# Patient Record
Sex: Female | Born: 1970 | Race: White | Hispanic: No | Marital: Married | Smoking: Former smoker
Health system: Southern US, Community
[De-identification: ages and names within clinical notes are randomized; demographics above are authoritative.]

## PROBLEM LIST (undated history)

## (undated) DIAGNOSIS — E669 Obesity, unspecified: Secondary | ICD-10-CM

## (undated) DIAGNOSIS — J329 Chronic sinusitis, unspecified: Secondary | ICD-10-CM

## (undated) DIAGNOSIS — M25511 Pain in right shoulder: Secondary | ICD-10-CM

## (undated) DIAGNOSIS — F32A Depression, unspecified: Secondary | ICD-10-CM

## (undated) DIAGNOSIS — F419 Anxiety disorder, unspecified: Secondary | ICD-10-CM

## (undated) DIAGNOSIS — M2550 Pain in unspecified joint: Secondary | ICD-10-CM

## (undated) DIAGNOSIS — J209 Acute bronchitis, unspecified: Principal | ICD-10-CM

## (undated) DIAGNOSIS — T7840XA Allergy, unspecified, initial encounter: Secondary | ICD-10-CM

## (undated) DIAGNOSIS — F329 Major depressive disorder, single episode, unspecified: Secondary | ICD-10-CM

## (undated) DIAGNOSIS — K5900 Constipation, unspecified: Secondary | ICD-10-CM

## (undated) DIAGNOSIS — Z8719 Personal history of other diseases of the digestive system: Secondary | ICD-10-CM

## (undated) DIAGNOSIS — Z9289 Personal history of other medical treatment: Secondary | ICD-10-CM

## (undated) DIAGNOSIS — G4700 Insomnia, unspecified: Secondary | ICD-10-CM

## (undated) DIAGNOSIS — R519 Headache, unspecified: Secondary | ICD-10-CM

## (undated) DIAGNOSIS — K219 Gastro-esophageal reflux disease without esophagitis: Secondary | ICD-10-CM

## (undated) DIAGNOSIS — D509 Iron deficiency anemia, unspecified: Secondary | ICD-10-CM

## (undated) DIAGNOSIS — Z98811 Dental restoration status: Secondary | ICD-10-CM

## (undated) DIAGNOSIS — I10 Essential (primary) hypertension: Secondary | ICD-10-CM

## (undated) DIAGNOSIS — M549 Dorsalgia, unspecified: Secondary | ICD-10-CM

## (undated) DIAGNOSIS — Z8632 Personal history of gestational diabetes: Secondary | ICD-10-CM

## (undated) DIAGNOSIS — R002 Palpitations: Secondary | ICD-10-CM

## (undated) DIAGNOSIS — R51 Headache: Secondary | ICD-10-CM

## (undated) DIAGNOSIS — F418 Other specified anxiety disorders: Secondary | ICD-10-CM

## (undated) DIAGNOSIS — J45909 Unspecified asthma, uncomplicated: Secondary | ICD-10-CM

## (undated) DIAGNOSIS — R600 Localized edema: Secondary | ICD-10-CM

## (undated) DIAGNOSIS — R739 Hyperglycemia, unspecified: Secondary | ICD-10-CM

## (undated) DIAGNOSIS — E282 Polycystic ovarian syndrome: Secondary | ICD-10-CM

## (undated) HISTORY — DX: Gastro-esophageal reflux disease without esophagitis: K21.9

## (undated) HISTORY — DX: Personal history of gestational diabetes: Z86.32

## (undated) HISTORY — DX: Allergy, unspecified, initial encounter: T78.40XA

## (undated) HISTORY — DX: Anxiety disorder, unspecified: F41.9

## (undated) HISTORY — DX: Obesity, unspecified: E66.9

## (undated) HISTORY — DX: Constipation, unspecified: K59.00

## (undated) HISTORY — DX: Hyperglycemia, unspecified: R73.9

## (undated) HISTORY — DX: Dorsalgia, unspecified: M54.9

## (undated) HISTORY — DX: Depression, unspecified: F32.A

## (undated) HISTORY — DX: Other specified anxiety disorders: F41.8

## (undated) HISTORY — PX: COLONOSCOPY: SHX174

## (undated) HISTORY — DX: Insomnia, unspecified: G47.00

## (undated) HISTORY — DX: Pain in right shoulder: M25.511

## (undated) HISTORY — DX: Unspecified asthma, uncomplicated: J45.909

## (undated) HISTORY — DX: Localized edema: R60.0

## (undated) HISTORY — DX: Pain in unspecified joint: M25.50

## (undated) HISTORY — DX: Polycystic ovarian syndrome: E28.2

## (undated) HISTORY — DX: Major depressive disorder, single episode, unspecified: F32.9

## (undated) HISTORY — PX: UPPER GI ENDOSCOPY: SHX6162

## (undated) HISTORY — PX: WISDOM TOOTH EXTRACTION: SHX21

## (undated) HISTORY — DX: Acute bronchitis, unspecified: J20.9

## (undated) HISTORY — DX: Palpitations: R00.2

---
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
## 1999-06-09 ENCOUNTER — Emergency Department (HOSPITAL_COMMUNITY): Admission: EM | Admit: 1999-06-09 | Discharge: 1999-06-09 | Payer: Self-pay | Admitting: Emergency Medicine

## 2000-07-19 ENCOUNTER — Emergency Department (HOSPITAL_COMMUNITY): Admission: EM | Admit: 2000-07-19 | Discharge: 2000-07-19 | Payer: Self-pay | Admitting: Emergency Medicine

## 2000-07-26 ENCOUNTER — Encounter: Admission: RE | Admit: 2000-07-26 | Discharge: 2000-08-23 | Payer: Self-pay | Admitting: Occupational Medicine

## 2003-11-19 ENCOUNTER — Other Ambulatory Visit: Admission: RE | Admit: 2003-11-19 | Discharge: 2003-11-19 | Payer: Self-pay | Admitting: Gynecology

## 2004-04-09 ENCOUNTER — Other Ambulatory Visit: Admission: RE | Admit: 2004-04-09 | Discharge: 2004-04-09 | Payer: Self-pay | Admitting: Gynecology

## 2004-08-12 ENCOUNTER — Encounter: Admission: RE | Admit: 2004-08-12 | Discharge: 2004-08-12 | Payer: Self-pay | Admitting: Gynecology

## 2004-10-23 ENCOUNTER — Inpatient Hospital Stay (HOSPITAL_COMMUNITY): Admission: AD | Admit: 2004-10-23 | Discharge: 2004-10-25 | Payer: Self-pay | Admitting: Gynecology

## 2004-10-26 ENCOUNTER — Encounter: Admission: RE | Admit: 2004-10-26 | Discharge: 2004-11-25 | Payer: Self-pay | Admitting: Gynecology

## 2004-12-04 ENCOUNTER — Other Ambulatory Visit: Admission: RE | Admit: 2004-12-04 | Discharge: 2004-12-04 | Payer: Self-pay | Admitting: Gynecology

## 2004-12-26 ENCOUNTER — Encounter: Admission: RE | Admit: 2004-12-26 | Discharge: 2005-01-25 | Payer: Self-pay | Admitting: Gynecology

## 2006-02-09 ENCOUNTER — Emergency Department (HOSPITAL_COMMUNITY): Admission: EM | Admit: 2006-02-09 | Discharge: 2006-02-09 | Payer: Self-pay | Admitting: Family Medicine

## 2006-03-30 ENCOUNTER — Other Ambulatory Visit: Admission: RE | Admit: 2006-03-30 | Discharge: 2006-03-30 | Payer: Self-pay | Admitting: Gynecology

## 2007-05-09 ENCOUNTER — Other Ambulatory Visit: Admission: RE | Admit: 2007-05-09 | Discharge: 2007-05-09 | Payer: Self-pay | Admitting: Gynecology

## 2008-01-06 ENCOUNTER — Inpatient Hospital Stay (HOSPITAL_COMMUNITY): Admission: AD | Admit: 2008-01-06 | Discharge: 2008-01-06 | Payer: Self-pay | Admitting: Obstetrics and Gynecology

## 2008-01-12 ENCOUNTER — Inpatient Hospital Stay (HOSPITAL_COMMUNITY): Admission: AD | Admit: 2008-01-12 | Discharge: 2008-01-14 | Payer: Self-pay | Admitting: Obstetrics and Gynecology

## 2008-01-18 ENCOUNTER — Encounter: Admission: RE | Admit: 2008-01-18 | Discharge: 2008-02-16 | Payer: Self-pay | Admitting: Obstetrics and Gynecology

## 2008-02-17 ENCOUNTER — Encounter: Admission: RE | Admit: 2008-02-17 | Discharge: 2008-02-23 | Payer: Self-pay | Admitting: Obstetrics and Gynecology

## 2008-10-21 ENCOUNTER — Emergency Department (HOSPITAL_COMMUNITY): Admission: EM | Admit: 2008-10-21 | Discharge: 2008-10-21 | Payer: Self-pay | Admitting: Family Medicine

## 2010-09-25 LAB — HM PAP SMEAR

## 2010-09-25 LAB — HM MAMMOGRAPHY

## 2010-11-06 LAB — POCT RAPID STREP A (OFFICE): Streptococcus, Group A Screen (Direct): NEGATIVE

## 2010-12-12 NOTE — H&P (Signed)
Connie Blanchard, Connie Blanchard             ACCOUNT NO.:  [id number]   MEDICAL RECORD NO.:  [id number]          PATIENT TYPE:  INP   LOCATION:  9171                          FACILITY:  [facility]   PHYSICIAN:  Juan H. Lily Peer, M.D.DATE OF BIRTH:  08-11-1970   DATE OF ADMISSION:  10/23/2004  DATE OF DISCHARGE:                                HISTORY & PHYSICAL   CHIEF COMPLAINT:  Ruptured membranes.   HISTORY:  The patient is a 40 year old gravida 2 para 0 AB 1; estimated date  of confinement of October 26, 2004; currently 39-and-a-half weeks estimated  gestational age. Presented to [facility] Gynecology office this morning  saying that she had rupture of membranes at approximately 9 a.m. this  morning, has had irregular contractions since the previous evening. Sterile  speculum examination demonstrated gross rupture of membranes, clear. Cervix  3 cm, 90% effaced, -3 station, and vertex. Review of the patient's prenatal  history demonstrated that due to her advanced maternal age she underwent a  genetic amniocentesis whereby normal chromosome studies were reported. The  patient did not want another assessment of the fetus until the time of the  delivery, but there was no chromosomal abnormality and her aminotic fluid  alpha-fetoprotein was reported to be normal as well. She had a urinary tract  infection early in her pregnancy and in her second half of pregnancy she had  an upper respiratory tract infection. She had declined cystic fibrosis  screening and she was treated for gestational diabetes on diet and was  followed with regular office visits and monitoring of her blood sugars and  her ultrasound screen had [redacted] weeks gestation and was reported to be normal  with no gross abnormality.   PAST MEDICAL HISTORY:  She is allergic to PENICILLIN and SULFA. Gestational  diabetes, diet controlled this pregnancy. She denied any other medical  problems with the exception that she has had history of genital  warts in the  past.   REVIEW OF SYSTEMS:  See [place] form.   PHYSICAL EXAMINATION:  VITAL SIGNS:  Blood pressure today was 120/70, urine  was negative for protein and glucose. Her weight was 230 pounds.  HEENT:  Unremarkable.  NECK:  Supple, trachea midline. No carotid bruits, no thyromegaly.  LUNGS:  Clear to auscultation without rhonchi or wheezes.  HEART:  Regular rate and rhythm. No murmurs or gallops.  BREAST:  Exam not done.  ABDOMEN:  Gravid uterus, vertex presentation by [employer]. Fundal  height 38.5 cm.  PELVIC:  Cervix 3 cm, 90% effaced, -2 to -3 station with gross rupture of  membranes and clear.  EXTREMITIES:  Trace edema. DTRs 1+, negative clonus.   PRENATAL LABORATORY DATA:  A positive blood type, negative antibody screen.  VDRL was nonreactive. Rubella immune. Hepatitis B surface antigen and HIV  were negative. Alpha-fetoprotein was normal and Pap smear was normal. GBS  positive. Abnormal diabetes screen and three-hour GTT.   ASSESSMENT:  A 40 year old gravida 2 para 0 abortus 1 at 39-and-a-half weeks  estimated gestational age with gross rupture of membranes at 9 a.m. this  morning. The patient with  positive GBS and allergic to PENICILLIN. She will  be placed on clindamycin per protocol. Will augment with Pitocin as  clinically indicated.   PLAN:  Admit as per above.      JHF/MEDQ  D:  10/23/2004  T:  10/23/2004  Job:  [id number]

## 2011-04-23 LAB — CBC
HCT: 30.3 — ABNORMAL LOW
Hemoglobin: 10.2 — ABNORMAL LOW
MCHC: 34.4
Platelets: 193
RBC: 3.43 — ABNORMAL LOW
RDW: 16.7 — ABNORMAL HIGH
RDW: 16.8 — ABNORMAL HIGH
WBC: 7.3

## 2011-04-23 LAB — RPR: RPR Ser Ql: NONREACTIVE

## 2011-05-06 ENCOUNTER — Ambulatory Visit: Payer: Self-pay | Admitting: Family Medicine

## 2011-07-29 ENCOUNTER — Encounter: Payer: Self-pay | Admitting: Family Medicine

## 2011-07-29 ENCOUNTER — Ambulatory Visit (INDEPENDENT_AMBULATORY_CARE_PROVIDER_SITE_OTHER): Payer: Commercial Managed Care - PPO | Admitting: Family Medicine

## 2011-07-29 VITALS — BP 134/75 | HR 71 | Temp 97.4°F | Ht 67.25 in | Wt 225.8 lb

## 2011-07-29 DIAGNOSIS — Z1211 Encounter for screening for malignant neoplasm of colon: Secondary | ICD-10-CM | POA: Insufficient documentation

## 2011-07-29 DIAGNOSIS — Z8632 Personal history of gestational diabetes: Secondary | ICD-10-CM | POA: Insufficient documentation

## 2011-07-29 DIAGNOSIS — O24419 Gestational diabetes mellitus in pregnancy, unspecified control: Secondary | ICD-10-CM

## 2011-07-29 DIAGNOSIS — F418 Other specified anxiety disorders: Secondary | ICD-10-CM | POA: Insufficient documentation

## 2011-07-29 DIAGNOSIS — O99810 Abnormal glucose complicating pregnancy: Secondary | ICD-10-CM

## 2011-07-29 DIAGNOSIS — Z0000 Encounter for general adult medical examination without abnormal findings: Secondary | ICD-10-CM

## 2011-07-29 DIAGNOSIS — Z23 Encounter for immunization: Secondary | ICD-10-CM

## 2011-07-29 DIAGNOSIS — K219 Gastro-esophageal reflux disease without esophagitis: Secondary | ICD-10-CM

## 2011-07-29 DIAGNOSIS — G4700 Insomnia, unspecified: Secondary | ICD-10-CM | POA: Insufficient documentation

## 2011-07-29 DIAGNOSIS — F329 Major depressive disorder, single episode, unspecified: Secondary | ICD-10-CM

## 2011-07-29 DIAGNOSIS — E669 Obesity, unspecified: Secondary | ICD-10-CM | POA: Insufficient documentation

## 2011-07-29 HISTORY — DX: Personal history of gestational diabetes: Z86.32

## 2011-07-29 HISTORY — DX: Encounter for screening for malignant neoplasm of colon: Z12.11

## 2011-07-29 LAB — RENAL FUNCTION PANEL
CO2: 29 mEq/L (ref 19–32)
Chloride: 106 mEq/L (ref 96–112)
GFR: 80.97 mL/min (ref >60.00)
Phosphorus: 3 mg/dL (ref 2.3–4.6)
Potassium: 4.1 mEq/L (ref 3.5–5.1)

## 2011-07-29 LAB — HEPATIC FUNCTION PANEL
AST: 21 U/L (ref 0–37)
Alkaline Phosphatase: 46 U/L (ref 39–117)
Bilirubin, Direct: 0 mg/dL (ref 0.0–0.3)

## 2011-07-29 LAB — LIPID PANEL
HDL: 58.9 mg/dL (ref >39.00)
LDL Cholesterol: 121 mg/dL — ABNORMAL HIGH (ref 0–99)
Total CHOL/HDL Ratio: 3
Triglycerides: 69 mg/dL (ref 0.0–149.0)
VLDL: 13.8 mg/dL (ref 0.0–40.0)

## 2011-07-29 LAB — CBC
MCHC: 32.7 g/dL (ref 30.0–36.0)
Platelets: 247 10*3/uL (ref 150.0–400.0)
RDW: 17.2 % — ABNORMAL HIGH (ref 11.5–14.6)
WBC: 5.3 10*3/uL (ref 4.5–10.5)

## 2011-07-29 MED ORDER — CITALOPRAM HYDROBROMIDE 10 MG PO TABS: 10.0000 mg | ORAL_TABLET | Freq: Every day | ORAL | Status: DC

## 2011-07-29 MED ORDER — LORAZEPAM 0.5 MG PO TABS: 0.5000 mg | ORAL_TABLET | Freq: Two times a day (BID) | ORAL | Status: AC | PRN

## 2011-07-29 NOTE — Assessment & Plan Note (Signed)
Has been on PPI for roughly 7 years. Started Nexium for reflux. He had a barium swallow at that time which showed some scarring per the patient in Connie Blanchard. She is asymptomatic presently on Nexium daily. She is asked to consider trying to take it every other day and titrating off as tolerated. She will maintain the medication at current dosing if symptoms return until seen again.

## 2011-07-29 NOTE — Patient Instructions (Signed)

## 2011-07-29 NOTE — Assessment & Plan Note (Addendum)
May try Benadryl or alprazolam when necessary and we will discuss more fully at next visit after labs are available.

## 2011-07-29 NOTE — Progress Notes (Signed)
Patient ID: Connie Blanchard, female   DOB: 10-Jun-1971, 41 y.o.   MRN: [id number] ALLEEN KEHM [id number] [birth year] 07/29/2011      Progress Note New Patient  Subjective  Chief Complaint  Chief Complaint  Patient presents with  . Establish Care    new patient    HPI  This is a 41 year old Caucasian female who is in today to establish care. She generally good health but has been struggling with depression on and off for many years at least into early 30s. She has a strong family history of mom had bipolar disorder and ultimately a committed suicide. Although she survived the initial attempt she died of a consultation by a blood clot resulting in PE. She also has a maternal aunt who tried to commit suicide and that a secondary pneumonia. She is not suicidal or manic but she does relate she has low mood and anhedonia. He struggles with insomnia and compulsive eating. He is struggling in her marriage slightly at this point and realizes she is over emotional and sometimes more quick tempered and she would like to be very guarded. Cries easily as well. Is ready to try medications at this time. Tried Effexor once in the past only took it for a week did not have any untoward side effects. She has tried Benadryl on occasion in the past for sleep and feels it may help slightly. She is also frustrated with her weight. Is not on any special diet at this time. She does take Nexium daily for some history of reflux but does not have any current symptoms and has not tried stopping the medication. 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 Offers no other acute complaints. Has not had any recent illness, fevers, chills, chest pain palpitations, shortness of breath, GI or GU complaints.  Past Medical History  Diagnosis Date  . GERD (gastroesophageal reflux disease)   . Depression     in the past  . Insomnia     for several years  . Obese   . Anemia     recurrent with pregnancies  . GDM  (gestational diabetes mellitus)     h/o    Past Surgical History  Procedure Date  . Dilation and curettage of uterus 1987  . Skin biopsy     on neck    Family History  Problem Relation Age of Onset  . Diabetes Mother     type 2  . Hypertension Mother   . Depression Mother   . Mental illness Mother     bi-polar  . Pulmonary embolism Mother   . Heart disease Maternal Grandfather   . Stroke Maternal Grandfather   . Other Father     drug addiction  . Depression Maternal Aunt 82    suicide attempt with pneumonia    History   Social History  . Marital Status: Married    Spouse Name: N/A    Number of Children: N/A  . Years of Education: N/A   Occupational History  . Not on file.   Social History Main Topics  . Smoking status: Former Smoker -- 1.0 packs/day for 19 years    Types: Cigarettes    Quit date: 07/28/2003  . Smokeless tobacco: Not on file  . Alcohol Use: Yes     occasionally  . Drug Use: No  . Sexually Active: Yes -- Female partner(s)   Other Topics Concern  . Not on file   Social History Narrative  .  No narrative on file    No current outpatient prescriptions on file prior to visit.    Allergies  Allergen Reactions  . Penicillins Rash  . Sulfa Antibiotics Rash    Review of Systems  Review of Systems  Constitutional: Negative for fever, chills and malaise/fatigue.  HENT: Negative for hearing loss, nosebleeds and congestion.   Eyes: Negative for discharge.  Respiratory: Negative for cough, sputum production, shortness of breath and wheezing.   Cardiovascular: Negative for chest pain, palpitations and leg swelling.  Gastrointestinal: Negative for heartburn, nausea, vomiting, abdominal pain, diarrhea, constipation and blood in stool.  Genitourinary: Negative for dysuria, urgency, frequency and hematuria.  Musculoskeletal: Negative for myalgias, back pain and falls.  Skin: Negative for rash.  Neurological: Negative for dizziness, tremors,  sensory change, focal weakness, loss of consciousness, weakness and headaches.  Endo/Heme/Allergies: Negative for polydipsia. Does not bruise/bleed easily.  Psychiatric/Behavioral: Positive for depression. Negative for suicidal ideas. The patient is not nervous/anxious and does not have insomnia.     Objective  BP 134/75  Pulse 71  Temp(Src) 97.4 F (36.3 C) (Temporal)  Ht 5' 7.25" (1.708 m)  Wt 225 lb 12.8 oz (102.422 kg)  BMI 35.10 kg/m2  SpO2 99%  LMP 07/10/2011  Physical Exam  Physical Exam  Constitutional: She is oriented to person, place, and time and well-developed, well-nourished, and in no distress. No distress.  HENT:  Head: Normocephalic and atraumatic.  Right Ear: External ear normal.  Left Ear: External ear normal.  Nose: Nose normal.  Mouth/Throat: Oropharynx is clear and moist. No oropharyngeal exudate.  Eyes: Conjunctivae are normal. Pupils are equal, round, and reactive to light. Right eye exhibits no discharge. Left eye exhibits no discharge. No scleral icterus.  Neck: Normal range of motion. Neck supple. No thyromegaly present.  Cardiovascular: Normal rate, regular rhythm, normal heart sounds and intact distal pulses.   No murmur heard. Pulmonary/Chest: Effort normal and breath sounds normal. No respiratory distress. She has no wheezes. She has no rales.  Abdominal: Soft. Bowel sounds are normal. She exhibits no distension and no mass. There is no tenderness.  Musculoskeletal: Normal range of motion. She exhibits no edema and no tenderness.  Lymphadenopathy:    She has no cervical adenopathy.  Neurological: She is alert and oriented to person, place, and time. She has normal reflexes. No cranial nerve deficit. Coordination normal.  Skin: Skin is warm and dry. No rash noted. She is not diaphoretic.  Psychiatric: Mood, memory and affect normal.       Assessment & Plan  Depression MDQ screen negative for Bipolar disorder with only 1 of 13 initial  questions answered yes, question #2 is no and Question # 3 is minor. She does have a family history of her mother having bipolar disorder. She presents as depression complicated by some life circumstance anxiety and some insomnia. We will start with citalopram 10 mg daily and she is warned regarding side effects. She is also given a small amount of alprazolam to use when necessary for insomnia and anxiety. Followup in 3 weeks or as needed. Consider counseling and medication changes as indicated.  GERD (gastroesophageal reflux disease) Has been on PPI for roughly 7 years. Started Nexium for reflux. He had a barium swallow at that time which showed some scarring per the patient in [facility]. She is asymptomatic presently on Nexium daily. She is asked to consider trying to take it every other day and titrating off as tolerated. She will maintain the medication  at current dosing if symptoms return until seen again.  GDM (gestational diabetes mellitus) We'll check fasting labs today including a renal panel to assess sugar  Obese Given paperwork on the DASH diet. Encouraged eat small frequent meals, avoid transacting increased activity reassess at next visit.  Insomnia May try Benadryl or alprazolam when necessary and we will discuss more fully at next visit after labs are available.  Preventative health care Agrees to Tdap and labs today will reevaluate at next visit. Discussed healthy lifestyle choices regarding diet, exercise and seat belt use

## 2011-07-29 NOTE — Assessment & Plan Note (Signed)
MDQ screen negative for Bipolar disorder with only 1 of 13 initial questions answered yes, question #2 is no and Question # 3 is minor. She does have a family history of her mother having bipolar disorder. She presents as depression complicated by some life circumstance anxiety and some insomnia. We will start with citalopram 10 mg daily and she is warned regarding side effects. She is also given a small amount of alprazolam to use when necessary for insomnia and anxiety. Followup in 3 weeks or as needed. Consider counseling and medication changes as indicated.

## 2011-07-29 NOTE — Assessment & Plan Note (Signed)
Agrees to Tdap and labs today will reevaluate at next visit. Discussed healthy lifestyle choices regarding diet, exercise and seat belt use

## 2011-07-29 NOTE — Assessment & Plan Note (Signed)
Given paperwork on the DASH diet. Encouraged eat small frequent meals, avoid transacting increased activity reassess at next visit.

## 2011-07-29 NOTE — Assessment & Plan Note (Signed)
We'll check fasting labs today including a renal panel to assess sugar

## 2011-08-21 ENCOUNTER — Ambulatory Visit: Payer: Commercial Managed Care - PPO | Admitting: Family Medicine

## 2011-08-25 ENCOUNTER — Encounter: Payer: Self-pay | Admitting: Family Medicine

## 2011-08-25 ENCOUNTER — Ambulatory Visit (INDEPENDENT_AMBULATORY_CARE_PROVIDER_SITE_OTHER): Payer: Commercial Managed Care - PPO | Admitting: Family Medicine

## 2011-08-25 VITALS — BP 128/79 | HR 71 | Temp 98.4°F | Ht 67.25 in | Wt 226.0 lb

## 2011-08-25 DIAGNOSIS — F329 Major depressive disorder, single episode, unspecified: Secondary | ICD-10-CM

## 2011-08-25 DIAGNOSIS — E785 Hyperlipidemia, unspecified: Secondary | ICD-10-CM | POA: Insufficient documentation

## 2011-08-25 DIAGNOSIS — D649 Anemia, unspecified: Secondary | ICD-10-CM

## 2011-08-25 DIAGNOSIS — E669 Obesity, unspecified: Secondary | ICD-10-CM

## 2011-08-25 DIAGNOSIS — K219 Gastro-esophageal reflux disease without esophagitis: Secondary | ICD-10-CM

## 2011-08-25 HISTORY — DX: Anemia, unspecified: D64.9

## 2011-08-25 MED ORDER — CITALOPRAM HYDROBROMIDE 10 MG PO TABS: 10.0000 mg | ORAL_TABLET | Freq: Every day | ORAL | Status: DC

## 2011-08-25 NOTE — Progress Notes (Signed)
Patient ID: Connie Blanchard, female   DOB: 1971-05-31, 41 y.o.   MRN: [id number] Connie Blanchard [id number] [birth year] 08/25/2011      Progress Note-Follow Up  Subjective  Chief Complaint  Chief Complaint  Patient presents with  . Follow-up    3 week follow up    HPI  Patient is a 41 year old Caucasian female who is in today for followup. She is doing much better than she was at her last visit. She is very pleased with her response to citalopram and noticed an improvement even after several days. She reports her mood has lifted she is less irritable. Her eating is improved and her insomnia is improved. Overall she feels she is doing better in her interpersonal relationships and she is pleased with her response. She's had no manic episodes she's had no difficulty with GI symptoms. Since her last visit she's not had any recent illness, fevers, headache, chest pain, palpitations, GU complaints. She did try titrating her Nexium back to every other day but her symptoms recur after one day of no medications. She has not tried dosing Pepcid thus far. She has reported exercising regularly but has not changed her diet in any significant way except to cut back somewhat.  Past Medical History  Diagnosis Date  . GERD (gastroesophageal reflux disease)   . Depression     in the past  . Insomnia     for several years  . Obese   . Anemia     recurrent with pregnancies  . GDM (gestational diabetes mellitus)     h/o  . Anemia 08/25/2011  . Hyperlipidemia 08/25/2011    Past Surgical History  Procedure Date  . Dilation and curettage of uterus 1987  . Skin biopsy     on neck    Family History  Problem Relation Age of Onset  . Diabetes Mother     type 2  . Hypertension Mother   . Depression Mother   . Mental illness Mother     bi-polar  . Pulmonary embolism Mother   . Heart disease Maternal Grandfather   . Stroke Maternal Grandfather   . Other Father     drug addiction  . Depression  Maternal Aunt 83    suicide attempt with pneumonia    History   Social History  . Marital Status: Married    Spouse Name: N/A    Number of Children: N/A  . Years of Education: N/A   Occupational History  . Not on file.   Social History Main Topics  . Smoking status: Former Smoker -- 1.0 packs/day for 19 years    Types: Cigarettes    Quit date: 07/28/2003  . Smokeless tobacco: Never Used  . Alcohol Use: Yes     occasionally  . Drug Use: No  . Sexually Active: Yes -- Female partner(s)   Other Topics Concern  . Not on file   Social History Narrative  . No narrative on file    Current Outpatient Prescriptions on File Prior to Visit  Medication Sig Dispense Refill  . b complex vitamins tablet Take 1 tablet by mouth daily.        Marland Kitchen esomeprazole (NEXIUM) 40 MG capsule Take 40 mg by mouth daily before breakfast.        . L-Arginine 500 MG CAPS Take 2 capsules by mouth daily as needed.        . Multiple Vitamin (MULTIVITAMIN) tablet Take 1 tablet by mouth daily.  Allergies  Allergen Reactions  . Penicillins Rash  . Sulfa Antibiotics Rash    Review of Systems  Review of Systems  Constitutional: Negative for fever and malaise/fatigue.  HENT: Negative for congestion.   Eyes: Negative for discharge.  Respiratory: Negative for shortness of breath.   Cardiovascular: Negative for chest pain, palpitations and leg swelling.  Gastrointestinal: Positive for heartburn. Negative for nausea, abdominal pain and diarrhea.  Genitourinary: Negative for dysuria.  Musculoskeletal: Negative for falls.  Skin: Negative for rash.  Neurological: Negative for loss of consciousness and headaches.  Endo/Heme/Allergies: Negative for polydipsia.  Psychiatric/Behavioral: Negative for depression and suicidal ideas. The patient is not nervous/anxious and does not have insomnia.     Objective  BP 128/79  Pulse 71  Temp(Src) 98.4 F (36.9 C) (Temporal)  Ht 5' 7.25" (1.708 m)  Wt 226  lb (102.513 kg)  BMI 35.13 kg/m2  SpO2 97%  LMP 08/19/2011  Physical Exam  Physical Exam  Constitutional: She is oriented to person, place, and time and well-developed, well-nourished, and in no distress. No distress.  HENT:  Head: Normocephalic and atraumatic.  Eyes: Conjunctivae are normal.  Neck: Neck supple. No thyromegaly present.  Cardiovascular: Normal rate, regular rhythm and normal heart sounds.   No murmur heard. Pulmonary/Chest: Effort normal and breath sounds normal. She has no wheezes.  Abdominal: She exhibits no distension and no mass.  Musculoskeletal: She exhibits no edema.  Lymphadenopathy:    She has no cervical adenopathy.  Neurological: She is alert and oriented to person, place, and time.  Skin: Skin is warm and dry. No rash noted. She is not diaphoretic.  Psychiatric: Memory, affect and judgment normal.    Lab Results  Component Value Date   TSH 2.00 07/29/2011   Lab Results  Component Value Date   WBC 5.3 07/29/2011   HGB 10.3* 07/29/2011   HCT 31.5* 07/29/2011   MCV 76.8* 07/29/2011   PLT 247.0 07/29/2011   Lab Results  Component Value Date   CREATININE 0.8 07/29/2011   BUN 11 07/29/2011   NA 140 07/29/2011   K 4.1 07/29/2011   CL 106 07/29/2011   CO2 29 07/29/2011   Lab Results  Component Value Date   ALT 19 07/29/2011   AST 21 07/29/2011   ALKPHOS 46 07/29/2011   BILITOT 0.2* 07/29/2011   Lab Results  Component Value Date   CHOL 194 07/29/2011   Lab Results  Component Value Date   HDL 58.90 07/29/2011   Lab Results  Component Value Date   LDLCALC 121* 07/29/2011   Lab Results  Component Value Date   TRIG 69.0 07/29/2011   Lab Results  Component Value Date   CHOLHDL 3 07/29/2011     Assessment & Plan  Depression Patient reports good response to Citalopram at just 10 mg daily, is given a refill and will call if any new concerns develop  GERD (gastroesophageal reflux disease) Patient has been unable to wean off the Nexium when she is off for even one day  symptoms recur. She will try alternating nexium and pepcid every other day and avoiding offending foods. Reassess at next visit, encouraged to start Citracal Petites 2 tabs po bid   Hyperlipidemia Very mild elevation of ldl cholesterol, reiterated need to avoid trans fats and minimize saturated fats, start MegaRed daily and continue a fiber supplement.  Obese Has started to exercise regularly but has not started the DASH diet, she is encouraged to consider this still  Anemia Depressed  HGB and MCV stable over several years, continue MVI daily and increase leafy greens, lean red meats, beans and consider cooking in cast iron. Recheck cbc with iron studies, Retic count in 3 months.

## 2011-08-25 NOTE — Patient Instructions (Addendum)
Hypertriglyceridemia  Diet for High blood levels of Triglycerides Most fats in food are triglycerides. Triglycerides in your blood are stored as fat in your body. High levels of triglycerides in your blood may put you at a greater risk for heart disease and stroke.  Normal triglyceride levels are less than 150 mg/dL. Borderline high levels are 150-199 mg/dl. High levels are 200 - 499 mg/dL, and very high triglyceride levels are greater than 500 mg/dL. The decision to treat high triglycerides is generally based on the level. For people with borderline or high triglyceride levels, treatment includes weight loss and exercise. Drugs are recommended for people with very high triglyceride levels. Many people who need treatment for high triglyceride levels have metabolic syndrome. This syndrome is a collection of disorders that often include: insulin resistance, high blood pressure, blood clotting problems, high cholesterol and triglycerides. TESTING PROCEDURE FOR TRIGLYCERIDES  You should not eat 4 hours before getting your triglycerides measured. The normal range of triglycerides is between 10 and 250 milligrams per deciliter (mg/dl). Some people may have extreme levels (1000 or above), but your triglyceride level may be too high if it is above 150 mg/dl, depending on what other risk factors you have for heart disease.   People with high blood triglycerides may also have high blood cholesterol levels. If you have high blood cholesterol as well as high blood triglycerides, your risk for heart disease is probably greater than if you only had high triglycerides. High blood cholesterol is one of the main risk factors for heart disease.  CHANGING YOUR DIET  Your weight can affect your blood triglyceride level. If you are more than 20% above your ideal body weight, you may be able to lower your blood triglycerides by losing weight. Eating less and exercising regularly is the best way to combat this. Fat provides  more calories than any other food. The best way to lose weight is to eat less fat. Only 30% of your total calories should come from fat. Less than 7% of your diet should come from saturated fat. A diet low in fat and saturated fat is the same as a diet to decrease blood cholesterol. By eating a diet lower in fat, you may lose weight, lower your blood cholesterol, and lower your blood triglyceride level.  Eating a diet low in fat, especially saturated fat, may also help you lower your blood triglyceride level. Ask your dietitian to help you figure how much fat you can eat based on the number of calories your caregiver has prescribed for you.  Exercise, in addition to helping with weight loss may also help lower triglyceride levels.   Alcohol can increase blood triglycerides. You may need to stop drinking alcoholic beverages.   Too much carbohydrate in your diet may also increase your blood triglycerides. Some complex carbohydrates are necessary in your diet. These may include bread, rice, potatoes, other starchy vegetables and cereals.   Reduce "simple" carbohydrates. These may include pure sugars, candy, honey, and jelly without losing other nutrients. If you have the kind of high blood triglycerides that is affected by the amount of carbohydrates in your diet, you will need to eat less sugar and less high-sugar foods. Your caregiver can help you with this.   Adding 2-4 grams of fish oil (EPA+ DHA) may also help lower triglycerides. Speak with your caregiver before adding any supplements to your regimen.  Following the Diet  Maintain your ideal weight. Your caregivers can help you with a diet. Generally,   eating less food and getting more exercise will help you lose weight. Joining a weight control group may also help. Ask your caregivers for a good weight control group in your area.  Eat low-fat foods instead of high-fat foods. This can help you lose weight too.  These foods are lower in fat. Eat MORE  of these:   Dried beans, peas, and lentils.   Egg whites.   Low-fat cottage cheese.   Fish.   Lean cuts of meat, such as round, sirloin, rump, and flank (cut extra fat off meat you fix).   Whole grain breads, cereals and pasta.   Skim and nonfat dry milk.   Low-fat yogurt.   Poultry without the skin.   Cheese made with skim or part-skim milk, such as mozzarella, parmesan, farmers', ricotta, or pot cheese.  These are higher fat foods. Eat LESS of these:   Whole milk and foods made from whole milk, such as American, blue, cheddar, monterey jack, and swiss cheese   High-fat meats, such as luncheon meats, sausages, knockwurst, bratwurst, hot dogs, ribs, corned beef, ground pork, and regular ground beef.   Fried foods.  Limit saturated fats in your diet. Substituting unsaturated fat for saturated fat may decrease your blood triglyceride level. You will need to read package labels to know which products contain saturated fats.  These foods are high in saturated fat. Eat LESS of these:   Fried pork skins.   Whole milk.   Skin and fat from poultry.   Palm oil.   Butter.   Shortening.   Cream cheese.   Bacon.   Margarines and baked goods made from listed oils.   Vegetable shortenings.   Chitterlings.   Fat from meats.   Coconut oil.   Palm kernel oil.   Lard.   Cream.   Sour cream.   Fatback.   Coffee whiteners and non-dairy creamers made with these oils.   Cheese made from whole milk.  Use unsaturated fats (both polyunsaturated and monounsaturated) moderately. Remember, even though unsaturated fats are better than saturated fats; you still want a diet low in total fat.  These foods are high in unsaturated fat:   Canola oil.   Sunflower oil.   Mayonnaise.   Almonds.   Peanuts.   Pine nuts.   Margarines made with these oils.   Safflower oil.   Olive oil.   Avocados.   Cashews.   Peanut butter.   Sunflower seeds.   Soybean oil.     Peanut oil.   Olives.   Pecans.   Walnuts.   Pumpkin seeds.  Avoid sugar and other high-sugar foods. This will decrease carbohydrates without decreasing other nutrients. Sugar in your food goes rapidly to your blood. When there is excess sugar in your blood, your liver may use it to make more triglycerides. Sugar also contains calories without other important nutrients.  Eat LESS of these:   Sugar, brown sugar, powdered sugar, jam, jelly, preserves, honey, syrup, molasses, pies, candy, cakes, cookies, frosting, pastries, colas, soft drinks, punches, fruit drinks, and regular gelatin.   Avoid alcohol. Alcohol, even more than sugar, may increase blood triglycerides. In addition, alcohol is high in calories and low in nutrients. Ask for sparkling water, or a diet soft drink instead of an alcoholic beverage.  Suggestions for planning and preparing meals   Bake, broil, grill or roast meats instead of frying.   Remove fat from meats and skin from poultry before cooking.   Add spices,   herbs, lemon juice or vinegar to vegetables instead of salt, rich sauces or gravies.   Use a non-stick skillet without fat or use no-stick sprays.   Cool and refrigerate stews and broth. Then remove the hardened fat floating on the surface before serving.   Refrigerate meat drippings and skim off fat to make low-fat gravies.   Serve more fish.   Use less butter, margarine and other high-fat spreads on bread or vegetables.   Use skim or reconstituted non-fat dry milk for cooking.   Cook with low-fat cheeses.   Substitute low-fat yogurt or cottage cheese for all or part of the sour cream in recipes for sauces, dips or congealed salads.   Use half yogurt/half mayonnaise in salad recipes.   Substitute evaporated skim milk for cream. Evaporated skim milk or reconstituted non-fat dry milk can be whipped and substituted for whipped cream in certain recipes.   Choose fresh fruits for dessert instead of  high-fat foods such as pies or cakes. Fruits are naturally low in fat.  When Dining Out   Order low-fat appetizers such as fruit or vegetable juice, pasta with vegetables or tomato sauce.   Select clear, rather than cream soups.   Ask that dressings and gravies be served on the side. Then use less of them.   Order foods that are baked, broiled, poached, steamed, stir-fried, or roasted.   Ask for margarine instead of butter, and use only a small amount.   Drink sparkling water, unsweetened tea or coffee, or diet soft drinks instead of alcohol or other sweet beverages.  QUESTIONS AND ANSWERS ABOUT OTHER FATS IN THE BLOOD: SATURATED FAT, TRANS FAT, AND CHOLESTEROL What is trans fat? Trans fat is a type of fat that is formed when vegetable oil is hardened through a process called hydrogenation. This process helps makes foods more solid, gives them shape, and prolongs their shelf life. Trans fats are also called hydrogenated or partially hydrogenated oils.  What do saturated fat, trans fat, and cholesterol in foods have to do with heart disease? Saturated fat, trans fat, and cholesterol in the diet all raise the level of LDL "bad" cholesterol in the blood. The higher the LDL cholesterol, the greater the risk for coronary heart disease (CHD). Saturated fat and trans fat raise LDL similarly.  What foods contain saturated fat, trans fat, and cholesterol? High amounts of saturated fat are found in animal products, such as fatty cuts of meat, chicken skin, and full-fat dairy products like butter, whole milk, cream, and cheese, and in tropical vegetable oils such as palm, palm kernel, and coconut oil. Trans fat is found in some of the same foods as saturated fat, such as vegetable shortening, some margarines (especially hard or stick margarine), crackers, cookies, baked goods, fried foods, salad dressings, and other processed foods made with partially hydrogenated vegetable oils. Small amounts of trans fat  also occur naturally in some animal products, such as milk products, beef, and lamb. Foods high in cholesterol include liver, other organ meats, egg yolks, shrimp, and full-fat dairy products. How can I use the new food label to make heart-healthy food choices? Check the Nutrition Facts panel of the food label. Choose foods lower in saturated fat, trans fat, and cholesterol. For saturated fat and cholesterol, you can also use the Percent Daily Value (%DV): 5% DV or less is low, and 20% DV or more is high. (There is no %DV for trans fat.) Use the Nutrition Facts panel to choose foods low in   saturated fat and cholesterol, and if the trans fat is not listed, read the ingredients and limit products that list shortening or hydrogenated or partially hydrogenated vegetable oil, which tend to be high in trans fat. POINTS TO REMEMBER: YOU NEED A LITTLE TLC (THERAPEUTIC LIFESTYLE CHANGES)  Discuss your risk for heart disease with your caregivers, and take steps to reduce risk factors.   Change your diet. Choose foods that are low in saturated fat, trans fat, and cholesterol.   Add exercise to your daily routine if it is not already being done. Participate in physical activity of moderate intensity, like brisk walking, for at least 30 minutes on most, and preferably all days of the week. No time? Break the 30 minutes into three, 10-minute segments during the day.   Stop smoking. If you do smoke, contact your caregiver to discuss ways in which they can help you quit.   Do not use street drugs.   Maintain a normal weight.   Maintain a healthy blood pressure.   Keep up with your blood work for checking the fats in your blood as directed by your caregiver.  Document Released: 04/30/2004 Document Revised: 03/25/2011 Document Reviewed: 11/26/2008 [facility] Patient Information 2012 [place], [place].  MegaRed caps 1 cap daily by Schiff  Increase leafy greens, red meats and beans or consider cooking in cast iron,  Take in some Vitamin C with iron  Alternate Nexium every other day with Pepcid every other day

## 2011-08-25 NOTE — Assessment & Plan Note (Signed)
Very mild elevation of ldl cholesterol, reiterated need to avoid trans fats and minimize saturated fats, start MegaRed daily and continue a fiber supplement.

## 2011-08-25 NOTE — Assessment & Plan Note (Signed)
Patient reports good response to Citalopram at just 10 mg daily, is given a refill and will call if any new concerns develop

## 2011-08-25 NOTE — Assessment & Plan Note (Signed)
Depressed HGB and MCV stable over several years, continue MVI daily and increase leafy greens, lean red meats, beans and consider cooking in cast iron. Recheck cbc with iron studies, Retic count in 3 months.

## 2011-08-25 NOTE — Assessment & Plan Note (Signed)
Has started to exercise regularly but has not started the DASH diet, she is encouraged to consider this still

## 2011-08-25 NOTE — Assessment & Plan Note (Signed)
Patient has been unable to wean off the Nexium when she is off for even one day symptoms recur. She will try alternating nexium and pepcid every other day and avoiding offending foods. Reassess at next visit, encouraged to start Citracal Petites 2 tabs po bid

## 2011-09-21 ENCOUNTER — Encounter: Payer: Self-pay | Admitting: Family Medicine

## 2011-09-21 ENCOUNTER — Ambulatory Visit (INDEPENDENT_AMBULATORY_CARE_PROVIDER_SITE_OTHER): Payer: Commercial Managed Care - PPO | Admitting: Family Medicine

## 2011-09-21 DIAGNOSIS — T7840XA Allergy, unspecified, initial encounter: Secondary | ICD-10-CM

## 2011-09-21 DIAGNOSIS — J329 Chronic sinusitis, unspecified: Secondary | ICD-10-CM | POA: Insufficient documentation

## 2011-09-21 MED ORDER — DOXYCYCLINE HYCLATE 100 MG PO TABS: 100.0000 mg | ORAL_TABLET | Freq: Two times a day (BID) | ORAL | Status: AC

## 2011-09-21 MED ORDER — CETIRIZINE HCL 10 MG PO TABS: 10.0000 mg | ORAL_TABLET | Freq: Every day | ORAL | Status: DC

## 2011-09-21 MED ORDER — SALINE SPRAY 0.65 % NA SOLN: 1.0000 | NASAL | Status: DC | PRN

## 2011-09-21 MED ORDER — GUAIFENESIN ER 600 MG PO TB12: 600.0000 mg | ORAL_TABLET | Freq: Two times a day (BID) | ORAL | Status: DC

## 2011-09-21 NOTE — Assessment & Plan Note (Addendum)
Doxycyline prescribed today she will use if if symptoms persist or worsen, encouraged increased hydration, rest and the addition of Mucinex as well.

## 2011-09-21 NOTE — Patient Instructions (Signed)

## 2011-09-22 NOTE — Progress Notes (Signed)
Patient ID: Connie Blanchard, female   DOB: February 12, 1971, 41 y.o.   MRN: [id number] HENLEE DONOVAN [id number] 1971/01/27 09/22/2011      Progress Note-Follow Up  Subjective  Chief Complaint  Chief Complaint  Patient presents with  . Sinusitis    sinus congestion w/ headache  . Cough    X 4 days w/ phlegm (yellow)    HPI  Patient is a 41 year old Caucasian female in today with four-day history of fatigue, malaise, myalgias, fevers, chills, nasal congestion, headache, sore throat, nausea, anorexia and. Denies vomiting or chest pain. No shortness of breath or wheezing. Has been using NyQuil and DayQuil with minimal improvement.  Past Medical History  Diagnosis Date  . GERD (gastroesophageal reflux disease)   . Depression     in the past  . Insomnia     for several years  . Obese   . Anemia     recurrent with pregnancies  . GDM (gestational diabetes mellitus)     h/o  . Anemia 08/25/2011  . Hyperlipidemia 08/25/2011  . Sinusitis 09/21/2011    Past Surgical History  Procedure Date  . Dilation and curettage of uterus 1987  . Skin biopsy     on neck    Family History  Problem Relation Age of Onset  . Diabetes Mother     type 2  . Hypertension Mother   . Depression Mother   . Mental illness Mother     bi-polar  . Pulmonary embolism Mother   . Heart disease Maternal Grandfather   . Stroke Maternal Grandfather   . Other Father     drug addiction  . Depression Maternal Aunt 27    suicide attempt with pneumonia    History   Social History  . Marital Status: Married    Spouse Name: N/A    Number of Children: N/A  . Years of Education: N/A   Occupational History  . Not on file.   Social History Main Topics  . Smoking status: Former Smoker -- 1.0 packs/day for 19 years    Types: Cigarettes    Quit date: 07/28/2003  . Smokeless tobacco: Never Used  . Alcohol Use: Yes     occasionally  . Drug Use: No  . Sexually Active: Yes -- Female partner(s)   Other Topics  Concern  . Not on file   Social History Narrative  . No narrative on file    Current Outpatient Prescriptions on File Prior to Visit  Medication Sig Dispense Refill  . b complex vitamins tablet Take 1 tablet by mouth daily.        . citalopram (CELEXA) 10 MG tablet Take 1 tablet (10 mg total) by mouth daily.  30 tablet  2  . esomeprazole (NEXIUM) 40 MG capsule Take 40 mg by mouth daily before breakfast.        . L-Arginine 500 MG CAPS Take 2 capsules by mouth daily as needed.        . Multiple Vitamin (MULTIVITAMIN) tablet Take 1 tablet by mouth daily.          Allergies  Allergen Reactions  . Penicillins Rash  . Sulfa Antibiotics Rash    Review of Systems  .Review of Systems  Constitutional: Positive for fever, chills and malaise/fatigue.  HENT: Positive for congestion and sore throat.   Eyes: Negative for discharge.  Respiratory: Positive for cough. Negative for shortness of breath.   Cardiovascular: Negative for chest pain, palpitations and leg swelling.  Gastrointestinal: Positive for nausea. Negative for vomiting, abdominal pain and diarrhea.  Genitourinary: Negative for dysuria.  Musculoskeletal: Positive for myalgias. Negative for falls.  Skin: Negative for rash.  Neurological: Positive for headaches. Negative for loss of consciousness.  Endo/Heme/Allergies: Negative for polydipsia.  Psychiatric/Behavioral: Negative for depression and suicidal ideas. The patient is not nervous/anxious and does not have insomnia.     Objective  BP 144/85  Pulse 77  Temp(Src) 98.4 F (36.9 C) (Temporal)  Ht 5' 7.25" (1.708 m)  Wt 222 lb 6.4 oz (100.88 kg)  BMI 34.57 kg/m2  SpO2 98%  LMP 09/16/2011  Physical Exam  Physical Exam  Constitutional: She is oriented to person, place, and time and well-developed, well-nourished, and in no distress. No distress.  HENT:  Head: Normocephalic and atraumatic.       Oropharynx erythematous, nasal mucosa is erythematous  Eyes:  Conjunctivae are normal.  Neck: Neck supple. No thyromegaly present.  Cardiovascular: Normal rate, regular rhythm and normal heart sounds.   No murmur heard. Pulmonary/Chest: Effort normal and breath sounds normal. She has no wheezes.  Abdominal: She exhibits no distension and no mass.  Musculoskeletal: She exhibits no edema.  Lymphadenopathy:    She has no cervical adenopathy.  Neurological: She is alert and oriented to person, place, and time.  Skin: Skin is warm and dry. No rash noted. She is not diaphoretic.  Psychiatric: Memory, affect and judgment normal.    Lab Results  Component Value Date   TSH 2.00 07/29/2011   Lab Results  Component Value Date   WBC 5.3 07/29/2011   HGB 10.3* 07/29/2011   HCT 31.5* 07/29/2011   MCV 76.8* 07/29/2011   PLT 247.0 07/29/2011   Lab Results  Component Value Date   CREATININE 0.8 07/29/2011   BUN 11 07/29/2011   NA 140 07/29/2011   K 4.1 07/29/2011   CL 106 07/29/2011   CO2 29 07/29/2011   Lab Results  Component Value Date   ALT 19 07/29/2011   AST 21 07/29/2011   ALKPHOS 46 07/29/2011   BILITOT 0.2* 07/29/2011   Lab Results  Component Value Date   CHOL 194 07/29/2011   Lab Results  Component Value Date   HDL 58.90 07/29/2011   Lab Results  Component Value Date   LDLCALC 121* 07/29/2011   Lab Results  Component Value Date   TRIG 69.0 07/29/2011   Lab Results  Component Value Date   CHOLHDL 3 07/29/2011     Assessment & Plan  Sinusitis Doxycyline prescribed today she will use if if symptoms persist or worsen, encouraged increased hydration, rest and the addition of Mucinex as well.

## 2011-10-06 ENCOUNTER — Other Ambulatory Visit: Payer: Self-pay

## 2011-10-06 MED ORDER — FLUCONAZOLE 150 MG PO TABS: ORAL_TABLET | ORAL | Status: DC

## 2011-10-06 NOTE — Telephone Encounter (Signed)
Pt was informed that RX was sent to pharmacy

## 2011-10-06 NOTE — Telephone Encounter (Signed)
Pt left a message stating that she would like to have Diflucan called into [facility]? Pt states that since she took the antibiotic she has got a yeast infection. Please advise?

## 2011-10-06 NOTE — Telephone Encounter (Signed)
OK to prescribe Diflucan 150 mg tabs 1 tab po q week x 2 weeks, disp #2

## 2011-10-15 ENCOUNTER — Telehealth: Payer: Self-pay | Admitting: *Deleted

## 2011-10-15 NOTE — Telephone Encounter (Signed)
Sure she can have a fsh and an lh added to her labs on 4/4 for fatigue but she has to know that there is a possiblity insurance will not cover the check fo for this reason

## 2011-10-15 NOTE — Telephone Encounter (Signed)
Pt would like hormone levels added to labs that will be drawn on 4/4.  Pt does not know which labs, but a friend (also having fatigue) just had hers drawn and they were low.  Pt does not know which levels were checked.  Pt wonders if alow hormone level(s) could help explain fatigue.   Advised pt that Dr. Abner Greenspan out of the office this afternoon and I would get her an answer tomorrow.  Pt voices understanding and is agreeable.

## 2011-10-16 NOTE — Telephone Encounter (Signed)
Pt notified on cell per DPR.

## 2011-10-29 ENCOUNTER — Other Ambulatory Visit: Payer: Self-pay | Admitting: Family Medicine

## 2011-10-29 ENCOUNTER — Other Ambulatory Visit: Payer: Commercial Managed Care - PPO

## 2011-10-29 LAB — CBC
HCT: 35.9 % — ABNORMAL LOW (ref 36.0–46.0)
Hemoglobin: 11.1 g/dL — ABNORMAL LOW (ref 12.0–15.0)
MCH: 24.8 pg — ABNORMAL LOW (ref 26.0–34.0)
MCHC: 30.9 g/dL (ref 30.0–36.0)
MCV: 80.1 fL (ref 78.0–100.0)
Platelets: 332 10*3/uL (ref 150–400)
RBC: 4.48 MIL/uL (ref 3.87–5.11)
RDW: 16.4 % — ABNORMAL HIGH (ref 11.5–15.5)
WBC: 7.7 10*3/uL (ref 4.0–10.5)

## 2011-10-29 LAB — RETICULOCYTES
ABS Retic: 71.7 10*3/uL (ref 19.0–186.0)
RBC.: 4.48 MIL/uL (ref 3.87–5.11)
Retic Ct Pct: 1.6 % (ref 0.4–2.3)

## 2011-10-30 LAB — LUTEINIZING HORMONE: LH: 5.3 m[IU]/mL

## 2011-10-30 LAB — FOLLICLE STIMULATING HORMONE: FSH: 6.2 m[IU]/mL

## 2011-10-30 LAB — IRON AND TIBC
%SAT: 7 % — ABNORMAL LOW (ref 20–55)
Iron: 26 ug/dL — ABNORMAL LOW (ref 42–145)
TIBC: 397 ug/dL (ref 250–470)
UIBC: 371 ug/dL (ref 125–400)

## 2011-11-03 ENCOUNTER — Telehealth: Payer: Self-pay | Admitting: Family Medicine

## 2011-11-03 MED ORDER — FERROUS FUMARATE 325 (106 FE) MG PO TABS: 1.0000 | ORAL_TABLET | Freq: Every day | ORAL | Status: DC

## 2011-11-03 NOTE — Telephone Encounter (Signed)
RX sent to pharmacy per patient's request.  

## 2011-11-24 ENCOUNTER — Encounter: Payer: Self-pay | Admitting: Family Medicine

## 2011-11-24 ENCOUNTER — Ambulatory Visit (INDEPENDENT_AMBULATORY_CARE_PROVIDER_SITE_OTHER): Payer: Commercial Managed Care - PPO | Admitting: Family Medicine

## 2011-11-24 VITALS — BP 143/87 | HR 91 | Temp 98.4°F | Ht 67.25 in | Wt 230.4 lb

## 2011-11-24 DIAGNOSIS — G4700 Insomnia, unspecified: Secondary | ICD-10-CM

## 2011-11-24 DIAGNOSIS — K219 Gastro-esophageal reflux disease without esophagitis: Secondary | ICD-10-CM

## 2011-11-24 DIAGNOSIS — E785 Hyperlipidemia, unspecified: Secondary | ICD-10-CM

## 2011-11-24 DIAGNOSIS — D649 Anemia, unspecified: Secondary | ICD-10-CM

## 2011-11-24 DIAGNOSIS — F329 Major depressive disorder, single episode, unspecified: Secondary | ICD-10-CM

## 2011-11-24 DIAGNOSIS — J329 Chronic sinusitis, unspecified: Secondary | ICD-10-CM

## 2011-11-24 DIAGNOSIS — E669 Obesity, unspecified: Secondary | ICD-10-CM

## 2011-11-24 MED ORDER — FERROUS FUMARATE 325 (106 FE) MG PO TABS: 1.0000 | ORAL_TABLET | Freq: Every day | ORAL | Status: DC

## 2011-11-24 MED ORDER — CITALOPRAM HYDROBROMIDE 10 MG PO TABS: 10.0000 mg | ORAL_TABLET | Freq: Every day | ORAL | Status: DC

## 2011-11-24 MED ORDER — ESOMEPRAZOLE MAGNESIUM 40 MG PO CPDR: 40.0000 mg | DELAYED_RELEASE_CAPSULE | Freq: Every day | ORAL | Status: DC

## 2011-11-24 MED ORDER — ALIGN PO CAPS: 1.0000 | ORAL_CAPSULE | Freq: Every day | ORAL | Status: DC

## 2011-11-24 NOTE — Assessment & Plan Note (Signed)
Has been following with ENT and has been treated several times recently with antibiotics, is considering surgical correction of septum. Encouraged her to start a probiotic daily due to frequent antibiotic use

## 2011-11-24 NOTE — Assessment & Plan Note (Signed)
Doing well on low dose Citalopram, given a 90 days supply, no changes

## 2011-11-24 NOTE — Assessment & Plan Note (Signed)
Mild, avoid trans fats, continue MegaRed daily, increase exercise

## 2011-11-24 NOTE — Assessment & Plan Note (Signed)
Continues to gain wait, recommend DASH diet, increased exercise, avoid trans fats and simple carbs. Discussed possibility of meds and patient declines for now

## 2011-11-24 NOTE — Progress Notes (Signed)
Patient ID: MARSELLA Blanchard, female   DOB: 02-14-1971, 41 y.o.   MRN: [id number] Connie Blanchard [id number] 1970/08/26 11/24/2011      Progress Note-Follow Up  Subjective  Chief Complaint  Chief Complaint  Patient presents with  . Follow-up    3 month    HPI  Patient is a 6 we[age] Caucasian female who is in today for followup. Overall she reports doing well. The Citalopram has been helpful and she feels it is an adequate dose. Gonia and stresses lifted somewhat. She is sleeping somewhat better. She denies any chest pain, palpitations, shortness of breath, GI or GU complaints. 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 She says she overall feels relatively well today without headache or significant congestion but may move forward in the near future.  Past Medical History  Diagnosis Date  . GERD (gastroesophageal reflux disease)   . Depression     in the past  . Insomnia     for several years  . Obese   . Anemia     recurrent with pregnancies  . GDM (gestational diabetes mellitus)     h/o  . Anemia 08/25/2011  . Hyperlipidemia 08/25/2011  . Sinusitis 09/21/2011    Past Surgical History  Procedure Date  . Dilation and curettage of uterus 1987  . Skin biopsy     on neck    Family History  Problem Relation Age of Onset  . Diabetes Mother     type 2  . Hypertension Mother   . Depression Mother   . Mental illness Mother     bi-polar  . Pulmonary embolism Mother   . Heart disease Maternal Grandfather   . Stroke Maternal Grandfather   . Other Father     drug addiction  . Depression Maternal Aunt 63    suicide attempt with pneumonia    History   Social History  . Marital Status: Married    Spouse Name: N/A    Number of Children: N/A  . Years of Education: N/A   Occupational History  . Not on file.   Social History Main Topics  . Smoking status: Former Smoker -- 1.0 packs/day  for 19 years    Types: Cigarettes    Quit date: 07/28/2003  . Smokeless tobacco: Never Used  . Alcohol Use: Yes     occasionally  . Drug Use: No  . Sexually Active: Yes -- Female partner(s)   Other Topics Concern  . Not on file   Social History Narrative  . No narrative on file    Current Outpatient Prescriptions on File Prior to Visit  Medication Sig Dispense Refill  . b complex vitamins tablet Take 1 tablet by mouth daily.        Marland Kitchen L-Arginine 500 MG CAPS Take 2 capsules by mouth daily as needed.        . Multiple Vitamin (MULTIVITAMIN) tablet Take 1 tablet by mouth daily.        . sodium chloride (OCEAN) 0.65 % SOLN nasal spray Place 1 spray into the nose as needed for congestion.  1 Bottle  2  . DISCONTD: citalopram (CELEXA) 10 MG tablet Take 1 tablet (10 mg total) by mouth daily.  30 tablet  2  . DISCONTD: esomeprazole (NEXIUM) 40 MG capsule Take 40 mg by mouth daily before breakfast.        . DISCONTD: ferrous fumarate (HEMOCYTE -  106 MG FE) 325 (106 FE) MG TABS Take 1 tablet (106 mg of iron total) by mouth daily.  30 each  1    Allergies  Allergen Reactions  . Penicillins Rash  . Sulfa Antibiotics Rash    Review of Systems  Review of Systems  Constitutional: Negative for fever and malaise/fatigue.  HENT: Negative for congestion.   Eyes: Negative for discharge.  Respiratory: Negative for shortness of breath.   Cardiovascular: Negative for chest pain, palpitations and leg swelling.  Gastrointestinal: Negative for nausea, abdominal pain and diarrhea.  Genitourinary: Negative for dysuria.  Musculoskeletal: Negative for falls.  Skin: Negative for rash.  Neurological: Negative for loss of consciousness and headaches.  Endo/Heme/Allergies: Negative for polydipsia.  Psychiatric/Behavioral: Negative for depression and suicidal ideas. The patient is not nervous/anxious and does not have insomnia.     Objective  BP 143/87  Pulse 91  Temp(Src) 98.4 F (36.9 C)  (Temporal)  Ht 5' 7.25" (1.708 m)  Wt 230 lb 6.4 oz (104.509 kg)  BMI 35.82 kg/m2  SpO2 97%  LMP 11/15/2011  Physical Exam  Physical Exam  Constitutional: She is oriented to person, place, and time and well-developed, well-nourished, and in no distress. No distress.  HENT:  Head: Normocephalic and atraumatic.  Eyes: Conjunctivae are normal.  Neck: Neck supple. No thyromegaly present.  Cardiovascular: Normal rate, regular rhythm and normal heart sounds.   No murmur heard. Pulmonary/Chest: Effort normal and breath sounds normal. She has no wheezes.  Abdominal: She exhibits no distension and no mass.  Musculoskeletal: She exhibits no edema.  Lymphadenopathy:    She has no cervical adenopathy.  Neurological: She is alert and oriented to person, place, and time.  Skin: Skin is warm and dry. No rash noted. She is not diaphoretic.  Psychiatric: Memory, affect and judgment normal.    Lab Results  Component Value Date   TSH 2.00 07/29/2011   Lab Results  Component Value Date   WBC 7.7 10/29/2011   HGB 11.1* 10/29/2011   HCT 35.9* 10/29/2011   MCV 80.1 10/29/2011   PLT 332 10/29/2011   Lab Results  Component Value Date   CREATININE 0.8 07/29/2011   BUN 11 07/29/2011   NA 140 07/29/2011   K 4.1 07/29/2011   CL 106 07/29/2011   CO2 29 07/29/2011   Lab Results  Component Value Date   ALT 19 07/29/2011   AST 21 07/29/2011   ALKPHOS 46 07/29/2011   BILITOT 0.2* 07/29/2011   Lab Results  Component Value Date   CHOL 194 07/29/2011   Lab Results  Component Value Date   HDL 58.90 07/29/2011   Lab Results  Component Value Date   LDLCALC 121* 07/29/2011   Lab Results  Component Value Date   TRIG 69.0 07/29/2011   Lab Results  Component Value Date   CHOLHDL 3 07/29/2011     Assessment & Plan  Obese Continues to gain wait, recommend DASH diet, increased exercise, avoid trans fats and simple carbs. Discussed possibility of meds and patient declines for now  Anemia Mild improved, encouraged increased  iron intake, given refill on Hemocyte and recheck labs in 6 months  Hyperlipidemia Mild, avoid trans fats, continue MegaRed daily, increase exercise  Sinusitis Has been following with ENT and has been treated several times recently with antibiotics, is considering surgical correction of septum. Encouraged her to start a probiotic daily due to frequent antibiotic use  Insomnia Improved with recent changes  Depression Doing well on low  dose Citalopram, given a 90 days supply, no changes

## 2011-11-24 NOTE — Assessment & Plan Note (Signed)
Improved with recent changes.  

## 2011-11-24 NOTE — Assessment & Plan Note (Signed)
Mild improved, encouraged increased iron intake, given refill on Hemocyte and recheck labs in 6 months

## 2011-11-24 NOTE — Patient Instructions (Signed)

## 2011-12-01 ENCOUNTER — Other Ambulatory Visit (HOSPITAL_COMMUNITY): Payer: Self-pay | Admitting: Otolaryngology

## 2011-12-08 ENCOUNTER — Other Ambulatory Visit (HOSPITAL_COMMUNITY): Payer: Self-pay | Admitting: Otolaryngology

## 2011-12-08 ENCOUNTER — Ambulatory Visit (HOSPITAL_COMMUNITY)
Admission: RE | Admit: 2011-12-08 | Discharge: 2011-12-08 | Disposition: A | Discharge status: Home / Self Care | Payer: 59 | Source: Ambulatory Visit | Attending: Otolaryngology | Admitting: Otolaryngology

## 2011-12-08 DIAGNOSIS — J329 Chronic sinusitis, unspecified: Secondary | ICD-10-CM | POA: Insufficient documentation

## 2012-02-04 ENCOUNTER — Telehealth: Payer: Self-pay | Admitting: *Deleted

## 2012-02-04 NOTE — Telephone Encounter (Signed)
Have her take 1/2 tab daily for 1-2 weeks and then stop

## 2012-02-04 NOTE — Telephone Encounter (Signed)
Patient reports that she wishes to D/C Celexa, as previously discussed, due to continued weight gain; request instructions to wean herself off of medication/SLS Please advise.

## 2012-02-04 NOTE — Telephone Encounter (Signed)
LMOM with contact name & number to inform patient & for return call if any questions concerning MD instructions/SLS

## 2012-02-25 DIAGNOSIS — K219 Gastro-esophageal reflux disease without esophagitis: Secondary | ICD-10-CM

## 2012-02-25 DIAGNOSIS — J329 Chronic sinusitis, unspecified: Secondary | ICD-10-CM | POA: Insufficient documentation

## 2012-02-25 HISTORY — DX: Gastro-esophageal reflux disease without esophagitis: K21.9

## 2012-02-25 HISTORY — DX: Chronic sinusitis, unspecified: J32.9

## 2012-03-17 ENCOUNTER — Encounter (HOSPITAL_BASED_OUTPATIENT_CLINIC_OR_DEPARTMENT_OTHER): Payer: Self-pay | Admitting: *Deleted

## 2012-03-23 ENCOUNTER — Encounter (HOSPITAL_BASED_OUTPATIENT_CLINIC_OR_DEPARTMENT_OTHER): Payer: Self-pay | Admitting: Anesthesiology

## 2012-03-23 ENCOUNTER — Ambulatory Visit (HOSPITAL_BASED_OUTPATIENT_CLINIC_OR_DEPARTMENT_OTHER)
Admission: RE | Admit: 2012-03-23 | Discharge: 2012-03-23 | Disposition: A | Discharge status: Home / Self Care | Payer: 59 | Source: Ambulatory Visit | Attending: Otolaryngology | Admitting: Otolaryngology

## 2012-03-23 ENCOUNTER — Encounter (HOSPITAL_BASED_OUTPATIENT_CLINIC_OR_DEPARTMENT_OTHER)
Admission: RE | Disposition: A | Discharge status: Home / Self Care | Payer: Self-pay | Source: Ambulatory Visit | Attending: Otolaryngology

## 2012-03-23 ENCOUNTER — Encounter (HOSPITAL_BASED_OUTPATIENT_CLINIC_OR_DEPARTMENT_OTHER): Payer: Self-pay | Admitting: *Deleted

## 2012-03-23 ENCOUNTER — Ambulatory Visit (HOSPITAL_BASED_OUTPATIENT_CLINIC_OR_DEPARTMENT_OTHER): Payer: 59 | Admitting: Anesthesiology

## 2012-03-23 DIAGNOSIS — D509 Iron deficiency anemia, unspecified: Secondary | ICD-10-CM | POA: Insufficient documentation

## 2012-03-23 DIAGNOSIS — K219 Gastro-esophageal reflux disease without esophagitis: Secondary | ICD-10-CM | POA: Insufficient documentation

## 2012-03-23 DIAGNOSIS — E669 Obesity, unspecified: Secondary | ICD-10-CM | POA: Insufficient documentation

## 2012-03-23 DIAGNOSIS — J343 Hypertrophy of nasal turbinates: Secondary | ICD-10-CM | POA: Insufficient documentation

## 2012-03-23 DIAGNOSIS — J320 Chronic maxillary sinusitis: Secondary | ICD-10-CM | POA: Insufficient documentation

## 2012-03-23 DIAGNOSIS — J329 Chronic sinusitis, unspecified: Secondary | ICD-10-CM

## 2012-03-23 DIAGNOSIS — J342 Deviated nasal septum: Secondary | ICD-10-CM | POA: Insufficient documentation

## 2012-03-23 DIAGNOSIS — J322 Chronic ethmoidal sinusitis: Secondary | ICD-10-CM | POA: Insufficient documentation

## 2012-03-23 HISTORY — DX: Chronic sinusitis, unspecified: J32.9

## 2012-03-23 HISTORY — PX: SINUS ENDO W/FUSION: SHX777

## 2012-03-23 HISTORY — DX: Iron deficiency anemia, unspecified: D50.9

## 2012-03-23 HISTORY — DX: Dental restoration status: Z98.811

## 2012-03-23 HISTORY — PX: NASAL SEPTOPLASTY W/ TURBINOPLASTY: SHX2070

## 2012-03-23 SURGERY — SEPTOPLASTY, NOSE, WITH NASAL TURBINATE REDUCTION
Anesthesia: General | Site: Nose | Laterality: Bilateral | Wound class: Clean Contaminated

## 2012-03-23 MED ORDER — MIDAZOLAM HCL 5 MG/5ML IJ SOLN
INTRAMUSCULAR | Status: DC | PRN
  Administered 2012-03-23: 2 mg via INTRAVENOUS

## 2012-03-23 MED ORDER — FENTANYL CITRATE 0.05 MG/ML IJ SOLN
INTRAMUSCULAR | Status: DC | PRN
  Administered 2012-03-23: 100 ug via INTRAVENOUS

## 2012-03-23 MED ORDER — DEXAMETHASONE SODIUM PHOSPHATE 4 MG/ML IJ SOLN
INTRAMUSCULAR | Status: DC | PRN
  Administered 2012-03-23: 10 mg via INTRAVENOUS

## 2012-03-23 MED ORDER — LIDOCAINE HCL (CARDIAC) 20 MG/ML IV SOLN
INTRAVENOUS | Status: DC | PRN
  Administered 2012-03-23: 50 mg via INTRAVENOUS

## 2012-03-23 MED ORDER — MIDAZOLAM HCL 2 MG/2ML IJ SOLN: 0.5000 mg | Freq: Once | INTRAMUSCULAR | Status: DC | PRN

## 2012-03-23 MED ORDER — MUPIROCIN 2 % EX OINT
TOPICAL_OINTMENT | CUTANEOUS | Status: DC | PRN
  Administered 2012-03-23: 1 via NASAL

## 2012-03-23 MED ORDER — ONDANSETRON HCL 4 MG/2ML IJ SOLN
INTRAMUSCULAR | Status: DC | PRN
  Administered 2012-03-23: 4 mg via INTRAVENOUS

## 2012-03-23 MED ORDER — CIPROFLOXACIN IN D5W 400 MG/200ML IV SOLN
INTRAVENOUS | Status: DC | PRN
  Administered 2012-03-23: 400 mg via INTRAVENOUS

## 2012-03-23 MED ORDER — LACTATED RINGERS IV SOLN
INTRAVENOUS | Status: DC
  Administered 2012-03-23 (×3): via INTRAVENOUS

## 2012-03-23 MED ORDER — HYDROMORPHONE HCL PF 1 MG/ML IJ SOLN
0.2500 mg | INTRAMUSCULAR | Status: DC | PRN
  Administered 2012-03-23: 0.5 mg via INTRAVENOUS

## 2012-03-23 MED ORDER — MEPERIDINE HCL 25 MG/ML IJ SOLN: 6.2500 mg | INTRAMUSCULAR | Status: DC | PRN

## 2012-03-23 MED ORDER — PROPOFOL 10 MG/ML IV BOLUS
INTRAVENOUS | Status: DC | PRN
  Administered 2012-03-23: 300 mg via INTRAVENOUS

## 2012-03-23 MED ORDER — LIDOCAINE-EPINEPHRINE 1 %-1:100000 IJ SOLN
INTRAMUSCULAR | Status: DC | PRN
  Administered 2012-03-23: 8 mL

## 2012-03-23 MED ORDER — SUCCINYLCHOLINE CHLORIDE 20 MG/ML IJ SOLN
INTRAMUSCULAR | Status: DC | PRN
  Administered 2012-03-23: 100 mg via INTRAVENOUS

## 2012-03-23 MED ORDER — PROMETHAZINE HCL 25 MG/ML IJ SOLN: 6.2500 mg | INTRAMUSCULAR | Status: DC | PRN

## 2012-03-23 MED ORDER — HYDROCODONE-ACETAMINOPHEN 5-325 MG PO TABS: 1.0000 | ORAL_TABLET | Freq: Four times a day (QID) | ORAL | Status: AC | PRN

## 2012-03-23 MED ORDER — OXYMETAZOLINE HCL 0.05 % NA SOLN
NASAL | Status: DC | PRN
  Administered 2012-03-23: 1 via NASAL

## 2012-03-23 MED ORDER — HYDROCODONE-ACETAMINOPHEN 5-325 MG PO TABS
1.0000 | ORAL_TABLET | Freq: Four times a day (QID) | ORAL | Status: DC | PRN
  Administered 2012-03-23: 1 via ORAL

## 2012-03-23 SURGICAL SUPPLY — 71 items
ATTRACTOMAT 16X20 MAGNETIC DRP (DRAPES) IMPLANT
BALLOON SINUPLASTY SYSTEM ×2 IMPLANT
BLADE RAD40 ROTATE 4M 4 5PK (BLADE) IMPLANT
BLADE RAD60 ROTATE M4 4 5PK (BLADE) IMPLANT
BLADE ROTATE RAD 12 4 M4 (BLADE) ×2 IMPLANT
BLADE ROTATE RAD 40 4 M4 (BLADE) ×2 IMPLANT
BLADE ROTATE RAD12 5PK M4 4MM (BLADE) IMPLANT
BLADE ROTATE TRICUT 4X13 M4 (BLADE) ×2 IMPLANT
BLADE SURG 15 STRL LF DISP TIS (BLADE) ×1 IMPLANT
BLADE SURG 15 STRL SS (BLADE) ×1
BLADE TRICUT ROTATE M4 4 5PK (BLADE) IMPLANT
BUR HS RAD FRONTAL 3 (BURR) IMPLANT
CANISTER SUC SOCK COL 7 IN (MISCELLANEOUS) ×2 IMPLANT
CANISTER SUCTION 1200CC (MISCELLANEOUS) ×4 IMPLANT
CATH SINUS BALLN 7X16 (CATHETERS) IMPLANT
CATH SINUS BALLN RELIEV 6X16 (SINUPLASTY) IMPLANT
CATH SINUS GUIDE F-70 (CATHETERS) IMPLANT
CATH SINUS GUIDE M/110 (CATHETERS) IMPLANT
CATH SINUS IRRIGATION 2.0 (CATHETERS) IMPLANT
CLOTH BEACON ORANGE TIMEOUT ST (SAFETY) ×2 IMPLANT
COAGULATOR SUCT SWTCH 10FR 6 (ELECTROSURGICAL) IMPLANT
COVER PROBE W GEL 5X96 (DRAPES) ×2 IMPLANT
DECANTER SPIKE VIAL GLASS SM (MISCELLANEOUS) ×2 IMPLANT
DEVICE INFLATION 20/61 (MISCELLANEOUS) ×2 IMPLANT
DRAPE SURG 17X23 STRL (DRAPES) IMPLANT
DRESSING NASAL KENNEDY 3.5X.9 (MISCELLANEOUS) ×1 IMPLANT
DRSG NASAL KENNEDY 3.5X.9 (MISCELLANEOUS) ×2
DRSG NASOPORE 8CM (GAUZE/BANDAGES/DRESSINGS) IMPLANT
DRSG TELFA 3X8 NADH (GAUZE/BANDAGES/DRESSINGS) IMPLANT
ELECT COATED BLADE 2.86 ST (ELECTRODE) IMPLANT
ELECT REM PT RETURN 9FT ADLT (ELECTROSURGICAL) ×2
ELECTRODE REM PT RTRN 9FT ADLT (ELECTROSURGICAL) ×1 IMPLANT
GLOVE BIOGEL M 7.0 STRL (GLOVE) ×4 IMPLANT
GOWN BRE IMP SLV SIRUS LXLNG (GOWN DISPOSABLE) IMPLANT
GOWN PREVENTION PLUS XLARGE (GOWN DISPOSABLE) ×4 IMPLANT
HANDLE SINUS GUIDE LP (INSTRUMENTS) IMPLANT
HANDPIECE HYDRODEBRIDER FRONT (BLADE) IMPLANT
IV NS 1000ML (IV SOLUTION)
IV NS 1000ML BAXH (IV SOLUTION) IMPLANT
IV NS 500ML (IV SOLUTION) ×1
IV NS 500ML BAXH (IV SOLUTION) ×1 IMPLANT
NEEDLE 27GAX1X1/2 (NEEDLE) ×2 IMPLANT
NS IRRIG 1000ML POUR BTL (IV SOLUTION) ×2 IMPLANT
PACK ARTHROSCOPY DSU (CUSTOM PROCEDURE TRAY) IMPLANT
PACK BASIN DAY SURGERY FS (CUSTOM PROCEDURE TRAY) ×2 IMPLANT
PACK ENT DAY SURGERY (CUSTOM PROCEDURE TRAY) ×2 IMPLANT
PAD ENT ADHESIVE 25PK (MISCELLANEOUS) ×2 IMPLANT
PENCIL BUTTON HOLSTER BLD 10FT (ELECTRODE) IMPLANT
SET EXT MALE ROTATING LL 32IN (MISCELLANEOUS) ×2 IMPLANT
SLEEVE SCD COMPRESS KNEE MED (MISCELLANEOUS) ×2 IMPLANT
SOLUTION BUTLER CLEAR DIP (MISCELLANEOUS) ×2 IMPLANT
SPLINT NASAL DOYLE BI-VL (GAUZE/BANDAGES/DRESSINGS) ×2 IMPLANT
SPONGE GAUZE 2X2 8PLY STRL LF (GAUZE/BANDAGES/DRESSINGS) ×2 IMPLANT
SPONGE NEURO XRAY DETECT 1X3 (DISPOSABLE) ×2 IMPLANT
SPONGE SURGIFOAM ABS GEL 12-7 (HEMOSTASIS) IMPLANT
STRIP CLOSURE SKIN 1/2X4 (GAUZE/BANDAGES/DRESSINGS) ×2 IMPLANT
SUT ETHILON 3 0 PS 1 (SUTURE) ×2 IMPLANT
SUT PLAIN 4 0 ~~LOC~~ 1 (SUTURE) ×2 IMPLANT
SUT SILK 3 0 PS 1 (SUTURE) IMPLANT
SYR 3ML 23GX1 SAFETY (SYRINGE) IMPLANT
SYSTEM HYDRODEBRIDER (MISCELLANEOUS) IMPLANT
SYSTEM RELIEVA LUMA ILLUM (SINUPLASTY) IMPLANT
TOWEL OR 17X24 6PK STRL BLUE (TOWEL DISPOSABLE) ×4 IMPLANT
TRACKER ENT INSTRUMENT (MISCELLANEOUS) ×2 IMPLANT
TRACKER ENT PATIENT (MISCELLANEOUS) ×2 IMPLANT
TUBE CONNECTING 20X1/4 (TUBING) ×2 IMPLANT
TUBE SALEM SUMP 12R W/ARV (TUBING) IMPLANT
TUBE SALEM SUMP 16 FR W/ARV (TUBING) ×2 IMPLANT
TUBING STRAIGHTSHOT EPS 5PK (TUBING) ×2 IMPLANT
WATER STERILE IRR 1000ML POUR (IV SOLUTION) ×4 IMPLANT
YANKAUER SUCT BULB TIP NO VENT (SUCTIONS) ×2 IMPLANT

## 2012-03-23 NOTE — Anesthesia Preprocedure Evaluation (Signed)
Anesthesia Evaluation  Patient identified by MRN, date of birth, ID band Patient awake    Reviewed: Allergy & Precautions, H&P , NPO status , Patient's Chart, lab work & pertinent test results  History of Anesthesia Complications Negative for: history of anesthetic complications  Airway Mallampati: I TM Distance: >3 FB Neck ROM: Full    Dental No notable dental hx. (+) Teeth Intact and Dental Advisory Given   Pulmonary neg pulmonary ROS,  breath sounds clear to auscultation  Pulmonary exam normal       Cardiovascular negative cardio ROS  Rhythm:Regular Rate:Normal     Neuro/Psych negative neurological ROS     GI/Hepatic Neg liver ROS, GERD-  Medicated and Controlled,  Endo/Other  Morbid obesity  Renal/GU negative Renal ROS     Musculoskeletal   Abdominal (+) + obese,   Peds  Hematology   Anesthesia Other Findings   Reproductive/Obstetrics LMP presently                           Anesthesia Physical Anesthesia Plan  ASA: II  Anesthesia Plan: General   Post-op Pain Management:    Induction: Intravenous  Airway Management Planned: Oral ETT  Additional Equipment:   Intra-op Plan:   Post-operative Plan: Extubation in OR  Informed Consent: I have reviewed the patients History and Physical, chart, labs and discussed the procedure including the risks, benefits and alternatives for the proposed anesthesia with the patient or authorized representative who has indicated his/her understanding and acceptance.   Dental advisory given  Plan Discussed with: CRNA and Surgeon  Anesthesia Plan Comments: (Plan routine monitors, GETA)        Anesthesia Quick Evaluation

## 2012-03-23 NOTE — Transfer of Care (Signed)
Immediate Anesthesia Transfer of Care Note  Patient: Connie Blanchard  Procedure(s) Performed: Procedure(s) (LRB): NASAL SEPTOPLASTY WITH TURBINATE REDUCTION (Bilateral) ENDOSCOPIC SINUS SURGERY WITH FUSION NAVIGATION (Bilateral)  Patient Location: PACU  Anesthesia Type: General  Level of Consciousness: awake, alert  and oriented  Airway & Oxygen Therapy: Patient Spontanous Breathing and Patient connected to face mask oxygen  Post-op Assessment: Report given to PACU RN and Post -op Vital signs reviewed and stable  Post vital signs: Reviewed and stable  Complications: No apparent anesthesia complications

## 2012-03-23 NOTE — H&P (Signed)
Connie Blanchard is an 41 y.o. female.   Chief Complaint: Chronic Sinusitis and Dev. Septum HPI: chronic recurrent infection and nasal obstruction  Past Medical History  Diagnosis Date  . GERD (gastroesophageal reflux disease)   . Obese   . Chronic sinusitis 02/2012    current runny nose of clear drainage  . Iron deficiency anemia   . Dental crowns present     Past Surgical History  Procedure Date  . Dilation and curettage of uterus 1987    Family History  Problem Relation Age of Onset  . Diabetes Mother     type 2  . Hypertension Mother   . Depression Mother   . Mental illness Mother     bi-polar  . Pulmonary embolism Mother   . Heart disease Maternal Grandfather   . Stroke Maternal Grandfather   . Other Father     drug addiction  . Depression Maternal Aunt 63    suicide attempt with pneumonia   Social History:  reports that she quit smoking about 8 years ago. She has never used smokeless tobacco. She reports that she drinks alcohol. She reports that she does not use illicit drugs.  Allergies:  Allergies  Allergen Reactions  . Penicillins Rash  . Sulfa Antibiotics Rash    Medications Prior to Admission  Medication Sig Dispense Refill  . esomeprazole (NEXIUM) 40 MG capsule Take 1 capsule (40 mg total) by mouth daily before breakfast.  90 capsule  3  . ferrous fumarate (HEMOCYTE - 106 MG FE) 325 (106 FE) MG TABS Take 1 tablet (106 mg of iron total) by mouth daily.  90 each  1  . levofloxacin (LEVAQUIN) 250 MG tablet Take 250 mg by mouth 2 (two) times daily.      . Multiple Vitamin (MULTIVITAMIN) tablet Take 1 tablet by mouth daily.        . sodium chloride (OCEAN) 0.65 % SOLN nasal spray Place 1 spray into the nose as needed for congestion.  1 Bottle  2    Results for orders placed during the hospital encounter of 03/23/12 (from the past 48 hour(s))  POCT HEMOGLOBIN-HEMACUE     Status: Normal   Collection Time   03/23/12  8:23 AM      Component Value Range  Comment   Hemoglobin 13.2  12.0 - 15.0 g/dL    No results found.  Review of Systems  Constitutional: Negative.   HENT: Positive for congestion.   Respiratory: Positive for cough.   Cardiovascular: Negative.   Gastrointestinal: Negative.   Musculoskeletal: Negative.   Neurological: Negative.     Blood pressure 124/83, pulse 83, temperature 98.2 F (36.8 C), temperature source Oral, resp. rate 18, height 5\' 7"  (1.702 m), weight 104.327 kg (230 lb), last menstrual period 02/18/2012, SpO2 98.00%. Physical Exam  Constitutional: She is oriented to person, place, and time. She appears well-developed and well-nourished.  HENT:  Nose: Septal deviation present.       Nasal obstruction, No mass or polyp  Neck: Normal range of motion. Neck supple.  Cardiovascular: Normal rate and regular rhythm.   Respiratory: Effort normal and breath sounds normal.  GI: Soft.  Musculoskeletal: Normal range of motion.  Neurological: She is alert and oriented to person, place, and time.     Assessment/Plan Adm for OP ESS and Septoplasty with Fusion.  Taneka Espiritu 03/23/2012, 8:58 AM

## 2012-03-23 NOTE — Brief Op Note (Signed)
03/23/2012  11:28 AM  PATIENT:  Connie Blanchard  41 y.o. female  PRE-OPERATIVE DIAGNOSIS:  Chronic sinusitis  POST-OPERATIVE DIAGNOSIS:  Chronic sinusitis  PROCEDURE:  Procedure(s) (LRB): NASAL SEPTOPLASTY WITH TURBINATE REDUCTION (Bilateral) ENDOSCOPIC SINUS SURGERY WITH FUSION NAVIGATION (Bilateral)  SURGEON:  Surgeon(s) and Role:    * Osborn Coho, MD - Primary  PHYSICIAN ASSISTANT:   ASSISTANTS: none   ANESTHESIA:   general  EBL:  Total I/O In: 1700 [I.V.:1700] Out: - 200 cc  BLOOD ADMINISTERED:none  DRAINS: none   LOCAL MEDICATIONS USED:  LIDOCAINE  and Amount: 8 ml  SPECIMEN:  Source of Specimen:  Bil sinuses  DISPOSITION OF SPECIMEN:  PATHOLOGY  COUNTS:  YES  TOURNIQUET:  * No tourniquets in log *  DICTATION: .Other Dictation: Dictation Number [id number]  PLAN OF CARE: Discharge to home after PACU  PATIENT DISPOSITION:  PACU - hemodynamically stable.   Delay start of Pharmacological VTE agent (>24hrs) due to surgical blood loss or risk of bleeding: not applicable

## 2012-03-23 NOTE — Anesthesia Postprocedure Evaluation (Signed)
  Anesthesia Post-op Note  Patient: Connie Blanchard  Procedure(s) Performed: Procedure(s) (LRB): NASAL SEPTOPLASTY WITH TURBINATE REDUCTION (Bilateral) ENDOSCOPIC SINUS SURGERY WITH FUSION NAVIGATION (Bilateral)  Patient Location: PACU  Anesthesia Type: General  Level of Consciousness: awake, alert  and oriented  Airway and Oxygen Therapy: Patient Spontanous Breathing  Post-op Pain: mild  Post-op Assessment: Post-op Vital signs reviewed, Patient's Cardiovascular Status Stable, Respiratory Function Stable, Patent Airway, No signs of Nausea or vomiting and Pain level controlled  Post-op Vital Signs: Reviewed and stable  Complications: No apparent anesthesia complications

## 2012-03-23 NOTE — Anesthesia Procedure Notes (Signed)
Procedure Name: Intubation Date/Time: 03/23/2012 9:12 AM Performed by: Caren Macadam Pre-anesthesia Checklist: Patient identified, Emergency Drugs available, Suction available and Patient being monitored Patient Re-evaluated:Patient Re-evaluated prior to inductionOxygen Delivery Method: Circle System Utilized Preoxygenation: Pre-oxygenation with 100% oxygen Intubation Type: IV induction Ventilation: Mask ventilation without difficulty Laryngoscope Size: Miller and 2 Grade View: Grade I Tube type: Oral Number of attempts: 1 Airway Equipment and Method: stylet Placement Confirmation: ETT inserted through vocal cords under direct vision,  positive ETCO2 and breath sounds checked- equal and bilateral Secured at: 22 cm Tube secured with: Tape Dental Injury: Teeth and Oropharynx as per pre-operative assessment

## 2012-03-24 NOTE — Op Note (Deleted)
NAME:  Connie Blanchard, Connie Blanchard             ACCOUNT NO.:  [id number]  MEDICAL RECORD NO.:  [id number]  LOCATION:  CATS                         FACILITY:  [facility]  PHYSICIAN:  Tana Trefry L. Emmitt Matthews, M.D.DATE OF BIRTH:  10/23/1970  DATE OF PROCEDURE:  03/23/2012 DATE OF DISCHARGE:  12/08/2011                              OPERATIVE REPORT   PREOPERATIVE DIAGNOSES: 1. Chronic sinusitis. 2. Deviated nasal septum with airway obstruction. 3. Bilateral inferior turbinate hypertrophy.  POSTOPERATIVE DIAGNOSES: 1. Chronic sinusitis. 2. Deviated nasal septum with airway obstruction. 3. Bilateral inferior turbinate hypertrophy.  INDICATION FOR SURGERY: 1. Chronic sinusitis. 2. Deviated nasal septum with airway obstruction. 3. Bilateral inferior turbinate hypertrophy.  ANESTHESIA:  General endotracheal.  SURGEON:  Norell Brisbin L. Ethelmae Ringel, MD.  SURGICAL PROCEDURE: 1. Bilateral endoscopic sinus surgery with intraoperative computer-     assisted navigation (fusion), sinus surgery consisting of bilateral     total ethmoidectomies, bilateral nasal frontal recess exploration     with balloon dilation, bilateral maxillary antrostomies. 2. Nasal septoplasty. 3. Bilateral inferior turbinate reduction.  ANESTHESIA:  General endotracheal.  COMPLICATIONS:  None.  ESTIMATED BLOOD LOSS:  200 mL.  The patient transferred from the operating room to the recovery room in stable condition.  FINDINGS:  Severely deviated nasal septum with airway obstruction and turbinate hypertrophy.  Significant bilateral maxillary ethmoid and frontal sinus mucosal disease with obstruction of the maxillary antrostomies and nasal frontal recess.  Balloon frontal sinus dilation accomplished without difficulty.  Bilateral nasal septal splints placed. Bilateral Kennedy sinus packs placed.  BRIEF HISTORY:  The patient is a 41-year-old white female.  She was referred to our office for evaluation of chronic sinusitis.   Examination showed severely deviated nasal septum with near complete nasal airway obstruction and turbinate hypertrophy.  She had a significant history of recurrent sinusitis with infections, multiple antibiotics, and failure to respond to both antibiotics, oral and topical steroids.  The patient was retreated with multiple broad-spectrum antibiotics and a followup CT scan was obtained which showed air-fluid levels in the maxillary sinuses, mucosal disease involving the maxillary antrostomy, ethmoid cavity and nasal frontal recess.  Given her history and failure to respond to appropriate medical therapy, I recommended that we consider her for the above surgical procedures.  The risks and benefits of the procedure discussed in detail with the patient and her family.  They understood and concurred with our plan for surgery which is scheduled as an outpatient under general anesthesia.  Prior to surgery, a complete axial CT scan of the sinuses in navigation format was obtained and the fusion navigation tool was used during the surgical procedure.  OPERATIVE PROCEDURE:  The patient brought to the operating room on March 23, 2012, placed in supine position on the operating table. General endotracheal anesthesia established without difficulty.  With the patient adequately anesthetized, she was positioned on the operating table and prepped and draped in sterile fashion.  Her nose was injected with a total of 8 mL of 1% lidocaine, 1:100,000 solution of epinephrine which was injected in a submucosal fashion along the nasal septum, inferior turbinates, lateral nasal wall, and middle turbinates bilaterally.  Her nose was then packed with Afrin-soaked cottonoid pledgets which were left   in place for approximately 10 minutes for vasoconstriction and hemostasis.  The fusion navigation head gear was applied and anatomic and surgical landmarks were identified and confirmed.  The fusion device was used  throughout the endoscopic component of the operation.  The patient prepared for surgery.  The operation begun on the left-hand side using a 0 degree nasal endoscope.  The nasal cavity was inspected. The middle turbinate was carefully medialized and the uncinate process was resected with a through cutting forceps.  Dissection was then carried out from anterior to posterior through the ethmoid sinuses, removing bony septations, obstructions, and mucosal disease.  The acclarent frontal sinus balloon dilator was then introduced in the surgical field.  The nasal frontal recess was identified using the navigation tool and the balloon dilation device was carefully inserted into the frontal sinus.  This was confirmed both with direct visualization, navigation, and transillumination of the frontal sinus. The balloon catheter was then dilated to its full diameter at 12 atmospheres.  This was done a second time in order to assure that the nasal frontal recess was widely patent and the entire device was withdrawn.  Then using a 45 degree telescope, curved microdebrider and navigation tool, the superior aspect of the ethmoid sinuses were carefully dissected from posterior to anterior.  The nasal frontal recess which had been previously dilated was identified.  There was a large accessory sinus tract along the anterior aspect, which was resected and underlying ethmoid air cells were completely dissected to create a widely patent nasal frontal recess.  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 Attention then turned to the lateral nasal wall where residual mucosal disease was resected with a straight microdebrider.  There was a large posterior accessory ostium.  The natural ostium of the maxillary sinus was identified and the intervening soft tissue was resected.  The uncinate process was reflected anteriorly and resected in its entirety, creating a  widely patent maxillary antrostomy.  The patient's right side was then examined using a 0 degree telescope and a straight microdebrider.  The uncinate process was resected, middle turbinate medialized, ethmoid bulla resected and dissection carried from anterior to posterior through the ethmoid air cells.  When the inferior aspect of the ethmoids have been dissected, the frontal sinus balloon dilation device was brought into the field and again under direct visualization the balloon catheter was inserted into the right frontal sinus.  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  This is also dilated under direct visualization.  Again using the 45 degree telescope, a curved microdebrider and navigation device, dissection was carried out from posterior to anterior removing bony septations and creating widely patent ethmoid cavity.  Dissection was then carried out in the nasal frontal recess where underlying agger nasi cell was resected and underlying obstruction removed.  Intervening sinus tissue between the natural right frontal sinus and the type III sinuses were then resected to again create a widely patent nasal frontal recess without obstruction or blockage.  Attention was then turned to the lateral nasal wall using a 45 degree telescope and posterior ostium was identified, intervening soft tissue resected and natural ostium was enlarged in an anterior-inferior direction creating a widely patent right maxillary sinus.  Nasal septoplasty was then performed.  A left anterior hemitransfixion incision was created.  A mucoperichondrial flap was elevated from anterior to posterior on   the left-hand side.  Cartilaginous septum was across the midline and the mucosal flap elevated on the right.  Deviated bone and cartilage in the mid and posterior aspects of the nasal septum were resected using a swivel knife and then  through cutting forceps to remove the large bony septal deviation.  The cartilage was morselized and then returned to the mucoperichondrial pocket.  Flaps were reapproximated with a 4-0 gut suture on a Keith needle in a horizontal mattress fashion and at the conclusion of the surgical procedure, bilateral Doyle nasal septal splints were placed after the application of Bactroban ointment and sutured in position with a 3-0 Ethilon suture.  Inferior turbinate reduction was then performed with cautery set at 12 watts.  Two submucosal passes were made with the intramural cautery. The turbinates were adequately cauterized, anterior incisions were created, overlying soft tissue elevated, and small amount of turbinate bone was then resected.  The turbinates were then outfractured to create a more patent nasal cavity.  Nasal cavity was then irrigated and suctioned.  Surgery was completed with careful inspection of the nasal and sinus passageways and clearance of surgical debris.  Bilateral Kennedy sinus packs were placed in the common ethmoid cavity after the application of Bactroban ointment and was hydrated with saline.  The nasal septal splints were placed and sutured.  The oral cavity was suctioned, orogastric tube passed, stomach contents aspirated.  The patient was then awakened from anesthetic.  She was extubated and was transferred from the operating room to the recovery room in stable condition.  There were no complications.  Blood loss was approximately 200 mL.          ______________________________ Gus Littler L. Manjit Bufano, M.D.     DLS/MEDQ  D:  03/23/2012  T:  03/24/2012  Job:  [id number]  cc:   Karstyn Birkey L. Keyshla Tunison, M.D. Epic Chart 

## 2012-03-24 NOTE — Op Note (Signed)
NAMEALLYN, BARTELSON NO.:  [id number]  MEDICAL RECORD NO.:  [id number]  LOCATION:  CATS                         FACILITY:  [facility]  PHYSICIAN:  Kinnie Scales. Annalee Genta, M.D.DATE OF BIRTH:  03-24-1971  DATE OF PROCEDURE:  03/23/2012 DATE OF DISCHARGE:  12/08/2011                              OPERATIVE REPORT   PREOPERATIVE DIAGNOSES: 1. Chronic sinusitis. 2. Deviated nasal septum with airway obstruction. 3. Bilateral inferior turbinate hypertrophy.  POSTOPERATIVE DIAGNOSES: 1. Chronic sinusitis. 2. Deviated nasal septum with airway obstruction. 3. Bilateral inferior turbinate hypertrophy.  INDICATION FOR SURGERY: 1. Chronic sinusitis. 2. Deviated nasal septum with airway obstruction. 3. Bilateral inferior turbinate hypertrophy.  ANESTHESIA:  General endotracheal.  SURGEON:  Kinnie Scales. Annalee Genta, MD.  SURGICAL PROCEDURE: 1. Bilateral endoscopic sinus surgery with intraoperative computer-     assisted navigation (fusion), sinus surgery consisting of bilateral     total ethmoidectomies, bilateral nasal frontal recess exploration     with balloon dilation, bilateral maxillary antrostomies. 2. Nasal septoplasty. 3. Bilateral inferior turbinate reduction.  ANESTHESIA:  General endotracheal.  COMPLICATIONS:  None.  ESTIMATED BLOOD LOSS:  200 mL.  The patient transferred from the operating room to the recovery room in stable condition.  FINDINGS:  Severely deviated nasal septum with airway obstruction and turbinate hypertrophy.  Significant bilateral maxillary ethmoid and frontal sinus mucosal disease with obstruction of the maxillary antrostomies and nasal frontal recess.  Balloon frontal sinus dilation accomplished without difficulty.  Bilateral nasal septal splints placed. Bilateral Kennedy sinus packs placed.  BRIEF HISTORY:  The patient is a 41 year old white female.  She was referred to our office for evaluation of chronic sinusitis.   Examination showed severely deviated nasal septum with near complete nasal airway obstruction and turbinate hypertrophy.  She had a significant history of recurrent sinusitis with infections, multiple antibiotics, and failure to respond to both antibiotics, oral and topical steroids.  The patient was retreated with multiple broad-spectrum antibiotics and a followup CT scan was obtained which showed air-fluid levels in the maxillary sinuses, mucosal disease involving the maxillary antrostomy, ethmoid cavity and nasal frontal recess.  Given her history and failure to respond to appropriate medical therapy, I recommended that we consider her for the above surgical procedures.  The risks and benefits of the procedure discussed in detail with the patient and her family.  They understood and concurred with our plan for surgery which is scheduled as an outpatient under general anesthesia.  Prior to surgery, a complete axial CT scan of the sinuses in navigation format was obtained and the fusion navigation tool was used during the surgical procedure.  OPERATIVE PROCEDURE:  The patient brought to the operating room on March 23, 2012, placed in supine position on the operating table. General endotracheal anesthesia established without difficulty.  With the patient adequately anesthetized, she was positioned on the operating table and prepped and draped in sterile fashion.  Her nose was injected with a total of 8 mL of 1% lidocaine, 1:100,000 solution of epinephrine which was injected in a submucosal fashion along the nasal septum, inferior turbinates, lateral nasal wall, and middle turbinates bilaterally.  Her nose was then packed with Afrin-soaked cottonoid pledgets which were left  in place for approximately 10 minutes for vasoconstriction and hemostasis.  The fusion navigation head gear was applied and anatomic and surgical landmarks were identified and confirmed.  The fusion device was used  throughout the endoscopic component of the operation.  The patient prepared for surgery.  The operation begun on the left-hand side using a 0 degree nasal endoscope.  The nasal cavity was inspected. The middle turbinate was carefully medialized and the uncinate process was resected with a through cutting forceps.  Dissection was then carried out from anterior to posterior through the ethmoid sinuses, removing bony septations, obstructions, and mucosal disease.  The acclarent frontal sinus balloon dilator was then introduced in the surgical field.  The nasal frontal recess was identified using the navigation tool and the balloon dilation device was carefully inserted into the frontal sinus.  This was confirmed both with direct visualization, navigation, and transillumination of the frontal sinus. The balloon catheter was then dilated to its full diameter at 12 atmospheres.  This was done a second time in order to assure that the nasal frontal recess was widely patent and the entire device was withdrawn.  Then using a 45 degree telescope, curved microdebrider and navigation tool, the superior aspect of the ethmoid sinuses were carefully dissected from posterior to anterior.  The nasal frontal recess which had been previously dilated was identified.  There was a large accessory sinus tract along the anterior aspect, which was resected and underlying ethmoid air cells were completely dissected to create a widely patent nasal frontal recess.  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 Attention then turned to the lateral nasal wall where residual mucosal disease was resected with a straight microdebrider.  There was a large posterior accessory ostium.  The natural ostium of the maxillary sinus was identified and the intervening soft tissue was resected.  The uncinate process was reflected anteriorly and resected in its entirety, creating a  widely patent maxillary antrostomy.  The patient's right side was then examined using a 0 degree telescope and a straight microdebrider.  The uncinate process was resected, middle turbinate medialized, ethmoid bulla resected and dissection carried from anterior to posterior through the ethmoid air cells.  When the inferior aspect of the ethmoids have been dissected, the frontal sinus balloon dilation device was brought into the field and again under direct visualization the balloon catheter was inserted into the right frontal sinus.  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  This is also dilated under direct visualization.  Again using the 45 degree telescope, a curved microdebrider and navigation device, dissection was carried out from posterior to anterior removing bony septations and creating widely patent ethmoid cavity.  Dissection was then carried out in the nasal frontal recess where underlying agger nasi cell was resected and underlying obstruction removed.  Intervening sinus tissue between the natural right frontal sinus and the type III sinuses were then resected to again create a widely patent nasal frontal recess without obstruction or blockage.  Attention was then turned to the lateral nasal wall using a 45 degree telescope and posterior ostium was identified, intervening soft tissue resected and natural ostium was enlarged in an anterior-inferior direction creating a widely patent right maxillary sinus.  Nasal septoplasty was then performed.  A left anterior hemitransfixion incision was created.  A mucoperichondrial flap was elevated from anterior to posterior on  the left-hand side.  Cartilaginous septum was across the midline and the mucosal flap elevated on the right.  Deviated bone and cartilage in the mid and posterior aspects of the nasal septum were resected using a swivel knife and then  through cutting forceps to remove the large bony septal deviation.  The cartilage was morselized and then returned to the mucoperichondrial pocket.  Flaps were reapproximated with a 4-0 gut suture on a Keith needle in a horizontal mattress fashion and at the conclusion of the surgical procedure, bilateral Doyle nasal septal splints were placed after the application of Bactroban ointment and sutured in position with a 3-0 Ethilon suture.  Inferior turbinate reduction was then performed with cautery set at 12 watts.  Two submucosal passes were made with the intramural cautery. The turbinates were adequately cauterized, anterior incisions were created, overlying soft tissue elevated, and small amount of turbinate bone was then resected.  The turbinates were then outfractured to create a more patent nasal cavity.  Nasal cavity was then irrigated and suctioned.  Surgery was completed with careful inspection of the nasal and sinus passageways and clearance of surgical debris.  Bilateral Kennedy sinus packs were placed in the common ethmoid cavity after the application of Bactroban ointment and was hydrated with saline.  The nasal septal splints were placed and sutured.  The oral cavity was suctioned, orogastric tube passed, stomach contents aspirated.  The patient was then awakened from anesthetic.  She was extubated and was transferred from the operating room to the recovery room in stable condition.  There were no complications.  Blood loss was approximately 200 mL.          ______________________________ Kinnie Scales. Annalee Genta, M.D.     DLS/MEDQ  D:  16/04/9603  T:  03/24/2012  Job:  [id number]  cc:   Onalee Hua L. Annalee Genta, M.D. Epic Chart

## 2012-03-31 ENCOUNTER — Encounter (HOSPITAL_BASED_OUTPATIENT_CLINIC_OR_DEPARTMENT_OTHER): Payer: Self-pay | Admitting: Otolaryngology

## 2012-05-17 ENCOUNTER — Ambulatory Visit (INDEPENDENT_AMBULATORY_CARE_PROVIDER_SITE_OTHER): Payer: Self-pay | Admitting: Family Medicine

## 2012-05-17 DIAGNOSIS — Z713 Dietary counseling and surveillance: Secondary | ICD-10-CM

## 2012-06-24 ENCOUNTER — Encounter: Payer: Self-pay | Admitting: Family Medicine

## 2012-06-24 ENCOUNTER — Ambulatory Visit (INDEPENDENT_AMBULATORY_CARE_PROVIDER_SITE_OTHER): Payer: Commercial Managed Care - PPO | Admitting: Family Medicine

## 2012-06-24 VITALS — BP 124/87 | HR 67 | Temp 98.0°F | Ht 67.25 in | Wt 231.0 lb

## 2012-06-24 DIAGNOSIS — J40 Bronchitis, not specified as acute or chronic: Secondary | ICD-10-CM

## 2012-06-24 DIAGNOSIS — J45909 Unspecified asthma, uncomplicated: Secondary | ICD-10-CM

## 2012-06-24 DIAGNOSIS — J209 Acute bronchitis, unspecified: Secondary | ICD-10-CM

## 2012-06-24 HISTORY — DX: Acute bronchitis, unspecified: J20.9

## 2012-06-24 HISTORY — DX: Acute bronchitis, unspecified: J45.909

## 2012-06-24 MED ORDER — AZITHROMYCIN 250 MG PO TABS: ORAL_TABLET | ORAL | Status: DC

## 2012-06-24 MED ORDER — GUAIFENESIN ER 600 MG PO TB12: 1200.0000 mg | ORAL_TABLET | Freq: Two times a day (BID) | ORAL | Status: DC

## 2012-06-24 MED ORDER — PROBIOTIC PRODUCT PO CHEW: CHEWABLE_TABLET | ORAL | Status: DC

## 2012-06-24 MED ORDER — ALBUTEROL SULFATE HFA 108 (90 BASE) MCG/ACT IN AERS: 2.0000 | INHALATION_SPRAY | RESPIRATORY_TRACT | Status: DC | PRN

## 2012-06-24 NOTE — Progress Notes (Signed)
Patient ID: Connie Blanchard, female   DOB: 1971/03/22, 41 y.o.   MRN: [id number] Connie Blanchard [id number] December 29, 1970 06/24/2012      Progress Note-Follow Up  Subjective  Chief Complaint  Chief Complaint  Patient presents with  . URI    chest congestion, cough w/ phlegm (yellow) X 6 days    HPI  Patient is a 41 year old Caucasian female who's been struggling with respiratory symptoms for about a week now. Her daughter had similar symptoms but recovered well. Unfortunately she continues to worsen. She's had initially chest congestion but no head congestion as well. Cough is now productive of yellow phlegm. She notes over the left today she noted she lies down she struggling with wheezing. She's tried NyQuil which does help temporarily and Mucinex. She struggles also with headache fatigue malaise myalgias and some mild anorexia. She denies any obvious fevers, chills, ear pain. No chest pain, palpitations, nausea, diarrhea or other acute complaints are noted.  Past Medical History  Diagnosis Date  . GERD (gastroesophageal reflux disease)   . Obese   . Chronic sinusitis 02/2012    current runny nose of clear drainage  . Iron deficiency anemia   . Dental crowns present     Past Surgical History  Procedure Date  . Dilation and curettage of uterus 1987  . Nasal septoplasty w/ turbinoplasty 03/23/2012    Procedure: NASAL SEPTOPLASTY WITH TURBINATE REDUCTION;  Surgeon: Osborn Coho, MD;  Location: [facility];  Service: ENT;  Laterality: Bilateral;  . Sinus endo w/fusion 03/23/2012    Procedure: ENDOSCOPIC SINUS SURGERY WITH FUSION NAVIGATION;  Surgeon: Osborn Coho, MD;  Location: [facility];  Service: ENT;  Laterality: Bilateral;  with fusion scan    Family History  Problem Relation Age of Onset  . Diabetes Mother     type 2  . Hypertension Mother   . Depression Mother   . Mental illness Mother     bi-polar  . Pulmonary embolism Mother   .  Heart disease Maternal Grandfather   . Stroke Maternal Grandfather   . Other Father     drug addiction  . Depression Maternal Aunt 48    suicide attempt with pneumonia    History   Social History  . Marital Status: Married    Spouse Name: N/A    Number of Children: N/A  . Years of Education: N/A   Occupational History  . Not on file.   Social History Main Topics  . Smoking status: Former Smoker -- 1.0 packs/day for 19 years    Quit date: 07/28/2003  . Smokeless tobacco: Never Used  . Alcohol Use: Yes     Comment: occasionally  . Drug Use: No  . Sexually Active: Yes -- Female partner(s)   Other Topics Concern  . Not on file   Social History Narrative  . No narrative on file    Current Outpatient Prescriptions on File Prior to Visit  Medication Sig Dispense Refill  . esomeprazole (NEXIUM) 40 MG capsule Take 1 capsule (40 mg total) by mouth daily before breakfast.  90 capsule  3  . ferrous fumarate (HEMOCYTE - 106 MG FE) 325 (106 FE) MG TABS Take 1 tablet (106 mg of iron total) by mouth daily.  90 each  1  . Multiple Vitamin (MULTIVITAMIN) tablet Take 1 tablet by mouth daily.        . sodium chloride (OCEAN) 0.65 % SOLN nasal spray Place 1 spray into the nose  as needed for congestion.  1 Bottle  2  . albuterol (PROVENTIL HFA;VENTOLIN HFA) 108 (90 BASE) MCG/ACT inhaler Inhale 2 puffs into the lungs every 4 (four) hours as needed for wheezing.  1 Inhaler  1    Allergies  Allergen Reactions  . Penicillins Rash  . Sulfa Antibiotics Rash    Review of Systems  Review of Systems  Constitutional: Negative for fever, chills and malaise/fatigue.  HENT: Positive for congestion.   Eyes: Negative for discharge.  Respiratory: Positive for cough, sputum production and wheezing. Negative for shortness of breath.   Cardiovascular: Negative for chest pain, palpitations and leg swelling.  Gastrointestinal: Negative for heartburn, nausea, abdominal pain and diarrhea.  Genitourinary:  Negative for dysuria.  Musculoskeletal: Negative for falls.  Skin: Negative for rash.  Neurological: Positive for headaches. Negative for loss of consciousness.  Endo/Heme/Allergies: Negative for polydipsia.  Psychiatric/Behavioral: Negative for depression and suicidal ideas. The patient is not nervous/anxious and does not have insomnia.     Objective  BP 124/87  Pulse 67  Temp 98 F (36.7 C) (Temporal)  Ht 5' 7.25" (1.708 m)  Wt 231 lb (104.781 kg)  BMI 35.91 kg/m2  SpO2 96%  LMP 05/24/2012  Physical Exam  Physical Exam  Constitutional: She is oriented to person, place, and time and well-developed, well-nourished, and in no distress. No distress.  HENT:  Head: Normocephalic and atraumatic.  Eyes: Conjunctivae normal are normal.  Neck: Neck supple. No thyromegaly present.  Cardiovascular: Normal rate, regular rhythm and normal heart sounds.   No murmur heard. Pulmonary/Chest: Effort normal. She has no wheezes.       Rhonchi LLL  Abdominal: She exhibits no distension and no mass.  Musculoskeletal: She exhibits no edema.  Lymphadenopathy:    She has no cervical adenopathy.  Neurological: She is alert and oriented to person, place, and time.  Skin: Skin is warm and dry. No rash noted. She is not diaphoretic.  Psychiatric: Memory, affect and judgment normal.    Lab Results  Component Value Date   TSH 2.00 07/29/2011   Lab Results  Component Value Date   WBC 7.7 10/29/2011   HGB 13.2 03/23/2012   HCT 35.9* 10/29/2011   MCV 80.1 10/29/2011   PLT 332 10/29/2011   Lab Results  Component Value Date   CREATININE 0.8 07/29/2011   BUN 11 07/29/2011   NA 140 07/29/2011   K 4.1 07/29/2011   CL 106 07/29/2011   CO2 29 07/29/2011   Lab Results  Component Value Date   ALT 19 07/29/2011   AST 21 07/29/2011   ALKPHOS 46 07/29/2011   BILITOT 0.2* 07/29/2011   Lab Results  Component Value Date   CHOL 194 07/29/2011   Lab Results  Component Value Date   HDL 58.90 07/29/2011   Lab Results    Component Value Date   LDLCALC 121* 07/29/2011   Lab Results  Component Value Date   TRIG 69.0 07/29/2011   Lab Results  Component Value Date   CHOLHDL 3 07/29/2011     Assessment & Plan  Bronchitis Increase rest and hydration. Mucinex twice a day and started on azithromycin. Encouraged probiotics. Call if no improvement.

## 2012-06-24 NOTE — Assessment & Plan Note (Signed)
Increase rest and hydration. Mucinex twice a day and started on azithromycin. Encouraged probiotics. Call if no improvement.

## 2012-06-24 NOTE — Patient Instructions (Addendum)

## 2012-07-04 ENCOUNTER — Encounter: Payer: Self-pay | Admitting: Family Medicine

## 2012-07-04 ENCOUNTER — Ambulatory Visit (INDEPENDENT_AMBULATORY_CARE_PROVIDER_SITE_OTHER): Payer: Commercial Managed Care - PPO | Admitting: Family Medicine

## 2012-07-04 VITALS — BP 127/83 | HR 73 | Temp 98.2°F | Ht 67.25 in | Wt 232.8 lb

## 2012-07-04 DIAGNOSIS — J40 Bronchitis, not specified as acute or chronic: Secondary | ICD-10-CM

## 2012-07-04 MED ORDER — CEFDINIR 300 MG PO CAPS: 300.0000 mg | ORAL_CAPSULE | Freq: Two times a day (BID) | ORAL | Status: AC

## 2012-07-04 NOTE — Assessment & Plan Note (Signed)
Improved with recent treatment for about a week then over this past weekend cough and wheezing returned. Will try a course of Cefdinir. PCN is listed as an allergy and was only a rash when she was little. She is warned about the small possiblity of cross reaction and she will not take another dose if any rash, swelling or breathing difficulty occurs. Continue probiotics and mucinex

## 2012-07-04 NOTE — Patient Instructions (Addendum)

## 2012-07-04 NOTE — Progress Notes (Signed)
Patient ID: Connie Blanchard, female   DOB: 1970-11-06, 41 y.o.   MRN: [id number] Connie Blanchard [id number] [birth year] 07/04/2012      Progress Note-Follow Up  Subjective  Chief Complaint  Chief Complaint  Patient presents with  . Wheezing    congestion and coughing not any better    HPI  Patient is a 41 year old Caucasian female who is in today for recurrence cough. She was seen couple weeks ago was placed on azithromycin and her cough got dramatically better. Then over the last 2-3 days she's had increase in her cough wheezing and congestion once again. No fevers chills. No malaise, myalgias, ear pain, throat pain, headache, chest pain, palpitations noted with this episode. No GI or GU complaints. She has been taking probiotics and Mucinex and that has been partially helpful.  Past Medical History  Diagnosis Date  . GERD (gastroesophageal reflux disease)   . Obese   . Chronic sinusitis 02/2012    current runny nose of clear drainage  . Iron deficiency anemia   . Dental crowns present     Past Surgical History  Procedure Date  . Dilation and curettage of uterus 1987  . Nasal septoplasty w/ turbinoplasty 03/23/2012    Procedure: NASAL SEPTOPLASTY WITH TURBINATE REDUCTION;  Surgeon: Osborn Coho, MD;  Location: [facility];  Service: ENT;  Laterality: Bilateral;  . Sinus endo w/fusion 03/23/2012    Procedure: ENDOSCOPIC SINUS SURGERY WITH FUSION NAVIGATION;  Surgeon: Osborn Coho, MD;  Location: [facility];  Service: ENT;  Laterality: Bilateral;  with fusion scan    Family History  Problem Relation Age of Onset  . Diabetes Mother     type 2  . Hypertension Mother   . Depression Mother   . Mental illness Mother     bi-polar  . Pulmonary embolism Mother   . Heart disease Maternal Grandfather   . Stroke Maternal Grandfather   . Other Father     drug addiction  . Depression Maternal Aunt 55    suicide attempt with pneumonia    History    Social History  . Marital Status: Married    Spouse Name: N/A    Number of Children: N/A  . Years of Education: N/A   Occupational History  . Not on file.   Social History Main Topics  . Smoking status: Former Smoker -- 1.0 packs/day for 19 years    Quit date: 07/28/2003  . Smokeless tobacco: Never Used  . Alcohol Use: Yes     Comment: occasionally  . Drug Use: No  . Sexually Active: Yes -- Female partner(s)   Other Topics Concern  . Not on file   Social History Narrative  . No narrative on file    Current Outpatient Prescriptions on File Prior to Visit  Medication Sig Dispense Refill  . albuterol (PROVENTIL HFA;VENTOLIN HFA) 108 (90 BASE) MCG/ACT inhaler Inhale 2 puffs into the lungs every 4 (four) hours as needed for wheezing.  1 Inhaler  1  . esomeprazole (NEXIUM) 40 MG capsule Take 1 capsule (40 mg total) by mouth daily before breakfast.  90 capsule  3  . ferrous fumarate (HEMOCYTE - 106 MG FE) 325 (106 FE) MG TABS Take 1 tablet (106 mg of iron total) by mouth daily.  90 each  1  . guaiFENesin (MUCINEX) 600 MG 12 hr tablet Take 2 tablets (1,200 mg total) by mouth 2 (two) times daily.      . Multiple Vitamin (  MULTIVITAMIN) tablet Take 1 tablet by mouth daily.        . Probiotic Product (MISC INTESTINAL FLORA REGULAT) CHEW Digestive Advantage probiotics by Schiff    0  . sodium chloride (OCEAN) 0.65 % SOLN nasal spray Place 1 spray into the nose as needed for congestion.  1 Bottle  2    Allergies  Allergen Reactions  . Penicillins Rash  . Sulfa Antibiotics Rash    Review of Systems  Review of Systems  Constitutional: Negative for fever, chills and malaise/fatigue.  HENT: Positive for congestion. Negative for sore throat.   Eyes: Negative for discharge.  Respiratory: Positive for cough and wheezing. Negative for shortness of breath.   Cardiovascular: Negative for chest pain, palpitations and leg swelling.  Gastrointestinal: Negative for nausea, abdominal pain and  diarrhea.  Genitourinary: Negative for dysuria.  Musculoskeletal: Negative for falls.  Skin: Negative for rash.  Neurological: Negative for loss of consciousness and headaches.  Endo/Heme/Allergies: Negative for polydipsia.  Psychiatric/Behavioral: Negative for depression and suicidal ideas. The patient is not nervous/anxious and does not have insomnia.     Objective  BP 127/83  Pulse 73  Temp 98.2 F (36.8 C) (Temporal)  Ht 5' 7.25" (1.708 m)  Wt 232 lb 12.8 oz (105.597 kg)  BMI 36.19 kg/m2  SpO2 96%  LMP 05/24/2012  Physical Exam  Physical Exam  Constitutional: She is oriented to person, place, and time and well-developed, well-nourished, and in no distress. No distress.  HENT:  Head: Normocephalic and atraumatic.  Eyes: Conjunctivae normal are normal.  Neck: Neck supple. No thyromegaly present.  Cardiovascular: Normal rate, regular rhythm and normal heart sounds.   No murmur heard. Pulmonary/Chest: Effort normal. She has no wheezes. She has rales.       RLL  Abdominal: She exhibits no distension and no mass.  Musculoskeletal: She exhibits no edema.  Lymphadenopathy:    She has no cervical adenopathy.  Neurological: She is alert and oriented to person, place, and time.  Skin: Skin is warm and dry. No rash noted. She is not diaphoretic.  Psychiatric: Memory, affect and judgment normal.    Lab Results  Component Value Date   TSH 2.00 07/29/2011   Lab Results  Component Value Date   WBC 7.7 10/29/2011   HGB 13.2 03/23/2012   HCT 35.9* 10/29/2011   MCV 80.1 10/29/2011   PLT 332 10/29/2011   Lab Results  Component Value Date   CREATININE 0.8 07/29/2011   BUN 11 07/29/2011   NA 140 07/29/2011   K 4.1 07/29/2011   CL 106 07/29/2011   CO2 29 07/29/2011   Lab Results  Component Value Date   ALT 19 07/29/2011   AST 21 07/29/2011   ALKPHOS 46 07/29/2011   BILITOT 0.2* 07/29/2011   Lab Results  Component Value Date   CHOL 194 07/29/2011   Lab Results  Component Value Date   HDL 58.90  07/29/2011   Lab Results  Component Value Date   LDLCALC 121* 07/29/2011   Lab Results  Component Value Date   TRIG 69.0 07/29/2011   Lab Results  Component Value Date   CHOLHDL 3 07/29/2011     Assessment & Plan  Bronchitis Improved with recent treatment for about a week then over this past weekend cough and wheezing returned. Will try a course of Cefdinir. PCN is listed as an allergy and was only a rash when she was little. She is warned about the small possiblity of cross reaction and  she will not take another dose if any rash, swelling or breathing difficulty occurs. Continue probiotics and mucinex

## 2012-08-17 ENCOUNTER — Telehealth: Payer: Self-pay | Admitting: Family Medicine

## 2012-08-17 MED ORDER — SCOPOLAMINE 1 MG/3DAYS TD PT72: 1.0000 | MEDICATED_PATCH | TRANSDERMAL | Status: DC

## 2012-08-17 MED ORDER — ONDANSETRON HCL 4 MG PO TABS: 4.0000 mg | ORAL_TABLET | Freq: Three times a day (TID) | ORAL | Status: DC | PRN

## 2012-08-17 NOTE — Telephone Encounter (Signed)
RX's sent and a detailed message left on patients voicemail

## 2012-08-17 NOTE — Telephone Encounter (Signed)
Please advise 

## 2012-08-17 NOTE — Telephone Encounter (Signed)
OK to rx Scopalamine patch apply patch behind ear and change every 72 hours. Disp #4, no refill. Ondansetron 4 mg tabs 1 tab po q 8 hours prn n/v, disp #10

## 2012-08-17 NOTE — Telephone Encounter (Signed)
Called to request anti-nausea/motion sickness medications and antibiotic for 7 day [place] cruise.  Specifically mentioned Zofran and Scopolamine patch. Leaving on 08/28/12.  Informed likely will not need antibiotic since there is medical care available on cruise ship and antibiotics are not usually ordered "preventatively."  Suggested Dramamine and Sea Bands for motion sickness. [facility]. Please call back with MD recommendations.

## 2012-12-02 ENCOUNTER — Encounter: Payer: Self-pay | Admitting: Family Medicine

## 2012-12-02 MED ORDER — ESOMEPRAZOLE MAGNESIUM 40 MG PO CPDR: 40.0000 mg | DELAYED_RELEASE_CAPSULE | Freq: Every day | ORAL | Status: DC

## 2012-12-05 ENCOUNTER — Ambulatory Visit (INDEPENDENT_AMBULATORY_CARE_PROVIDER_SITE_OTHER): Payer: 59 | Admitting: Family Medicine

## 2012-12-05 ENCOUNTER — Encounter: Payer: Self-pay | Admitting: Family Medicine

## 2012-12-05 VITALS — Ht 67.0 in | Wt 222.6 lb

## 2012-12-05 DIAGNOSIS — E669 Obesity, unspecified: Secondary | ICD-10-CM

## 2012-12-05 DIAGNOSIS — E785 Hyperlipidemia, unspecified: Secondary | ICD-10-CM

## 2012-12-05 NOTE — Progress Notes (Signed)
Medical Nutrition Therapy:  Appt start time: 1100 end time:  1200.  Assessment:  Primary concerns today: Weight management.  Connie Blanchard would like to manage her appetite better, especially night eating (usually 4-5 X wk).  Typical night snacks include 1-2 cereal bars/granola bars or ice cream.   Usual eating pattern includes 3 meals and 2-3 snacks per day. Usual physical activity includes working out at home or at ex classes at [employer] 1 X wk. 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   Frequent foods include coffee w/ 2 creams & 2 sugars, water, 12 oz diet soda, home-cooked or McD's oatmeal, salad 2-3 X wk, soup 2-3 X wk.  Avoided foods include ham, fried foods, most red meat.   McD's oatmeal contains 58 g of CHO, 32 of which is sugar.  Sugar content of home-cooked oatmeal is ~4 g from the tsp of honey Kim uses, plus whatever fruit she adds. Connie Blanchard has a history of HLD, for which weight loss would be a help.      No 24-hr recall b/c Connie Blanchard was sick with stomach virus.  A typical weekday (last Friday) is as follows.  Estimated kcal intake:  ~1400.  (Up at 6 AM) B (8 AM)-   McD's frt & maple oatmeal, coffee w/ 2 creams & 2 sugars    410 Snk ( AM)-   none L ( PM)-  Salad w/ beans         220 Snk ( PM)-  Special K protein bar         180 D ( PM)-  Rotissery chx skinless breast, 1 c frozen [place] fries w/ 1 oz cheese, diet soda 580 Snk ( PM)-  None, and no night eating  Progress Towards Goal(s):  In progress.   Nutritional Diagnosis:  NI-5.8.2 Excessive carbohydrate intake As related to protein.  As evidenced by carb-heavy breakfast with no protein component, and minimal protein with other meals.    Intervention:  Nutrition education.  Monitoring/Evaluation:  Dietary intake, exercise, and body weight in 4 week(s).

## 2012-12-05 NOTE — Patient Instructions (Addendum)
-   Food goals:  1. Protein with every meal (if using beans, at least one full cup).   - Increase protein at breakfast by adding powdered milk (1/3 cup per serving = 1 fluid cup of milk).   - Also add cinnamon and fruit such as berries and up to 2 tbsp nuts/seeds.    2. Include veg's at both lunch and dinner.  For lunch, bring (or buy) carrots or other veg's.    3. Plan tomorrow's meals; WHERE, WHEN, & WHAT?  4. Record each time you eat at night (after being asleep).  - Activity goal:   1. 30 minutes 3 X wk.   2. Wear a pedometer at work.  Record number of steps daily along with your sleep hours.   - Sleep goal:   1. Record # of hours of sleep nightly.

## 2012-12-22 ENCOUNTER — Telehealth: Payer: Self-pay | Admitting: Family Medicine

## 2012-12-22 NOTE — Telephone Encounter (Signed)
Please advised  

## 2012-12-22 NOTE — Telephone Encounter (Signed)
I am sorry but vast majority of sinus infections are viral. Increase rest and Mucinex 600 mg bid x 10 days, a zinc product like Coldeeze helps viruses and a probiotic like digestive advantage if continues to worsen, come in for evaluation and potential antibiotics.

## 2012-12-22 NOTE — Telephone Encounter (Signed)
Pt informed and voiced understanding

## 2012-12-22 NOTE — Telephone Encounter (Signed)
Patient called wants to know if an antibotic can be called in ,pt has sinus infection,   Ph #  [phone]

## 2013-01-02 ENCOUNTER — Ambulatory Visit: Payer: 59 | Admitting: Family Medicine

## 2013-01-23 ENCOUNTER — Ambulatory Visit (INDEPENDENT_AMBULATORY_CARE_PROVIDER_SITE_OTHER): Payer: 59 | Admitting: Family Medicine

## 2013-01-23 ENCOUNTER — Encounter: Payer: Self-pay | Admitting: Family Medicine

## 2013-01-23 VITALS — Ht 67.0 in | Wt 227.0 lb

## 2013-01-23 DIAGNOSIS — E669 Obesity, unspecified: Secondary | ICD-10-CM

## 2013-01-23 DIAGNOSIS — E785 Hyperlipidemia, unspecified: Secondary | ICD-10-CM

## 2013-01-23 NOTE — Patient Instructions (Addendum)
-   PLAN AHEAD:  - Think about foods you need to buy and prepare ahead for the week.    - Prepare extras of foods like oatmeal, egg casserole, cooked meats, salmon patties, burritos, grain salads, coleslaw (made with low-fat mayo and some plain nonfat Greek yogurt), cut vegetables   and/or fruit, hard-boiled eggs.  - Make a list of those things you can do ahead.  Keep handy.  Use that as a basis for shopping.   - Food goals:    1. Food and exercise record daily.  (Record by meal/snack or no more than one day at a time.)  2. Protein food with each meal.   3. Vegetables 2 X day.    4. At least 30 min exercise 3 X wk.  - Email progress report no later than July 14.

## 2013-01-23 NOTE — Progress Notes (Signed)
Medical Nutrition Therapy:  Appt start time: 1200 end time:  1245.  Assessment:  Primary concerns today: Weight management.  Connie Blanchard just got back from a week at the beach, where she ate 3 large, cooked meals/day, mostly due to her mother-in-law's routine of cooking.  She realized she really didn't need all that food, but of course, she joined everyone else at meals.  Connie Blanchard walked at least 1/2 mile per day while on vacation.  Prior to the beach, she exercised only about 1 X wk.   Connie Blanchard did not keep a food record, as recommended, and she has not eaten many veg's since last appt, but she bought a lot of veg's yesterday, and has already cut up some raw veg's to take to work.   Connie Blanchard has brought her walking shoes to work, so she can walk at lunch or right after work.  She anticipates more time for exercise and food prep now that her kids are out of school, and have fewer activities.  Connie Blanchard has discovered she eats less food later, including at night, if she eats a good protein food for breakfast and/or lunch.  She has been sleeping more soundly since not usually getting up to eat.    24-hr recall:  (UP at 7 AM; drove home from the beach yesterday:) B (7:30 AM)-  1 Nutritgrain bar, coffee w/ flavored creamer (30 kcal) Snk ( AM)-  none L (1 PM)-  McD fish filet sandw w/ Cheese, small ff's, diet soda Snk (4 PM)-  1 Quaker Chewy granola bar, diet soda D (6 PM)-  1 large chx tenders, 15 tater tots, diet soda Snk (8 PM)-  2 sugar cookies w/ frosting, diet soda No night eating last night.    Progress Towards Goal(s):  In progress.   Nutritional Diagnosis:  NI-5.8.2 Excessive carbohydrate intake As related to protein.  As evidenced by carb-heavy breakfast with no protein component, and minimal protein with other meals.    Intervention:  Nutrition education.  Monitoring/Evaluation:  Dietary intake, exercise, and body weight in 4 week(s).

## 2013-02-21 ENCOUNTER — Ambulatory Visit: Payer: 59 | Admitting: Family Medicine

## 2013-04-03 ENCOUNTER — Encounter: Payer: Self-pay | Admitting: Physician Assistant

## 2013-04-03 ENCOUNTER — Ambulatory Visit (INDEPENDENT_AMBULATORY_CARE_PROVIDER_SITE_OTHER): Payer: 59 | Admitting: Physician Assistant

## 2013-04-03 VITALS — BP 126/78 | HR 79 | Temp 98.4°F | Resp 16 | Wt 229.0 lb

## 2013-04-03 DIAGNOSIS — J329 Chronic sinusitis, unspecified: Secondary | ICD-10-CM

## 2013-04-03 MED ORDER — FLUTICASONE PROPIONATE 50 MCG/ACT NA SUSP: 2.0000 | Freq: Every day | NASAL | Status: DC

## 2013-04-03 NOTE — Progress Notes (Signed)
Patient ID: Connie Blanchard, female   DOB: 10-Dec-1970, 42 y.o.   MRN: [id number]  Patient presents to clinic today c/o sinus pressure, sore throat and nasal congestion x 6-7 days.  Patient denies fever, chills, N/V, rash.  Has history of seasonal allergies.  Patient is s/p sinus surgery back in 2011.  Nasal discharge is yellow in color.  Endorses dry, hacking cough and post-nasal drip.  Has taken tylenol with moderate relief of symptoms.  Past Medical History  Diagnosis Date  . GERD (gastroesophageal reflux disease)   . Obese   . Chronic sinusitis 02/2012    current runny nose of clear drainage  . Iron deficiency anemia   . Dental crowns present     Current Outpatient Prescriptions on File Prior to Visit  Medication Sig Dispense Refill  . esomeprazole (NEXIUM) 40 MG capsule Take 1 capsule (40 mg total) by mouth daily before breakfast.  90 capsule  1  . ferrous fumarate (HEMOCYTE - 106 MG FE) 325 (106 FE) MG TABS Take 1 tablet (106 mg of iron total) by mouth daily.  90 each  1  . guaiFENesin (MUCINEX) 600 MG 12 hr tablet Take 2 tablets (1,200 mg total) by mouth 2 (two) times daily.      . Multiple Vitamin (MULTIVITAMIN) tablet Take 1 tablet by mouth daily.        . Probiotic Product (MISC INTESTINAL FLORA REGULAT) CHEW Digestive Advantage probiotics by Schiff    0  . sodium chloride (OCEAN) 0.65 % SOLN nasal spray Place 1 spray into the nose as needed for congestion.  1 Bottle  2   No current facility-administered medications on file prior to visit.    Allergies  Allergen Reactions  . Penicillins Rash  . Sulfa Antibiotics Rash    Family History  Problem Relation Age of Onset  . Diabetes Mother     type 2  . Hypertension Mother   . Depression Mother   . Mental illness Mother     bi-polar  . Pulmonary embolism Mother   . Heart disease Maternal Grandfather   . Stroke Maternal Grandfather   . Other Father     drug addiction  . Depression Maternal Aunt 54    suicide attempt with  pneumonia    History   Social History  . Marital Status: Married    Spouse Name: N/A    Number of Children: N/A  . Years of Education: N/A   Social History Main Topics  . Smoking status: Former Smoker -- 1.00 packs/day for 19 years    Quit date: 07/28/2003  . Smokeless tobacco: Never Used  . Alcohol Use: Yes     Comment: occasionally  . Drug Use: No  . Sexual Activity: Yes    Partners: Male   Other Topics Concern  . Not on file   Social History Narrative  . No narrative on file   Review of Systems  Constitutional: Negative for fever, chills and malaise/fatigue.  HENT: Positive for congestion and sore throat. Negative for ear pain and ear discharge.   Respiratory: Positive for cough. Negative for hemoptysis, sputum production, shortness of breath and wheezing.   Gastrointestinal: Negative for nausea, vomiting, abdominal pain, diarrhea and constipation.  Musculoskeletal: Negative for myalgias.  Neurological: Negative for headaches.  Endo/Heme/Allergies: Positive for environmental allergies.   Filed Vitals:   04/03/13 1612  BP: 126/78  Pulse: 79  Temp: 98.4 F (36.9 C)  Resp: 16   Physical Exam  Vitals reviewed.  Constitutional: She is oriented to person, place, and time and well-developed, well-nourished, and in no distress.  HENT:  Head: Normocephalic and atraumatic.  Right Ear: External ear normal.  Left Ear: External ear normal.  Nose: Nose normal.  Mouth/Throat: No oropharyngeal exudate.  TM WNL bilaterally. Mild posterior oropharyngeal erythema without exudate, adenopathy, or palatial petechiae.  Neck: Normal range of motion. Neck supple.  Cardiovascular: Normal rate, regular rhythm, normal heart sounds and intact distal pulses.   Pulmonary/Chest: Effort normal and breath sounds normal. No respiratory distress. She has no wheezes. She has no rales.  Lymphadenopathy:    She has no cervical adenopathy.  Neurological: She is alert and oriented to person,  place, and time.  Skin: Skin is warm and dry.   Assessment/Plan: No problem-specific assessment & plan notes found for this encounter.

## 2013-04-03 NOTE — Assessment & Plan Note (Signed)
Most likely viral etiology.  Fluids, rest, saline nasal spray, refill of flonase given, warm liquids to soothe throat, mucinex and/or tylenol prn.  Call if symptoms persist or acutely worsen.

## 2013-04-03 NOTE — Patient Instructions (Addendum)
Please get plenty of fluids and plenty of rest.  Use saline nasal spray.  Prescription refilled for Flonase.  Warm liquids to soothe throat.  Mucinex as needed.  Please call or return to clinic if symptoms persist or acutely worsen.  Sinusitis Sinusitis is redness, soreness, and swelling (inflammation) of the paranasal sinuses. Paranasal sinuses are air pockets within the bones of your face (beneath the eyes, the middle of the forehead, or above the eyes). In healthy paranasal sinuses, mucus is able to drain out, and air is able to circulate through them by way of your nose. However, when your paranasal sinuses are inflamed, mucus and air can become trapped. This can allow bacteria and other germs to grow and cause infection. Sinusitis can develop quickly and last only a short time (acute) or continue over a long period (chronic). Sinusitis that lasts for more than 12 weeks is considered chronic.  CAUSES  Causes of sinusitis include:  Allergies.  Structural abnormalities, such as displacement of the cartilage that separates your nostrils (deviated septum), which can decrease the air flow through your nose and sinuses and affect sinus drainage.  Functional abnormalities, such as when the small hairs (cilia) that line your sinuses and help remove mucus do not work properly or are not present. SYMPTOMS  Symptoms of acute and chronic sinusitis are the same. The primary symptoms are pain and pressure around the affected sinuses. Other symptoms include:  Upper toothache.  Earache.  Headache.  Bad breath.  Decreased sense of smell and taste.  A cough, which worsens when you are lying flat.  Fatigue.  Fever.  Thick drainage from your nose, which often is green and may contain pus (purulent).  Swelling and warmth over the affected sinuses. DIAGNOSIS  Your caregiver will perform a physical exam. During the exam, your caregiver may:  Look in your nose for signs of abnormal growths in your  nostrils (nasal polyps).  Tap over the affected sinus to check for signs of infection.  View the inside of your sinuses (endoscopy) with a special imaging device with a light attached (endoscope), which is inserted into your sinuses. If your caregiver suspects that you have chronic sinusitis, one or more of the following tests may be recommended:  Allergy tests.  Nasal culture A sample of mucus is taken from your nose and sent to a lab and screened for bacteria.  Nasal cytology A sample of mucus is taken from your nose and examined by your caregiver to determine if your sinusitis is related to an allergy. TREATMENT  Most cases of acute sinusitis are related to a viral infection and will resolve on their own within 10 days. Sometimes medicines are prescribed to help relieve symptoms (pain medicine, decongestants, nasal steroid sprays, or saline sprays).  However, for sinusitis related to a bacterial infection, your caregiver will prescribe antibiotic medicines. These are medicines that will help kill the bacteria causing the infection.  Rarely, sinusitis is caused by a fungal infection. In theses cases, your caregiver will prescribe antifungal medicine. For some cases of chronic sinusitis, surgery is needed. Generally, these are cases in which sinusitis recurs more than 3 times per year, despite other treatments. HOME CARE INSTRUCTIONS   Drink plenty of water. Water helps thin the mucus so your sinuses can drain more easily.  Use a humidifier.  Inhale steam 3 to 4 times a day (for example, sit in the bathroom with the shower running).  Apply a warm, moist washcloth to your face 3  to 4 times a day, or as directed by your caregiver.  Use saline nasal sprays to help moisten and clean your sinuses.  Take over-the-counter or prescription medicines for pain, discomfort, or fever only as directed by your caregiver. SEEK IMMEDIATE MEDICAL CARE IF:  You have increasing pain or severe  headaches.  You have nausea, vomiting, or drowsiness.  You have swelling around your face.  You have vision problems.  You have a stiff neck.  You have difficulty breathing. MAKE SURE YOU:   Understand these instructions.  Will watch your condition.  Will get help right away if you are not doing well or get worse. Document Released: 07/13/2005 Document Revised: 10/05/2011 Document Reviewed: 07/28/2011 [facility] Patient Information 2014 [place], [place].

## 2013-04-28 ENCOUNTER — Encounter: Payer: Self-pay | Admitting: Family

## 2013-04-28 ENCOUNTER — Ambulatory Visit (INDEPENDENT_AMBULATORY_CARE_PROVIDER_SITE_OTHER): Payer: 59 | Admitting: Family

## 2013-04-28 VITALS — BP 148/84 | HR 88 | Temp 98.6°F | Resp 16 | Ht 67.25 in | Wt 232.0 lb

## 2013-04-28 DIAGNOSIS — J45909 Unspecified asthma, uncomplicated: Secondary | ICD-10-CM

## 2013-04-28 DIAGNOSIS — J209 Acute bronchitis, unspecified: Secondary | ICD-10-CM

## 2013-04-28 MED ORDER — AZITHROMYCIN 250 MG PO TABS: ORAL_TABLET | ORAL | Status: DC

## 2013-04-28 MED ORDER — PREDNISONE 10 MG PO TABS: ORAL_TABLET | ORAL | Status: DC

## 2013-04-28 MED ORDER — ALBUTEROL SULFATE HFA 108 (90 BASE) MCG/ACT IN AERS: 2.0000 | INHALATION_SPRAY | Freq: Four times a day (QID) | RESPIRATORY_TRACT | Status: DC | PRN

## 2013-04-28 NOTE — Progress Notes (Signed)
Subjective:    Patient ID: Connie Blanchard, female    DOB: 30-Nov-1970, 42 y.o.   MRN: [id number]  HPI  Connie Blanchard is a 42 yr old female who presents today with chief complaint of cough.  Cough has been present x 2 days. Denies associated fever.  Cough is described dry, associated with wheezing, and worse with laying flat.  Cough is associated with wheezing.  She has tried dayquil and used an albuterol inhaler.  The inhaler helped her to fall asleep. Dayquil took away body aches.  Prior to developing cough she reports a several week  History of nasal congestion which has not improved with nasal saline.   Review of Systems    see HPI  Past Medical History  Diagnosis Date  . GERD (gastroesophageal reflux disease)   . Obese   . Chronic sinusitis 02/2012    current runny nose of clear drainage  . Iron deficiency anemia   . Dental crowns present     History   Social History  . Marital Status: Married    Spouse Name: N/A    Number of Children: N/A  . Years of Education: N/A   Occupational History  . Not on file.   Social History Main Topics  . Smoking status: Former Smoker -- 1.00 packs/day for 19 years    Quit date: 07/28/2003  . Smokeless tobacco: Never Used  . Alcohol Use: Yes     Comment: occasionally  . Drug Use: No  . Sexual Activity: Yes    Partners: Male   Other Topics Concern  . Not on file   Social History Narrative  . No narrative on file    Past Surgical History  Procedure Laterality Date  . Dilation and curettage of uterus  1987  . Nasal septoplasty w/ turbinoplasty  03/23/2012    Procedure: NASAL SEPTOPLASTY WITH TURBINATE REDUCTION;  Surgeon: Osborn Coho, MD;  Location: [facility];  Service: ENT;  Laterality: Bilateral;  . Sinus endo w/fusion  03/23/2012    Procedure: ENDOSCOPIC SINUS SURGERY WITH FUSION NAVIGATION;  Surgeon: Osborn Coho, MD;  Location: [facility];  Service: ENT;  Laterality: Bilateral;  with fusion  scan    Family History  Problem Relation Age of Onset  . Diabetes Mother     type 2  . Hypertension Mother   . Depression Mother   . Mental illness Mother     bi-polar  . Pulmonary embolism Mother   . Heart disease Maternal Grandfather   . Stroke Maternal Grandfather   . Other Father     drug addiction  . Depression Maternal Aunt 49    suicide attempt with pneumonia    Allergies  Allergen Reactions  . Penicillins Rash  . Sulfa Antibiotics Rash    Current Outpatient Prescriptions on File Prior to Visit  Medication Sig Dispense Refill  . esomeprazole (NEXIUM) 40 MG capsule Take 1 capsule (40 mg total) by mouth daily before breakfast.  90 capsule  1  . ferrous fumarate (HEMOCYTE - 106 MG FE) 325 (106 FE) MG TABS Take 1 tablet (106 mg of iron total) by mouth daily.  90 each  1  . fluticasone (FLONASE) 50 MCG/ACT nasal spray Place 2 sprays into the nose daily.  16 g  6  . Multiple Vitamin (MULTIVITAMIN) tablet Take 1 tablet by mouth daily.        . Probiotic Product (MISC INTESTINAL FLORA REGULAT) CHEW as needed. Digestive Advantage probiotics by  Schiff      . sodium chloride (OCEAN) 0.65 % SOLN nasal spray Place 1 spray into the nose as needed for congestion.  1 Bottle  2   No current facility-administered medications on file prior to visit.    BP 148/84  Pulse 88  Temp(Src) 98.6 F (37 C) (Oral)  Resp 16  Ht 5' 7.25" (1.708 m)  Wt 232 lb (105.235 kg)  BMI 36.07 kg/m2  SpO2 96%  LMP 03/15/2013    Objective:   Physical Exam  Constitutional: She is oriented to person, place, and time. She appears well-developed and well-nourished. No distress.  HENT:  Head: Normocephalic and atraumatic.  Cardiovascular: Normal rate and regular rhythm.   No murmur heard. Pulmonary/Chest: Effort normal. No respiratory distress. She has wheezes. She has no rales. She exhibits no tenderness.  Bilateral wheezing noted  Musculoskeletal: She exhibits no edema.  Neurological: She is  alert and oriented to person, place, and time.  Psychiatric: She has a normal mood and affect. Her behavior is normal. Judgment and thought content normal.          Assessment & Plan:

## 2013-04-28 NOTE — Patient Instructions (Addendum)
Please start prednisone and Zpak.  Call if symptoms worsen or if not improved in 2-3 days.

## 2013-04-28 NOTE — Assessment & Plan Note (Signed)
Continue albuterol Q 4-6 hours. Add prednisone taper + zithromax.  Will need CXR if symptoms worsen or if not improved in 2-3 days.  Pt understands and will call us if symptoms do not improve.

## 2013-05-10 ENCOUNTER — Ambulatory Visit (INDEPENDENT_AMBULATORY_CARE_PROVIDER_SITE_OTHER): Payer: 59 | Admitting: Physician Assistant

## 2013-05-10 ENCOUNTER — Ambulatory Visit (HOSPITAL_BASED_OUTPATIENT_CLINIC_OR_DEPARTMENT_OTHER)
Admission: RE | Admit: 2013-05-10 | Discharge: 2013-05-10 | Disposition: A | Discharge status: Home / Self Care | Payer: 59 | Source: Ambulatory Visit | Attending: Physician Assistant | Admitting: Physician Assistant

## 2013-05-10 ENCOUNTER — Encounter: Payer: Self-pay | Admitting: Physician Assistant

## 2013-05-10 VITALS — BP 142/88 | HR 94 | Temp 98.3°F | Resp 18 | Ht 67.25 in | Wt 229.5 lb

## 2013-05-10 DIAGNOSIS — J209 Acute bronchitis, unspecified: Secondary | ICD-10-CM

## 2013-05-10 DIAGNOSIS — R062 Wheezing: Secondary | ICD-10-CM | POA: Insufficient documentation

## 2013-05-10 DIAGNOSIS — R059 Cough, unspecified: Secondary | ICD-10-CM | POA: Insufficient documentation

## 2013-05-10 DIAGNOSIS — R05 Cough: Secondary | ICD-10-CM | POA: Diagnosis present

## 2013-05-10 DIAGNOSIS — J45909 Unspecified asthma, uncomplicated: Secondary | ICD-10-CM

## 2013-05-10 MED ORDER — CEFDINIR 300 MG PO CAPS: 300.0000 mg | ORAL_CAPSULE | Freq: Two times a day (BID) | ORAL | Status: DC

## 2013-05-10 MED ORDER — PREDNISONE 20 MG PO TABS: ORAL_TABLET | ORAL | Status: DC

## 2013-05-10 MED ORDER — ALBUTEROL SULFATE (2.5 MG/3ML) 0.083% IN NEBU
2.5000 mg | INHALATION_SOLUTION | Freq: Once | RESPIRATORY_TRACT | Status: AC
  Administered 2013-05-10: 2.5 mg via RESPIRATORY_TRACT

## 2013-05-10 MED ORDER — HYDROCOD POLST-CHLORPHEN POLST 10-8 MG/5ML PO LQCR: 5.0000 mL | Freq: Two times a day (BID) | ORAL | Status: DC | PRN

## 2013-05-10 NOTE — Assessment & Plan Note (Addendum)
CXR within normal limits.  Rx Cefdinir.  Rx Prednisone burst.  Nebulizer treatment given in office.  Continue with asthma medications.  Rx Tussionex for cough.

## 2013-05-10 NOTE — Progress Notes (Signed)
Patient ID: Connie Blanchard, female   DOB: 10-25-1970, 42 y.o.   MRN: [id number]  Patient presents to clinic today c/o worsening cough and chest congestion despite antibiotic therapy.  Patient was seen previously by other provider, diagnosed with acute bronchitis and asthma exacerbation, and given Rx for prednisone taper and Z-Pack. Patient states symptoms initially improved but have since returned.  Endorses occasional wheeze and persistent dry cough.  Denies fever, chills, sinus pressure, sinus pain, ear ache, tooth ache, chest pain.  Some shortness of breath 2/2 coughing fits.  Has been using albuterol inhaler as needed.   Past Medical History  Diagnosis Date  . GERD (gastroesophageal reflux disease)   . Obese   . Chronic sinusitis 02/2012    current runny nose of clear drainage  . Iron deficiency anemia   . Dental crowns present     Current Outpatient Prescriptions on File Prior to Visit  Medication Sig Dispense Refill  . albuterol (PROVENTIL HFA;VENTOLIN HFA) 108 (90 BASE) MCG/ACT inhaler Inhale 2 puffs into the lungs every 6 (six) hours as needed for wheezing.  1 Inhaler  0  . esomeprazole (NEXIUM) 40 MG capsule Take 1 capsule (40 mg total) by mouth daily before breakfast.  90 capsule  1  . ferrous fumarate (HEMOCYTE - 106 MG FE) 325 (106 FE) MG TABS Take 1 tablet (106 mg of iron total) by mouth daily.  90 each  1  . fluticasone (FLONASE) 50 MCG/ACT nasal spray Place 2 sprays into the nose daily.  16 g  6  . Multiple Vitamin (MULTIVITAMIN) tablet Take 1 tablet by mouth daily.        . Probiotic Product (MISC INTESTINAL FLORA REGULAT) CHEW as needed. Digestive Advantage probiotics by Schiff      . sodium chloride (OCEAN) 0.65 % SOLN nasal spray Place 1 spray into the nose as needed for congestion.  1 Bottle  2   No current facility-administered medications on file prior to visit.    Allergies  Allergen Reactions  . Penicillins Rash  . Sulfa Antibiotics Rash    Family History   Problem Relation Age of Onset  . Diabetes Mother     type 2  . Hypertension Mother   . Depression Mother   . Mental illness Mother     bi-polar  . Pulmonary embolism Mother   . Heart disease Maternal Grandfather   . Stroke Maternal Grandfather   . Other Father     drug addiction  . Depression Maternal Aunt 52    suicide attempt with pneumonia    History   Social History  . Marital Status: Married    Spouse Name: N/A    Number of Children: N/A  . Years of Education: N/A   Social History Main Topics  . Smoking status: Former Smoker -- 1.00 packs/day for 19 years    Quit date: 07/28/2003  . Smokeless tobacco: Never Used  . Alcohol Use: Yes     Comment: occasionally  . Drug Use: No  . Sexual Activity: Yes    Partners: Male   Other Topics Concern  . None   Social History Narrative  . None   Review of Systems  Constitutional: Positive for malaise/fatigue. Negative for fever, chills and weight loss.  HENT: Negative for congestion and sore throat.   Respiratory: Positive for cough, shortness of breath and wheezing. Negative for hemoptysis and sputum production.   Cardiovascular: Negative for chest pain and palpitations.  Gastrointestinal: Negative for nausea, vomiting  and abdominal pain.  Musculoskeletal: Negative for myalgias.  Neurological: Negative for dizziness and headaches.   Filed Vitals:   05/10/13 1627  BP: 142/88  Pulse: 94  Temp: 98.3 F (36.8 C)  Resp: 18   Physical Exam  Constitutional: She is oriented to person, place, and time and well-developed, well-nourished, and in no distress.  HENT:  Head: Normocephalic and atraumatic.  Right Ear: External ear normal.  Left Ear: External ear normal.  Nose: Nose normal.  Mouth/Throat: Oropharynx is clear and moist. No oropharyngeal exudate.  Eyes: Conjunctivae are normal.  Neck: Neck supple.  Cardiovascular: Normal rate, regular rhythm, normal heart sounds and intact distal pulses.   Pulmonary/Chest:  Effort normal. No respiratory distress. She has wheezes. She has no rales. She exhibits no tenderness.  Lymphadenopathy:    She has no cervical adenopathy.  Neurological: She is alert and oriented to person, place, and time.  Skin: Skin is warm and dry. No rash noted.   Assessment/Plan: Bronchitis with asthma, acute CXR within normal limits.  Rx Cefdinir.  Rx Prednisone burst.  Nebulizer treatment given in office.  Continue with asthma medications.  Rx Tussionex for cough.

## 2013-05-10 NOTE — Patient Instructions (Signed)
Take antibiotic as prescribed until all pills are gone.  Take steroid burst as prescribed.  Tussionex every 12 hours as needed for cough.  Continue with albuterol inhaler.  Mucinex and daily probiotic.  If symptoms are not improving, we will need to reassess.

## 2013-05-25 ENCOUNTER — Ambulatory Visit (INDEPENDENT_AMBULATORY_CARE_PROVIDER_SITE_OTHER): Payer: 59 | Admitting: Family Medicine

## 2013-05-25 ENCOUNTER — Encounter: Payer: Self-pay | Admitting: Family Medicine

## 2013-05-25 VITALS — BP 138/92 | HR 82 | Temp 98.7°F | Ht 67.25 in | Wt 225.0 lb

## 2013-05-25 DIAGNOSIS — J209 Acute bronchitis, unspecified: Secondary | ICD-10-CM

## 2013-05-25 DIAGNOSIS — J45909 Unspecified asthma, uncomplicated: Secondary | ICD-10-CM

## 2013-05-25 MED ORDER — PREDNISONE 5 MG PO TABS: 5.0000 mg | ORAL_TABLET | Freq: Every day | ORAL | Status: DC

## 2013-05-25 MED ORDER — DOXYCYCLINE HYCLATE 50 MG PO CAPS: 50.0000 mg | ORAL_CAPSULE | Freq: Two times a day (BID) | ORAL | Status: DC

## 2013-05-25 MED ORDER — BENZONATATE 200 MG PO CAPS: 200.0000 mg | ORAL_CAPSULE | Freq: Two times a day (BID) | ORAL | Status: DC | PRN

## 2013-05-25 MED ORDER — PREDNISONE 20 MG PO TABS: ORAL_TABLET | ORAL | Status: DC

## 2013-05-25 MED ORDER — METHYLPREDNISOLONE ACETATE 40 MG/ML IJ SUSP
40.0000 mg | Freq: Once | INTRAMUSCULAR | Status: AC
  Administered 2013-05-25: 40 mg via INTRAMUSCULAR

## 2013-05-25 MED ORDER — HYDROCOD POLST-CHLORPHEN POLST 10-8 MG/5ML PO LQCR: 5.0000 mL | Freq: Two times a day (BID) | ORAL | Status: DC | PRN

## 2013-05-25 MED ORDER — FLUTICASONE-SALMETEROL 250-50 MCG/DOSE IN AEPB: 1.0000 | INHALATION_SPRAY | Freq: Two times a day (BID) | RESPIRATORY_TRACT | Status: DC

## 2013-05-25 NOTE — Patient Instructions (Signed)

## 2013-05-28 ENCOUNTER — Encounter: Payer: Self-pay | Admitting: Family Medicine

## 2013-05-28 NOTE — Progress Notes (Signed)
Patient ID: Connie Blanchard, female   DOB: 1970-12-16, 42 y.o.   MRN: [id number] ERICCA LABRA [id number] Aug 12, 1970 05/28/2013      Progress Note-Follow Up  Subjective  Chief Complaint  Chief Complaint  Patient presents with  . Bronchitis    diagnosed a couple of weeks ago    HPI  Patient is a 42 year old Caucasian female who has been struggling with bronchitis for several weeks now. He responds partially to therapy and if symptoms worsen again. Was given a course of azithromycin and steroids but worsened again once they were finished. She denies fevers chills, headache or chest pain. Does have congestion and cough productive of yellow phlegm. Cough is worse each bedtime and with exertion. No diarrhea or abdominal pain.  Past Medical History  Diagnosis Date  . GERD (gastroesophageal reflux disease)   . Obese   . Chronic sinusitis 02/2012    current runny nose of clear drainage  . Iron deficiency anemia   . Dental crowns present     Past Surgical History  Procedure Laterality Date  . Dilation and curettage of uterus  1987  . Nasal septoplasty w/ turbinoplasty  03/23/2012    Procedure: NASAL SEPTOPLASTY WITH TURBINATE REDUCTION;  Surgeon: Osborn Coho, MD;  Location: [facility];  Service: ENT;  Laterality: Bilateral;  . Sinus endo w/fusion  03/23/2012    Procedure: ENDOSCOPIC SINUS SURGERY WITH FUSION NAVIGATION;  Surgeon: Osborn Coho, MD;  Location: [facility];  Service: ENT;  Laterality: Bilateral;  with fusion scan    Family History  Problem Relation Age of Onset  . Diabetes Mother     type 2  . Hypertension Mother   . Depression Mother   . Mental illness Mother     bi-polar  . Pulmonary embolism Mother   . Heart disease Maternal Grandfather   . Stroke Maternal Grandfather   . Other Father     drug addiction  . Depression Maternal Aunt 76    suicide attempt with pneumonia    History   Social History  . Marital Status:  Married    Spouse Name: N/A    Number of Children: N/A  . Years of Education: N/A   Occupational History  . Not on file.   Social History Main Topics  . Smoking status: Former Smoker -- 1.00 packs/day for 19 years    Quit date: 07/28/2003  . Smokeless tobacco: Never Used  . Alcohol Use: Yes     Comment: occasionally  . Drug Use: No  . Sexual Activity: Yes    Partners: Male   Other Topics Concern  . Not on file   Social History Narrative  . No narrative on file    Current Outpatient Prescriptions on File Prior to Visit  Medication Sig Dispense Refill  . albuterol (PROVENTIL HFA;VENTOLIN HFA) 108 (90 BASE) MCG/ACT inhaler Inhale 2 puffs into the lungs every 6 (six) hours as needed for wheezing.  1 Inhaler  0  . esomeprazole (NEXIUM) 40 MG capsule Take 1 capsule (40 mg total) by mouth daily before breakfast.  90 capsule  1  . ferrous fumarate (HEMOCYTE - 106 MG FE) 325 (106 FE) MG TABS Take 1 tablet (106 mg of iron total) by mouth daily.  90 each  1  . fluticasone (FLONASE) 50 MCG/ACT nasal spray Place 2 sprays into the nose daily.  16 g  6  . Multiple Vitamin (MULTIVITAMIN) tablet Take 1 tablet by mouth daily.        Marland Kitchen  Probiotic Product (MISC INTESTINAL FLORA REGULAT) CHEW as needed. Digestive Advantage probiotics by Schiff      . sodium chloride (OCEAN) 0.65 % SOLN nasal spray Place 1 spray into the nose as needed for congestion.  1 Bottle  2   No current facility-administered medications on file prior to visit.    Allergies  Allergen Reactions  . Penicillins Rash  . Sulfa Antibiotics Rash    Review of Systems  Review of Systems  Constitutional: Negative for fever and malaise/fatigue.  HENT: Positive for congestion.   Eyes: Negative for discharge.  Respiratory: Positive for cough and sputum production. Negative for shortness of breath.   Cardiovascular: Negative for chest pain, palpitations and leg swelling.  Gastrointestinal: Positive for nausea. Negative for  vomiting, abdominal pain and diarrhea.  Genitourinary: Negative for dysuria.  Musculoskeletal: Negative for falls.  Skin: Negative for rash.  Neurological: Negative for loss of consciousness and headaches.  Endo/Heme/Allergies: Negative for polydipsia.  Psychiatric/Behavioral: Negative for depression and suicidal ideas. The patient is not nervous/anxious and does not have insomnia.     Objective  BP 138/92  Pulse 82  Temp(Src) 98.7 F (37.1 C) (Oral)  Ht 5' 7.25" (1.708 m)  Wt 225 lb (102.059 kg)  BMI 34.98 kg/m2  SpO2 95%  LMP 05/09/2013  Physical Exam  Physical Exam  Constitutional: She is oriented to person, place, and time and well-developed, well-nourished, and in no distress. No distress.  HENT:  Head: Normocephalic and atraumatic.  Eyes: Conjunctivae are normal.  Neck: Neck supple. No thyromegaly present.  Cardiovascular: Normal rate, regular rhythm and normal heart sounds.   No murmur heard. Pulmonary/Chest: Effort normal. She has wheezes.  Expiratory wheezing  Abdominal: She exhibits no distension and no mass.  Musculoskeletal: She exhibits no edema.  Lymphadenopathy:    She has no cervical adenopathy.  Neurological: She is alert and oriented to person, place, and time.  Skin: Skin is warm and dry. No rash noted. She is not diaphoretic.  Psychiatric: Memory, affect and judgment normal.    Lab Results  Component Value Date   TSH 2.00 07/29/2011   Lab Results  Component Value Date   WBC 7.7 10/29/2011   HGB 13.2 03/23/2012   HCT 35.9* 10/29/2011   MCV 80.1 10/29/2011   PLT 332 10/29/2011   Lab Results  Component Value Date   CREATININE 0.8 07/29/2011   BUN 11 07/29/2011   NA 140 07/29/2011   K 4.1 07/29/2011   CL 106 07/29/2011   CO2 29 07/29/2011   Lab Results  Component Value Date   ALT 19 07/29/2011   AST 21 07/29/2011   ALKPHOS 46 07/29/2011   BILITOT 0.2* 07/29/2011   Lab Results  Component Value Date   CHOL 194 07/29/2011   Lab Results  Component Value Date    HDL 58.90 07/29/2011   Lab Results  Component Value Date   LDLCALC 121* 07/29/2011   Lab Results  Component Value Date   TRIG 69.0 07/29/2011   Lab Results  Component Value Date   CHOLHDL 3 07/29/2011     Assessment & Plan  Bronchitis with asthma, acute Started on Advair 250/50 1 puff po bid, Albuterol prn. Slow steroid taper and will only restart antibiotics if symptoms worsen.

## 2013-05-28 NOTE — Assessment & Plan Note (Signed)
Started on Advair 250/50 1 puff po bid, Albuterol prn. Slow steroid taper and will only restart antibiotics if symptoms worsen.

## 2013-05-30 ENCOUNTER — Other Ambulatory Visit: Payer: Self-pay | Admitting: Family Medicine

## 2013-05-30 ENCOUNTER — Telehealth: Payer: Self-pay | Admitting: Family Medicine

## 2013-05-30 NOTE — Telephone Encounter (Signed)
Patient states that she has been taking prenisone and now has a fever blister. She would like to know if Dr. Abner Greenspan could call in something to [facility]?

## 2013-05-30 NOTE — Telephone Encounter (Signed)
Refill done.  

## 2013-05-31 NOTE — Telephone Encounter (Signed)
Please advise 

## 2013-05-31 NOTE — Telephone Encounter (Signed)
She can have acyclovir 400 mg po tid x 7 days if no improvement needs to come in for eval

## 2013-06-01 ENCOUNTER — Other Ambulatory Visit: Payer: Self-pay

## 2013-06-01 MED ORDER — ESOMEPRAZOLE MAGNESIUM 40 MG PO CPDR: DELAYED_RELEASE_CAPSULE | ORAL | Status: DC

## 2013-06-01 MED ORDER — ACYCLOVIR 400 MG PO TABS: 400.0000 mg | ORAL_TABLET | Freq: Three times a day (TID) | ORAL | Status: DC

## 2013-06-01 NOTE — Telephone Encounter (Signed)
Notify should start Doxycycline

## 2013-06-01 NOTE — Telephone Encounter (Signed)
Left a detailed message on cell phone

## 2013-06-01 NOTE — Telephone Encounter (Signed)
Please advise patient? Patient stated that she was feeling better with no wheezing or couching until yesterday. Patient started wheezing again and she is still coughing up yellowish-brown fleam. Patient wants to know if she should go ahead and get the doxycycline rx filled? Resent rx for nexium to [facility] along with rx for acyclovir.

## 2013-06-06 ENCOUNTER — Other Ambulatory Visit: Payer: Self-pay | Admitting: *Deleted

## 2013-06-06 DIAGNOSIS — J209 Acute bronchitis, unspecified: Secondary | ICD-10-CM

## 2013-06-06 MED ORDER — FLUTICASONE-SALMETEROL 250-50 MCG/DOSE IN AEPB: 1.0000 | INHALATION_SPRAY | Freq: Two times a day (BID) | RESPIRATORY_TRACT | Status: DC

## 2013-06-06 NOTE — Telephone Encounter (Signed)
Rx request to pharmacy/[facility]  

## 2013-06-07 ENCOUNTER — Telehealth: Payer: Self-pay

## 2013-06-07 DIAGNOSIS — J209 Acute bronchitis, unspecified: Secondary | ICD-10-CM

## 2013-06-07 MED ORDER — ESOMEPRAZOLE MAGNESIUM 40 MG PO CPDR: DELAYED_RELEASE_CAPSULE | ORAL | Status: DC

## 2013-06-07 MED ORDER — FLUTICASONE-SALMETEROL 250-50 MCG/DOSE IN AEPB: 1.0000 | INHALATION_SPRAY | Freq: Two times a day (BID) | RESPIRATORY_TRACT | Status: DC

## 2013-06-07 MED ORDER — ACYCLOVIR 400 MG PO TABS: 400.0000 mg | ORAL_TABLET | Freq: Three times a day (TID) | ORAL | Status: AC

## 2013-06-07 NOTE — Telephone Encounter (Signed)
Patient called stating that samples of advair were finished and would like a prescription.  Also, Patient stated that her new pharmacy is at [facility].   Please advise on the medication, Thanks!

## 2013-06-07 NOTE — Telephone Encounter (Signed)
OK to send rx of Advair 250/50 1 puff po bid disp #1 with 3 rf

## 2013-06-07 NOTE — Telephone Encounter (Signed)
Patients Advair, Nexium, and Acyclovir reordered and sent to new pharmacy [facility] per patient request.

## 2013-06-08 ENCOUNTER — Encounter: Payer: Self-pay | Admitting: Family Medicine

## 2013-06-13 NOTE — Telephone Encounter (Signed)
Message copied by Court Joy on Tue Jun 13, 2013 11:28 AM ------      Message from: Danise Edge A      Created: Mon Jun 12, 2013  9:16 PM       Notify I cannot call the insurance  Co and it likely would not help any way.they will likely make her try something else before they will let her have it back.  So enter Omeprazol  And try this first if no help or do thot tolerated wil have e\]\ to  ------

## 2013-06-13 NOTE — Telephone Encounter (Signed)
Patient states she has tried Aciphex in the past

## 2013-06-13 NOTE — Telephone Encounter (Signed)
Pt states she is going to call the pharmacy to get them to send [place] the information

## 2013-08-17 ENCOUNTER — Telehealth: Payer: Self-pay | Admitting: Family Medicine

## 2013-08-17 DIAGNOSIS — J209 Acute bronchitis, unspecified: Secondary | ICD-10-CM

## 2013-08-17 MED ORDER — FLUTICASONE-SALMETEROL 250-50 MCG/DOSE IN AEPB: 1.0000 | INHALATION_SPRAY | Freq: Two times a day (BID) | RESPIRATORY_TRACT | Status: DC

## 2013-08-17 NOTE — Telephone Encounter (Signed)
Patient states that she was given samples of advair at last visit but would like an rx [facility]

## 2013-08-17 NOTE — Telephone Encounter (Signed)
Med filled.  

## 2013-08-22 ENCOUNTER — Encounter: Payer: Self-pay | Admitting: Family Medicine

## 2013-08-23 ENCOUNTER — Other Ambulatory Visit: Payer: Self-pay | Admitting: Family Medicine

## 2013-08-23 DIAGNOSIS — J209 Acute bronchitis, unspecified: Secondary | ICD-10-CM

## 2013-08-23 MED ORDER — FLUTICASONE-SALMETEROL 250-50 MCG/DOSE IN AEPB: 1.0000 | INHALATION_SPRAY | Freq: Two times a day (BID) | RESPIRATORY_TRACT | Status: DC

## 2013-10-12 ENCOUNTER — Encounter: Payer: Self-pay | Admitting: Family Medicine

## 2013-10-12 ENCOUNTER — Ambulatory Visit (INDEPENDENT_AMBULATORY_CARE_PROVIDER_SITE_OTHER): Payer: 59 | Admitting: Family Medicine

## 2013-10-12 VITALS — BP 122/92 | HR 66 | Temp 98.1°F | Ht 67.0 in | Wt 228.1 lb

## 2013-10-12 DIAGNOSIS — M549 Dorsalgia, unspecified: Secondary | ICD-10-CM

## 2013-10-12 DIAGNOSIS — Z0000 Encounter for general adult medical examination without abnormal findings: Secondary | ICD-10-CM

## 2013-10-12 DIAGNOSIS — E669 Obesity, unspecified: Secondary | ICD-10-CM

## 2013-10-12 DIAGNOSIS — T753XXA Motion sickness, initial encounter: Secondary | ICD-10-CM

## 2013-10-12 DIAGNOSIS — E785 Hyperlipidemia, unspecified: Secondary | ICD-10-CM

## 2013-10-12 DIAGNOSIS — D649 Anemia, unspecified: Secondary | ICD-10-CM

## 2013-10-12 DIAGNOSIS — R079 Chest pain, unspecified: Secondary | ICD-10-CM

## 2013-10-12 DIAGNOSIS — T7840XA Allergy, unspecified, initial encounter: Secondary | ICD-10-CM

## 2013-10-12 LAB — RENAL FUNCTION PANEL
Albumin: 4.2 g/dL (ref 3.5–5.2)
BUN: 9 mg/dL (ref 6–23)
CHLORIDE: 102 meq/L (ref 96–112)
CO2: 29 meq/L (ref 19–32)
Calcium: 9 mg/dL (ref 8.4–10.5)
Creat: 0.88 mg/dL (ref 0.50–1.10)
Glucose, Bld: 80 mg/dL (ref 70–99)
PHOSPHORUS: 3.1 mg/dL (ref 2.3–4.6)
Potassium: 4.1 mEq/L (ref 3.5–5.3)
Sodium: 140 mEq/L (ref 135–145)

## 2013-10-12 LAB — CBC
HCT: 38.3 % (ref 36.0–46.0)
HEMOGLOBIN: 12.6 g/dL (ref 12.0–15.0)
MCH: 27.3 pg (ref 26.0–34.0)
MCHC: 32.9 g/dL (ref 30.0–36.0)
MCV: 83.1 fL (ref 78.0–100.0)
Platelets: 254 10*3/uL (ref 150–400)
RBC: 4.61 MIL/uL (ref 3.87–5.11)
RDW: 15.7 % — AB (ref 11.5–15.5)
WBC: 7 10*3/uL (ref 4.0–10.5)

## 2013-10-12 LAB — LIPID PANEL
Cholesterol: 170 mg/dL (ref 0–200)
HDL: 54 mg/dL (ref >39)
LDL CALC: 96 mg/dL (ref 0–99)
Total CHOL/HDL Ratio: 3.1 Ratio
Triglycerides: 98 mg/dL (ref <150)
VLDL: 20 mg/dL (ref 0–40)

## 2013-10-12 LAB — HEPATIC FUNCTION PANEL
ALT: 18 U/L (ref 0–35)
AST: 17 U/L (ref 0–37)
Albumin: 4.2 g/dL (ref 3.5–5.2)
Alkaline Phosphatase: 50 U/L (ref 39–117)
BILIRUBIN TOTAL: 0.4 mg/dL (ref 0.2–1.2)
Bilirubin, Direct: 0.1 mg/dL (ref 0.0–0.3)
Indirect Bilirubin: 0.3 mg/dL (ref 0.2–1.2)
Total Protein: 7.6 g/dL (ref 6.0–8.3)

## 2013-10-12 LAB — TSH: TSH: 2.906 u[IU]/mL (ref 0.350–4.500)

## 2013-10-12 MED ORDER — SCOPOLAMINE 1 MG/3DAYS TD PT72: 1.0000 | MEDICATED_PATCH | TRANSDERMAL | Status: DC

## 2013-10-12 NOTE — Progress Notes (Signed)
Pre visit review using our clinic review tool, if applicable. No additional management support is needed unless otherwise documented below in the visit note. 

## 2013-10-12 NOTE — Patient Instructions (Signed)
Salon Pas or Icy Hot patches or cream Darcy Ward is a chiropractor   Back Pain, Adult Low back pain is very common. About 1 in 5 people have back pain.The cause of low back pain is rarely dangerous. The pain often gets better over time.About half of people with a sudden onset of back pain feel better in just 2 weeks. About 8 in 10 people feel better by 6 weeks.  CAUSES Some common causes of back pain include:  Strain of the muscles or ligaments supporting the spine.  Wear and tear (degeneration) of the spinal discs.  Arthritis.  Direct injury to the back. DIAGNOSIS Most of the time, the direct cause of low back pain is not known.However, back pain can be treated effectively even when the exact cause of the pain is unknown.Answering your caregiver's questions about your overall health and symptoms is one of the most accurate ways to make sure the cause of your pain is not dangerous. If your caregiver needs more information, he or she may order lab work or imaging tests (X-rays or MRIs).However, even if imaging tests show changes in your back, this usually does not require surgery. HOME CARE INSTRUCTIONS For many people, back pain returns.Since low back pain is rarely dangerous, it is often a condition that people can learn to [facility] their own.   Remain active. It is stressful on the back to sit or stand in one place. Do not sit, drive, or stand in one place for more than 30 minutes at a time. Take short walks on level surfaces as soon as pain allows.Try to increase the length of time you walk each day.  Do not stay in bed.Resting more than 1 or 2 days can delay your recovery.  Do not avoid exercise or work.Your body is made to move.It is not dangerous to be active, even though your back may hurt.Your back will likely heal faster if you return to being active before your pain is gone.  Pay attention to your body when you bend and lift. Many people have less discomfortwhen  lifting if they bend their knees, keep the load close to their bodies,and avoid twisting. Often, the most comfortable positions are those that put less stress on your recovering back.  Find a comfortable position to sleep. Use a firm mattress and lie on your side with your knees slightly bent. If you lie on your back, put a pillow under your knees.  Only take over-the-counter or prescription medicines as directed by your caregiver. Over-the-counter medicines to reduce pain and inflammation are often the most helpful.Your caregiver may prescribe muscle relaxant drugs.These medicines help dull your pain so you can more quickly return to your normal activities and healthy exercise.  Put ice on the injured area.  Put ice in a plastic bag.  Place a towel between your skin and the bag.  Leave the ice on for 15-20 minutes, 03-04 times a day for the first 2 to 3 days. After that, ice and heat may be alternated to reduce pain and spasms.  Ask your caregiver about trying back exercises and gentle massage. This may be of some benefit.  Avoid feeling anxious or stressed.Stress increases muscle tension and can worsen back pain.It is important to recognize when you are anxious or stressed and learn ways to manage it.Exercise is a great option. SEEK MEDICAL CARE IF:  You have pain that is not relieved with rest or medicine.  You have pain that does not improve in  1 week.  You have new symptoms.  You are generally not feeling well. SEEK IMMEDIATE MEDICAL CARE IF:   You have pain that radiates from your back into your legs.  You develop new bowel or bladder control problems.  You have unusual weakness or numbness in your arms or legs.  You develop nausea or vomiting.  You develop abdominal pain.  You feel faint. Document Released: 07/13/2005 Document Revised: 01/12/2012 Document Reviewed: 12/01/2010 [facility] Patient Information 2014 [place], [place].

## 2013-10-15 ENCOUNTER — Encounter: Payer: Self-pay | Admitting: Family Medicine

## 2013-10-15 DIAGNOSIS — R110 Nausea: Secondary | ICD-10-CM

## 2013-10-15 DIAGNOSIS — M549 Dorsalgia, unspecified: Secondary | ICD-10-CM

## 2013-10-15 DIAGNOSIS — T753XXA Motion sickness, initial encounter: Secondary | ICD-10-CM | POA: Insufficient documentation

## 2013-10-15 DIAGNOSIS — T7840XA Allergy, unspecified, initial encounter: Secondary | ICD-10-CM

## 2013-10-15 HISTORY — DX: Nausea: R11.0

## 2013-10-15 HISTORY — DX: Allergy, unspecified, initial encounter: T78.40XA

## 2013-10-15 HISTORY — DX: Dorsalgia, unspecified: M54.9

## 2013-10-15 NOTE — Assessment & Plan Note (Signed)
flonase and antihistamines prn

## 2013-10-15 NOTE — Assessment & Plan Note (Signed)
Encouraged moist heat and gentle stretching as tolerated. May try NSAIDs and prescription meds as directed and report if symptoms worsen or seek immediate care. Salon Pas patches prn

## 2013-10-15 NOTE — Assessment & Plan Note (Signed)
Encouraged DASH diet, decrease po intake and increase exercise as tolerated. Needs 7-8 hours of sleep nightly. Avoid trans fats, eat small, frequent meals every 4-5 hours with lean proteins, complex carbs and healthy fats. Minimize simple carbs, GMO foods. 

## 2013-10-15 NOTE — Assessment & Plan Note (Signed)
Patient encouraged to maintain heart healthy diet, regular exercise, adequate sleep. Consider daily probiotics. Take medications as prescribed 

## 2013-10-15 NOTE — Assessment & Plan Note (Signed)
scopalamine patches to use for upcoming cruise

## 2013-10-15 NOTE — Assessment & Plan Note (Signed)
resolved 

## 2013-10-15 NOTE — Progress Notes (Signed)
Patient ID: TRENITA HULME, female   DOB: [birth year], 43 y.o.   MRN: [id number] NAELA NODAL [id number] 10-18-1970 10/15/2013      Progress Note-Follow Up  Subjective  Chief Complaint  Chief Complaint  Patient presents with  . Annual Exam    physical    HPI  Patient is a 43 year old female in today for routine medical care. Patient is in today noting intermittent trouble with congestion and cough as well as some intermittent wheezing for several months. Doing better at the present time. Has her mammogram scheduled for next week along with her GYN exam. Generally doing well. He has had some low-grade back pain intermittently which is coughing. No other recent illness. Denies CP/palp/SOB/HA/congestion/fevers/GI or GU c/o. Taking meds as prescribed  Past Medical History  Diagnosis Date  . GERD (gastroesophageal reflux disease)   . Obese   . Chronic sinusitis 02/2012    current runny nose of clear drainage  . Iron deficiency anemia   . Dental crowns present   . Acute bronchitis with asthma 06/24/2012  . Acute bronchitis 06/24/2012  . Allergic state 10/15/2013    Past Surgical History  Procedure Laterality Date  . Dilation and curettage of uterus  1987  . Nasal septoplasty w/ turbinoplasty  03/23/2012    Procedure: NASAL SEPTOPLASTY WITH TURBINATE REDUCTION;  Surgeon: Jerrell Belfast, MD;  Location: [facility];  Service: ENT;  Laterality: Bilateral;  . Sinus endo w/fusion  03/23/2012    Procedure: ENDOSCOPIC SINUS SURGERY WITH FUSION NAVIGATION;  Surgeon: Jerrell Belfast, MD;  Location: [place];  Service: ENT;  Laterality: Bilateral;  with fusion scan    Family History  Problem Relation Age of Onset  . Diabetes Mother     type 2  . Hypertension Mother   . Depression Mother   . Mental illness Mother     bi-polar  . Pulmonary embolism Mother   . Heart disease Maternal Grandfather   . Stroke Maternal Grandfather   . Other Father     drug  addiction  . Depression Maternal Aunt 46    suicide attempt with pneumonia    History   Social History  . Marital Status: Married    Spouse Name: N/A    Number of Children: N/A  . Years of Education: N/A   Occupational History  . Not on file.   Social History Main Topics  . Smoking status: Former Smoker -- 1.00 packs/day for 19 years    Quit date: 07/28/2003  . Smokeless tobacco: Never Used  . Alcohol Use: Yes     Comment: occasionally  . Drug Use: No  . Sexual Activity: Yes    Partners: Male     Comment: lives with husband, stepson 66, son and daughter. no dietary restrictions   Other Topics Concern  . Not on file   Social History Narrative  . No narrative on file    Current Outpatient Prescriptions on File Prior to Visit  Medication Sig Dispense Refill  . albuterol (PROVENTIL HFA;VENTOLIN HFA) 108 (90 BASE) MCG/ACT inhaler Inhale 2 puffs into the lungs every 6 (six) hours as needed for wheezing.  1 Inhaler  0  . esomeprazole (NEXIUM) 40 MG capsule TAKE 1 CAPSULE BY MOUTH DAILY BEFORE BREAKFAST.  90 capsule  0  . ferrous fumarate (HEMOCYTE - 106 MG FE) 325 (106 FE) MG TABS Take 1 tablet (106 mg of iron total) by mouth daily.  90 each  1  .  fluticasone (FLONASE) 50 MCG/ACT nasal spray Place 2 sprays into the nose daily.  16 g  6  . Fluticasone-Salmeterol (ADVAIR DISKUS) 250-50 MCG/DOSE AEPB Inhale 1 puff into the lungs 2 (two) times daily.  60 each  1  . Multiple Vitamin (MULTIVITAMIN) tablet Take 1 tablet by mouth daily.        . Probiotic Product (MISC INTESTINAL FLORA REGULAT) CHEW as needed. Digestive Advantage probiotics by Schiff      . sodium chloride (OCEAN) 0.65 % SOLN nasal spray Place 1 spray into the nose as needed for congestion.  1 Bottle  2   No current facility-administered medications on file prior to visit.    Allergies  Allergen Reactions  . Penicillins Rash  . Sulfa Antibiotics Rash    Review of Systems  Review of Systems  Constitutional:  Negative for fever and malaise/fatigue.  HENT: Positive for congestion.   Eyes: Negative for discharge.  Respiratory: Positive for cough. Negative for shortness of breath.   Cardiovascular: Negative for chest pain, palpitations and leg swelling.  Gastrointestinal: Negative for nausea, abdominal pain and diarrhea.  Genitourinary: Negative for dysuria.  Musculoskeletal: Negative for falls.  Skin: Negative for rash.  Neurological: Negative for loss of consciousness and headaches.  Endo/Heme/Allergies: Negative for polydipsia.  Psychiatric/Behavioral: Negative for depression and suicidal ideas. The patient is not nervous/anxious and does not have insomnia.     Objective  BP 122/92  Pulse 66  Temp(Src) 98.1 F (36.7 C) (Oral)  Ht 5\' 7"  (1.702 m)  Wt 228 lb 1.3 oz (103.456 kg)  BMI 35.71 kg/m2  SpO2 98%  LMP 09/18/2013  Physical Exam  Physical Exam  Constitutional: She is oriented to person, place, and time and well-developed, well-nourished, and in no distress. No distress.  HENT:  Head: Normocephalic and atraumatic.  Eyes: Conjunctivae are normal.  Neck: Neck supple. No thyromegaly present.  Cardiovascular: Normal rate, regular rhythm and normal heart sounds.   No murmur heard. Pulmonary/Chest: Effort normal and breath sounds normal. She has no wheezes.  Abdominal: Soft. Bowel sounds are normal. She exhibits no distension and no mass.  Musculoskeletal: Normal range of motion. She exhibits no edema and no tenderness.  Lymphadenopathy:    She has no cervical adenopathy.  Neurological: She is alert and oriented to person, place, and time.  Skin: Skin is warm and dry. No rash noted. She is not diaphoretic.  Psychiatric: Memory, affect and judgment normal.    Lab Results  Component Value Date   TSH 2.906 10/12/2013   Lab Results  Component Value Date   WBC 7.0 10/12/2013   HGB 12.6 10/12/2013   HCT 38.3 10/12/2013   MCV 83.1 10/12/2013   PLT 254 10/12/2013   Lab Results   Component Value Date   CREATININE 0.88 10/12/2013   BUN 9 10/12/2013   NA 140 10/12/2013   K 4.1 10/12/2013   CL 102 10/12/2013   CO2 29 10/12/2013   Lab Results  Component Value Date   ALT 18 10/12/2013   AST 17 10/12/2013   ALKPHOS 50 10/12/2013   BILITOT 0.4 10/12/2013   Lab Results  Component Value Date   CHOL 170 10/12/2013   Lab Results  Component Value Date   HDL 54 10/12/2013   Lab Results  Component Value Date   LDLCALC 96 10/12/2013   Lab Results  Component Value Date   TRIG 98 10/12/2013   Lab Results  Component Value Date   CHOLHDL 3.1 10/12/2013  Assessment & Plan  Hyperlipidemia Encouraged heart healthy diet, increase exercise, avoid trans fats, consider a krill oil cap daily  Anemia resolved  Motion sickness scopalamine patches to use for upcoming cruise  Obese Encouraged DASH diet, decrease po intake and increase exercise as tolerated. Needs 7-8 hours of sleep nightly. Avoid trans fats, eat small, frequent meals every 4-5 hours with lean proteins, complex carbs and healthy fats. Minimize simple carbs, GMO foods.  Preventative health care Patient encouraged to maintain heart healthy diet, regular exercise, adequate sleep. Consider daily probiotics. Take medications as prescribed  Allergic state flonase and antihistamines prn  Back pain Encouraged moist heat and gentle stretching as tolerated. May try NSAIDs and prescription meds as directed and report if symptoms worsen or seek immediate care. Salon Pas patches prn

## 2013-10-15 NOTE — Assessment & Plan Note (Signed)
Encouraged heart healthy diet, increase exercise, avoid trans fats, consider a krill oil cap daily 

## 2013-10-23 ENCOUNTER — Telehealth: Payer: Self-pay | Admitting: Family Medicine

## 2013-10-23 DIAGNOSIS — J209 Acute bronchitis, unspecified: Secondary | ICD-10-CM

## 2013-10-23 MED ORDER — FLUTICASONE-SALMETEROL 250-50 MCG/DOSE IN AEPB: 1.0000 | INHALATION_SPRAY | Freq: Two times a day (BID) | RESPIRATORY_TRACT | Status: DC

## 2013-10-23 NOTE — Telephone Encounter (Signed)
Refill advair

## 2013-11-03 ENCOUNTER — Encounter: Payer: Self-pay | Admitting: Physician Assistant

## 2013-11-03 ENCOUNTER — Ambulatory Visit (INDEPENDENT_AMBULATORY_CARE_PROVIDER_SITE_OTHER): Payer: 59 | Admitting: Physician Assistant

## 2013-11-03 VITALS — BP 112/78 | HR 89 | Temp 98.5°F | Resp 18 | Ht 67.0 in | Wt 225.8 lb

## 2013-11-03 DIAGNOSIS — J029 Acute pharyngitis, unspecified: Secondary | ICD-10-CM

## 2013-11-03 DIAGNOSIS — Z2089 Contact with and (suspected) exposure to other communicable diseases: Secondary | ICD-10-CM

## 2013-11-03 DIAGNOSIS — Z20818 Contact with and (suspected) exposure to other bacterial communicable diseases: Secondary | ICD-10-CM

## 2013-11-03 DIAGNOSIS — J329 Chronic sinusitis, unspecified: Secondary | ICD-10-CM

## 2013-11-03 LAB — POCT RAPID STREP A (OFFICE): Rapid Strep A Screen: NEGATIVE

## 2013-11-03 MED ORDER — AZITHROMYCIN 250 MG PO TABS: ORAL_TABLET | ORAL | Status: DC

## 2013-11-03 NOTE — Patient Instructions (Signed)
Increase fluid intake.  Rest.  Use saline nasal spray.  Continue Flonase and zyrtec daily.  Tylenol and salt-water gargles for sore throat.  Place a humidifier in the bedroom.  IF symptoms not improving over weekend, please start Azithromycin and take as directed.  Sinusitis Sinusitis is redness, soreness, and swelling (inflammation) of the paranasal sinuses. Paranasal sinuses are air pockets within the bones of your face (beneath the eyes, the middle of the forehead, or above the eyes). In healthy paranasal sinuses, mucus is able to drain out, and air is able to circulate through them by way of your nose. However, when your paranasal sinuses are inflamed, mucus and air can become trapped. This can allow bacteria and other germs to grow and cause infection. Sinusitis can develop quickly and last only a short time (acute) or continue over a long period (chronic). Sinusitis that lasts for more than 12 weeks is considered chronic.  CAUSES  Causes of sinusitis include:  Allergies.  Structural abnormalities, such as displacement of the cartilage that separates your nostrils (deviated septum), which can decrease the air flow through your nose and sinuses and affect sinus drainage.  Functional abnormalities, such as when the small hairs (cilia) that line your sinuses and help remove mucus do not work properly or are not present. SYMPTOMS  Symptoms of acute and chronic sinusitis are the same. The primary symptoms are pain and pressure around the affected sinuses. Other symptoms include:  Upper toothache.  Earache.  Headache.  Bad breath.  Decreased sense of smell and taste.  A cough, which worsens when you are lying flat.  Fatigue.  Fever.  Thick drainage from your nose, which often is green and may contain pus (purulent).  Swelling and warmth over the affected sinuses. DIAGNOSIS  Your caregiver will perform a physical exam. During the exam, your caregiver may:  Look in your nose for  signs of abnormal growths in your nostrils (nasal polyps).  Tap over the affected sinus to check for signs of infection.  View the inside of your sinuses (endoscopy) with a special imaging device with a light attached (endoscope), which is inserted into your sinuses. If your caregiver suspects that you have chronic sinusitis, one or more of the following tests may be recommended:  Allergy tests.  Nasal culture A sample of mucus is taken from your nose and sent to a lab and screened for bacteria.  Nasal cytology A sample of mucus is taken from your nose and examined by your caregiver to determine if your sinusitis is related to an allergy. TREATMENT  Most cases of acute sinusitis are related to a viral infection and will resolve on their own within 10 days. Sometimes medicines are prescribed to help relieve symptoms (pain medicine, decongestants, nasal steroid sprays, or saline sprays).  However, for sinusitis related to a bacterial infection, your caregiver will prescribe antibiotic medicines. These are medicines that will help kill the bacteria causing the infection.  Rarely, sinusitis is caused by a fungal infection. In theses cases, your caregiver will prescribe antifungal medicine. For some cases of chronic sinusitis, surgery is needed. Generally, these are cases in which sinusitis recurs more than 3 times per year, despite other treatments. HOME CARE INSTRUCTIONS   Drink plenty of water. Water helps thin the mucus so your sinuses can drain more easily.  Use a humidifier.  Inhale steam 3 to 4 times a day (for example, sit in the bathroom with the shower running).  Apply a warm, moist washcloth to  your face 3 to 4 times a day, or as directed by your caregiver.  Use saline nasal sprays to help moisten and clean your sinuses.  Take over-the-counter or prescription medicines for pain, discomfort, or fever only as directed by your caregiver. SEEK IMMEDIATE MEDICAL CARE IF:  You have  increasing pain or severe headaches.  You have nausea, vomiting, or drowsiness.  You have swelling around your face.  You have vision problems.  You have a stiff neck.  You have difficulty breathing. MAKE SURE YOU:   Understand these instructions.  Will watch your condition.  Will get help right away if you are not doing well or get worse. Document Released: 07/13/2005 Document Revised: 10/05/2011 Document Reviewed: 07/28/2011 [facility] Patient Information 2014 [place], [place].

## 2013-11-03 NOTE — Assessment & Plan Note (Signed)
Patient afebrile.  Rapid strep negative.  However patient with acute sinusitis and given Azithromycin for sinus.  Increase fluids. Tylenol and salt-water gargles for sore throat.  Placehumidifier in bedroom.

## 2013-11-03 NOTE — Progress Notes (Signed)
Patient presents to clinic today c/o headache, fever and sore throat.  Patient states that her fever is as high as 101 at night.  Has taken tylenol to help with pain and fever.  Patient also endorses signigicant sinus pressure, sinus pain and tooth pain. Patient denies cough, wheezing or shortness of breath.  Patient states both of her children are currently being treated for strep throat.  Past Medical History  Diagnosis Date  . GERD (gastroesophageal reflux disease)   . Obese   . Chronic sinusitis 02/2012    current runny nose of clear drainage  . Iron deficiency anemia   . Dental crowns present   . Acute bronchitis with asthma 06/24/2012  . Acute bronchitis 06/24/2012  . Allergic state 10/15/2013  . Back pain 10/15/2013    Current Outpatient Prescriptions on File Prior to Visit  Medication Sig Dispense Refill  . albuterol (PROVENTIL HFA;VENTOLIN HFA) 108 (90 BASE) MCG/ACT inhaler Inhale 2 puffs into the lungs every 6 (six) hours as needed for wheezing.  1 Inhaler  0  . b complex vitamins tablet Take 1 tablet by mouth daily.      Marland Kitchen esomeprazole (NEXIUM) 40 MG capsule TAKE 1 CAPSULE BY MOUTH DAILY BEFORE BREAKFAST.  90 capsule  0  . ferrous fumarate (HEMOCYTE - 106 MG FE) 325 (106 FE) MG TABS Take 1 tablet (106 mg of iron total) by mouth daily.  90 each  1  . fluticasone (FLONASE) 50 MCG/ACT nasal spray Place 2 sprays into the nose daily.  16 g  6  . Fluticasone-Salmeterol (ADVAIR DISKUS) 250-50 MCG/DOSE AEPB Inhale 1 puff into the lungs 2 (two) times daily.  60 each  1  . Multiple Vitamin (MULTIVITAMIN) tablet Take 1 tablet by mouth daily.        . Probiotic Product (MISC INTESTINAL FLORA REGULAT) CHEW as needed. Digestive Advantage probiotics by Schiff      . scopolamine (TRANSDERM-SCOP) 1 MG/3DAYS Place 1 patch (1.5 mg total) onto the skin every 3 (three) days.  2 patch  0  . sodium chloride (OCEAN) 0.65 % SOLN nasal spray Place 1 spray into the nose as needed for congestion.  1 Bottle  2    No current facility-administered medications on file prior to visit.    Allergies  Allergen Reactions  . Penicillins Rash  . Sulfa Antibiotics Rash    Family History  Problem Relation Age of Onset  . Diabetes Mother     type 2  . Hypertension Mother   . Depression Mother   . Mental illness Mother     bi-polar  . Pulmonary embolism Mother   . Heart disease Maternal Grandfather   . Stroke Maternal Grandfather   . Other Father     drug addiction  . Depression Maternal Aunt 46    suicide attempt with pneumonia    History   Social History  . Marital Status: Married    Spouse Name: N/A    Number of Children: N/A  . Years of Education: N/A   Social History Main Topics  . Smoking status: Former Smoker -- 1.00 packs/day for 19 years    Quit date: 07/28/2003  . Smokeless tobacco: Never Used  . Alcohol Use: Yes     Comment: occasionally  . Drug Use: No  . Sexual Activity: Yes    Partners: Male     Comment: lives with husband, stepson 65, son and daughter. no dietary restrictions   Other Topics Concern  . None  Social History Narrative  . None   Review of Systems - See HPI.  All other ROS are negative.  BP 112/78  Pulse 89  Temp(Src) 98.5 F (36.9 C) (Oral)  Resp 18  Ht 5\' 7"  (1.702 m)  Wt 225 lb 12 oz (102.4 kg)  BMI 35.35 kg/m2  SpO2 98%  LMP 10/13/2013  Physical Exam  Vitals reviewed. Constitutional: She is oriented to person, place, and time and well-developed, well-nourished, and in no distress.  HENT:  Head: Normocephalic and atraumatic.  Right Ear: Tympanic membrane, external ear and ear canal normal.  Left Ear: Tympanic membrane, external ear and ear canal normal.  Mouth/Throat: Uvula is midline and mucous membranes are normal. Posterior oropharyngeal erythema present. No oropharyngeal exudate, posterior oropharyngeal edema or tonsillar abscesses.  Eyes: Conjunctivae are normal. Pupils are equal, round, and reactive to light.  Neck: Neck  supple.  Cardiovascular: Normal rate, regular rhythm, normal heart sounds and intact distal pulses.   Pulmonary/Chest: Effort normal and breath sounds normal. No respiratory distress. She has no wheezes. She has no rales. She exhibits no tenderness.  Lymphadenopathy:       Head (right side): No submental, no submandibular, no tonsillar, no preauricular, no posterior auricular and no occipital adenopathy present.       Head (left side): No submental, no submandibular, no tonsillar, no preauricular, no posterior auricular and no occipital adenopathy present.    She has no cervical adenopathy.  Neurological: She is alert and oriented to person, place, and time.  Skin: Skin is warm and dry. No rash noted.  Psychiatric: Affect normal.    Recent Results (from the past 2160 hour(s))  LIPID PANEL     Status: None   Collection Time    10/12/13  2:40 PM      Result Value Ref Range   Cholesterol 170  0 - 200 mg/dL   Comment: ATP III Classification:           < 200        mg/dL        Desirable          200 - 239     mg/dL        Borderline High          >= 240        mg/dL        High         Triglycerides 98  <150 mg/dL   HDL 54  >39 mg/dL   Total CHOL/HDL Ratio 3.1     VLDL 20  0 - 40 mg/dL   LDL Cholesterol 96  0 - 99 mg/dL   Comment:       Total Cholesterol/HDL Ratio:CHD Risk                            Coronary Heart Disease Risk Table                                            Men       Women              1/2 Average Risk              3.4        3.3  Average Risk              5.0        4.4               2X Average Risk              9.6        7.1               3X Average Risk             23.4       11.0     Use the calculated Patient Ratio above and the CHD Risk table      to determine the patient's CHD Risk.     ATP III Classification (LDL):           < 100        mg/dL         Optimal          100 - 129     mg/dL         Near or Above Optimal          130 - 159      mg/dL         Borderline High          160 - 189     mg/dL         High           > 190        mg/dL         Very High        TSH     Status: None   Collection Time    10/12/13  2:40 PM      Result Value Ref Range   TSH 2.906  0.350 - 4.500 uIU/mL  RENAL FUNCTION PANEL     Status: None   Collection Time    10/12/13  2:40 PM      Result Value Ref Range   Sodium 140  135 - 145 mEq/L   Potassium 4.1  3.5 - 5.3 mEq/L   Chloride 102  96 - 112 mEq/L   CO2 29  19 - 32 mEq/L   Glucose, Bld 80  70 - 99 mg/dL   BUN 9  6 - 23 mg/dL   Creat 0.88  0.50 - 1.10 mg/dL   Albumin 4.2  3.5 - 5.2 g/dL   Calcium 9.0  8.4 - 10.5 mg/dL   Phosphorus 3.1  2.3 - 4.6 mg/dL  HEPATIC FUNCTION PANEL     Status: None   Collection Time    10/12/13  2:40 PM      Result Value Ref Range   Total Bilirubin 0.4  0.2 - 1.2 mg/dL   Bilirubin, Direct 0.1  0.0 - 0.3 mg/dL   Indirect Bilirubin 0.3  0.2 - 1.2 mg/dL   Alkaline Phosphatase 50  39 - 117 U/L   AST 17  0 - 37 U/L   ALT 18  0 - 35 U/L   Total Protein 7.6  6.0 - 8.3 g/dL   Albumin 4.2  3.5 - 5.2 g/dL  CBC     Status: Abnormal   Collection Time    10/12/13  2:40 PM      Result Value Ref Range   WBC 7.0  4.0 - 10.5 K/uL   RBC 4.61  3.87 - 5.11 MIL/uL   Hemoglobin 12.6  12.0 -  15.0 g/dL   HCT 38.3  36.0 - 46.0 %   MCV 83.1  78.0 - 100.0 fL   MCH 27.3  26.0 - 34.0 pg   MCHC 32.9  30.0 - 36.0 g/dL   RDW 15.7 (*) 11.5 - 15.5 %   Platelets 254  150 - 400 K/uL  POCT RAPID STREP A (OFFICE)     Status: None   Collection Time    11/03/13  1:40 PM      Result Value Ref Range   Rapid Strep A Screen Negative  Negative    Assessment/Plan: Sinusitis Discuss possible viral etiology of symptoms.  However, patient with significant tooth pain and + TTP.  Rx for Azithromycin printed and given to take if symptoms not improving over the weekend.  For symptoms -- Increase fluid intake.  Rest.  Use saline nasal spray.  Take a daily claritin and Flonase.  Tylenol for  throat discomfort.  Humidifier in bedroom.  Call or return to clinic if symptoms are not improving.  Sore throat Patient afebrile.  Rapid strep negative.  However patient with acute sinusitis and given Azithromycin for sinus.  Increase fluids. Tylenol and salt-water gargles for sore throat.  Placehumidifier in bedroom.

## 2013-11-03 NOTE — Assessment & Plan Note (Signed)
Discuss possible viral etiology of symptoms.  However, patient with significant tooth pain and + TTP.  Rx for Azithromycin printed and given to take if symptoms not improving over the weekend.  For symptoms -- Increase fluid intake.  Rest.  Use saline nasal spray.  Take a daily claritin and Flonase.  Tylenol for throat discomfort.  Humidifier in bedroom.  Call or return to clinic if symptoms are not improving.

## 2014-04-17 ENCOUNTER — Encounter: Payer: Self-pay | Admitting: Family Medicine

## 2014-04-17 ENCOUNTER — Ambulatory Visit (INDEPENDENT_AMBULATORY_CARE_PROVIDER_SITE_OTHER): Payer: 59 | Admitting: Family Medicine

## 2014-04-17 VITALS — BP 135/82 | HR 65 | Temp 98.6°F | Ht 67.0 in | Wt 234.6 lb

## 2014-04-17 DIAGNOSIS — Z23 Encounter for immunization: Secondary | ICD-10-CM

## 2014-04-17 DIAGNOSIS — D509 Iron deficiency anemia, unspecified: Secondary | ICD-10-CM

## 2014-04-17 DIAGNOSIS — G43909 Migraine, unspecified, not intractable, without status migrainosus: Secondary | ICD-10-CM

## 2014-04-17 DIAGNOSIS — K219 Gastro-esophageal reflux disease without esophagitis: Secondary | ICD-10-CM

## 2014-04-17 DIAGNOSIS — E669 Obesity, unspecified: Secondary | ICD-10-CM

## 2014-04-17 MED ORDER — SUMATRIPTAN SUCCINATE 50 MG PO TABS: 50.0000 mg | ORAL_TABLET | ORAL | Status: DC | PRN

## 2014-04-17 NOTE — Patient Instructions (Signed)
Encouraged increased hydration, 64 ounces of clear fluids daily. Minimize alcohol and caffeine. Eat small frequent meals with lean proteins and complex carbs. Avoid high and low blood sugars. Get adequate sleep, 7-8 hours a night. Needs exercise daily preferably in the morning.  Take 2 Advil/Motrin/Ibuprofen as quick as possible if no improvement then try the Imitrex   Migraine Headache A migraine headache is an intense, throbbing pain on one or both sides of your head. A migraine can last for 30 minutes to several hours. CAUSES  The exact cause of a migraine headache is not always known. However, a migraine may be caused when nerves in the brain become irritated and release chemicals that cause inflammation. This causes pain. Certain things may also trigger migraines, such as:  Alcohol.  Smoking.  Stress.  Menstruation.  Aged cheeses.  Foods or drinks that contain nitrates, glutamate, aspartame, or tyramine.  Lack of sleep.  Chocolate.  Caffeine.  Hunger.  Physical exertion.  Fatigue.  Medicines used to treat chest pain (nitroglycerine), birth control pills, estrogen, and some blood pressure medicines. SIGNS AND SYMPTOMS  Pain on one or both sides of your head.  Pulsating or throbbing pain.  Severe pain that prevents daily activities.  Pain that is aggravated by any physical activity.  Nausea, vomiting, or both.  Dizziness.  Pain with exposure to bright lights, loud noises, or activity.  General sensitivity to bright lights, loud noises, or smells. Before you get a migraine, you may get warning signs that a migraine is coming (aura). An aura may include:  Seeing flashing lights.  Seeing bright spots, halos, or zigzag lines.  Having tunnel vision or blurred vision.  Having feelings of numbness or tingling.  Having trouble talking.  Having muscle weakness. DIAGNOSIS  A migraine headache is often diagnosed based on:  Symptoms.  Physical exam.  A  CT scan or MRI of your head. These imaging tests cannot diagnose migraines, but they can help rule out other causes of headaches. TREATMENT Medicines may be given for pain and nausea. Medicines can also be given to help prevent recurrent migraines.  HOME CARE INSTRUCTIONS  Only take over-the-counter or prescription medicines for pain or discomfort as directed by your health care provider. The use of long-term narcotics is not recommended.  Lie down in a dark, quiet room when you have a migraine.  Keep a journal to find out what may trigger your migraine headaches. For example, write down:  What you eat and drink.  How much sleep you get.  Any change to your diet or medicines.  Limit alcohol consumption.  Quit smoking if you smoke.  Get 7-9 hours of sleep, or as recommended by your health care provider.  Limit stress.  Keep lights dim if bright lights bother you and make your migraines worse. SEEK IMMEDIATE MEDICAL CARE IF:   Your migraine becomes severe.  You have a fever.  You have a stiff neck.  You have vision loss.  You have muscular weakness or loss of muscle control.  You start losing your balance or have trouble walking.  You feel faint or pass out.  You have severe symptoms that are different from your first symptoms. MAKE SURE YOU:   Understand these instructions.  Will watch your condition.  Will get help right away if you are not doing well or get worse. Document Released: 07/13/2005 Document Revised: 11/27/2013 Document Reviewed: 03/20/2013 [facility] Patient Information 2015 [place], [place]. This information is not intended to replace advice given  to you by your health care provider. Make sure you discuss any questions you have with your health care provider.  

## 2014-04-17 NOTE — Progress Notes (Signed)
Pre visit review using our clinic review tool, if applicable. No additional management support is needed unless otherwise documented below in the visit note. 

## 2014-04-18 LAB — CBC
HEMATOCRIT: 38 % (ref 36.0–46.0)
HEMOGLOBIN: 12.9 g/dL (ref 12.0–15.0)
MCHC: 34 g/dL (ref 30.0–36.0)
MCV: 91.5 fl (ref 78.0–100.0)
PLATELETS: 209 10*3/uL (ref 150.0–400.0)
RBC: 4.15 Mil/uL (ref 3.87–5.11)
RDW: 13.9 % (ref 11.5–15.5)
WBC: 7.2 10*3/uL (ref 4.0–10.5)

## 2014-04-18 LAB — TSH: TSH: 3.55 u[IU]/mL (ref 0.35–4.50)

## 2014-04-22 ENCOUNTER — Encounter: Payer: Self-pay | Admitting: Family Medicine

## 2014-04-22 NOTE — Assessment & Plan Note (Signed)
Encouraged DASH diet, decrease po intake and increase exercise as tolerated. Needs 7-8 hours of sleep nightly. Avoid trans fats, eat small, frequent meals every 4-5 hours with lean proteins, complex carbs and healthy fats. Minimize simple carbs, GMO foods. 

## 2014-04-22 NOTE — Progress Notes (Signed)
Patient ID: Connie Blanchard, female   DOB: 1971/01/19, 43 y.o.   MRN: [id number] JAMIYAH DINGLEY [id number] 11-Sep-1970 04/22/2014      Progress Note-Follow Up  Subjective  Chief Complaint  No chief complaint on file.   HPI  Patient is a 43 year old female in today for routine medical care. Doing well. To the recent bad headache with some nausea and photophobia but this is uncommon. Does not have recurrent headaches. Does agree to flu shot today. No recent illness. Denies CP/palp/SOB/HA/congestion/fevers/GI or GU c/o. Taking meds as prescribed  Past Medical History  Diagnosis Date  . GERD (gastroesophageal reflux disease)   . Obese   . Chronic sinusitis 02/2012    current runny nose of clear drainage  . Iron deficiency anemia   . Dental crowns present   . Acute bronchitis with asthma 06/24/2012  . Acute bronchitis 06/24/2012  . Allergic state 10/15/2013  . Back pain 10/15/2013    Past Surgical History  Procedure Laterality Date  . Dilation and curettage of uterus  1987  . Nasal septoplasty w/ turbinoplasty  03/23/2012    Procedure: NASAL SEPTOPLASTY WITH TURBINATE REDUCTION;  Surgeon: Jerrell Belfast, MD;  Location: [place] [facility];  Service: ENT;  Laterality: Bilateral;  . Sinus endo w/fusion  03/23/2012    Procedure: ENDOSCOPIC SINUS SURGERY WITH FUSION NAVIGATION;  Surgeon: Jerrell Belfast, MD;  Location: [facility];  Service: ENT;  Laterality: Bilateral;  with fusion scan    Family History  Problem Relation Age of Onset  . Diabetes Mother     type 2  . Hypertension Mother   . Depression Mother   . Mental illness Mother     bi-polar  . Pulmonary embolism Mother   . Heart disease Maternal Grandfather   . Stroke Maternal Grandfather   . Other Father     drug addiction  . Depression Maternal Aunt 46    suicide attempt with pneumonia    History   Social History  . Marital Status: Married    Spouse Name: N/A    Number of Children: N/A  .  Years of Education: N/A   Occupational History  . Not on file.   Social History Main Topics  . Smoking status: Former Smoker -- 1.00 packs/day for 19 years    Quit date: 07/28/2003  . Smokeless tobacco: Never Used  . Alcohol Use: Yes     Comment: occasionally  . Drug Use: No  . Sexual Activity: Yes    Partners: Male     Comment: lives with husband, stepson 75, son and daughter. no dietary restrictions   Other Topics Concern  . Not on file   Social History Narrative  . No narrative on file    Current Outpatient Prescriptions on File Prior to Visit  Medication Sig Dispense Refill  . albuterol (PROVENTIL HFA;VENTOLIN HFA) 108 (90 BASE) MCG/ACT inhaler Inhale 2 puffs into the lungs every 6 (six) hours as needed for wheezing.  1 Inhaler  0  . b complex vitamins tablet Take 1 tablet by mouth daily.      Marland Kitchen esomeprazole (NEXIUM) 40 MG capsule TAKE 1 CAPSULE BY MOUTH DAILY BEFORE BREAKFAST.  90 capsule  0  . ferrous fumarate (HEMOCYTE - 106 MG FE) 325 (106 FE) MG TABS Take 1 tablet (106 mg of iron total) by mouth daily.  90 each  1  . fluticasone (FLONASE) 50 MCG/ACT nasal spray Place 2 sprays into the nose daily.  16 g  6  . Multiple Vitamin (MULTIVITAMIN) tablet Take 1 tablet by mouth daily.        . Probiotic Product (MISC INTESTINAL FLORA REGULAT) CHEW as needed. Digestive Advantage probiotics by Schiff      . sodium chloride (OCEAN) 0.65 % SOLN nasal spray Place 1 spray into the nose as needed for congestion.  1 Bottle  2   No current facility-administered medications on file prior to visit.    Allergies  Allergen Reactions  . Penicillins Rash  . Sulfa Antibiotics Rash    Review of Systems  Review of Systems  Constitutional: Negative for fever and malaise/fatigue.  HENT: Negative for congestion.   Eyes: Negative for discharge.  Respiratory: Negative for shortness of breath.   Cardiovascular: Negative for chest pain, palpitations and leg swelling.  Gastrointestinal:  Negative for nausea, abdominal pain and diarrhea.  Genitourinary: Negative for dysuria.  Musculoskeletal: Negative for falls.  Skin: Negative for rash.  Neurological: Negative for loss of consciousness and headaches.  Endo/Heme/Allergies: Negative for polydipsia.  Psychiatric/Behavioral: Negative for depression and suicidal ideas. The patient is not nervous/anxious and does not have insomnia.     Objective  BP 135/82  Pulse 65  Temp(Src) 98.6 F (37 C) (Oral)  Ht 5\' 7"  (1.702 m)  Wt 234 lb 9.6 oz (106.414 kg)  BMI 36.73 kg/m2  SpO2 99%  LMP 03/15/2014  Physical Exam  Physical Exam  Constitutional: She is oriented to person, place, and time and well-developed, well-nourished, and in no distress. No distress.  HENT:  Head: Normocephalic and atraumatic.  Eyes: Conjunctivae are normal.  Neck: Neck supple. No thyromegaly present.  Cardiovascular: Normal rate, regular rhythm and normal heart sounds.   No murmur heard. Pulmonary/Chest: Effort normal and breath sounds normal. She has no wheezes.  Abdominal: She exhibits no distension and no mass.  Musculoskeletal: She exhibits no edema.  Lymphadenopathy:    She has no cervical adenopathy.  Neurological: She is alert and oriented to person, place, and time.  Skin: Skin is warm and dry. No rash noted. She is not diaphoretic.  Psychiatric: Memory, affect and judgment normal.    Lab Results  Component Value Date   TSH 3.55 04/17/2014   Lab Results  Component Value Date   WBC 7.2 04/17/2014   HGB 12.9 04/17/2014   HCT 38.0 04/17/2014   MCV 91.5 04/17/2014   PLT 209.0 04/17/2014   Lab Results  Component Value Date   CREATININE 0.88 10/12/2013   BUN 9 10/12/2013   NA 140 10/12/2013   K 4.1 10/12/2013   CL 102 10/12/2013   CO2 29 10/12/2013   Lab Results  Component Value Date   ALT 18 10/12/2013   AST 17 10/12/2013   ALKPHOS 50 10/12/2013   BILITOT 0.4 10/12/2013   Lab Results  Component Value Date   CHOL 170 10/12/2013   Lab  Results  Component Value Date   HDL 54 10/12/2013   Lab Results  Component Value Date   LDLCALC 96 10/12/2013   Lab Results  Component Value Date   TRIG 98 10/12/2013   Lab Results  Component Value Date   CHOLHDL 3.1 10/12/2013     Assessment & Plan  Obese Encouraged DASH diet, decrease po intake and increase exercise as tolerated. Needs 7-8 hours of sleep nightly. Avoid trans fats, eat small, frequent meals every 4-5 hours with lean proteins, complex carbs and healthy fats. Minimize simple carbs, GMO foods.  GERD (gastroesophageal reflux disease)  Avoid offending foods, start probiotics. Do not eat large meals in late evening and consider raising head of bed.   Anemia resolved

## 2014-04-22 NOTE — Assessment & Plan Note (Signed)
resolved 

## 2014-04-22 NOTE — Assessment & Plan Note (Signed)
Avoid offending foods, start probiotics. Do not eat large meals in late evening and consider raising head of bed.  

## 2014-06-27 ENCOUNTER — Telehealth: Payer: Self-pay | Admitting: Family Medicine

## 2014-06-27 NOTE — Telephone Encounter (Signed)
She is your patient and would like her children to become your patient as well she has a 45 year old girl and a 46 year old boy.  They are well.  The son needs to be first.  Please advise a time I could use

## 2014-06-28 NOTE — Telephone Encounter (Signed)
I am happy to take them on. I will be coming in early the 3rd Tuesday. Presently it is still blocked I believe but I woud like to come in early so could see them then?

## 2014-10-30 ENCOUNTER — Other Ambulatory Visit: Payer: Self-pay | Admitting: Obstetrics and Gynecology

## 2014-10-31 LAB — CYTOLOGY - PAP

## 2014-11-02 ENCOUNTER — Other Ambulatory Visit: Payer: Self-pay | Admitting: Obstetrics and Gynecology

## 2014-11-02 DIAGNOSIS — R928 Other abnormal and inconclusive findings on diagnostic imaging of breast: Secondary | ICD-10-CM

## 2014-11-08 ENCOUNTER — Ambulatory Visit
Admission: RE | Admit: 2014-11-08 | Discharge: 2014-11-08 | Disposition: A | Discharge status: Home / Self Care | Payer: 59 | Source: Ambulatory Visit | Attending: Obstetrics and Gynecology | Admitting: Obstetrics and Gynecology

## 2014-11-08 DIAGNOSIS — R928 Other abnormal and inconclusive findings on diagnostic imaging of breast: Secondary | ICD-10-CM

## 2015-01-29 ENCOUNTER — Telehealth: Payer: Self-pay | Admitting: Family Medicine

## 2015-01-29 NOTE — Telephone Encounter (Signed)
pre visit letter mailed 01/22/15

## 2015-02-11 ENCOUNTER — Telehealth: Payer: Self-pay | Admitting: *Deleted

## 2015-02-11 NOTE — Telephone Encounter (Signed)
Unable to reach patient at time of Pre-Visit Call.  Left message for patient to return call when available.    

## 2015-02-12 ENCOUNTER — Encounter: Payer: Self-pay | Admitting: Family Medicine

## 2015-02-12 ENCOUNTER — Ambulatory Visit (INDEPENDENT_AMBULATORY_CARE_PROVIDER_SITE_OTHER): Payer: 59 | Admitting: Family Medicine

## 2015-02-12 VITALS — BP 122/86 | HR 83 | Temp 98.1°F | Ht 67.0 in | Wt 222.0 lb

## 2015-02-12 DIAGNOSIS — Z0000 Encounter for general adult medical examination without abnormal findings: Secondary | ICD-10-CM | POA: Diagnosis not present

## 2015-02-12 DIAGNOSIS — E785 Hyperlipidemia, unspecified: Secondary | ICD-10-CM

## 2015-02-12 DIAGNOSIS — E669 Obesity, unspecified: Secondary | ICD-10-CM

## 2015-02-12 DIAGNOSIS — E782 Mixed hyperlipidemia: Secondary | ICD-10-CM | POA: Diagnosis not present

## 2015-02-12 DIAGNOSIS — K219 Gastro-esophageal reflux disease without esophagitis: Secondary | ICD-10-CM

## 2015-02-12 NOTE — Patient Instructions (Signed)
Encouraged good sleep hygiene such as dark, quiet room. No blue/green glowing lights such as computer screens in bedroom. No alcohol or stimulants in evening. Cut down on caffeine as able. Regular exercise is helpful but not just prior to bed time.   Preventive Care for Adults A healthy lifestyle and preventive care can promote health and wellness. Preventive health guidelines for women include the following key practices.  A routine yearly physical is a good way to check with your health care provider about your health and preventive screening. It is a chance to share any concerns and updates on your health and to receive a thorough exam.  Visit your dentist for a routine exam and preventive care every 6 months. Brush your teeth twice a day and floss once a day. Good oral hygiene prevents tooth decay and gum disease.  The frequency of eye exams is based on your age, health, family medical history, use of contact lenses, and other factors. Follow your health care provider's recommendations for frequency of eye exams.  Eat a healthy diet. Foods like vegetables, fruits, whole grains, low-fat dairy products, and lean protein foods contain the nutrients you need without too many calories. Decrease your intake of foods high in solid fats, added sugars, and salt. Eat the right amount of calories for you.Get information about a proper diet from your health care provider, if necessary.  Regular physical exercise is one of the most important things you can do for your health. Most adults should get at least 150 minutes of moderate-intensity exercise (any activity that increases your heart rate and causes you to sweat) each week. In addition, most adults need muscle-strengthening exercises on 2 or more days a week.  Maintain a healthy weight. The body mass index (BMI) is a screening tool to identify possible weight problems. It provides an estimate of body fat based on height and weight. Your health care  provider can find your BMI and can help you achieve or maintain a healthy weight.For adults 20 years and older:  A BMI below 18.5 is considered underweight.  A BMI of 18.5 to 24.9 is normal.  A BMI of 25 to 29.9 is considered overweight.  A BMI of 30 and above is considered obese.  Maintain normal blood lipids and cholesterol levels by exercising and minimizing your intake of saturated fat. Eat a balanced diet with plenty of fruit and vegetables. Blood tests for lipids and cholesterol should begin at age 30 and be repeated every 5 years. If your lipid or cholesterol levels are high, you are over 50, or you are at high risk for heart disease, you may need your cholesterol levels checked more frequently.Ongoing high lipid and cholesterol levels should be treated with medicines if diet and exercise are not working.  If you smoke, find out from your health care provider how to quit. If you do not use tobacco, do not start.  Lung cancer screening is recommended for adults aged 75-80 years who are at high risk for developing lung cancer because of a history of smoking. A yearly low-dose CT scan of the lungs is recommended for people who have at least a 30-pack-year history of smoking and are a current smoker or have quit within the past 15 years. A pack year of smoking is smoking an average of 1 pack of cigarettes a day for 1 year (for example: 1 pack a day for 30 years or 2 packs a day for 15 years). Yearly screening should continue until  the smoker has stopped smoking for at least 15 years. Yearly screening should be stopped for people who develop a health problem that would prevent them from having lung cancer treatment.  If you are pregnant, do not drink alcohol. If you are breastfeeding, be very cautious about drinking alcohol. If you are not pregnant and choose to drink alcohol, do not have more than 1 drink per day. One drink is considered to be 12 ounces (355 mL) of beer, 5 ounces (148 mL) of  wine, or 1.5 ounces (44 mL) of liquor.  Avoid use of street drugs. Do not share needles with anyone. Ask for help if you need support or instructions about stopping the use of drugs.  High blood pressure causes heart disease and increases the risk of stroke. Your blood pressure should be checked at least every 1 to 2 years. Ongoing high blood pressure should be treated with medicines if weight loss and exercise do not work.  If you are 78-79 years old, ask your health care provider if you should take aspirin to prevent strokes.  Diabetes screening involves taking a blood sample to check your fasting blood sugar level. This should be done once every 3 years, after age 24, if you are within normal weight and without risk factors for diabetes. Testing should be considered at a younger age or be carried out more frequently if you are overweight and have at least 1 risk factor for diabetes.  Breast cancer screening is essential preventive care for women. You should practice "breast self-awareness." This means understanding the normal appearance and feel of your breasts and may include breast self-examination. Any changes detected, no matter how small, should be reported to a health care provider. Women in their 70s and 30s should have a clinical breast exam (CBE) by a health care provider as part of a regular health exam every 1 to 3 years. After age 57, women should have a CBE every year. Starting at age 41, women should consider having a mammogram (breast X-ray test) every year. Women who have a family history of breast cancer should talk to their health care provider about genetic screening. Women at a high risk of breast cancer should talk to their health care providers about having an MRI and a mammogram every year.  Breast cancer gene (BRCA)-related cancer risk assessment is recommended for women who have family members with BRCA-related cancers. BRCA-related cancers include breast, ovarian, tubal, and  peritoneal cancers. Having family members with these cancers may be associated with an increased risk for harmful changes (mutations) in the breast cancer genes BRCA1 and BRCA2. Results of the assessment will determine the need for genetic counseling and BRCA1 and BRCA2 testing.  Routine pelvic exams to screen for cancer are no longer recommended for nonpregnant women who are considered low risk for cancer of the pelvic organs (ovaries, uterus, and vagina) and who do not have symptoms. Ask your health care provider if a screening pelvic exam is right for you.  If you have had past treatment for cervical cancer or a condition that could lead to cancer, you need Pap tests and screening for cancer for at least 20 years after your treatment. If Pap tests have been discontinued, your risk factors (such as having a new sexual partner) need to be reassessed to determine if screening should be resumed. Some women have medical problems that increase the chance of getting cervical cancer. In these cases, your health care provider may recommend more frequent screening  and Pap tests.  The HPV test is an additional test that may be used for cervical cancer screening. The HPV test looks for the virus that can cause the cell changes on the cervix. The cells collected during the Pap test can be tested for HPV. The HPV test could be used to screen women aged 34 years and older, and should be used in women of any age who have unclear Pap test results. After the age of 36, women should have HPV testing at the same frequency as a Pap test.  Colorectal cancer can be detected and often prevented. Most routine colorectal cancer screening begins at the age of 32 years and continues through age 73 years. However, your health care provider may recommend screening at an earlier age if you have risk factors for colon cancer. On a yearly basis, your health care provider may provide home test kits to check for hidden blood in the stool.  Use of a small camera at the end of a tube, to directly examine the colon (sigmoidoscopy or colonoscopy), can detect the earliest forms of colorectal cancer. Talk to your health care provider about this at age 28, when routine screening begins. Direct exam of the colon should be repeated every 5-10 years through age 39 years, unless early forms of pre-cancerous polyps or small growths are found.  People who are at an increased risk for hepatitis B should be screened for this virus. You are considered at high risk for hepatitis B if:  You were born in a country where hepatitis B occurs often. Talk with your health care provider about which countries are considered high risk.  Your parents were born in a high-risk country and you have not received a shot to protect against hepatitis B (hepatitis B vaccine).  You have HIV or AIDS.  You use needles to inject street drugs.  You live with, or have sex with, someone who has hepatitis B.  You get hemodialysis treatment.  You take certain medicines for conditions like cancer, organ transplantation, and autoimmune conditions.  Hepatitis C blood testing is recommended for all people born from [birth year] through 1967 and any individual with known risks for hepatitis C.  Practice safe sex. Use condoms and avoid high-risk sexual practices to reduce the spread of sexually transmitted infections (STIs). STIs include gonorrhea, chlamydia, syphilis, trichomonas, herpes, HPV, and human immunodeficiency virus (HIV). Herpes, HIV, and HPV are viral illnesses that have no cure. They can result in disability, cancer, and death.  You should be screened for sexually transmitted illnesses (STIs) including gonorrhea and chlamydia if:  You are sexually active and are younger than 24 years.  You are older than 24 years and your health care provider tells you that you are at risk for this type of infection.  Your sexual activity has changed since you were last screened and  you are at an increased risk for chlamydia or gonorrhea. Ask your health care provider if you are at risk.  If you are at risk of being infected with HIV, it is recommended that you take a prescription medicine daily to prevent HIV infection. This is called preexposure prophylaxis (PrEP). You are considered at risk if:  You are a heterosexual woman, are sexually active, and are at increased risk for HIV infection.  You take drugs by injection.  You are sexually active with a partner who has HIV.  Talk with your health care provider about whether you are at high risk of being infected  with HIV. If you choose to begin PrEP, you should first be tested for HIV. You should then be tested every 3 months for as long as you are taking PrEP.  Osteoporosis is a disease in which the bones lose minerals and strength with aging. This can result in serious bone fractures or breaks. The risk of osteoporosis can be identified using a bone density scan. Women ages 37 years and over and women at risk for fractures or osteoporosis should discuss screening with their health care providers. Ask your health care provider whether you should take a calcium supplement or vitamin D to reduce the rate of osteoporosis.  Menopause can be associated with physical symptoms and risks. Hormone replacement therapy is available to decrease symptoms and risks. You should talk to your health care provider about whether hormone replacement therapy is right for you.  Use sunscreen. Apply sunscreen liberally and repeatedly throughout the day. You should seek shade when your shadow is shorter than you. Protect yourself by wearing long sleeves, pants, a wide-brimmed hat, and sunglasses year round, whenever you are outdoors.  Once a month, do a whole body skin exam, using a mirror to look at the skin on your back. Tell your health care provider of new moles, moles that have irregular borders, moles that are larger than a pencil eraser, or  moles that have changed in shape or color.  Stay current with required vaccines (immunizations).  Influenza vaccine. All adults should be immunized every year.  Tetanus, diphtheria, and acellular pertussis (Td, Tdap) vaccine. Pregnant women should receive 1 dose of Tdap vaccine during each pregnancy. The dose should be obtained regardless of the length of time since the last dose. Immunization is preferred during the 27th-36th week of gestation. An adult who has not previously received Tdap or who does not know her vaccine status should receive 1 dose of Tdap. This initial dose should be followed by tetanus and diphtheria toxoids (Td) booster doses every 10 years. Adults with an unknown or incomplete history of completing a 3-dose immunization series with Td-containing vaccines should begin or complete a primary immunization series including a Tdap dose. Adults should receive a Td booster every 10 years.  Varicella vaccine. An adult without evidence of immunity to varicella should receive 2 doses or a second dose if she has previously received 1 dose. Pregnant females who do not have evidence of immunity should receive the first dose after pregnancy. This first dose should be obtained before leaving the health care facility. The second dose should be obtained 4-8 weeks after the first dose.  Human papillomavirus (HPV) vaccine. Females aged 13-26 years who have not received the vaccine previously should obtain the 3-dose series. The vaccine is not recommended for use in pregnant females. However, pregnancy testing is not needed before receiving a dose. If a female is found to be pregnant after receiving a dose, no treatment is needed. In that case, the remaining doses should be delayed until after the pregnancy. Immunization is recommended for any person with an immunocompromised condition through the age of 76 years if she did not get any or all doses earlier. During the 3-dose series, the second dose  should be obtained 4-8 weeks after the first dose. The third dose should be obtained 24 weeks after the first dose and 16 weeks after the second dose.  Zoster vaccine. One dose is recommended for adults aged 2 years or older unless certain conditions are present.  Measles, mumps, and rubella (MMR)  vaccine. Adults born before 45 generally are considered immune to measles and mumps. Adults born in [birth year] or later should have 1 or more doses of MMR vaccine unless there is a contraindication to the vaccine or there is laboratory evidence of immunity to each of the three diseases. A routine second dose of MMR vaccine should be obtained at least 28 days after the first dose for students attending postsecondary schools, health care workers, or international travelers. People who received inactivated measles vaccine or an unknown type of measles vaccine during 1963-1967 should receive 2 doses of MMR vaccine. People who received inactivated mumps vaccine or an unknown type of mumps vaccine before 1979 and are at high risk for mumps infection should consider immunization with 2 doses of MMR vaccine. For females of childbearing age, rubella immunity should be determined. If there is no evidence of immunity, females who are not pregnant should be vaccinated. If there is no evidence of immunity, females who are pregnant should delay immunization until after pregnancy. Unvaccinated health care workers born before [birth year] who lack laboratory evidence of measles, mumps, or rubella immunity or laboratory confirmation of disease should consider measles and mumps immunization with 2 doses of MMR vaccine or rubella immunization with 1 dose of MMR vaccine.  Pneumococcal 13-valent conjugate (PCV13) vaccine. When indicated, a person who is uncertain of her immunization history and has no record of immunization should receive the PCV13 vaccine. An adult aged 64 years or older who has certain medical conditions and has not been  previously immunized should receive 1 dose of PCV13 vaccine. This PCV13 should be followed with a dose of pneumococcal polysaccharide (PPSV23) vaccine. The PPSV23 vaccine dose should be obtained at least 8 weeks after the dose of PCV13 vaccine. An adult aged 61 years or older who has certain medical conditions and previously received 1 or more doses of PPSV23 vaccine should receive 1 dose of PCV13. The PCV13 vaccine dose should be obtained 1 or more years after the last PPSV23 vaccine dose.  Pneumococcal polysaccharide (PPSV23) vaccine. When PCV13 is also indicated, PCV13 should be obtained first. All adults aged 25 years and older should be immunized. An adult younger than age 51 years who has certain medical conditions should be immunized. Any person who resides in a nursing home or long-term care facility should be immunized. An adult smoker should be immunized. People with an immunocompromised condition and certain other conditions should receive both PCV13 and PPSV23 vaccines. People with human immunodeficiency virus (HIV) infection should be immunized as soon as possible after diagnosis. Immunization during chemotherapy or radiation therapy should be avoided. Routine use of PPSV23 vaccine is not recommended for American Indians, [place] Natives, or people younger than 65 years unless there are medical conditions that require PPSV23 vaccine. When indicated, people who have unknown immunization and have no record of immunization should receive PPSV23 vaccine. One-time revaccination 5 years after the first dose of PPSV23 is recommended for people aged 19-64 years who have chronic kidney failure, nephrotic syndrome, asplenia, or immunocompromised conditions. People who received 1-2 doses of PPSV23 before age 50 years should receive another dose of PPSV23 vaccine at age 48 years or later if at least 5 years have passed since the previous dose. Doses of PPSV23 are not needed for people immunized with PPSV23 at or  after age 65 years.  Meningococcal vaccine. Adults with asplenia or persistent complement component deficiencies should receive 2 doses of quadrivalent meningococcal conjugate (MenACWY-D) vaccine. The doses should be obtained  at least 2 months apart. Microbiologists working with certain meningococcal bacteria, [employer] recruits, people at risk during an outbreak, and people who travel to or live in countries with a high rate of meningitis should be immunized. A first-year college student up through age 28 years who is living in a residence hall should receive a dose if she did not receive a dose on or after her 16th birthday. Adults who have certain high-risk conditions should receive one or more doses of vaccine.  Hepatitis A vaccine. Adults who wish to be protected from this disease, have certain high-risk conditions, work with hepatitis A-infected animals, work in hepatitis A research labs, or travel to or work in countries with a high rate of hepatitis A should be immunized. Adults who were previously unvaccinated and who anticipate close contact with an international adoptee during the first 60 days after arrival in the [place] from a country with a high rate of hepatitis A should be immunized.  Hepatitis B vaccine. Adults who wish to be protected from this disease, have certain high-risk conditions, may be exposed to blood or other infectious body fluids, are household contacts or sex partners of hepatitis B positive people, are clients or workers in certain care facilities, or travel to or work in countries with a high rate of hepatitis B should be immunized.  Haemophilus influenzae type b (Hib) vaccine. A previously unvaccinated person with asplenia or sickle cell disease or having a scheduled splenectomy should receive 1 dose of Hib vaccine. Regardless of previous immunization, a recipient of a hematopoietic stem cell transplant should receive a 3-dose series 6-12 months after her successful  transplant. Hib vaccine is not recommended for adults with HIV infection. Preventive Services / Frequency Ages 51 to 51 years  Blood pressure check.** / Every 1 to 2 years.  Lipid and cholesterol check.** / Every 5 years beginning at age 74.  Clinical breast exam.** / Every 3 years for women in their 48s and 34s.  BRCA-related cancer risk assessment.** / For women who have family members with a BRCA-related cancer (breast, ovarian, tubal, or peritoneal cancers).  Pap test.** / Every 2 years from ages 53 through 41. Every 3 years starting at age 21 through age 36 or 5 with a history of 3 consecutive normal Pap tests.  HPV screening.** / Every 3 years from ages 14 through ages 62 to 87 with a history of 3 consecutive normal Pap tests.  Hepatitis C blood test.** / For any individual with known risks for hepatitis C.  Skin self-exam. / Monthly.  Influenza vaccine. / Every year.  Tetanus, diphtheria, and acellular pertussis (Tdap, Td) vaccine.** / Consult your health care provider. Pregnant women should receive 1 dose of Tdap vaccine during each pregnancy. 1 dose of Td every 10 years.  Varicella vaccine.** / Consult your health care provider. Pregnant females who do not have evidence of immunity should receive the first dose after pregnancy.  HPV vaccine. / 3 doses over 6 months, if 3 and younger. The vaccine is not recommended for use in pregnant females. However, pregnancy testing is not needed before receiving a dose.  Measles, mumps, rubella (MMR) vaccine.** / You need at least 1 dose of MMR if you were born in 1957 or later. You may also need a 2nd dose. For females of childbearing age, rubella immunity should be determined. If there is no evidence of immunity, females who are not pregnant should be vaccinated. If there is no evidence of immunity, females  who are pregnant should delay immunization until after pregnancy.  Pneumococcal 13-valent conjugate (PCV13) vaccine.** / Consult  your health care provider.  Pneumococcal polysaccharide (PPSV23) vaccine.** / 1 to 2 doses if you smoke cigarettes or if you have certain conditions.  Meningococcal vaccine.** / 1 dose if you are age 14 to 12 years and a [employer] researcher living in a residence hall, or have one of several medical conditions, you need to get vaccinated against meningococcal disease. You may also need additional booster doses.  Hepatitis A vaccine.** / Consult your health care provider.  Hepatitis B vaccine.** / Consult your health care provider.  Haemophilus influenzae type b (Hib) vaccine.** / Consult your health care provider. Ages 53 to 82 years  Blood pressure check.** / Every 1 to 2 years.  Lipid and cholesterol check.** / Every 5 years beginning at age 34 years.  Lung cancer screening. / Every year if you are aged 50-80 years and have a 30-pack-year history of smoking and currently smoke or have quit within the past 15 years. Yearly screening is stopped once you have quit smoking for at least 15 years or develop a health problem that would prevent you from having lung cancer treatment.  Clinical breast exam.** / Every year after age 30 years.  BRCA-related cancer risk assessment.** / For women who have family members with a BRCA-related cancer (breast, ovarian, tubal, or peritoneal cancers).  Mammogram.** / Every year beginning at age 64 years and continuing for as long as you are in good health. Consult with your health care provider.  Pap test.** / Every 3 years starting at age 20 years through age 59 or 4 years with a history of 3 consecutive normal Pap tests.  HPV screening.** / Every 3 years from ages 41 years through ages 76 to 36 years with a history of 3 consecutive normal Pap tests.  Fecal occult blood test (FOBT) of stool. / Every year beginning at age 46 years and continuing until age 84 years. You may not need to do this test if you get a colonoscopy every 10  years.  Flexible sigmoidoscopy or colonoscopy.** / Every 5 years for a flexible sigmoidoscopy or every 10 years for a colonoscopy beginning at age 25 years and continuing until age 42 years.  Hepatitis C blood test.** / For all people born from [birth year] through 1967 and any individual with known risks for hepatitis C.  Skin self-exam. / Monthly.  Influenza vaccine. / Every year.  Tetanus, diphtheria, and acellular pertussis (Tdap/Td) vaccine.** / Consult your health care provider. Pregnant women should receive 1 dose of Tdap vaccine during each pregnancy. 1 dose of Td every 10 years.  Varicella vaccine.** / Consult your health care provider. Pregnant females who do not have evidence of immunity should receive the first dose after pregnancy.  Zoster vaccine.** / 1 dose for adults aged 65 years or older.  Measles, mumps, rubella (MMR) vaccine.** / You need at least 1 dose of MMR if you were born in 1957 or later. You may also need a 2nd dose. For females of childbearing age, rubella immunity should be determined. If there is no evidence of immunity, females who are not pregnant should be vaccinated. If there is no evidence of immunity, females who are pregnant should delay immunization until after pregnancy.  Pneumococcal 13-valent conjugate (PCV13) vaccine.** / Consult your health care provider.  Pneumococcal polysaccharide (PPSV23) vaccine.** / 1 to 2 doses if you smoke cigarettes or if you have certain  conditions.  Meningococcal vaccine.** / Consult your health care provider.  Hepatitis A vaccine.** / Consult your health care provider.  Hepatitis B vaccine.** / Consult your health care provider.  Haemophilus influenzae type b (Hib) vaccine.** / Consult your health care provider. Ages 33 years and over  Blood pressure check.** / Every 1 to 2 years.  Lipid and cholesterol check.** / Every 5 years beginning at age 24 years.  Lung cancer screening. / Every year if you are aged 55-80 years  and have a 30-pack-year history of smoking and currently smoke or have quit within the past 15 years. Yearly screening is stopped once you have quit smoking for at least 15 years or develop a health problem that would prevent you from having lung cancer treatment.  Clinical breast exam.** / Every year after age 96 years.  BRCA-related cancer risk assessment.** / For women who have family members with a BRCA-related cancer (breast, ovarian, tubal, or peritoneal cancers).  Mammogram.** / Every year beginning at age 24 years and continuing for as long as you are in good health. Consult with your health care provider.  Pap test.** / Every 3 years starting at age 14 years through age 47 or 67 years with 3 consecutive normal Pap tests. Testing can be stopped between 65 and 70 years with 3 consecutive normal Pap tests and no abnormal Pap or HPV tests in the past 10 years.  HPV screening.** / Every 3 years from ages 54 years through ages 79 or 32 years with a history of 3 consecutive normal Pap tests. Testing can be stopped between 65 and 70 years with 3 consecutive normal Pap tests and no abnormal Pap or HPV tests in the past 10 years.  Fecal occult blood test (FOBT) of stool. / Every year beginning at age 33 years and continuing until age 43 years. You may not need to do this test if you get a colonoscopy every 10 years.  Flexible sigmoidoscopy or colonoscopy.** / Every 5 years for a flexible sigmoidoscopy or every 10 years for a colonoscopy beginning at age 56 years and continuing until age 55 years.  Hepatitis C blood test.** / For all people born from [birth year] through 1967 and any individual with known risks for hepatitis C.  Osteoporosis screening.** / A one-time screening for women ages 60 years and over and women at risk for fractures or osteoporosis.  Skin self-exam. / Monthly.  Influenza vaccine. / Every year.  Tetanus, diphtheria, and acellular pertussis (Tdap/Td) vaccine.** / 1 dose of Td  every 10 years.  Varicella vaccine.** / Consult your health care provider.  Zoster vaccine.** / 1 dose for adults aged 43 years or older.  Pneumococcal 13-valent conjugate (PCV13) vaccine.** / Consult your health care provider.  Pneumococcal polysaccharide (PPSV23) vaccine.** / 1 dose for all adults aged 43 years and older.  Meningococcal vaccine.** / Consult your health care provider.  Hepatitis A vaccine.** / Consult your health care provider.  Hepatitis B vaccine.** / Consult your health care provider.  Haemophilus influenzae type b (Hib) vaccine.** / Consult your health care provider. ** Family history and personal history of risk and conditions may change your health care provider's recommendations. Document Released: 09/08/2001 Document Revised: 11/27/2013 Document Reviewed: 12/08/2010 [facility] Patient Information 2015 [place], [place]. This information is not intended to replace advice given to you by your health care provider. Make sure you discuss any questions you have with your health care provider.

## 2015-02-13 LAB — COMPREHENSIVE METABOLIC PANEL
ALT: 18 U/L (ref 0–35)
AST: 17 U/L (ref 0–37)
Albumin: 4.2 g/dL (ref 3.5–5.2)
Alkaline Phosphatase: 54 U/L (ref 39–117)
BUN: 12 mg/dL (ref 6–23)
CALCIUM: 9.3 mg/dL (ref 8.4–10.5)
CHLORIDE: 100 meq/L (ref 96–112)
CO2: 28 mEq/L (ref 19–32)
Creatinine, Ser: 0.96 mg/dL (ref 0.40–1.20)
GFR: 66.42 mL/min (ref >60.00)
Glucose, Bld: 82 mg/dL (ref 70–99)
Potassium: 3.9 mEq/L (ref 3.5–5.1)
Sodium: 136 mEq/L (ref 135–145)
Total Bilirubin: 0.4 mg/dL (ref 0.2–1.2)
Total Protein: 7.8 g/dL (ref 6.0–8.3)

## 2015-02-13 LAB — CBC
HCT: 38.1 % (ref 36.0–46.0)
Hemoglobin: 12.6 g/dL (ref 12.0–15.0)
MCHC: 33.2 g/dL (ref 30.0–36.0)
MCV: 86.4 fl (ref 78.0–100.0)
PLATELETS: 226 10*3/uL (ref 150.0–400.0)
RBC: 4.41 Mil/uL (ref 3.87–5.11)
RDW: 15.5 % (ref 11.5–15.5)
WBC: 8.9 10*3/uL (ref 4.0–10.5)

## 2015-02-13 LAB — TSH: TSH: 3.15 u[IU]/mL (ref 0.35–4.50)

## 2015-02-13 LAB — LIPID PANEL
Cholesterol: 188 mg/dL (ref 0–200)
HDL: 60 mg/dL (ref >39.00)
LDL CALC: 108 mg/dL — AB (ref 0–99)
NonHDL: 128
Total CHOL/HDL Ratio: 3
Triglycerides: 101 mg/dL (ref 0.0–149.0)
VLDL: 20.2 mg/dL (ref 0.0–40.0)

## 2015-02-25 ENCOUNTER — Encounter: Payer: Self-pay | Admitting: Family Medicine

## 2015-02-25 NOTE — Assessment & Plan Note (Signed)
Mild. Encouraged heart healthy diet, increase exercise, avoid trans fats, consider a krill oil cap daily 

## 2015-02-25 NOTE — Assessment & Plan Note (Signed)
Avoid offending foods, take probiotics. Do not eat large meals in late evening and consider raising head of bed.  

## 2015-02-25 NOTE — Progress Notes (Signed)
Connie Blanchard  [id number] Feb 26, 1971 02/25/2015      Progress Note-Follow Up  Subjective  Chief Complaint  Chief Complaint  Patient presents with  . Annual Exam    HPI  Patient is a 44 y.o. female in today for routine medical care. Patient in today for annual exam. No recent illness. Struggles with some intermittent back pain but manages with chiropractic care. No recent injury, radicular symptoms or incontinence. Denies CP/palp/SOB/HA/congestion/fevers/GI or GU c/o. Taking meds as prescribed  Past Medical History  Diagnosis Date  . GERD (gastroesophageal reflux disease)   . Obese   . Chronic sinusitis 02/2012    current runny nose of clear drainage  . Iron deficiency anemia   . Dental crowns present   . Acute bronchitis with asthma 06/24/2012  . Acute bronchitis 06/24/2012  . Allergic state 10/15/2013  . Back pain 10/15/2013  . H/O gestational diabetes mellitus, not currently pregnant 07/29/2011    h/o     Past Surgical History  Procedure Laterality Date  . Dilation and curettage of uterus  1987  . Nasal septoplasty w/ turbinoplasty  03/23/2012    Procedure: NASAL SEPTOPLASTY WITH TURBINATE REDUCTION;  Surgeon: Jerrell Belfast, MD;  Location: [facility];  Service: ENT;  Laterality: Bilateral;  . Sinus endo w/fusion  03/23/2012    Procedure: ENDOSCOPIC SINUS SURGERY WITH FUSION NAVIGATION;  Surgeon: Jerrell Belfast, MD;  Location: [facility];  Service: ENT;  Laterality: Bilateral;  with fusion scan    Family History  Problem Relation Age of Onset  . Diabetes Mother     type 2  . Hypertension Mother   . Depression Mother   . Mental illness Mother     bi-polar  . Pulmonary embolism Mother   . Heart disease Maternal Grandfather   . Stroke Maternal Grandfather   . Other Father     drug addiction  . Depression Maternal Aunt 46    suicide attempt with pneumonia    History   Social History  . Marital Status: Married    Spouse Name:  N/A  . Number of Children: N/A  . Years of Education: N/A   Occupational History  . Not on file.   Social History Main Topics  . Smoking status: Former Smoker -- 1.00 packs/day for 19 years    Quit date: 07/28/2003  . Smokeless tobacco: Never Used  . Alcohol Use: Yes     Comment: occasionally  . Drug Use: No  . Sexual Activity:    Partners: Male     Comment: lives with husband, stepson 57, son and daughter. no dietary restrictions. eating heart healthy, walking more   Other Topics Concern  . Not on file   Social History Narrative    Current Outpatient Prescriptions on File Prior to Visit  Medication Sig Dispense Refill  . b complex vitamins tablet Take 1 tablet by mouth daily.    Marland Kitchen esomeprazole (NEXIUM) 40 MG capsule TAKE 1 CAPSULE BY MOUTH DAILY BEFORE BREAKFAST. 90 capsule 0  . ferrous fumarate (HEMOCYTE - 106 MG FE) 325 (106 FE) MG TABS Take 1 tablet (106 mg of iron total) by mouth daily. 90 each 1  . Multiple Vitamin (MULTIVITAMIN) tablet Take 1 tablet by mouth daily.      . Probiotic Product (MISC INTESTINAL FLORA REGULAT) CHEW as needed. Digestive Advantage probiotics by Schiff    . SUMAtriptan (IMITREX) 50 MG tablet Take 1 tablet (50 mg total) by mouth every 2 (two)  hours as needed for migraine or headache. May repeat in 2 hours if headache persists or recurs. 10 tablet 2   No current facility-administered medications on file prior to visit.    Allergies  Allergen Reactions  . Penicillins Rash  . Sulfa Antibiotics Rash    Review of Systems  Review of Systems  Constitutional: Negative for fever and malaise/fatigue.  HENT: Negative for congestion.   Eyes: Negative for discharge.  Respiratory: Negative for shortness of breath.   Cardiovascular: Negative for chest pain, palpitations and leg swelling.  Gastrointestinal: Negative for nausea, abdominal pain and diarrhea.  Genitourinary: Negative for dysuria.  Musculoskeletal: Negative for falls.  Skin: Negative  for rash.  Neurological: Negative for loss of consciousness and headaches.  Endo/Heme/Allergies: Negative for polydipsia.  Psychiatric/Behavioral: Negative for depression and suicidal ideas. The patient is not nervous/anxious and does not have insomnia.     Objective  BP 122/86 mmHg  Pulse 83  Temp(Src) 98.1 F (36.7 C) (Oral)  Ht 5\' 7"  (1.702 m)  Wt 222 lb (100.699 kg)  BMI 34.76 kg/m2  SpO2 98%  LMP 01/28/2015  Physical Exam  Physical Exam  Constitutional: She is oriented to person, place, and time and well-developed, well-nourished, and in no distress. No distress.  HENT:  Head: Normocephalic and atraumatic.  Right Ear: External ear normal.  Left Ear: External ear normal.  Nose: Nose normal.  Mouth/Throat: Oropharynx is clear and moist.  Eyes: Conjunctivae and EOM are normal. Right eye exhibits no discharge. Left eye exhibits no discharge. No scleral icterus.  Neck: Neck supple. No thyromegaly present.  Cardiovascular: Normal rate, regular rhythm and normal heart sounds.   No murmur heard. Pulmonary/Chest: Effort normal and breath sounds normal. She has no wheezes.  Abdominal: Soft. Bowel sounds are normal. She exhibits no distension and no mass.  Musculoskeletal: She exhibits no edema.  Lymphadenopathy:    She has no cervical adenopathy.  Neurological: She is alert and oriented to person, place, and time.  Skin: Skin is warm and dry. No rash noted. She is not diaphoretic.  Psychiatric: Memory, affect and judgment normal.  Nursing note and vitals reviewed.   Lab Results  Component Value Date   TSH 3.15 02/12/2015   Lab Results  Component Value Date   WBC 8.9 02/12/2015   HGB 12.6 02/12/2015   HCT 38.1 02/12/2015   MCV 86.4 02/12/2015   PLT 226.0 02/12/2015   Lab Results  Component Value Date   CREATININE 0.96 02/12/2015   BUN 12 02/12/2015   NA 136 02/12/2015   K 3.9 02/12/2015   CL 100 02/12/2015   CO2 28 02/12/2015   Lab Results  Component Value  Date   ALT 18 02/12/2015   AST 17 02/12/2015   ALKPHOS 54 02/12/2015   BILITOT 0.4 02/12/2015   Lab Results  Component Value Date   CHOL 188 02/12/2015   Lab Results  Component Value Date   HDL 60.00 02/12/2015   Lab Results  Component Value Date   LDLCALC 108* 02/12/2015   Lab Results  Component Value Date   TRIG 101.0 02/12/2015   Lab Results  Component Value Date   CHOLHDL 3 02/12/2015     Assessment & Plan  Hyperlipidemia Mild. Encouraged heart healthy diet, increase exercise, avoid trans fats, consider a krill oil cap daily  Obese Encouraged DASH diet, decrease po intake and increase exercise as tolerated. Needs 7-8 hours of sleep nightly. Avoid trans fats, eat small, frequent meals every 4-5 hours  with lean proteins, complex carbs and healthy fats. Minimize simple carbs, GMO foods.  GERD (gastroesophageal reflux disease) Avoid offending foods, take probiotics. Do not eat large meals in late evening and consider raising head of bed.   Preventative health care Patient encouraged to maintain heart healthy diet, regular exercise, adequate sleep. Consider daily probiotics. Take medications as prescribed. Annual labs reviewed. Sees Dr Donne Hazel of dermatology in [place] of GYN for paps and [facility] and denies any concerns

## 2015-02-25 NOTE — Assessment & Plan Note (Signed)
Encouraged DASH diet, decrease po intake and increase exercise as tolerated. Needs 7-8 hours of sleep nightly. Avoid trans fats, eat small, frequent meals every 4-5 hours with lean proteins, complex carbs and healthy fats. Minimize simple carbs, GMO foods. 

## 2015-02-25 NOTE — Assessment & Plan Note (Signed)
Patient encouraged to maintain heart healthy diet, regular exercise, adequate sleep. Consider daily probiotics. Take medications as prescribed. Annual labs reviewed. Sees Dr Donne Hazel of dermatology in [place] of GYN for paps and [facility] and denies any concerns

## 2015-04-19 ENCOUNTER — Ambulatory Visit: Payer: 59 | Admitting: Medical

## 2015-04-22 ENCOUNTER — Ambulatory Visit (INDEPENDENT_AMBULATORY_CARE_PROVIDER_SITE_OTHER): Payer: 59 | Admitting: Physician Assistant

## 2015-04-22 ENCOUNTER — Encounter: Payer: Self-pay | Admitting: Physician Assistant

## 2015-04-22 VITALS — BP 98/64 | HR 81 | Temp 98.1°F | Resp 16 | Ht 67.0 in | Wt 226.5 lb

## 2015-04-22 DIAGNOSIS — M5090 Cervical disc disorder, unspecified, unspecified cervical region: Secondary | ICD-10-CM

## 2015-04-22 HISTORY — DX: Cervical disc disorder, unspecified, unspecified cervical region: M50.90

## 2015-04-22 MED ORDER — METHYLPREDNISOLONE 4 MG PO TBPK: ORAL_TABLET | ORAL | Status: DC

## 2015-04-22 MED ORDER — CYCLOBENZAPRINE HCL 10 MG PO TABS: 10.0000 mg | ORAL_TABLET | Freq: Every day | ORAL | Status: DC

## 2015-04-22 NOTE — Progress Notes (Signed)
Pre visit review using our clinic review tool, if applicable. No additional management support is needed unless otherwise documented below in the visit note/SLS  

## 2015-04-22 NOTE — Patient Instructions (Signed)
Please start the steroid pack as directed. Take ES Tylenol for breakthrough pain. Increase fluids. No heavy lifting. Apply topical Aspercreme to the neck twice daily. You can use a heating pad 2-3 times per day for 10 minutes to alleviate muscle tension.  Follow-up on Friday by phone. If not resolved, we will need MRI. If resolved we should consider physical therapy to help prevent recurrences.

## 2015-04-22 NOTE — Progress Notes (Signed)
Patient presents to clinic today c/o neck pain with intermittent radiation into R upper extremity x 1 year. Has been seen by Chiropractor for chronic issue stating x-ray obtained revealing loss of disc space at c6-c7. Endorses flare up x 1 week associated with intermittent headaches. Pain radiating into RUE and hand but patient denies numbness, tingling or weakness.  Past Medical History  Diagnosis Date  . GERD (gastroesophageal reflux disease)   . Obese   . Chronic sinusitis 02/2012    current runny nose of clear drainage  . Iron deficiency anemia   . Dental crowns present   . Acute bronchitis with asthma 06/24/2012  . Acute bronchitis 06/24/2012  . Allergic state 10/15/2013  . Back pain 10/15/2013  . H/O gestational diabetes mellitus, not currently pregnant 07/29/2011    h/o     Current Outpatient Prescriptions on File Prior to Visit  Medication Sig Dispense Refill  . b complex vitamins tablet Take 1 tablet by mouth daily.    Marland Kitchen esomeprazole (NEXIUM) 40 MG capsule TAKE 1 CAPSULE BY MOUTH DAILY BEFORE BREAKFAST. 90 capsule 0  . ferrous fumarate (HEMOCYTE - 106 MG FE) 325 (106 FE) MG TABS Take 1 tablet (106 mg of iron total) by mouth daily. 90 each 1  . Multiple Vitamin (MULTIVITAMIN) tablet Take 1 tablet by mouth daily.      . Probiotic Product (MISC INTESTINAL FLORA REGULAT) CHEW as needed. Digestive Advantage probiotics by Schiff    . SUMAtriptan (IMITREX) 50 MG tablet Take 1 tablet (50 mg total) by mouth every 2 (two) hours as needed for migraine or headache. May repeat in 2 hours if headache persists or recurs. 10 tablet 2   No current facility-administered medications on file prior to visit.    Allergies  Allergen Reactions  . Penicillins Rash  . Sulfa Antibiotics Rash    Family History  Problem Relation Age of Onset  . Diabetes Mother     type 2  . Hypertension Mother   . Depression Mother   . Mental illness Mother     bi-polar  . Pulmonary embolism Mother   .  Heart disease Maternal Grandfather   . Stroke Maternal Grandfather   . Other Father     drug addiction  . Depression Maternal Aunt 46    suicide attempt with pneumonia    Social History   Social History  . Marital Status: Married    Spouse Name: N/A  . Number of Children: N/A  . Years of Education: N/A   Social History Main Topics  . Smoking status: Former Smoker -- 1.00 packs/day for 19 years    Quit date: 07/28/2003  . Smokeless tobacco: Never Used  . Alcohol Use: Yes     Comment: occasionally  . Drug Use: No  . Sexual Activity:    Partners: Male     Comment: lives with husband, stepson 61, son and daughter. no dietary restrictions. eating heart healthy, walking more   Other Topics Concern  . None   Social History Narrative    Review of Systems - See HPI.  All other ROS are negative.  BP 98/64 mmHg  Pulse 81  Temp(Src) 98.1 F (36.7 C) (Oral)  Resp 16  Ht 5\' 7"  (1.702 m)  Wt 226 lb 8 oz (102.74 kg)  BMI 35.47 kg/m2  SpO2 98%  LMP 04/08/2015  Physical Exam  Constitutional: She is oriented to person, place, and time and well-developed, well-nourished, and in no distress.  HENT:  Head: Normocephalic and atraumatic.  Eyes: Conjunctivae are normal.  Cardiovascular: Normal rate, regular rhythm, normal heart sounds and intact distal pulses.   Pulmonary/Chest: Effort normal and breath sounds normal. No respiratory distress. She has no wheezes. She has no rales. She exhibits no tenderness.  Musculoskeletal:       Cervical back: She exhibits pain and spasm. She exhibits no tenderness and no bony tenderness.       Thoracic back: Normal.  Neurological: She is alert and oriented to person, place, and time.  Skin: Skin is warm and dry. No rash noted.  Psychiatric: Affect normal.  Vitals reviewed.   Recent Results (from the past 2160 hour(s))  TSH     Status: None   Collection Time: 02/12/15  4:58 PM  Result Value Ref Range   TSH 3.15 0.35 - 4.50 uIU/mL  CBC      Status: None   Collection Time: 02/12/15  4:58 PM  Result Value Ref Range   WBC 8.9 4.0 - 10.5 K/uL   RBC 4.41 3.87 - 5.11 Mil/uL   Platelets 226.0 150.0 - 400.0 K/uL   Hemoglobin 12.6 12.0 - 15.0 g/dL   HCT 38.1 36.0 - 46.0 %   MCV 86.4 78.0 - 100.0 fl   MCHC 33.2 30.0 - 36.0 g/dL   RDW 15.5 11.5 - 15.5 %  Comprehensive metabolic panel     Status: None   Collection Time: 02/12/15  4:58 PM  Result Value Ref Range   Sodium 136 135 - 145 mEq/L   Potassium 3.9 3.5 - 5.1 mEq/L   Chloride 100 96 - 112 mEq/L   CO2 28 19 - 32 mEq/L   Glucose, Bld 82 70 - 99 mg/dL   BUN 12 6 - 23 mg/dL   Creatinine, Ser 0.96 0.40 - 1.20 mg/dL   Total Bilirubin 0.4 0.2 - 1.2 mg/dL   Alkaline Phosphatase 54 39 - 117 U/L   AST 17 0 - 37 U/L   ALT 18 0 - 35 U/L   Total Protein 7.8 6.0 - 8.3 g/dL   Albumin 4.2 3.5 - 5.2 g/dL   Calcium 9.3 8.4 - 10.5 mg/dL   GFR 66.42 >60.00 mL/min  Lipid panel     Status: Abnormal   Collection Time: 02/12/15  4:58 PM  Result Value Ref Range   Cholesterol 188 0 - 200 mg/dL    Comment: ATP III Classification       Desirable:  < 200 mg/dL               Borderline High:  200 - 239 mg/dL          High:  > = 240 mg/dL   Triglycerides 101.0 0.0 - 149.0 mg/dL    Comment: Normal:  <150 mg/dLBorderline High:  150 - 199 mg/dL   HDL 60.00 >39.00 mg/dL   VLDL 20.2 0.0 - 40.0 mg/dL   LDL Cholesterol 108 (H) 0 - 99 mg/dL   Total CHOL/HDL Ratio 3     Comment:                Men          Women1/2 Average Risk     3.4          3.3Average Risk          5.0          4.42X Average Risk          9.6  7.13X Average Risk          15.0          11.0                       NonHDL 128.00     Comment: NOTE:  Non-HDL goal should be 30 mg/dL higher than patient's LDL goal (i.e. LDL goal of < 70 mg/dL, would have non-HDL goal of < 100 mg/dL)    Assessment/Plan: Cervical disc disease Rx Medrol dose pack. ES tylenol for breakthrough pain. Flexeril 10 mg at bedtime. Supportive measures  reviewed. If not resolving will get MRI and proceed with Neurosurgery referral. If resolving, recommend PT to help prevent recurrences.

## 2015-04-22 NOTE — Assessment & Plan Note (Signed)
Rx Medrol dose pack. ES tylenol for breakthrough pain. Flexeril 10 mg at bedtime. Supportive measures reviewed. If not resolving will get MRI and proceed with Neurosurgery referral. If resolving, recommend PT to help prevent recurrences.

## 2015-04-29 ENCOUNTER — Telehealth: Payer: Self-pay | Admitting: Family Medicine

## 2015-04-29 DIAGNOSIS — M5090 Cervical disc disorder, unspecified, unspecified cervical region: Secondary | ICD-10-CM

## 2015-04-29 NOTE — Telephone Encounter (Signed)
Pt called in stating she is feeling better and that if she was better Einar Pheasant would refer to PT for neck pain. Please f/u as needed. Call pt on cell #.

## 2015-04-29 NOTE — Telephone Encounter (Signed)
Referral placed. She will be contacted by PT.

## 2015-09-17 ENCOUNTER — Telehealth: Payer: Self-pay | Admitting: Family Medicine

## 2015-09-17 NOTE — Telephone Encounter (Signed)
LM for pt to call and schedule flu shot or update records. °

## 2015-09-25 ENCOUNTER — Telehealth: Payer: Self-pay | Admitting: Family Medicine

## 2015-09-25 NOTE — Telephone Encounter (Signed)
Patient Name: Connie Blanchard DOB: 12-14-1970 Initial Comment Caller states, elevated blood pressure 169/99 now, she has headaches every day, and has feet swelling wich does not go down, even after sleeping. Sx for about 10 days. Nurse Assessment Nurse: Vallery Sa, RN, Cathy Date/Time (Eastern Time): 09/25/2015 10:25:19 AM Confirm and document reason for call. If symptomatic, describe symptoms. You must click the next button to save text entered. ---Caller states her blood pressure was 169/99 about 30 minutes ago. She has had a headache with feet swelling on and off the past 10 days. No severe breathing difficulty. No chest pain. Alert and responsive. Has the patient traveled out of the country within the last 30 days? ---No Does the patient have any new or worsening symptoms? ---Yes Will a triage be completed? ---Yes Related visit to physician within the last 2 weeks? ---No Does the PT have any chronic conditions? (i.e. diabetes, asthma, etc.) ---Yes List chronic conditions. ---GERD Is the patient pregnant or possibly pregnant? (Ask all females between the ages of 47-55) ---No Is this a behavioral health or substance abuse call? ---No Guidelines Guideline Title Affirmed Question Affirmed Notes High Blood Pressure BP # 160/100 Leg Swelling and Edema [1] MILD swelling of both ankles (i.e., pedal edema) AND [2] new onset or worsening Final Disposition User See PCP When Office is Open (within 3 days) [place], RN, [employer] Referrals REFERRED TO PCP OFFICE Disagree/Comply: Comply Disagree/Comply: Comply

## 2015-09-25 NOTE — Telephone Encounter (Signed)
Pt has appt w/ Dr. Charlett Blake on 09/27/2015.

## 2015-09-25 NOTE — Telephone Encounter (Signed)
Patient called stating that her BP is elevated. Last reading was 169/99. Transferred to [facility]. Spoke with Jori Moll

## 2015-09-27 ENCOUNTER — Telehealth: Payer: Self-pay | Admitting: Family Medicine

## 2015-09-27 ENCOUNTER — Encounter: Payer: Self-pay | Admitting: Family Medicine

## 2015-09-27 ENCOUNTER — Ambulatory Visit (INDEPENDENT_AMBULATORY_CARE_PROVIDER_SITE_OTHER): Payer: 59 | Admitting: Family Medicine

## 2015-09-27 VITALS — BP 152/90 | HR 74 | Temp 99.0°F | Ht 67.0 in | Wt 234.0 lb

## 2015-09-27 DIAGNOSIS — D649 Anemia, unspecified: Secondary | ICD-10-CM | POA: Diagnosis not present

## 2015-09-27 DIAGNOSIS — R600 Localized edema: Secondary | ICD-10-CM

## 2015-09-27 DIAGNOSIS — E782 Mixed hyperlipidemia: Secondary | ICD-10-CM | POA: Diagnosis not present

## 2015-09-27 DIAGNOSIS — I10 Essential (primary) hypertension: Secondary | ICD-10-CM

## 2015-09-27 DIAGNOSIS — E669 Obesity, unspecified: Secondary | ICD-10-CM

## 2015-09-27 DIAGNOSIS — K219 Gastro-esophageal reflux disease without esophagitis: Secondary | ICD-10-CM

## 2015-09-27 LAB — COMPREHENSIVE METABOLIC PANEL
ALT: 15 U/L (ref 0–35)
AST: 16 U/L (ref 0–37)
Albumin: 4.1 g/dL (ref 3.5–5.2)
Alkaline Phosphatase: 47 U/L (ref 39–117)
BILIRUBIN TOTAL: 0.4 mg/dL (ref 0.2–1.2)
BUN: 14 mg/dL (ref 6–23)
CO2: 27 meq/L (ref 19–32)
CREATININE: 0.87 mg/dL (ref 0.40–1.20)
Calcium: 9.2 mg/dL (ref 8.4–10.5)
Chloride: 103 mEq/L (ref 96–112)
GFR: 74.21 mL/min (ref >60.00)
GLUCOSE: 87 mg/dL (ref 70–99)
Potassium: 3.6 mEq/L (ref 3.5–5.1)
Sodium: 138 mEq/L (ref 135–145)
Total Protein: 7.6 g/dL (ref 6.0–8.3)

## 2015-09-27 LAB — CBC
HCT: 34.6 % — ABNORMAL LOW (ref 36.0–46.0)
Hemoglobin: 11.4 g/dL — ABNORMAL LOW (ref 12.0–15.0)
MCHC: 33 g/dL (ref 30.0–36.0)
MCV: 81.8 fl (ref 78.0–100.0)
Platelets: 257 10*3/uL (ref 150.0–400.0)
RBC: 4.23 Mil/uL (ref 3.87–5.11)
RDW: 15.1 % (ref 11.5–15.5)
WBC: 7.8 10*3/uL (ref 4.0–10.5)

## 2015-09-27 LAB — LIPID PANEL
CHOL/HDL RATIO: 3
Cholesterol: 174 mg/dL (ref 0–200)
HDL: 58.1 mg/dL (ref >39.00)
LDL Cholesterol: 99 mg/dL (ref 0–99)
NONHDL: 116.04
Triglycerides: 87 mg/dL (ref 0.0–149.0)
VLDL: 17.4 mg/dL (ref 0.0–40.0)

## 2015-09-27 LAB — TSH: TSH: 2.72 u[IU]/mL (ref 0.35–4.50)

## 2015-09-27 MED ORDER — CHLORTHALIDONE 25 MG PO TABS: 25.0000 mg | ORAL_TABLET | Freq: Every day | ORAL | Status: DC

## 2015-09-27 MED ORDER — SUMATRIPTAN SUCCINATE 50 MG PO TABS: 50.0000 mg | ORAL_TABLET | ORAL | Status: DC | PRN

## 2015-09-27 NOTE — Patient Instructions (Signed)
Hypertension Hypertension, commonly called high blood pressure, is when the force of blood pumping through your arteries is too strong. Your arteries are the blood vessels that carry blood from your heart throughout your body. A blood pressure reading consists of a higher number over a lower number, such as 110/72. The higher number (systolic) is the pressure inside your arteries when your heart pumps. The lower number (diastolic) is the pressure inside your arteries when your heart relaxes. Ideally you want your blood pressure below 120/80. Hypertension forces your heart to work harder to pump blood. Your arteries may become narrow or stiff. Having untreated or uncontrolled hypertension can cause heart attack, stroke, kidney disease, and other problems. RISK FACTORS Some risk factors for high blood pressure are controllable. Others are not.  Risk factors you cannot control include:   Race. You may be at higher risk if you are African American.  Age. Risk increases with age.  Gender. Men are at higher risk than women before age 45 years. After age 65, women are at higher risk than men. Risk factors you can control include:  Not getting enough exercise or physical activity.  Being overweight.  Getting too much fat, sugar, calories, or salt in your diet.  Drinking too much alcohol. SIGNS AND SYMPTOMS Hypertension does not usually cause signs or symptoms. Extremely high blood pressure (hypertensive crisis) may cause headache, anxiety, shortness of breath, and nosebleed. DIAGNOSIS To check if you have hypertension, your health care provider will measure your blood pressure while you are seated, with your arm held at the level of your heart. It should be measured at least twice using the same arm. Certain conditions can cause a difference in blood pressure between your right and left arms. A blood pressure reading that is higher than normal on one occasion does not mean that you need treatment. If  it is not clear whether you have high blood pressure, you may be asked to return on a different day to have your blood pressure checked again. Or, you may be asked to monitor your blood pressure at home for 1 or more weeks. TREATMENT Treating high blood pressure includes making lifestyle changes and possibly taking medicine. Living a healthy lifestyle can help lower high blood pressure. You may need to change some of your habits. Lifestyle changes may include:  Following the DASH diet. This diet is high in fruits, vegetables, and whole grains. It is low in salt, red meat, and added sugars.  Keep your sodium intake below 2,300 mg per day.  Getting at least 30-45 minutes of aerobic exercise at least 4 times per week.  Losing weight if necessary.  Not smoking.  Limiting alcoholic beverages.  Learning ways to reduce stress. Your health care provider may prescribe medicine if lifestyle changes are not enough to get your blood pressure under control, and if one of the following is true:  You are 18-59 years of age and your systolic blood pressure is above 140.  You are 60 years of age or older, and your systolic blood pressure is above 150.  Your diastolic blood pressure is above 90.  You have diabetes, and your systolic blood pressure is over 140 or your diastolic blood pressure is over 90.  You have kidney disease and your blood pressure is above 140/90.  You have heart disease and your blood pressure is above 140/90. Your personal target blood pressure may vary depending on your medical conditions, your age, and other factors. HOME CARE INSTRUCTIONS    Have your blood pressure rechecked as directed by your health care provider.   Take medicines only as directed by your health care provider. Follow the directions carefully. Blood pressure medicines must be taken as prescribed. The medicine does not work as well when you skip doses. Skipping doses also puts you at risk for  problems.  Do not smoke.   Monitor your blood pressure at home as directed by your health care provider. SEEK MEDICAL CARE IF:   You think you are having a reaction to medicines taken.  You have recurrent headaches or feel dizzy.  You have swelling in your ankles.  You have trouble with your vision. SEEK IMMEDIATE MEDICAL CARE IF:  You develop a severe headache or confusion.  You have unusual weakness, numbness, or feel faint.  You have severe chest or abdominal pain.  You vomit repeatedly.  You have trouble breathing. MAKE SURE YOU:   Understand these instructions.  Will watch your condition.  Will get help right away if you are not doing well or get worse.   This information is not intended to replace advice given to you by your health care provider. Make sure you discuss any questions you have with your health care provider.   Document Released: 07/13/2005 Document Revised: 11/27/2014 Document Reviewed: 05/05/2013 Elsevier Interactive Patient Education 2016 [employer]  

## 2015-09-27 NOTE — Telephone Encounter (Signed)
Pt called asking that we please send a mychart msg with the name of the compression hose/sock that was recommended by Dr. Charlett Blake

## 2015-09-27 NOTE — Telephone Encounter (Signed)
Please send her a message that states: Jobst stockings, knee highs on in am and off in pm. 10-20 MM HG.

## 2015-09-27 NOTE — Telephone Encounter (Signed)
Called the patient informed of stockings.

## 2015-09-27 NOTE — Progress Notes (Signed)
Pre visit review using our clinic review tool, if applicable. No additional management support is needed unless otherwise documented below in the visit note. 

## 2015-09-28 LAB — HIV ANTIBODY (ROUTINE TESTING W REFLEX): HIV 1&2 Ab, 4th Generation: NONREACTIVE

## 2015-10-06 DIAGNOSIS — I10 Essential (primary) hypertension: Secondary | ICD-10-CM | POA: Insufficient documentation

## 2015-10-06 DIAGNOSIS — E782 Mixed hyperlipidemia: Secondary | ICD-10-CM | POA: Insufficient documentation

## 2015-10-06 DIAGNOSIS — R600 Localized edema: Secondary | ICD-10-CM | POA: Insufficient documentation

## 2015-10-06 HISTORY — DX: Mixed hyperlipidemia: E78.2

## 2015-10-06 HISTORY — DX: Essential (primary) hypertension: I10

## 2015-10-06 HISTORY — DX: Localized edema: R60.0

## 2015-10-06 NOTE — Assessment & Plan Note (Signed)
Encouraged DASH diet, decrease po intake and increase exercise as tolerated. Needs 7-8 hours of sleep nightly. Avoid trans fats, eat small, frequent meals every 4-5 hours with lean proteins, complex carbs and healthy fats. Minimize simple carbs 

## 2015-10-06 NOTE — Assessment & Plan Note (Signed)
Minimize sodium stay active, elevated feet above heart compression hose and start diuretic

## 2015-10-06 NOTE — Assessment & Plan Note (Signed)
Avoid offending foods, start probiotics. Do not eat large meals in late evening and consider raising head of bed. Nexium daily

## 2015-10-06 NOTE — Assessment & Plan Note (Signed)
Poorly controlled will alter medications, encouraged DASH diet, minimize caffeine and obtain adequate sleep. Report concerning symptoms and follow up as directed and as needed. Add Chlorthalidone

## 2015-10-06 NOTE — Assessment & Plan Note (Signed)
Increase leafy greens, consider increased lean red meat and using cast iron cookware. Continue to monitor, report any concerns 

## 2015-10-06 NOTE — Progress Notes (Signed)
Subjective:    Patient ID: Connie Blanchard, female    DOB: [birth year], 45 y.o.   MRN: [id number]  Chief Complaint  Patient presents with  . Hypertension  . Foot Swelling    HPI Patient is in today for evaluation of rising blood pressure. She notes increased pedal edema b/l as well. Has been worsening over past 2 weeks. Denies any fever or acute illness. No other acute complaints. Denies CP/palp/SOB/HA/congestion/fevers/GI or GU c/o. Taking meds as prescribed  Past Medical History  Diagnosis Date  . GERD (gastroesophageal reflux disease)   . Obese   . Chronic sinusitis 02/2012    current runny nose of clear drainage  . Iron deficiency anemia   . Dental crowns present   . Acute bronchitis with asthma 06/24/2012  . Acute bronchitis 06/24/2012  . Allergic state 10/15/2013  . Back pain 10/15/2013  . H/O gestational diabetes mellitus, not currently pregnant 07/29/2011    h/o     Past Surgical History  Procedure Laterality Date  . Dilation and curettage of uterus  1987  . Nasal septoplasty w/ turbinoplasty  03/23/2012    Procedure: NASAL SEPTOPLASTY WITH TURBINATE REDUCTION;  Surgeon: Jerrell Belfast, MD;  Location: [facility];  Service: ENT;  Laterality: Bilateral;  . Sinus endo w/fusion  03/23/2012    Procedure: ENDOSCOPIC SINUS SURGERY WITH FUSION NAVIGATION;  Surgeon: Jerrell Belfast, MD;  Location: [facility];  Service: ENT;  Laterality: Bilateral;  with fusion scan    Family History  Problem Relation Age of Onset  . Diabetes Mother     type 2  . Hypertension Mother   . Depression Mother   . Mental illness Mother     bi-polar  . Pulmonary embolism Mother   . Heart disease Maternal Grandfather   . Stroke Maternal Grandfather   . Other Father     drug addiction  . Depression Maternal Aunt 46    suicide attempt with pneumonia    Social History   Social History  . Marital Status: Married    Spouse Name: N/A  . Number of Children: N/A  .  Years of Education: N/A   Occupational History  . Not on file.   Social History [place] Topics  . Smoking status: Former Smoker -- 1.00 packs/day for 19 years    Quit date: 07/28/2003  . Smokeless tobacco: Never Used  . Alcohol Use: Yes     Comment: occasionally  . Drug Use: No  . Sexual Activity:    Partners: Male     Comment: lives with husband, stepson 13, son and daughter. no dietary restrictions. eating heart healthy, walking more   Other Topics Concern  . Not on file   Social History Narrative    Outpatient Prescriptions Prior to Visit  Medication Sig Dispense Refill  . b complex vitamins tablet Take 1 tablet by mouth daily.    Marland Kitchen esomeprazole (NEXIUM) 40 MG capsule TAKE 1 CAPSULE BY MOUTH DAILY BEFORE BREAKFAST. 90 capsule 0  . Multiple Vitamin (MULTIVITAMIN) tablet Take 1 tablet by mouth daily.      . Probiotic Product (MISC INTESTINAL FLORA REGULAT) CHEW as needed. Digestive Advantage probiotics by Schiff    . cyclobenzaprine (FLEXERIL) 10 MG tablet Take 1 tablet (10 mg total) by mouth at bedtime. (Patient not taking: Reported on 09/27/2015) 30 tablet 0  . ferrous fumarate (HEMOCYTE - 106 MG FE) 325 (106 FE) MG TABS Take 1 tablet (106 mg of iron total) by  mouth daily. (Patient not taking: Reported on 09/27/2015) 90 each 1  . methylPREDNISolone (MEDROL DOSEPAK) 4 MG TBPK tablet Take following package directions 21 tablet 0  . SUMAtriptan (IMITREX) 50 MG tablet Take 1 tablet (50 mg total) by mouth every 2 (two) hours as needed for migraine or headache. May repeat in 2 hours if headache persists or recurs. (Patient not taking: Reported on 09/27/2015) 10 tablet 2   No facility-administered medications prior to visit.    Allergies  Allergen Reactions  . Penicillins Rash  . Sulfa Antibiotics Rash    Review of Systems  Constitutional: Positive for malaise/fatigue. Negative for fever.  HENT: Negative for congestion.   Eyes: Negative for blurred vision.  Respiratory: Negative for  shortness of breath.   Cardiovascular: Positive for leg swelling. Negative for chest pain and palpitations.  Gastrointestinal: Negative for nausea, abdominal pain and blood in stool.  Genitourinary: Negative for dysuria and frequency.  Musculoskeletal: Negative for falls.  Skin: Negative for rash.  Neurological: Negative for dizziness, loss of consciousness and headaches.  Endo/Heme/Allergies: Negative for environmental allergies.  Psychiatric/Behavioral: Negative for depression. The patient is not nervous/anxious.        Objective:    Physical Exam  Constitutional: She is oriented to person, place, and time. She appears well-developed and well-nourished. No distress.  HENT:  Head: Normocephalic and atraumatic.  Nose: Nose normal.  Eyes: Right eye exhibits no discharge. Left eye exhibits no discharge.  Neck: Normal range of motion. Neck supple.  Cardiovascular: Normal rate and regular rhythm.   No murmur heard. Pulmonary/Chest: Effort normal and breath sounds normal.  Abdominal: Soft. Bowel sounds are normal. There is no tenderness.  Musculoskeletal: She exhibits edema.  1+ b/l LE  Neurological: She is alert and oriented to person, place, and time.  Skin: Skin is warm and dry.  Psychiatric: She has a normal mood and affect.  Nursing note and vitals reviewed.   BP 152/90 mmHg  Pulse 74  Temp(Src) 99 F (37.2 C) (Oral)  Ht 5\' 7"  (1.702 m)  Wt 234 lb (106.142 kg)  BMI 36.64 kg/m2  SpO2 98% Wt Readings from Last 3 Encounters:  09/27/15 234 lb (106.142 kg)  04/22/15 226 lb 8 oz (102.74 kg)  02/12/15 222 lb (100.699 kg)     Lab Results  Component Value Date   WBC 7.8 09/27/2015   HGB 11.4* 09/27/2015   HCT 34.6* 09/27/2015   PLT 257.0 09/27/2015   GLUCOSE 87 09/27/2015   CHOL 174 09/27/2015   TRIG 87.0 09/27/2015   HDL 58.10 09/27/2015   LDLCALC 99 09/27/2015   ALT 15 09/27/2015   AST 16 09/27/2015   NA 138 09/27/2015   K 3.6 09/27/2015   CL 103 09/27/2015    CREATININE 0.87 09/27/2015   BUN 14 09/27/2015   CO2 27 09/27/2015   TSH 2.72 09/27/2015    Lab Results  Component Value Date   TSH 2.72 09/27/2015   Lab Results  Component Value Date   WBC 7.8 09/27/2015   HGB 11.4* 09/27/2015   HCT 34.6* 09/27/2015   MCV 81.8 09/27/2015   PLT 257.0 09/27/2015   Lab Results  Component Value Date   NA 138 09/27/2015   K 3.6 09/27/2015   CO2 27 09/27/2015   GLUCOSE 87 09/27/2015   BUN 14 09/27/2015   CREATININE 0.87 09/27/2015   BILITOT 0.4 09/27/2015   ALKPHOS 47 09/27/2015   AST 16 09/27/2015   ALT 15 09/27/2015   PROT 7.6  09/27/2015   ALBUMIN 4.1 09/27/2015   CALCIUM 9.2 09/27/2015   GFR 74.21 09/27/2015   Lab Results  Component Value Date   CHOL 174 09/27/2015   Lab Results  Component Value Date   HDL 58.10 09/27/2015   Lab Results  Component Value Date   LDLCALC 99 09/27/2015   Lab Results  Component Value Date   TRIG 87.0 09/27/2015   Lab Results  Component Value Date   CHOLHDL 3 09/27/2015   No results found for: HGBA1C     Assessment & Plan:   Problem List Items Addressed This Visit    Anemia    Increase leafy greens, consider increased lean red meat and using cast iron cookware. Continue to monitor, report any concerns      Essential hypertension - Primary    Poorly controlled will alter medications, encouraged DASH diet, minimize caffeine and obtain adequate sleep. Report concerning symptoms and follow up as directed and as needed. Add Chlorthalidone      Relevant Medications   SUMAtriptan (IMITREX) 50 MG tablet   chlorthalidone (HYGROTON) 25 MG tablet   Other Relevant Orders   TSH (Completed)   CBC (Completed)   Comprehensive metabolic panel (Completed)   Lipid panel (Completed)   HIV antibody (with reflex) (Completed)   GERD (gastroesophageal reflux disease)    Avoid offending foods, start probiotics. Do not eat large meals in late evening and consider raising head of bed. Nexium daily       Hyperlipidemia, mixed   Relevant Medications   SUMAtriptan (IMITREX) 50 MG tablet   chlorthalidone (HYGROTON) 25 MG tablet   Other Relevant Orders   TSH (Completed)   CBC (Completed)   Comprehensive metabolic panel (Completed)   Lipid panel (Completed)   HIV antibody (with reflex) (Completed)   Obese    Encouraged DASH diet, decrease po intake and increase exercise as tolerated. Needs 7-8 hours of sleep nightly. Avoid trans fats, eat small, frequent meals every 4-5 hours with lean proteins, complex carbs and healthy fats. Minimize simple carbs      Pedal edema    Minimize sodium stay active, elevated feet above heart compression hose and start diuretic      Relevant Medications   SUMAtriptan (IMITREX) 50 MG tablet   Other Relevant Orders   TSH (Completed)   CBC (Completed)   Comprehensive metabolic panel (Completed)   Lipid panel (Completed)   HIV antibody (with reflex) (Completed)      I have discontinued Ms. Aiken's ferrous fumarate and methylPREDNISolone. I am also having her start on chlorthalidone. Additionally, I am having her maintain her multivitamin, Misc Intestinal Flora Regulat, esomeprazole, b complex vitamins, cyclobenzaprine, and SUMAtriptan.  Meds ordered this encounter  Medications  . SUMAtriptan (IMITREX) 50 MG tablet    Sig: Take 1 tablet (50 mg total) by mouth every 2 (two) hours as needed for migraine or headache. May repeat in 2 hours if headache persists or recurs.    Dispense:  10 tablet    Refill:  2  . chlorthalidone (HYGROTON) 25 MG tablet    Sig: Take 1 tablet (25 mg total) by mouth daily.    Dispense:  30 tablet    Refill:  3     Penni Homans, MD

## 2015-10-29 ENCOUNTER — Ambulatory Visit (INDEPENDENT_AMBULATORY_CARE_PROVIDER_SITE_OTHER): Payer: 59 | Admitting: Family Medicine

## 2015-10-29 ENCOUNTER — Other Ambulatory Visit: Payer: Self-pay | Admitting: Family Medicine

## 2015-10-29 ENCOUNTER — Other Ambulatory Visit (INDEPENDENT_AMBULATORY_CARE_PROVIDER_SITE_OTHER): Payer: 59

## 2015-10-29 VITALS — BP 132/82 | HR 72

## 2015-10-29 DIAGNOSIS — I10 Essential (primary) hypertension: Secondary | ICD-10-CM | POA: Diagnosis not present

## 2015-10-29 LAB — COMPREHENSIVE METABOLIC PANEL
ALBUMIN: 4.2 g/dL (ref 3.5–5.2)
ALK PHOS: 50 U/L (ref 39–117)
ALT: 18 U/L (ref 0–35)
AST: 15 U/L (ref 0–37)
BUN: 16 mg/dL (ref 6–23)
CALCIUM: 9.6 mg/dL (ref 8.4–10.5)
CHLORIDE: 96 meq/L (ref 96–112)
CO2: 35 mEq/L — ABNORMAL HIGH (ref 19–32)
Creatinine, Ser: 0.93 mg/dL (ref 0.40–1.20)
GFR: 68.69 mL/min (ref >60.00)
Glucose, Bld: 106 mg/dL — ABNORMAL HIGH (ref 70–99)
POTASSIUM: 3.2 meq/L — AB (ref 3.5–5.1)
Sodium: 136 mEq/L (ref 135–145)
TOTAL PROTEIN: 8 g/dL (ref 6.0–8.3)
Total Bilirubin: 0.4 mg/dL (ref 0.2–1.2)

## 2015-10-29 MED ORDER — POTASSIUM CHLORIDE CRYS ER 20 MEQ PO TBCR: 20.0000 meq | EXTENDED_RELEASE_TABLET | Freq: Once | ORAL | Status: DC

## 2015-10-29 NOTE — Progress Notes (Signed)
Pre visit review using our clinic review tool, if applicable. No additional management support is needed unless otherwise documented below in the visit note.  Per 09/27/15 AVS: Return in about 4 weeks (around 10/25/2015), or RN bp check with CMP lab in 4 wk.   Per Dr. Charlett Blake: Continue current medications. CMP today. Follow up as scheduled.  Pt verbalized understanding of instructions.   Next appt w/ Dr. Charlett Blake: 12/30/15  Dorrene German, RN

## 2015-10-29 NOTE — Progress Notes (Signed)
RN blood check note reviewed. Agree with documention and plan. 

## 2015-12-30 ENCOUNTER — Ambulatory Visit: Payer: 59 | Admitting: Family Medicine

## 2016-01-13 ENCOUNTER — Ambulatory Visit: Payer: 59 | Admitting: Family Medicine

## 2016-01-17 ENCOUNTER — Other Ambulatory Visit: Payer: Self-pay | Admitting: Family Medicine

## 2016-02-06 ENCOUNTER — Ambulatory Visit (INDEPENDENT_AMBULATORY_CARE_PROVIDER_SITE_OTHER): Payer: 59 | Admitting: Family Medicine

## 2016-02-06 ENCOUNTER — Encounter: Payer: Self-pay | Admitting: Family Medicine

## 2016-02-06 VITALS — BP 110/72 | HR 73 | Temp 97.0°F | Ht 67.0 in | Wt 221.0 lb

## 2016-02-06 DIAGNOSIS — D649 Anemia, unspecified: Secondary | ICD-10-CM | POA: Diagnosis not present

## 2016-02-06 DIAGNOSIS — K219 Gastro-esophageal reflux disease without esophagitis: Secondary | ICD-10-CM

## 2016-02-06 DIAGNOSIS — I10 Essential (primary) hypertension: Secondary | ICD-10-CM

## 2016-02-06 DIAGNOSIS — E782 Mixed hyperlipidemia: Secondary | ICD-10-CM

## 2016-02-06 DIAGNOSIS — R600 Localized edema: Secondary | ICD-10-CM

## 2016-02-06 DIAGNOSIS — E669 Obesity, unspecified: Secondary | ICD-10-CM | POA: Diagnosis not present

## 2016-02-06 LAB — CBC
HCT: 35.5 % — ABNORMAL LOW (ref 36.0–46.0)
HEMOGLOBIN: 11.7 g/dL — AB (ref 12.0–15.0)
MCHC: 33 g/dL (ref 30.0–36.0)
MCV: 81.9 fl (ref 78.0–100.0)
Platelets: 316 10*3/uL (ref 150.0–400.0)
RBC: 4.33 Mil/uL (ref 3.87–5.11)
RDW: 16 % — AB (ref 11.5–15.5)
WBC: 7.8 10*3/uL (ref 4.0–10.5)

## 2016-02-06 LAB — COMPREHENSIVE METABOLIC PANEL
ALBUMIN: 4.3 g/dL (ref 3.5–5.2)
ALK PHOS: 56 U/L (ref 39–117)
ALT: 26 U/L (ref 0–35)
AST: 22 U/L (ref 0–37)
BILIRUBIN TOTAL: 0.4 mg/dL (ref 0.2–1.2)
BUN: 12 mg/dL (ref 6–23)
CALCIUM: 9.6 mg/dL (ref 8.4–10.5)
CO2: 34 meq/L — AB (ref 19–32)
Chloride: 97 mEq/L (ref 96–112)
Creatinine, Ser: 0.89 mg/dL (ref 0.40–1.20)
GFR: 72.18 mL/min (ref >60.00)
Glucose, Bld: 93 mg/dL (ref 70–99)
Potassium: 3.2 mEq/L — ABNORMAL LOW (ref 3.5–5.1)
Sodium: 136 mEq/L (ref 135–145)
TOTAL PROTEIN: 8.4 g/dL — AB (ref 6.0–8.3)

## 2016-02-06 MED ORDER — CHLORTHALIDONE 25 MG PO TABS: ORAL_TABLET | ORAL | Status: DC

## 2016-02-06 MED ORDER — POTASSIUM CHLORIDE CRYS ER 20 MEQ PO TBCR: 20.0000 meq | EXTENDED_RELEASE_TABLET | Freq: Once | ORAL | Status: DC

## 2016-02-06 NOTE — Assessment & Plan Note (Signed)
Encouraged DASH diet, decrease po intake and increase exercise as tolerated. Needs 7-8 hours of sleep nightly. Avoid trans fats, eat small, frequent meals every 4-5 hours with lean proteins, complex carbs and healthy fats. Minimize simple carbs 

## 2016-02-06 NOTE — Assessment & Plan Note (Signed)
Well controlled, no changes to meds. Encouraged heart healthy diet such as the DASH diet and exercise as tolerated.  °

## 2016-02-06 NOTE — Patient Instructions (Signed)

## 2016-02-06 NOTE — Assessment & Plan Note (Signed)
Encouraged heart healthy diet, increase exercise, avoid trans fats, consider a krill oil cap daily 

## 2016-02-06 NOTE — Progress Notes (Signed)
Pre visit review using our clinic review tool, if applicable. No additional management support is needed unless otherwise documented below in the visit note. 

## 2016-02-07 ENCOUNTER — Other Ambulatory Visit: Payer: Self-pay | Admitting: Family Medicine

## 2016-02-07 DIAGNOSIS — E876 Hypokalemia: Secondary | ICD-10-CM

## 2016-02-11 ENCOUNTER — Other Ambulatory Visit: Payer: Self-pay | Admitting: Family Medicine

## 2016-02-23 NOTE — Assessment & Plan Note (Signed)
Avoid offending foods, start probiotics. Do not eat large meals in late evening and consider raising head of bed.  

## 2016-02-23 NOTE — Assessment & Plan Note (Signed)
Encouraged to minimize sodium, elevate feet above heart for at least 15 minutes twice daily, consider compression hose and report if worsens

## 2016-02-23 NOTE — Progress Notes (Signed)
Patient ID: Connie Blanchard, female   DOB: [birth year], 45 y.o.   MRN: [id number]   Subjective:    Patient ID: Connie Blanchard, female    DOB: 10-06-1970, 45 y.o.   MRN: [id number]  Chief Complaint  Patient presents with  . Follow-up    HPI Patient is in today for follow up. She is doing fairly well today. No recent illness or hospitalization. No recent major change in her diet. Is noting some recent flare in pedal edema but it resolves with elevation and is not highly symptomatic. Denies CP/palp/SOB/HA/congestion/fevers/GI or GU c/o. Taking meds as prescribed  Past Medical History:  Diagnosis Date  . Acute bronchitis 06/24/2012  . Acute bronchitis with asthma 06/24/2012  . Allergic state 10/15/2013  . Back pain 10/15/2013  . Chronic sinusitis 02/2012   current runny nose of clear drainage  . Dental crowns present   . GERD (gastroesophageal reflux disease)   . H/O gestational diabetes mellitus, not currently pregnant 07/29/2011   h/o   . Iron deficiency anemia   . Obese     Past Surgical History:  Procedure Laterality Date  . DILATION AND CURETTAGE OF UTERUS  1987  . NASAL SEPTOPLASTY W/ TURBINOPLASTY  03/23/2012   Procedure: NASAL SEPTOPLASTY WITH TURBINATE REDUCTION;  Surgeon: Jerrell Belfast, MD;  Location: [place];  Service: ENT;  Laterality: Bilateral;  . SINUS ENDO W/FUSION  03/23/2012   Procedure: ENDOSCOPIC SINUS SURGERY WITH FUSION NAVIGATION;  Surgeon: Jerrell Belfast, MD;  Location: [place];  Service: ENT;  Laterality: Bilateral;  with fusion scan    Family History  Problem Relation Age of Onset  . Diabetes Mother     type 2  . Hypertension Mother   . Depression Mother   . Mental illness Mother     bi-polar  . Pulmonary embolism Mother   . Heart disease Maternal Grandfather   . Stroke Maternal Grandfather   . Other Father     drug addiction  . Depression Maternal Aunt 46    suicide attempt with pneumonia    Social History     Social History  . Marital status: Married    Spouse name: N/A  . Number of children: N/A  . Years of education: N/A   Occupational History  . Not on file.   Social History Blanchard Topics  . Smoking status: Former Smoker    Packs/day: 1.00    Years: 19.00    Quit date: 07/28/2003  . Smokeless tobacco: Never Used  . Alcohol use Yes     Comment: occasionally  . Drug use: No  . Sexual activity: Yes    Partners: Male     Comment: lives with husband, stepson 30, son and daughter. no dietary restrictions. eating heart healthy, walking more   Other Topics Concern  . Not on file   Social History Narrative  . No narrative on file    Outpatient Medications Prior to Visit  Medication Sig Dispense Refill  . b complex vitamins tablet Take 1 tablet by mouth daily.    . cyclobenzaprine (FLEXERIL) 10 MG tablet Take 1 tablet (10 mg total) by mouth at bedtime. 30 tablet 0  . esomeprazole (NEXIUM) 40 MG capsule TAKE 1 CAPSULE BY MOUTH DAILY BEFORE BREAKFAST. 90 capsule 0  . Ferrous Sulfate (IRON) 90 (18 Fe) MG TABS Take by mouth.    . Multiple Vitamin (MULTIVITAMIN) tablet Take 1 tablet by mouth daily.      . Probiotic  Product (MISC INTESTINAL FLORA REGULAT) CHEW as needed. Digestive Advantage probiotics by [employer]    . SUMAtriptan (IMITREX) 50 MG tablet Take 1 tablet (50 mg total) by mouth every 2 (two) hours as needed for migraine or headache. May repeat in 2 hours if headache persists or recurs. 10 tablet 2  . chlorthalidone (HYGROTON) 25 MG tablet TAKE 1 TABLET (25 MG TOTAL) BY MOUTH DAILY. 30 tablet 6  . potassium chloride SA (K-DUR,KLOR-CON) 20 MEQ tablet Take 1 tablet (20 mEq total) by mouth once. 30 tablet 3   No facility-administered medications prior to visit.     Allergies  Allergen Reactions  . Penicillins Rash  . Sulfa Antibiotics Rash    Review of Systems  Constitutional: Negative for fever and malaise/fatigue.  HENT: Negative for congestion.   Eyes: Negative for blurred  vision.  Respiratory: Negative for shortness of breath.   Cardiovascular: Positive for leg swelling. Negative for chest pain and palpitations.  Gastrointestinal: Negative for abdominal pain, blood in stool and nausea.  Genitourinary: Negative for dysuria and frequency.  Musculoskeletal: Negative for falls.  Skin: Negative for rash.  Neurological: Negative for dizziness, loss of consciousness and headaches.  Endo/Heme/Allergies: Negative for environmental allergies.  Psychiatric/Behavioral: Negative for depression. The patient is not nervous/anxious.        Objective:    Physical Exam  Constitutional: She is oriented to person, place, and time. She appears well-developed and well-nourished. No distress.  HENT:  Head: Normocephalic and atraumatic.  Nose: Nose normal.  Eyes: Right eye exhibits no discharge. Left eye exhibits no discharge.  Neck: Normal range of motion. Neck supple.  Cardiovascular: Normal rate and regular rhythm.   No murmur heard. Pulmonary/Chest: Effort normal and breath sounds normal.  Abdominal: Soft. Bowel sounds are normal. There is no tenderness.  Musculoskeletal: She exhibits edema.  Trace pedal edema b/l  Neurological: She is alert and oriented to person, place, and time.  Skin: Skin is warm and dry.  Psychiatric: She has a normal mood and affect.  Nursing note and vitals reviewed.   BP 110/72   Pulse 73   Temp 97 F (36.1 C) (Oral)   Ht _0  (1.702 m)   Wt 221 lb (100.2 kg)   SpO2 97%   BMI 34.61 kg/m  Wt Readings from Last 3 Encounters:  02/06/16 221 lb (100.2 kg)  09/27/15 234 lb (106.1 kg)  04/22/15 226 lb 8 oz (102.7 kg)     Lab Results  Component Value Date   WBC 7.8 02/06/2016   HGB 11.7 (L) 02/06/2016   HCT 35.5 (L) 02/06/2016   PLT 316.0 02/06/2016   GLUCOSE 93 02/06/2016   CHOL 174 09/27/2015   TRIG 87.0 09/27/2015   HDL 58.10 09/27/2015   LDLCALC 99 09/27/2015   ALT 26 02/06/2016   AST 22 02/06/2016   NA 136 02/06/2016     K 3.2 (L) 02/06/2016   CL 97 02/06/2016   CREATININE 0.89 02/06/2016   BUN 12 02/06/2016   CO2 34 (H) 02/06/2016   TSH 2.72 09/27/2015    Lab Results  Component Value Date   TSH 2.72 09/27/2015   Lab Results  Component Value Date   WBC 7.8 02/06/2016   HGB 11.7 (L) 02/06/2016   HCT 35.5 (L) 02/06/2016   MCV 81.9 02/06/2016   PLT 316.0 02/06/2016   Lab Results  Component Value Date   NA 136 02/06/2016   K 3.2 (L) 02/06/2016   CO2 34 (H) 02/06/2016  GLUCOSE 93 02/06/2016   BUN 12 02/06/2016   CREATININE 0.89 02/06/2016   BILITOT 0.4 02/06/2016   ALKPHOS 56 02/06/2016   AST 22 02/06/2016   ALT 26 02/06/2016   PROT 8.4 (H) 02/06/2016   ALBUMIN 4.3 02/06/2016   CALCIUM 9.6 02/06/2016   GFR 72.18 02/06/2016   Lab Results  Component Value Date   CHOL 174 09/27/2015   Lab Results  Component Value Date   HDL 58.10 09/27/2015   Lab Results  Component Value Date   LDLCALC 99 09/27/2015   Lab Results  Component Value Date   TRIG 87.0 09/27/2015   Lab Results  Component Value Date   CHOLHDL 3 09/27/2015   No results found for: HGBA1C     Assessment & Plan:   Problem List Items Addressed This Visit    GERD (gastroesophageal reflux disease)    Avoid offending foods, start probiotics. Do not eat large meals in late evening and consider raising head of bed.       Obese    Encouraged DASH diet, decrease po intake and increase exercise as tolerated. Needs 7-8 hours of sleep nightly. Avoid trans fats, eat small, frequent meals every 4-5 hours with lean proteins, complex carbs and healthy fats. Minimize simple carbs      Relevant Medications   potassium chloride SA (K-DUR,KLOR-CON) 20 MEQ tablet   chlorthalidone (HYGROTON) 25 MG tablet   Other Relevant Orders   Comp Met (CMET) (Completed)   Anemia   Relevant Orders   CBC (Completed)   Essential hypertension - Primary    Well controlled, no changes to meds. Encouraged heart healthy diet such as the DASH  diet and exercise as tolerated.       Relevant Medications   potassium chloride SA (K-DUR,KLOR-CON) 20 MEQ tablet   chlorthalidone (HYGROTON) 25 MG tablet   Other Relevant Orders   Comp Met (CMET) (Completed)   Pedal edema    Encouraged to minimize sodium, elevate feet above heart for at least 15 minutes twice daily, consider compression hose and report if worsens      Hyperlipidemia, mixed    Encouraged heart healthy diet, increase exercise, avoid trans fats, consider a krill oil cap daily      Relevant Medications   potassium chloride SA (K-DUR,KLOR-CON) 20 MEQ tablet   chlorthalidone (HYGROTON) 25 MG tablet   Other Relevant Orders   Comp Met (CMET) (Completed)    Other Visit Diagnoses   None.     I am having Ms. Berline Lopes maintain her multivitamin, Misc Intestinal Flora Regulat, esomeprazole, b complex vitamins, cyclobenzaprine, SUMAtriptan, Iron, potassium chloride SA, and chlorthalidone.  Meds ordered this encounter  Medications  . potassium chloride SA (K-DUR,KLOR-CON) 20 MEQ tablet    Sig: Take 1 tablet (20 mEq total) by mouth once.    Dispense:  90 tablet    Refill:  3  . chlorthalidone (HYGROTON) 25 MG tablet    Sig: TAKE 1 TABLET (25 MG TOTAL) BY MOUTH DAILY.    Dispense:  90 tablet    Refill:  3     Penni Homans, MD

## 2016-02-27 ENCOUNTER — Other Ambulatory Visit: Payer: 59

## 2016-03-02 ENCOUNTER — Other Ambulatory Visit (INDEPENDENT_AMBULATORY_CARE_PROVIDER_SITE_OTHER): Payer: 59

## 2016-03-02 DIAGNOSIS — E876 Hypokalemia: Secondary | ICD-10-CM | POA: Diagnosis not present

## 2016-03-02 LAB — COMPREHENSIVE METABOLIC PANEL
ALBUMIN: 3.9 g/dL (ref 3.5–5.2)
ALT: 15 U/L (ref 0–35)
AST: 16 U/L (ref 0–37)
Alkaline Phosphatase: 47 U/L (ref 39–117)
BILIRUBIN TOTAL: 0.3 mg/dL (ref 0.2–1.2)
BUN: 11 mg/dL (ref 6–23)
CALCIUM: 9 mg/dL (ref 8.4–10.5)
CHLORIDE: 99 meq/L (ref 96–112)
CO2: 30 mEq/L (ref 19–32)
CREATININE: 0.89 mg/dL (ref 0.40–1.20)
GFR: 72.16 mL/min (ref >60.00)
Glucose, Bld: 127 mg/dL — ABNORMAL HIGH (ref 70–99)
Potassium: 3.2 mEq/L — ABNORMAL LOW (ref 3.5–5.1)
Sodium: 137 mEq/L (ref 135–145)
Total Protein: 7.6 g/dL (ref 6.0–8.3)

## 2016-03-03 ENCOUNTER — Encounter: Payer: 59 | Admitting: Family Medicine

## 2016-03-03 ENCOUNTER — Other Ambulatory Visit: Payer: Self-pay | Admitting: Family Medicine

## 2016-03-03 DIAGNOSIS — E876 Hypokalemia: Secondary | ICD-10-CM

## 2016-03-16 ENCOUNTER — Other Ambulatory Visit (INDEPENDENT_AMBULATORY_CARE_PROVIDER_SITE_OTHER): Payer: 59

## 2016-03-16 DIAGNOSIS — E876 Hypokalemia: Secondary | ICD-10-CM

## 2016-03-16 LAB — COMPREHENSIVE METABOLIC PANEL
ALK PHOS: 53 U/L (ref 39–117)
ALT: 17 U/L (ref 0–35)
AST: 17 U/L (ref 0–37)
Albumin: 4.1 g/dL (ref 3.5–5.2)
BUN: 10 mg/dL (ref 6–23)
CO2: 29 meq/L (ref 19–32)
Calcium: 8.8 mg/dL (ref 8.4–10.5)
Chloride: 97 mEq/L (ref 96–112)
Creatinine, Ser: 0.93 mg/dL (ref 0.40–1.20)
GFR: 68.58 mL/min (ref >60.00)
GLUCOSE: 110 mg/dL — AB (ref 70–99)
POTASSIUM: 3.3 meq/L — AB (ref 3.5–5.1)
Sodium: 136 mEq/L (ref 135–145)
Total Bilirubin: 0.3 mg/dL (ref 0.2–1.2)
Total Protein: 7.7 g/dL (ref 6.0–8.3)

## 2016-03-17 ENCOUNTER — Telehealth: Payer: Self-pay | Admitting: Family Medicine

## 2016-03-17 ENCOUNTER — Other Ambulatory Visit: Payer: 59

## 2016-03-17 NOTE — Telephone Encounter (Signed)
Patient had already been informed of lab results and that is what her call back was for .

## 2016-03-17 NOTE — Telephone Encounter (Signed)
Caller name: Emilee Relation to pt: self Call back number: [phone] Pharmacy:  Reason for call: Pt was called and was returning call to get her labs results, pt would like to be called back at her cell number mentioned above. Please advise.

## 2016-03-18 NOTE — Telephone Encounter (Signed)
Patient called to follow up on yesterdays conversation about changing her medication. Please adv.

## 2016-03-18 NOTE — Telephone Encounter (Signed)
See result note from Dr. Charlett Blake dated for today. Called patient and left message to return call.

## 2016-03-19 ENCOUNTER — Telehealth: Payer: Self-pay | Admitting: Family Medicine

## 2016-03-19 MED ORDER — TRIAMTERENE-HCTZ 37.5-25 MG PO TABS: 1.0000 | ORAL_TABLET | Freq: Every day | ORAL | 2 refills | Status: DC

## 2016-03-19 NOTE — Telephone Encounter (Signed)
Candis at [facility] - [phone]   She called in because she says that pt was prescribed triamterene today but pt is also prescribed a potassium medication. Pharmacy would like to make sure that provider is aware and meant to prescribe both medications due to the reaction that could be caused by using both?    Please advise

## 2016-03-19 NOTE — Telephone Encounter (Signed)
Pharmacist informed of PCP instructions. 

## 2016-03-19 NOTE — Telephone Encounter (Signed)
Yes she runs chronically low on potassium so we need to replenish her and we are going to recheck her potassium next week and assess whether to continue or stop potassiumr

## 2016-03-31 ENCOUNTER — Ambulatory Visit (INDEPENDENT_AMBULATORY_CARE_PROVIDER_SITE_OTHER): Payer: 59 | Admitting: Family Medicine

## 2016-03-31 VITALS — BP 116/73 | HR 74

## 2016-03-31 DIAGNOSIS — R252 Cramp and spasm: Secondary | ICD-10-CM

## 2016-03-31 DIAGNOSIS — E876 Hypokalemia: Secondary | ICD-10-CM

## 2016-03-31 DIAGNOSIS — I10 Essential (primary) hypertension: Secondary | ICD-10-CM | POA: Diagnosis not present

## 2016-03-31 LAB — COMPLETE METABOLIC PANEL WITH GFR
ALT: 16 U/L (ref 6–29)
AST: 17 U/L (ref 10–35)
Albumin: 3.9 g/dL (ref 3.6–5.1)
Alkaline Phosphatase: 47 U/L (ref 33–115)
BUN: 12 mg/dL (ref 7–25)
CALCIUM: 8.7 mg/dL (ref 8.6–10.2)
CHLORIDE: 103 mmol/L (ref 98–110)
CO2: 27 mmol/L (ref 20–31)
Creat: 0.99 mg/dL (ref 0.50–1.10)
GFR, Est African American: 78 mL/min (ref >=60)
GFR, Est Non African American: 68 mL/min (ref >=60)
Glucose, Bld: 99 mg/dL (ref 65–99)
POTASSIUM: 4.6 mmol/L (ref 3.5–5.3)
Sodium: 138 mmol/L (ref 135–146)
Total Bilirubin: 0.3 mg/dL (ref 0.2–1.2)
Total Protein: 7.2 g/dL (ref 6.1–8.1)

## 2016-03-31 LAB — MAGNESIUM: MAGNESIUM: 2 mg/dL (ref 1.5–2.5)

## 2016-03-31 NOTE — Progress Notes (Signed)
Pre visit review using our clinic review tool, if applicable. No additional management support is needed unless otherwise documented below in the visit note.  Patient presents in office for blood pressure check per lab note on 03/16/16. Reviewed medications with the patient. During the visit patient reported that she's been experiencing some aches in both the right & left calves. PCP was made aware and ordered for patient to have a magnesium lab drawn, in addition to the other recommended lab work. Today's reading was BP 116/73 P 74.  Per Dr. Charlett Blake: Continue current medication regimen. Complete lab work today for [facility] & Magnesium. Follow-up with PCP in 8 weeks for an office visit.  Informed patient of the provider's instructions. She verbalized understanding and did not have any further concerns before leaving the nurse visit.  Next appointment scheduled for 05/28/16 at 8:00 AM.  RN blood pressure check note reviewed. Agree with documention and plan. Penni Homans, MD

## 2016-03-31 NOTE — Patient Instructions (Signed)
Per Dr. Charlett Blake: Continue current medication regimen. Complete lab work today for [facility] & Magnesium. Follow-up with PCP in 8 weeks for an office visit.

## 2016-04-20 ENCOUNTER — Encounter: Payer: Self-pay | Admitting: Family Medicine

## 2016-04-21 ENCOUNTER — Other Ambulatory Visit: Payer: Self-pay | Admitting: Family Medicine

## 2016-04-21 MED ORDER — POTASSIUM CHLORIDE CRYS ER 20 MEQ PO TBCR: 20.0000 meq | EXTENDED_RELEASE_TABLET | Freq: Three times a day (TID) | ORAL | 3 refills | Status: DC

## 2016-05-07 ENCOUNTER — Other Ambulatory Visit: Payer: Self-pay | Admitting: Family Medicine

## 2016-05-07 MED ORDER — TRIAMTERENE-HCTZ 37.5-25 MG PO TABS: 1.0000 | ORAL_TABLET | Freq: Every day | ORAL | 2 refills | Status: DC

## 2016-05-19 ENCOUNTER — Ambulatory Visit (INDEPENDENT_AMBULATORY_CARE_PROVIDER_SITE_OTHER): Payer: 59 | Admitting: Family Medicine

## 2016-05-19 ENCOUNTER — Encounter: Payer: Self-pay | Admitting: Family Medicine

## 2016-05-19 VITALS — BP 108/60 | HR 78 | Temp 98.7°F | Ht 67.0 in | Wt 233.0 lb

## 2016-05-19 DIAGNOSIS — E782 Mixed hyperlipidemia: Secondary | ICD-10-CM | POA: Diagnosis not present

## 2016-05-19 DIAGNOSIS — K219 Gastro-esophageal reflux disease without esophagitis: Secondary | ICD-10-CM

## 2016-05-19 DIAGNOSIS — I10 Essential (primary) hypertension: Secondary | ICD-10-CM

## 2016-05-19 DIAGNOSIS — Z0000 Encounter for general adult medical examination without abnormal findings: Secondary | ICD-10-CM | POA: Diagnosis not present

## 2016-05-19 DIAGNOSIS — D649 Anemia, unspecified: Secondary | ICD-10-CM

## 2016-05-19 NOTE — Patient Instructions (Signed)
Preventive Care for Adults, Female A healthy lifestyle and preventive care can promote health and wellness. Preventive health guidelines for women include the following key practices.  A routine yearly physical is a good way to check with your health care provider about your health and preventive screening. It is a chance to share any concerns and updates on your health and to receive a thorough exam.  Visit your dentist for a routine exam and preventive care every 6 months. Brush your teeth twice a day and floss once a day. Good oral hygiene prevents tooth decay and gum disease.  The frequency of eye exams is based on your age, health, family medical history, use of contact lenses, and other factors. Follow your health care provider's recommendations for frequency of eye exams.  Eat a healthy diet. Foods like vegetables, fruits, whole grains, low-fat dairy products, and lean protein foods contain the nutrients you need without too many calories. Decrease your intake of foods high in solid fats, added sugars, and salt. Eat the right amount of calories for you.Get information about a proper diet from your health care provider, if necessary.  Regular physical exercise is one of the most important things you can do for your health. Most adults should get at least 150 minutes of moderate-intensity exercise (any activity that increases your heart rate and causes you to sweat) each week. In addition, most adults need muscle-strengthening exercises on 2 or more days a week.  Maintain a healthy weight. The body mass index (BMI) is a screening tool to identify possible weight problems. It provides an estimate of body fat based on height and weight. Your health care provider can find your BMI and can help you achieve or maintain a healthy weight.For adults 20 years and older:  A BMI below 18.5 is considered underweight.  A BMI of 18.5 to 24.9 is normal.  A BMI of 25 to 29.9 is considered overweight.  A  BMI of 30 and above is considered obese.  Maintain normal blood lipids and cholesterol levels by exercising and minimizing your intake of saturated fat. Eat a balanced diet with plenty of fruit and vegetables. Blood tests for lipids and cholesterol should begin at age 45 and be repeated every 5 years. If your lipid or cholesterol levels are high, you are over 50, or you are at high risk for heart disease, you may need your cholesterol levels checked more frequently.Ongoing high lipid and cholesterol levels should be treated with medicines if diet and exercise are not working.  If you smoke, find out from your health care provider how to quit. If you do not use tobacco, do not start.  Lung cancer screening is recommended for adults aged 45-80 years who are at high risk for developing lung cancer because of a history of smoking. A yearly low-dose CT scan of the lungs is recommended for people who have at least a 30-pack-year history of smoking and are a current smoker or have quit within the past 15 years. A pack year of smoking is smoking an average of 1 pack of cigarettes a day for 1 year (for example: 1 pack a day for 30 years or 2 packs a day for 15 years). Yearly screening should continue until the smoker has stopped smoking for at least 15 years. Yearly screening should be stopped for people who develop a health problem that would prevent them from having lung cancer treatment.  If you are pregnant, do not drink alcohol. If you are  breastfeeding, be very cautious about drinking alcohol. If you are not pregnant and choose to drink alcohol, do not have more than 1 drink per day. One drink is considered to be 12 ounces (355 mL) of beer, 5 ounces (148 mL) of wine, or 1.5 ounces (44 mL) of liquor.  Avoid use of street drugs. Do not share needles with anyone. Ask for help if you need support or instructions about stopping the use of drugs.  High blood pressure causes heart disease and increases the risk  of stroke. Your blood pressure should be checked at least every 1 to 2 years. Ongoing high blood pressure should be treated with medicines if weight loss and exercise do not work.  If you are 63-79 years old, ask your health care provider if you should take aspirin to prevent strokes.  Diabetes screening is done by taking a blood sample to check your blood glucose level after you have not eaten for a certain period of time (fasting). If you are not overweight and you do not have risk factors for diabetes, you should be screened once every 3 years starting at age 61. If you are overweight or obese and you are 47-53 years of age, you should be screened for diabetes every year as part of your cardiovascular risk assessment.  Breast cancer screening is essential preventive care for women. You should practice "breast self-awareness." This means understanding the normal appearance and feel of your breasts and may include breast self-examination. Any changes detected, no matter how small, should be reported to a health care provider. Women in their 67s and 30s should have a clinical breast exam (CBE) by a health care provider as part of a regular health exam every 1 to 3 years. After age 31, women should have a CBE every year. Starting at age 93, women should consider having a mammogram (breast X-ray test) every year. Women who have a family history of breast cancer should talk to their health care provider about genetic screening. Women at a high risk of breast cancer should talk to their health care providers about having an MRI and a mammogram every year.  Breast cancer gene (BRCA)-related cancer risk assessment is recommended for women who have family members with BRCA-related cancers. BRCA-related cancers include breast, ovarian, tubal, and peritoneal cancers. Having family members with these cancers may be associated with an increased risk for harmful changes (mutations) in the breast cancer genes BRCA1 and  BRCA2. Results of the assessment will determine the need for genetic counseling and BRCA1 and BRCA2 testing.  Your health care provider may recommend that you be screened regularly for cancer of the pelvic organs (ovaries, uterus, and vagina). This screening involves a pelvic examination, including checking for microscopic changes to the surface of your cervix (Pap test). You may be encouraged to have this screening done every 3 years, beginning at age 15.  For women ages 33-65, health care providers may recommend pelvic exams and Pap testing every 3 years, or they may recommend the Pap and pelvic exam, combined with testing for human papilloma virus (HPV), every 5 years. Some types of HPV increase your risk of cervical cancer. Testing for HPV may also be done on women of any age with unclear Pap test results.  Other health care providers may not recommend any screening for nonpregnant women who are considered low risk for pelvic cancer and who do not have symptoms. Ask your health care provider if a screening pelvic exam is right for  you.  If you have had past treatment for cervical cancer or a condition that could lead to cancer, you need Pap tests and screening for cancer for at least 20 years after your treatment. If Pap tests have been discontinued, your risk factors (such as having a new sexual partner) need to be reassessed to determine if screening should resume. Some women have medical problems that increase the chance of getting cervical cancer. In these cases, your health care provider may recommend more frequent screening and Pap tests.  Colorectal cancer can be detected and often prevented. Most routine colorectal cancer screening begins at the age of 49 years and continues through age 40 years. However, your health care provider may recommend screening at an earlier age if you have risk factors for colon cancer. On a yearly basis, your health care provider may provide home test kits to check  for hidden blood in the stool. Use of a small camera at the end of a tube, to directly examine the colon (sigmoidoscopy or colonoscopy), can detect the earliest forms of colorectal cancer. Talk to your health care provider about this at age 86, when routine screening begins. Direct exam of the colon should be repeated every 5-10 years through age 85 years, unless early forms of precancerous polyps or small growths are found.  People who are at an increased risk for hepatitis B should be screened for this virus. You are considered at high risk for hepatitis B if:  You were born in a country where hepatitis B occurs often. Talk with your health care provider about which countries are considered high risk.  Your parents were born in a high-risk country and you have not received a shot to protect against hepatitis B (hepatitis B vaccine).  You have HIV or AIDS.  You use needles to inject street drugs.  You live with, or have sex with, someone who has hepatitis B.  You get hemodialysis treatment.  You take certain medicines for conditions like cancer, organ transplantation, and autoimmune conditions.  Hepatitis C blood testing is recommended for all people born from [birth year] through 1965 and any individual with known risks for hepatitis C.  Practice safe sex. Use condoms and avoid high-risk sexual practices to reduce the spread of sexually transmitted infections (STIs). STIs include gonorrhea, chlamydia, syphilis, trichomonas, herpes, HPV, and human immunodeficiency virus (HIV). Herpes, HIV, and HPV are viral illnesses that have no cure. They can result in disability, cancer, and death.  You should be screened for sexually transmitted illnesses (STIs) including gonorrhea and chlamydia if:  You are sexually active and are younger than 24 years.  You are older than 24 years and your health care provider tells you that you are at risk for this type of infection.  Your sexual activity has changed  since you were last screened and you are at an increased risk for chlamydia or gonorrhea. Ask your health care provider if you are at risk.  If you are at risk of being infected with HIV, it is recommended that you take a prescription medicine daily to prevent HIV infection. This is called preexposure prophylaxis (PrEP). You are considered at risk if:  You are sexually active and do not regularly use condoms or know the HIV status of your partner(s).  You take drugs by injection.  You are sexually active with a partner who has HIV.  Talk with your health care provider about whether you are at high risk of being infected with HIV. If  you choose to begin PrEP, you should first be tested for HIV. You should then be tested every 3 months for as long as you are taking PrEP.  Osteoporosis is a disease in which the bones lose minerals and strength with aging. This can result in serious bone fractures or breaks. The risk of osteoporosis can be identified using a bone density scan. Women ages 1 years and over and women at risk for fractures or osteoporosis should discuss screening with their health care providers. Ask your health care provider whether you should take a calcium supplement or vitamin D to reduce the rate of osteoporosis.  Menopause can be associated with physical symptoms and risks. Hormone replacement therapy is available to decrease symptoms and risks. You should talk to your health care provider about whether hormone replacement therapy is right for you.  Use sunscreen. Apply sunscreen liberally and repeatedly throughout the day. You should seek shade when your shadow is shorter than you. Protect yourself by wearing long sleeves, pants, a wide-brimmed hat, and sunglasses year round, whenever you are outdoors.  Once a month, do a whole body skin exam, using a mirror to look at the skin on your back. Tell your health care provider of new moles, moles that have irregular borders, moles that  are larger than a pencil eraser, or moles that have changed in shape or color.  Stay current with required vaccines (immunizations).  Influenza vaccine. All adults should be immunized every year.  Tetanus, diphtheria, and acellular pertussis (Td, Tdap) vaccine. Pregnant women should receive 1 dose of Tdap vaccine during each pregnancy. The dose should be obtained regardless of the length of time since the last dose. Immunization is preferred during the 27th-36th week of gestation. An adult who has not previously received Tdap or who does not know her vaccine status should receive 1 dose of Tdap. This initial dose should be followed by tetanus and diphtheria toxoids (Td) booster doses every 10 years. Adults with an unknown or incomplete history of completing a 3-dose immunization series with Td-containing vaccines should begin or complete a primary immunization series including a Tdap dose. Adults should receive a Td booster every 10 years.  Varicella vaccine. An adult without evidence of immunity to varicella should receive 2 doses or a second dose if she has previously received 1 dose. Pregnant females who do not have evidence of immunity should receive the first dose after pregnancy. This first dose should be obtained before leaving the health care facility. The second dose should be obtained 4-8 weeks after the first dose.  Human papillomavirus (HPV) vaccine. Females aged 13-26 years who have not received the vaccine previously should obtain the 3-dose series. The vaccine is not recommended for use in pregnant females. However, pregnancy testing is not needed before receiving a dose. If a female is found to be pregnant after receiving a dose, no treatment is needed. In that case, the remaining doses should be delayed until after the pregnancy. Immunization is recommended for any person with an immunocompromised condition through the age of 61 years if she did not get any or all doses earlier. During the  3-dose series, the second dose should be obtained 4-8 weeks after the first dose. The third dose should be obtained 24 weeks after the first dose and 16 weeks after the second dose.  Zoster vaccine. One dose is recommended for adults aged 1 years or older unless certain conditions are present.  Measles, mumps, and rubella (MMR) vaccine. Adults born  before 1957 generally are considered immune to measles and mumps. Adults born in [birth year] or later should have 1 or more doses of MMR vaccine unless there is a contraindication to the vaccine or there is laboratory evidence of immunity to each of the three diseases. A routine second dose of MMR vaccine should be obtained at least 28 days after the first dose for students attending postsecondary schools, health care workers, or international travelers. People who received inactivated measles vaccine or an unknown type of measles vaccine during 1963-1967 should receive 2 doses of MMR vaccine. People who received inactivated mumps vaccine or an unknown type of mumps vaccine before 1979 and are at high risk for mumps infection should consider immunization with 2 doses of MMR vaccine. For females of childbearing age, rubella immunity should be determined. If there is no evidence of immunity, females who are not pregnant should be vaccinated. If there is no evidence of immunity, females who are pregnant should delay immunization until after pregnancy. Unvaccinated health care workers born before [birth year] who lack laboratory evidence of measles, mumps, or rubella immunity or laboratory confirmation of disease should consider measles and mumps immunization with 2 doses of MMR vaccine or rubella immunization with 1 dose of MMR vaccine.  Pneumococcal 13-valent conjugate (PCV13) vaccine. When indicated, a person who is uncertain of his immunization history and has no record of immunization should receive the PCV13 vaccine. All adults 59 years of age and older should receive this  vaccine. An adult aged 15 years or older who has certain medical conditions and has not been previously immunized should receive 1 dose of PCV13 vaccine. This PCV13 should be followed with a dose of pneumococcal polysaccharide (PPSV23) vaccine. Adults who are at high risk for pneumococcal disease should obtain the PPSV23 vaccine at least 8 weeks after the dose of PCV13 vaccine. Adults older than 47 years of age who have normal immune system function should obtain the PPSV23 vaccine dose at least 1 year after the dose of PCV13 vaccine.  Pneumococcal polysaccharide (PPSV23) vaccine. When PCV13 is also indicated, PCV13 should be obtained first. All adults aged 5 years and older should be immunized. An adult younger than age 54 years who has certain medical conditions should be immunized. Any person who resides in a nursing home or long-term care facility should be immunized. An adult smoker should be immunized. People with an immunocompromised condition and certain other conditions should receive both PCV13 and PPSV23 vaccines. People with human immunodeficiency virus (HIV) infection should be immunized as soon as possible after diagnosis. Immunization during chemotherapy or radiation therapy should be avoided. Routine use of PPSV23 vaccine is not recommended for American Indians, [place] Natives, or people younger than 65 years unless there are medical conditions that require PPSV23 vaccine. When indicated, people who have unknown immunization and have no record of immunization should receive PPSV23 vaccine. One-time revaccination 5 years after the first dose of PPSV23 is recommended for people aged 19-64 years who have chronic kidney failure, nephrotic syndrome, asplenia, or immunocompromised conditions. People who received 1-2 doses of PPSV23 before age 61 years should receive another dose of PPSV23 vaccine at age 65 years or later if at least 5 years have passed since the previous dose. Doses of PPSV23 are not  needed for people immunized with PPSV23 at or after age 37 years.  Meningococcal vaccine. Adults with asplenia or persistent complement component deficiencies should receive 2 doses of quadrivalent meningococcal conjugate (MenACWY-D) vaccine. The doses should be obtained  at least 2 months apart. Microbiologists working with certain meningococcal bacteria, [place] recruits, people at risk during an outbreak, and people who travel to or live in countries with a high rate of meningitis should be immunized. A first-year college student up through age 34 years who is living in a residence hall should receive a dose if she did not receive a dose on or after her 16th birthday. Adults who have certain high-risk conditions should receive one or more doses of vaccine.  Hepatitis A vaccine. Adults who wish to be protected from this disease, have certain high-risk conditions, work with hepatitis A-infected animals, work in hepatitis A research labs, or travel to or work in countries with a high rate of hepatitis A should be immunized. Adults who were previously unvaccinated and who anticipate close contact with an international adoptee during the first 60 days after arrival in the [place] from a country with a high rate of hepatitis A should be immunized.  Hepatitis B vaccine. Adults who wish to be protected from this disease, have certain high-risk conditions, may be exposed to blood or other infectious body fluids, are household contacts or sex partners of hepatitis B positive people, are clients or workers in certain care facilities, or travel to or work in countries with a high rate of hepatitis B should be immunized.  Haemophilus influenzae type b (Hib) vaccine. A previously unvaccinated person with asplenia or sickle cell disease or having a scheduled splenectomy should receive 1 dose of Hib vaccine. Regardless of previous immunization, a recipient of a hematopoietic stem cell transplant should receive a  3-dose series 6-12 months after her successful transplant. Hib vaccine is not recommended for adults with HIV infection. Preventive Services / Frequency Ages 35 to 4 years  Blood pressure check.** / Every 3-5 years.  Lipid and cholesterol check.** / Every 5 years beginning at age 60.  Clinical breast exam.** / Every 3 years for women in their 71s and 10s.  BRCA-related cancer risk assessment.** / For women who have family members with a BRCA-related cancer (breast, ovarian, tubal, or peritoneal cancers).  Pap test.** / Every 2 years from ages 76 through 26. Every 3 years starting at age 61 through age 76 or 93 with a history of 3 consecutive normal Pap tests.  HPV screening.** / Every 3 years from ages 37 through ages 60 to 51 with a history of 3 consecutive normal Pap tests.  Hepatitis C blood test.** / For any individual with known risks for hepatitis C.  Skin self-exam. / Monthly.  Influenza vaccine. / Every year.  Tetanus, diphtheria, and acellular pertussis (Tdap, Td) vaccine.** / Consult your health care provider. Pregnant women should receive 1 dose of Tdap vaccine during each pregnancy. 1 dose of Td every 10 years.  Varicella vaccine.** / Consult your health care provider. Pregnant females who do not have evidence of immunity should receive the first dose after pregnancy.  HPV vaccine. / 3 doses over 6 months, if 93 and younger. The vaccine is not recommended for use in pregnant females. However, pregnancy testing is not needed before receiving a dose.  Measles, mumps, rubella (MMR) vaccine.** / You need at least 1 dose of MMR if you were born in 1957 or later. You may also need a 2nd dose. For females of childbearing age, rubella immunity should be determined. If there is no evidence of immunity, females who are not pregnant should be vaccinated. If there is no evidence of immunity, females who are  pregnant should delay immunization until after pregnancy.  Pneumococcal  13-valent conjugate (PCV13) vaccine.** / Consult your health care provider.  Pneumococcal polysaccharide (PPSV23) vaccine.** / 1 to 2 doses if you smoke cigarettes or if you have certain conditions.  Meningococcal vaccine.** / 1 dose if you are age 68 to 8 years and a Market researcher living in a residence hall, or have one of several medical conditions, you need to get vaccinated against meningococcal disease. You may also need additional booster doses.  Hepatitis A vaccine.** / Consult your health care provider.  Hepatitis B vaccine.** / Consult your health care provider.  Haemophilus influenzae type b (Hib) vaccine.** / Consult your health care provider. Ages 7 to 53 years  Blood pressure check.** / Every year.  Lipid and cholesterol check.** / Every 5 years beginning at age 25 years.  Lung cancer screening. / Every year if you are aged 11-80 years and have a 30-pack-year history of smoking and currently smoke or have quit within the past 15 years. Yearly screening is stopped once you have quit smoking for at least 15 years or develop a health problem that would prevent you from having lung cancer treatment.  Clinical breast exam.** / Every year after age 48 years.  BRCA-related cancer risk assessment.** / For women who have family members with a BRCA-related cancer (breast, ovarian, tubal, or peritoneal cancers).  Mammogram.** / Every year beginning at age 41 years and continuing for as long as you are in good health. Consult with your health care provider.  Pap test.** / Every 3 years starting at age 65 years through age 37 or 70 years with a history of 3 consecutive normal Pap tests.  HPV screening.** / Every 3 years from ages 72 years through ages 60 to 40 years with a history of 3 consecutive normal Pap tests.  Fecal occult blood test (FOBT) of stool. / Every year beginning at age 21 years and continuing until age 5 years. You may not need to do this test if you get  a colonoscopy every 10 years.  Flexible sigmoidoscopy or colonoscopy.** / Every 5 years for a flexible sigmoidoscopy or every 10 years for a colonoscopy beginning at age 35 years and continuing until age 48 years.  Hepatitis C blood test.** / For all people born from [birth year] through 1967 and any individual with known risks for hepatitis C.  Skin self-exam. / Monthly.  Influenza vaccine. / Every year.  Tetanus, diphtheria, and acellular pertussis (Tdap/Td) vaccine.** / Consult your health care provider. Pregnant women should receive 1 dose of Tdap vaccine during each pregnancy. 1 dose of Td every 10 years.  Varicella vaccine.** / Consult your health care provider. Pregnant females who do not have evidence of immunity should receive the first dose after pregnancy.  Zoster vaccine.** / 1 dose for adults aged 30 years or older.  Measles, mumps, rubella (MMR) vaccine.** / You need at least 1 dose of MMR if you were born in 1957 or later. You may also need a second dose. For females of childbearing age, rubella immunity should be determined. If there is no evidence of immunity, females who are not pregnant should be vaccinated. If there is no evidence of immunity, females who are pregnant should delay immunization until after pregnancy.  Pneumococcal 13-valent conjugate (PCV13) vaccine.** / Consult your health care provider.  Pneumococcal polysaccharide (PPSV23) vaccine.** / 1 to 2 doses if you smoke cigarettes or if you have certain conditions.  Meningococcal vaccine.** /  Consult your health care provider.  Hepatitis A vaccine.** / Consult your health care provider.  Hepatitis B vaccine.** / Consult your health care provider.  Haemophilus influenzae type b (Hib) vaccine.** / Consult your health care provider. Ages 64 years and over  Blood pressure check.** / Every year.  Lipid and cholesterol check.** / Every 5 years beginning at age 23 years.  Lung cancer screening. / Every year if you  are aged 16-80 years and have a 30-pack-year history of smoking and currently smoke or have quit within the past 15 years. Yearly screening is stopped once you have quit smoking for at least 15 years or develop a health problem that would prevent you from having lung cancer treatment.  Clinical breast exam.** / Every year after age 74 years.  BRCA-related cancer risk assessment.** / For women who have family members with a BRCA-related cancer (breast, ovarian, tubal, or peritoneal cancers).  Mammogram.** / Every year beginning at age 44 years and continuing for as long as you are in good health. Consult with your health care provider.  Pap test.** / Every 3 years starting at age 58 years through age 22 or 39 years with 3 consecutive normal Pap tests. Testing can be stopped between 65 and 70 years with 3 consecutive normal Pap tests and no abnormal Pap or HPV tests in the past 10 years.  HPV screening.** / Every 3 years from ages 64 years through ages 70 or 61 years with a history of 3 consecutive normal Pap tests. Testing can be stopped between 65 and 70 years with 3 consecutive normal Pap tests and no abnormal Pap or HPV tests in the past 10 years.  Fecal occult blood test (FOBT) of stool. / Every year beginning at age 40 years and continuing until age 27 years. You may not need to do this test if you get a colonoscopy every 10 years.  Flexible sigmoidoscopy or colonoscopy.** / Every 5 years for a flexible sigmoidoscopy or every 10 years for a colonoscopy beginning at age 7 years and continuing until age 32 years.  Hepatitis C blood test.** / For all people born from [birth year] through 1967 and any individual with known risks for hepatitis C.  Osteoporosis screening.** / A one-time screening for women ages 30 years and over and women at risk for fractures or osteoporosis.  Skin self-exam. / Monthly.  Influenza vaccine. / Every year.  Tetanus, diphtheria, and acellular pertussis (Tdap/Td)  vaccine.** / 1 dose of Td every 10 years.  Varicella vaccine.** / Consult your health care provider.  Zoster vaccine.** / 1 dose for adults aged 35 years or older.  Pneumococcal 13-valent conjugate (PCV13) vaccine.** / Consult your health care provider.  Pneumococcal polysaccharide (PPSV23) vaccine.** / 1 dose for all adults aged 46 years and older.  Meningococcal vaccine.** / Consult your health care provider.  Hepatitis A vaccine.** / Consult your health care provider.  Hepatitis B vaccine.** / Consult your health care provider.  Haemophilus influenzae type b (Hib) vaccine.** / Consult your health care provider. ** Family history and personal history of risk and conditions may change your health care provider's recommendations.   This information is not intended to replace advice given to you by your health care provider. Make sure you discuss any questions you have with your health care provider.   Document Released: 09/08/2001 Document Revised: 08/03/2014 Document Reviewed: 12/08/2010 Elsevier Interactive Patient Education [employer].

## 2016-05-19 NOTE — Progress Notes (Signed)
Pre visit review using our clinic review tool, if applicable. No additional management support is needed unless otherwise documented below in the visit note. 

## 2016-05-20 LAB — COMPREHENSIVE METABOLIC PANEL
ALBUMIN: 4.1 g/dL (ref 3.5–5.2)
ALK PHOS: 47 U/L (ref 39–117)
ALT: 15 U/L (ref 0–35)
AST: 15 U/L (ref 0–37)
BUN: 10 mg/dL (ref 6–23)
CALCIUM: 9.2 mg/dL (ref 8.4–10.5)
CHLORIDE: 101 meq/L (ref 96–112)
CO2: 30 mEq/L (ref 19–32)
Creatinine, Ser: 0.91 mg/dL (ref 0.40–1.20)
GFR: 70.26 mL/min (ref >60.00)
Glucose, Bld: 89 mg/dL (ref 70–99)
POTASSIUM: 3.8 meq/L (ref 3.5–5.1)
SODIUM: 137 meq/L (ref 135–145)
TOTAL PROTEIN: 7.9 g/dL (ref 6.0–8.3)
Total Bilirubin: 0.3 mg/dL (ref 0.2–1.2)

## 2016-05-20 LAB — TSH: TSH: 3.24 u[IU]/mL (ref 0.35–4.50)

## 2016-05-20 LAB — CBC
HCT: 31.4 % — ABNORMAL LOW (ref 36.0–46.0)
Hemoglobin: 10.3 g/dL — ABNORMAL LOW (ref 12.0–15.0)
MCHC: 32.7 g/dL (ref 30.0–36.0)
MCV: 76 fl — AB (ref 78.0–100.0)
Platelets: 275 10*3/uL (ref 150.0–400.0)
RBC: 4.14 Mil/uL (ref 3.87–5.11)
RDW: 16.6 % — AB (ref 11.5–15.5)
WBC: 7 10*3/uL (ref 4.0–10.5)

## 2016-05-22 ENCOUNTER — Other Ambulatory Visit: Payer: Self-pay | Admitting: Family Medicine

## 2016-05-22 DIAGNOSIS — K625 Hemorrhage of anus and rectum: Secondary | ICD-10-CM

## 2016-05-24 NOTE — Assessment & Plan Note (Signed)
Encouraged heart healthy diet, increase exercise, avoid trans fats, consider a krill oil cap daily 

## 2016-05-24 NOTE — Assessment & Plan Note (Signed)
Patient encouraged to maintain heart healthy diet, regular exercise, adequate sleep. Consider daily probiotics. Take medications as prescribed. Given and reviewed copy of ACP documents from [place] Secretary of State and encouraged to complete and return 

## 2016-05-24 NOTE — Progress Notes (Signed)
Patient ID: Connie Blanchard, female   DOB: Jun 01, 1971, 45 y.o.   MRN: [id number]   Subjective:    Patient ID: Connie Blanchard, female    DOB: [birth year], 45 y.o.   MRN: [id number]  Chief Complaint  Patient presents with  . Annual Exam    HPI Patient is in today for annual preventative exam and follow up on numerous medical concerns. No recent illness or hospitalizations. Is trying to maintain a heart healthy diet and stay active. Denies CP/palp/SOB/HA/congestion/fevers/GI or GU c/o. Taking meds as prescribed  Past Medical History:  Diagnosis Date  . Acute bronchitis 06/24/2012  . Acute bronchitis with asthma 06/24/2012  . Allergic state 10/15/2013  . Back pain 10/15/2013  . Chronic sinusitis 02/2012   current runny nose of clear drainage  . Dental crowns present   . GERD (gastroesophageal reflux disease)   . H/O gestational diabetes mellitus, not currently pregnant 07/29/2011   h/o   . Iron deficiency anemia   . Obese     Past Surgical History:  Procedure Laterality Date  . DILATION AND CURETTAGE OF UTERUS  1987  . NASAL SEPTOPLASTY W/ TURBINOPLASTY  03/23/2012   Procedure: NASAL SEPTOPLASTY WITH TURBINATE REDUCTION;  Surgeon: Jerrell Belfast, MD;  Location: [facility];  Service: ENT;  Laterality: Bilateral;  . SINUS ENDO W/FUSION  03/23/2012   Procedure: ENDOSCOPIC SINUS SURGERY WITH FUSION NAVIGATION;  Surgeon: Jerrell Belfast, MD;  Location: [facility];  Service: ENT;  Laterality: Bilateral;  with fusion scan    Family History  Problem Relation Age of Onset  . Diabetes Mother     type 2  . Hypertension Mother   . Depression Mother   . Mental illness Mother     bi-polar  . Pulmonary embolism Mother   . Heart disease Maternal Grandfather   . Stroke Maternal Grandfather   . Other Father     drug addiction  . Depression Maternal Aunt 46    suicide attempt with pneumonia    Social History   Social History  . Marital status: Married   Spouse name: N/A  . Number of children: N/A  . Years of education: N/A   Occupational History  . Not on file.   Social History Blanch[place] Topics  . Smoking status: Former Smoker    Packs/day: 1.00    Years: 19.00    Quit date: 07/28/2003  . Smokeless tobacco: Never Used  . Alcohol use Yes     Comment: occasionally  . Drug use: No  . Sexual activity: Yes    Partners: Male     Comment: lives with husband, stepson 105, son and daughter. no dietary restrictions. eating heart healthy, walking more   Other Topics Concern  . Not on file   Social History Narrative  . No narrative on file    Outpatient Medications Prior to Visit  Medication Sig Dispense Refill  . b complex vitamins tablet Take 1 tablet by mouth daily.    . cyclobenzaprine (FLEXERIL) 10 MG tablet Take 1 tablet (10 mg total) by mouth at bedtime. 30 tablet 0  . esomeprazole (NEXIUM) 40 MG capsule TAKE 1 CAPSULE BY MOUTH DAILY BEFORE BREAKFAST. 90 capsule 0  . Ferrous Sulfate (IRON) 90 (18 Fe) MG TABS Take by mouth.    . Multiple Vitamin (MULTIVITAMIN) tablet Take 1 tablet by mouth daily.      . potassium chloride SA (KLOR-CON M20) 20 MEQ tablet Take 1 tablet (20 mEq total) by  mouth 3 (three) times daily. 90 tablet 3  . Probiotic Product (MISC INTESTINAL FLORA REGULAT) CHEW as needed. Digestive Advantage probiotics by Schiff    . SUMAtriptan (IMITREX) 50 MG tablet Take 1 tablet (50 mg total) by mouth every 2 (two) hours as needed for migraine or headache. May repeat in 2 hours if headache persists or recurs. 10 tablet 2  . triamterene-hydrochlorothiazide (MAXZIDE-25) 37.5-25 MG tablet Take 1 tablet by mouth daily. 90 tablet 2   No facility-administered medications prior to visit.     Allergies  Allergen Reactions  . Penicillins Rash  . Sulfa Antibiotics Rash    Review of Systems  Constitutional: Negative for chills, fever and malaise/fatigue.  HENT: Negative for congestion and hearing loss.   Eyes: Negative for  discharge.  Respiratory: Negative for cough, sputum production and shortness of breath.   Cardiovascular: Negative for chest pain, palpitations and leg swelling.  Gastrointestinal: Negative for abdominal pain, blood in stool, constipation, diarrhea, heartburn, nausea and vomiting.  Genitourinary: Negative for dysuria, frequency, hematuria and urgency.  Musculoskeletal: Negative for back pain, falls and myalgias.  Skin: Negative for rash.  Neurological: Negative for dizziness, sensory change, loss of consciousness, weakness and headaches.  Endo/Heme/Allergies: Negative for environmental allergies. Does not bruise/bleed easily.  Psychiatric/Behavioral: Negative for depression and suicidal ideas. The patient is not nervous/anxious and does not have insomnia.        Objective:    Physical Exam  Constitutional: She is oriented to person, place, and time. She appears well-developed and well-nourished. No distress.  HENT:  Head: Normocephalic and atraumatic.  Eyes: Conjunctivae are normal.  Neck: Neck supple. No thyromegaly present.  Cardiovascular: Normal rate, regular rhythm and normal heart sounds.   No murmur heard. Pulmonary/Chest: Effort normal and breath sounds normal. No respiratory distress.  Abdominal: Soft. Bowel sounds are normal. She exhibits no distension and no mass. There is no tenderness.  Musculoskeletal: She exhibits no edema.  Lymphadenopathy:    She has no cervical adenopathy.  Neurological: She is alert and oriented to person, place, and time.  Skin: Skin is warm and dry.  Psychiatric: She has a normal mood and affect. Her behavior is normal.    BP 108/60 (BP Location: Left Arm, Patient Position: Sitting, Cuff Size: Large)   Pulse 78   Temp 98.7 F (37.1 C) (Oral)   Ht 5\' 7"  (1.702 m)   Wt 233 lb (105.7 kg)   SpO2 96%   BMI 36.49 kg/m  Wt Readings from Last 3 Encounters:  05/19/16 233 lb (105.7 kg)  02/06/16 221 lb (100.2 kg)  09/27/15 234 lb (106.1 kg)      Lab Results  Component Value Date   WBC 7.0 05/19/2016   HGB 10.3 (L) 05/19/2016   HCT 31.4 (L) 05/19/2016   PLT 275.0 05/19/2016   GLUCOSE 89 05/19/2016   CHOL 174 09/27/2015   TRIG 87.0 09/27/2015   HDL 58.10 09/27/2015   LDLCALC 99 09/27/2015   ALT 15 05/19/2016   AST 15 05/19/2016   NA 137 05/19/2016   K 3.8 05/19/2016   CL 101 05/19/2016   CREATININE 0.91 05/19/2016   BUN 10 05/19/2016   CO2 30 05/19/2016   TSH 3.24 05/19/2016    Lab Results  Component Value Date   TSH 3.24 05/19/2016   Lab Results  Component Value Date   WBC 7.0 05/19/2016   HGB 10.3 (L) 05/19/2016   HCT 31.4 (L) 05/19/2016   MCV 76.0 (L) 05/19/2016  PLT 275.0 05/19/2016   Lab Results  Component Value Date   NA 137 05/19/2016   K 3.8 05/19/2016   CO2 30 05/19/2016   GLUCOSE 89 05/19/2016   BUN 10 05/19/2016   CREATININE 0.91 05/19/2016   BILITOT 0.3 05/19/2016   ALKPHOS 47 05/19/2016   AST 15 05/19/2016   ALT 15 05/19/2016   PROT 7.9 05/19/2016   ALBUMIN 4.1 05/19/2016   CALCIUM 9.2 05/19/2016   GFR 70.26 05/19/2016   Lab Results  Component Value Date   CHOL 174 09/27/2015   Lab Results  Component Value Date   HDL 58.10 09/27/2015   Lab Results  Component Value Date   LDLCALC 99 09/27/2015   Lab Results  Component Value Date   TRIG 87.0 09/27/2015   Lab Results  Component Value Date   CHOLHDL 3 09/27/2015   No results found for: HGBA1C     Assessment & Plan:   Problem List Items Addressed This Visit    GERD (gastroesophageal reflux disease)   Preventative health care    Patient encouraged to maintain heart healthy diet, regular exercise, adequate sleep. Consider daily probiotics. Take medications as prescribed. Given and reviewed copy of ACP documents from [employer] and encouraged to complete and return      Anemia    Increase leafy greens, consider increased lean red meat and using cast iron cookware. Continue to monitor, report any  concerns. Check stool for blood and take iron supplements.       Essential hypertension - Primary   Relevant Orders   Comprehensive metabolic panel (Completed)   TSH (Completed)   CBC (Completed)   Hyperlipidemia, mixed    Encouraged heart healthy diet, increase exercise, avoid trans fats, consider a krill oil cap daily       Other Visit Diagnoses   None.     I am having Ms. Berline Lopes maintain her multivitamin, Misc Intestinal Flora Regulat, esomeprazole, b complex vitamins, cyclobenzaprine, SUMAtriptan, Iron, potassium chloride SA, and triamterene-hydrochlorothiazide.  No orders of the defined types were placed in this encounter.    Penni Homans, MD

## 2016-05-24 NOTE — Assessment & Plan Note (Signed)
Increase leafy greens, consider increased lean red meat and using cast iron cookware. Continue to monitor, report any concerns. Check stool for blood and take iron supplements.

## 2016-05-28 ENCOUNTER — Ambulatory Visit: Payer: 59 | Admitting: Family Medicine

## 2016-06-15 ENCOUNTER — Other Ambulatory Visit: Payer: Self-pay | Admitting: Family Medicine

## 2016-06-15 MED ORDER — POTASSIUM CHLORIDE CRYS ER 20 MEQ PO TBCR: 20.0000 meq | EXTENDED_RELEASE_TABLET | Freq: Three times a day (TID) | ORAL | 2 refills | Status: DC

## 2016-06-23 ENCOUNTER — Other Ambulatory Visit (INDEPENDENT_AMBULATORY_CARE_PROVIDER_SITE_OTHER): Payer: 59

## 2016-06-23 DIAGNOSIS — K625 Hemorrhage of anus and rectum: Secondary | ICD-10-CM | POA: Diagnosis not present

## 2016-06-23 LAB — FECAL OCCULT BLOOD, IMMUNOCHEMICAL: FECAL OCCULT BLD: NEGATIVE

## 2016-07-16 ENCOUNTER — Other Ambulatory Visit: Payer: Self-pay | Admitting: Family Medicine

## 2016-08-07 LAB — HM COLONOSCOPY

## 2016-09-01 ENCOUNTER — Other Ambulatory Visit: Payer: Self-pay | Admitting: Family Medicine

## 2016-09-16 ENCOUNTER — Encounter (HOSPITAL_COMMUNITY): Admission: RE | Payer: Self-pay | Source: Ambulatory Visit

## 2016-09-16 ENCOUNTER — Ambulatory Visit (HOSPITAL_COMMUNITY): Admission: RE | Admit: 2016-09-16 | Payer: 59 | Source: Ambulatory Visit | Admitting: Gastroenterology

## 2016-09-16 SURGERY — IMAGING PROCEDURE, GI TRACT, INTRALUMINAL, VIA CAPSULE
Anesthesia: LOCAL

## 2016-12-08 ENCOUNTER — Other Ambulatory Visit: Payer: Self-pay | Admitting: Family Medicine

## 2016-12-13 ENCOUNTER — Other Ambulatory Visit: Payer: Self-pay | Admitting: Family Medicine

## 2017-02-05 ENCOUNTER — Other Ambulatory Visit: Payer: Self-pay | Admitting: Family Medicine

## 2017-02-24 ENCOUNTER — Other Ambulatory Visit (HOSPITAL_COMMUNITY): Payer: Self-pay | Admitting: Obstetrics and Gynecology

## 2017-02-24 HISTORY — PX: ABDOMINAL HYSTERECTOMY: SHX81

## 2017-02-24 NOTE — H&P (Signed)
46 y.o. yo complains of symptomatic fibroid uterus.   Previously:"US today for f/u on fibroids; Pt. was seen by urology for possible kidney stone, CT was WNL; also was seen by GI and had colo.-WNL in January; Pt. c/o intermittent dull/sharp LLQ pain which started a few months ago/ tb//\Pelvic 9x11x7; EM 0.6 cm; fibroids biggest 6 cm-left lateral; intramural fibroid; bilateral ovaries normal, normal blood flow and no ff.\fibroids bigger than 12-17, largest now grown from 4 to 6. Still having irreg bleeding with heavy- using overnight pads. On Iron."  Past Medical History:  Diagnosis Date  . Acute bronchitis 06/24/2012  . Acute bronchitis with asthma 06/24/2012  . Allergic state 10/15/2013  . Back pain 10/15/2013  . Chronic sinusitis 02/2012   current runny nose of clear drainage  . Dental crowns present   . GERD (gastroesophageal reflux disease)   . H/O gestational diabetes mellitus, not currently pregnant 07/29/2011   h/o   . Iron deficiency anemia   . Obese    Past Surgical History:  Procedure Laterality Date  . DILATION AND CURETTAGE OF UTERUS  1987  . NASAL SEPTOPLASTY W/ TURBINOPLASTY  03/23/2012   Procedure: NASAL SEPTOPLASTY WITH TURBINATE REDUCTION;  Surgeon: Jerrell Belfast, MD;  Location: [facility];  Service: ENT;  Laterality: Bilateral;  . SINUS ENDO W/FUSION  03/23/2012   Procedure: ENDOSCOPIC SINUS SURGERY WITH FUSION NAVIGATION;  Surgeon: Jerrell Belfast, MD;  Location: [facility];  Service: ENT;  Laterality: Bilateral;  with fusion scan    Social History   Social History  . Marital status: Married    Spouse name: N/A  . Number of children: N/A  . Years of education: N/A   Occupational History  . Not on file.   Social History Main Topics  . Smoking status: Former Smoker    Packs/day: 1.00    Years: 19.00    Quit date: 07/28/2003  . Smokeless tobacco: Never Used  . Alcohol use Yes     Comment: occasionally  . Drug use: No  . Sexual  activity: Yes    Partners: Male     Comment: lives with husband, stepson 29, son and daughter. no dietary restrictions. eating heart healthy, walking more   Other Topics Concern  . Not on file   Social History Narrative  . No narrative on file    No current facility-administered medications on file prior to encounter.    Current Outpatient Prescriptions on File Prior to Encounter  Medication Sig Dispense Refill  . b complex vitamins tablet Take 1 tablet by mouth daily.    . cyclobenzaprine (FLEXERIL) 10 MG tablet Take 1 tablet (10 mg total) by mouth at bedtime. 30 tablet 0  . esomeprazole (NEXIUM) 40 MG capsule TAKE 1 CAPSULE BY MOUTH DAILY BEFORE BREAKFAST. 90 capsule 0  . Ferrous Sulfate (IRON) 90 (18 Fe) MG TABS Take by mouth.    Marland Kitchen KLOR-CON M20 20 MEQ tablet TAKE 1 TABLET (20 MEQ TOTAL) BY MOUTH 3 (THREE) TIMES DAILY. 90 tablet 0  . KLOR-CON M20 20 MEQ tablet TAKE 1 TABLET (20 MEQ TOTAL) BY MOUTH 3 (THREE) TIMES DAILY. 90 tablet 0  . Multiple Vitamin (MULTIVITAMIN) tablet Take 1 tablet by mouth daily.      . potassium chloride SA (KLOR-CON M20) 20 MEQ tablet Take 1 tablet (20 mEq total) by mouth 3 (three) times daily. 270 tablet 2  . Probiotic Product (MISC INTESTINAL FLORA REGULAT) CHEW as needed. Digestive Advantage probiotics by Schiff    .  SUMAtriptan (IMITREX) 50 MG tablet Take 1 tablet (50 mg total) by mouth every 2 (two) hours as needed for migraine or headache. May repeat in 2 hours if headache persists or recurs. 10 tablet 2  . triamterene-hydrochlorothiazide (MAXZIDE-25) 37.5-25 MG tablet TAKE 1 TABLET BY MOUTH DAILY. 90 tablet 2    Allergies  Allergen Reactions  . Penicillins Rash  . Sulfa Antibiotics Rash    @VITALS2 @  Lungs: clear to ascultation Cor:  RRR Abdomen:  soft, nontender, nondistended. Ex:  no cords, erythema Pelvic:   Vulva: no masses, no atrophy, no lesions\ls1   Vagina: no tenderness, no erythema, no abnormal vaginal discharge, no vesicle(s) or  ulcers, no cystocele, no rectocele\ls1   Cervix: grossly normal, no discharge, no cervical motion tenderness\ls1  Uterus: enlarged size (10), normal shape, midline, no uterine prolapse, non-tender\ls1 Bladder/Urethra: normal meatus, no urethral discharge, no urethral mass, bladder non distended, Urethra well supported\ls1   Adnexa/Parametria: no parametrial tenderness, no parametrial mass, no adnexal tenderness, no ovarian mass  A:  For robotic TLH/salpingectomies, cysto. Possible BSO.   P: All risks, benefits and alternatives d/w patient and she desires to proceed.  Patient has undergone a modified bowel prep and will receive preop antibiotics and SCDs during the operation.     Ezrah Dembeck A

## 2017-03-05 ENCOUNTER — Encounter (HOSPITAL_COMMUNITY): Payer: Self-pay

## 2017-03-05 ENCOUNTER — Other Ambulatory Visit: Payer: Self-pay

## 2017-03-05 ENCOUNTER — Encounter (HOSPITAL_COMMUNITY)
Admission: RE | Admit: 2017-03-05 | Discharge: 2017-03-05 | Disposition: A | Discharge status: Home / Self Care | Payer: 59 | Source: Ambulatory Visit | Attending: Obstetrics and Gynecology | Admitting: Obstetrics and Gynecology

## 2017-03-05 DIAGNOSIS — D259 Leiomyoma of uterus, unspecified: Secondary | ICD-10-CM | POA: Insufficient documentation

## 2017-03-05 DIAGNOSIS — Z01818 Encounter for other preprocedural examination: Secondary | ICD-10-CM | POA: Diagnosis not present

## 2017-03-05 DIAGNOSIS — Z0183 Encounter for blood typing: Secondary | ICD-10-CM | POA: Diagnosis not present

## 2017-03-05 DIAGNOSIS — Z01812 Encounter for preprocedural laboratory examination: Secondary | ICD-10-CM | POA: Insufficient documentation

## 2017-03-05 HISTORY — DX: Headache: R51

## 2017-03-05 HISTORY — DX: Personal history of other medical treatment: Z92.89

## 2017-03-05 HISTORY — DX: Headache, unspecified: R51.9

## 2017-03-05 HISTORY — DX: Essential (primary) hypertension: I10

## 2017-03-05 LAB — CBC
HCT: 36.7 % (ref 36.0–46.0)
HEMOGLOBIN: 12.3 g/dL (ref 12.0–15.0)
MCH: 29.8 pg (ref 26.0–34.0)
MCHC: 33.5 g/dL (ref 30.0–36.0)
MCV: 88.9 fL (ref 78.0–100.0)
Platelets: 231 10*3/uL (ref 150–400)
RBC: 4.13 MIL/uL (ref 3.87–5.11)
RDW: 13.7 % (ref 11.5–15.5)
WBC: 6.4 10*3/uL (ref 4.0–10.5)

## 2017-03-05 LAB — TYPE AND SCREEN
ABO/RH(D): A POS
ANTIBODY SCREEN: NEGATIVE

## 2017-03-05 LAB — ABO/RH: ABO/RH(D): A POS

## 2017-03-05 NOTE — Patient Instructions (Signed)
Your procedure is scheduled on:  Wednesday, Aug. 22, 2018  Enter through the [employer] [facility] at:  10:30 AM  Pick up the phone at the desk and dial [phone].  Call this number if you have problems the morning of surgery: [phone].  Remember: Do NOT eat food:  After Midnight Tuesday  Do NOT drink clear liquids after:  6:00 AM Wednesday  Take these medicines the morning of surgery with a SIP OF WATER:  Nexium, Klor-Con, Triamterene  Stop ALL herbal medications at this time  Do NOT smoke the day of surgery.  Do NOT wear jewelry (body piercing), metal hair clips/bobby pins, make-up, artifical eyelashes or nail polish. Do NOT wear lotions, powders, or perfumes.  You may wear deodorant. Do NOT shave for 48 hours prior to surgery. Do NOT bring valuables to the hospital. Contacts, dentures, or bridgework may not be worn into surgery.  Leave suitcase in car.  After surgery it may be brought to your room.  For patients admitted to the hospital, checkout time is 11:00 AM the day of discharge.  Bring a copy of your healthcare power of attorney and living will documents.

## 2017-03-17 ENCOUNTER — Ambulatory Visit (HOSPITAL_COMMUNITY): Payer: 59 | Admitting: Anesthesiology

## 2017-03-17 ENCOUNTER — Encounter (HOSPITAL_COMMUNITY)
Admission: RE | Disposition: A | Discharge status: Home / Self Care | Payer: Self-pay | Source: Ambulatory Visit | Attending: Obstetrics and Gynecology

## 2017-03-17 ENCOUNTER — Encounter (HOSPITAL_COMMUNITY): Payer: Self-pay | Admitting: Anesthesiology

## 2017-03-17 ENCOUNTER — Observation Stay (HOSPITAL_COMMUNITY)
Admission: RE | Admit: 2017-03-17 | Discharge: 2017-03-18 | Disposition: A | Discharge status: Home / Self Care | Payer: 59 | Source: Ambulatory Visit | Attending: Obstetrics and Gynecology | Admitting: Obstetrics and Gynecology

## 2017-03-17 DIAGNOSIS — D259 Leiomyoma of uterus, unspecified: Secondary | ICD-10-CM | POA: Diagnosis not present

## 2017-03-17 DIAGNOSIS — Z79899 Other long term (current) drug therapy: Secondary | ICD-10-CM | POA: Insufficient documentation

## 2017-03-17 DIAGNOSIS — K219 Gastro-esophageal reflux disease without esophagitis: Secondary | ICD-10-CM | POA: Insufficient documentation

## 2017-03-17 DIAGNOSIS — I10 Essential (primary) hypertension: Secondary | ICD-10-CM | POA: Diagnosis not present

## 2017-03-17 DIAGNOSIS — J329 Chronic sinusitis, unspecified: Secondary | ICD-10-CM | POA: Insufficient documentation

## 2017-03-17 DIAGNOSIS — Z880 Allergy status to penicillin: Secondary | ICD-10-CM | POA: Diagnosis not present

## 2017-03-17 DIAGNOSIS — Z882 Allergy status to sulfonamides status: Secondary | ICD-10-CM | POA: Diagnosis not present

## 2017-03-17 DIAGNOSIS — D509 Iron deficiency anemia, unspecified: Secondary | ICD-10-CM | POA: Diagnosis not present

## 2017-03-17 DIAGNOSIS — E669 Obesity, unspecified: Secondary | ICD-10-CM | POA: Diagnosis not present

## 2017-03-17 DIAGNOSIS — Z6834 Body mass index (BMI) 34.0-34.9, adult: Secondary | ICD-10-CM | POA: Diagnosis not present

## 2017-03-17 DIAGNOSIS — Z8632 Personal history of gestational diabetes: Secondary | ICD-10-CM | POA: Diagnosis not present

## 2017-03-17 DIAGNOSIS — Z9889 Other specified postprocedural states: Secondary | ICD-10-CM

## 2017-03-17 DIAGNOSIS — N838 Other noninflammatory disorders of ovary, fallopian tube and broad ligament: Secondary | ICD-10-CM | POA: Insufficient documentation

## 2017-03-17 DIAGNOSIS — Z87891 Personal history of nicotine dependence: Secondary | ICD-10-CM | POA: Insufficient documentation

## 2017-03-17 HISTORY — PX: ROBOTIC ASSISTED TOTAL HYSTERECTOMY WITH SALPINGECTOMY: SHX6679

## 2017-03-17 HISTORY — DX: Other specified postprocedural states: Z98.890

## 2017-03-17 LAB — PREGNANCY, URINE: Preg Test, Ur: NEGATIVE

## 2017-03-17 SURGERY — ROBOTIC ASSISTED TOTAL HYSTERECTOMY WITH SALPINGECTOMY
Anesthesia: General | Site: Abdomen

## 2017-03-17 MED ORDER — ONDANSETRON HCL 4 MG PO TABS: 4.0000 mg | ORAL_TABLET | Freq: Four times a day (QID) | ORAL | Status: DC | PRN

## 2017-03-17 MED ORDER — IBUPROFEN 800 MG PO TABS
800.0000 mg | ORAL_TABLET | Freq: Three times a day (TID) | ORAL | Status: DC | PRN
  Administered 2017-03-18 (×2): 800 mg via ORAL
  Filled 2017-03-17 (×2): qty 1

## 2017-03-17 MED ORDER — POTASSIUM CHLORIDE CRYS ER 20 MEQ PO TBCR: 20.0000 meq | EXTENDED_RELEASE_TABLET | Freq: Every day | ORAL | Status: DC

## 2017-03-17 MED ORDER — KETOROLAC TROMETHAMINE 30 MG/ML IJ SOLN
INTRAMUSCULAR | Status: DC | PRN
  Administered 2017-03-17: 30 mg via INTRAVENOUS

## 2017-03-17 MED ORDER — PROMETHAZINE HCL 25 MG/ML IJ SOLN: 6.2500 mg | INTRAMUSCULAR | Status: DC | PRN

## 2017-03-17 MED ORDER — KETOROLAC TROMETHAMINE 30 MG/ML IJ SOLN
INTRAMUSCULAR | Status: AC
  Filled 2017-03-17: qty 1

## 2017-03-17 MED ORDER — TRIAMTERENE-HCTZ 37.5-25 MG PO TABS
1.0000 | ORAL_TABLET | Freq: Every day | ORAL | Status: DC
  Filled 2017-03-17: qty 1

## 2017-03-17 MED ORDER — LACTATED RINGERS IV SOLN
INTRAVENOUS | Status: DC
  Administered 2017-03-17: 1000 mL via INTRAVENOUS
  Administered 2017-03-17: 13:00:00 via INTRAVENOUS

## 2017-03-17 MED ORDER — ROPIVACAINE HCL 5 MG/ML IJ SOLN
INTRAMUSCULAR | Status: AC
  Filled 2017-03-17: qty 30

## 2017-03-17 MED ORDER — MENTHOL 3 MG MT LOZG: 1.0000 | LOZENGE | OROMUCOSAL | Status: DC | PRN

## 2017-03-17 MED ORDER — PANTOPRAZOLE SODIUM 40 MG PO TBEC: 80.0000 mg | DELAYED_RELEASE_TABLET | Freq: Every day | ORAL | Status: DC

## 2017-03-17 MED ORDER — STERILE WATER FOR IRRIGATION IR SOLN
Status: DC | PRN
  Administered 2017-03-17: 1000 mL via INTRAVESICAL

## 2017-03-17 MED ORDER — LIDOCAINE HCL (CARDIAC) 20 MG/ML IV SOLN
INTRAVENOUS | Status: DC | PRN
  Administered 2017-03-17: 30 mg via INTRAVENOUS

## 2017-03-17 MED ORDER — MEPERIDINE HCL 25 MG/ML IJ SOLN: 6.2500 mg | INTRAMUSCULAR | Status: DC | PRN

## 2017-03-17 MED ORDER — HYDROMORPHONE HCL 1 MG/ML IJ SOLN: 0.2500 mg | INTRAMUSCULAR | Status: DC | PRN

## 2017-03-17 MED ORDER — LACTATED RINGERS IV SOLN: INTRAVENOUS | Status: DC

## 2017-03-17 MED ORDER — FENTANYL CITRATE (PF) 250 MCG/5ML IJ SOLN
INTRAMUSCULAR | Status: AC
Start: 2017-03-17 — End: 2017-03-17
  Filled 2017-03-17: qty 5

## 2017-03-17 MED ORDER — KETOROLAC TROMETHAMINE 30 MG/ML IJ SOLN: 30.0000 mg | Freq: Four times a day (QID) | INTRAMUSCULAR | Status: DC

## 2017-03-17 MED ORDER — PROPOFOL 10 MG/ML IV BOLUS
INTRAVENOUS | Status: AC
  Filled 2017-03-17: qty 20

## 2017-03-17 MED ORDER — FENTANYL CITRATE (PF) 100 MCG/2ML IJ SOLN
INTRAMUSCULAR | Status: DC | PRN
  Administered 2017-03-17 (×4): 50 ug via INTRAVENOUS
  Administered 2017-03-17: 100 ug via INTRAVENOUS
  Administered 2017-03-17: 50 ug via INTRAVENOUS

## 2017-03-17 MED ORDER — ARTIFICIAL TEARS OPHTHALMIC OINT
TOPICAL_OINTMENT | OPHTHALMIC | Status: AC
  Filled 2017-03-17: qty 3.5

## 2017-03-17 MED ORDER — CLINDAMYCIN PHOSPHATE 900 MG/50ML IV SOLN
900.0000 mg | INTRAVENOUS | Status: AC
  Administered 2017-03-17: 900 mg via INTRAVENOUS
  Filled 2017-03-17: qty 50

## 2017-03-17 MED ORDER — SCOPOLAMINE 1 MG/3DAYS TD PT72
1.0000 | MEDICATED_PATCH | Freq: Once | TRANSDERMAL | Status: DC
  Administered 2017-03-17: 1.5 mg via TRANSDERMAL

## 2017-03-17 MED ORDER — SODIUM CHLORIDE 0.9 % IR SOLN
Status: DC | PRN
  Administered 2017-03-17: 3000 mL

## 2017-03-17 MED ORDER — MIDAZOLAM HCL 2 MG/2ML IJ SOLN
INTRAMUSCULAR | Status: AC
  Filled 2017-03-17: qty 2

## 2017-03-17 MED ORDER — DEXAMETHASONE SODIUM PHOSPHATE 10 MG/ML IJ SOLN
INTRAMUSCULAR | Status: DC | PRN
  Administered 2017-03-17: 4 mg via INTRAVENOUS

## 2017-03-17 MED ORDER — SUGAMMADEX SODIUM 500 MG/5ML IV SOLN
INTRAVENOUS | Status: DC | PRN
  Administered 2017-03-17: 211.8 mg via INTRAVENOUS

## 2017-03-17 MED ORDER — ONDANSETRON HCL 4 MG/2ML IJ SOLN
INTRAMUSCULAR | Status: AC
  Filled 2017-03-17: qty 2

## 2017-03-17 MED ORDER — POTASSIUM CHLORIDE CRYS ER 20 MEQ PO TBCR
20.0000 meq | EXTENDED_RELEASE_TABLET | Freq: Two times a day (BID) | ORAL | Status: DC
  Administered 2017-03-17: 20 meq via ORAL
  Filled 2017-03-17 (×3): qty 2

## 2017-03-17 MED ORDER — DEXAMETHASONE SODIUM PHOSPHATE 4 MG/ML IJ SOLN
INTRAMUSCULAR | Status: AC
  Filled 2017-03-17: qty 1

## 2017-03-17 MED ORDER — ROCURONIUM BROMIDE 100 MG/10ML IV SOLN
INTRAVENOUS | Status: DC | PRN
  Administered 2017-03-17: 50 mg via INTRAVENOUS
  Administered 2017-03-17: 20 mg via INTRAVENOUS

## 2017-03-17 MED ORDER — POTASSIUM CHLORIDE CRYS ER 20 MEQ PO TBCR
40.0000 meq | EXTENDED_RELEASE_TABLET | Freq: Every day | ORAL | Status: DC
  Filled 2017-03-17: qty 2

## 2017-03-17 MED ORDER — ROCURONIUM BROMIDE 100 MG/10ML IV SOLN
INTRAVENOUS | Status: AC
  Filled 2017-03-17: qty 1

## 2017-03-17 MED ORDER — SODIUM CHLORIDE 0.9 % IJ SOLN
INTRAMUSCULAR | Status: AC
  Filled 2017-03-17: qty 50

## 2017-03-17 MED ORDER — KETOROLAC TROMETHAMINE 30 MG/ML IJ SOLN: 30.0000 mg | Freq: Once | INTRAMUSCULAR | Status: DC | PRN

## 2017-03-17 MED ORDER — ONDANSETRON HCL 4 MG/2ML IJ SOLN
4.0000 mg | Freq: Four times a day (QID) | INTRAMUSCULAR | Status: DC | PRN
  Administered 2017-03-17: 4 mg via INTRAVENOUS
  Filled 2017-03-17: qty 2

## 2017-03-17 MED ORDER — MIDAZOLAM HCL 2 MG/2ML IJ SOLN
INTRAMUSCULAR | Status: DC | PRN
Start: 2017-03-17 — End: 2017-03-17
  Administered 2017-03-17: 1 mg via INTRAVENOUS

## 2017-03-17 MED ORDER — PROPOFOL 10 MG/ML IV BOLUS
INTRAVENOUS | Status: DC | PRN
  Administered 2017-03-17: 200 mg via INTRAVENOUS

## 2017-03-17 MED ORDER — FENTANYL CITRATE (PF) 100 MCG/2ML IJ SOLN
INTRAMUSCULAR | Status: AC
  Filled 2017-03-17: qty 2

## 2017-03-17 MED ORDER — SCOPOLAMINE 1 MG/3DAYS TD PT72
MEDICATED_PATCH | TRANSDERMAL | Status: AC
  Filled 2017-03-17: qty 1

## 2017-03-17 MED ORDER — OXYCODONE-ACETAMINOPHEN 5-325 MG PO TABS
1.0000 | ORAL_TABLET | ORAL | Status: DC | PRN
  Administered 2017-03-17: 2 via ORAL
  Administered 2017-03-17 (×2): 1 via ORAL
  Administered 2017-03-18: 2 via ORAL
  Administered 2017-03-18: 1 via ORAL
  Filled 2017-03-17: qty 2
  Filled 2017-03-17: qty 1
  Filled 2017-03-17: qty 2
  Filled 2017-03-17 (×2): qty 1

## 2017-03-17 MED ORDER — ROPIVACAINE HCL 5 MG/ML IJ SOLN
INTRAMUSCULAR | Status: DC | PRN
  Administered 2017-03-17: 60 mL

## 2017-03-17 MED ORDER — ONDANSETRON HCL 4 MG/2ML IJ SOLN
INTRAMUSCULAR | Status: DC | PRN
  Administered 2017-03-17: 4 mg via INTRAVENOUS

## 2017-03-17 MED ORDER — CIPROFLOXACIN IN D5W 400 MG/200ML IV SOLN
400.0000 mg | INTRAVENOUS | Status: AC
  Administered 2017-03-17: 400 mg via INTRAVENOUS
  Filled 2017-03-17: qty 200

## 2017-03-17 SURGICAL SUPPLY — 56 items
BARRIER ADHS 3X4 INTERCEED (GAUZE/BANDAGES/DRESSINGS) IMPLANT
CATH FOLEY 2WAY SLVR  5CC 16FR (CATHETERS) ×2
CATH FOLEY 2WAY SLVR 5CC 16FR (CATHETERS) ×1 IMPLANT
CLOTH BEACON ORANGE TIMEOUT ST (SAFETY) ×3 IMPLANT
CONT PATH 16OZ SNAP LID 3702 (MISCELLANEOUS) ×3 IMPLANT
COVER BACK TABLE 60X90IN (DRAPES) ×6 IMPLANT
COVER TIP SHEARS 8 DVNC (MISCELLANEOUS) ×1 IMPLANT
COVER TIP SHEARS 8MM DA VINCI (MISCELLANEOUS) ×2
DECANTER SPIKE VIAL GLASS SM (MISCELLANEOUS) ×6 IMPLANT
DERMABOND ADVANCED (GAUZE/BANDAGES/DRESSINGS) ×2
DERMABOND ADVANCED .7 DNX12 (GAUZE/BANDAGES/DRESSINGS) ×1 IMPLANT
DURAPREP 26ML APPLICATOR (WOUND CARE) ×3 IMPLANT
ELECT REM PT RETURN 9FT ADLT (ELECTROSURGICAL) ×3
ELECTRODE REM PT RTRN 9FT ADLT (ELECTROSURGICAL) ×1 IMPLANT
GAUZE VASELINE 3X9 (GAUZE/BANDAGES/DRESSINGS) IMPLANT
GLOVE BIO SURGEON STRL SZ7 (GLOVE) ×9 IMPLANT
GLOVE BIOGEL PI IND STRL 7.0 (GLOVE) ×2 IMPLANT
GLOVE BIOGEL PI INDICATOR 7.0 (GLOVE) ×4
GRASPER BIPOLAR FEN DA VINCI (INSTRUMENTS)
GRASPER BPLR FEN DVNC (INSTRUMENTS) IMPLANT
KIT ACCESSORY DA VINCI DISP (KITS) ×2
KIT ACCESSORY DVNC DISP (KITS) ×1 IMPLANT
LEGGING LITHOTOMY PAIR STRL (DRAPES) ×3 IMPLANT
MANIPULATOR ADVINCU DEL 2.5 PL (MISCELLANEOUS) IMPLANT
MANIPULATOR ADVINCU DEL 3.0 PL (MISCELLANEOUS) ×3 IMPLANT
MANIPULATOR ADVINCU DEL 3.5 PL (MISCELLANEOUS) IMPLANT
MANIPULATOR ADVINCU DEL 4.0 PL (MISCELLANEOUS) IMPLANT
NEEDLE INSUFFLATION 120MM (ENDOMECHANICALS) ×3 IMPLANT
PACK ROBOT WH (CUSTOM PROCEDURE TRAY) ×3 IMPLANT
PACK ROBOTIC GOWN (GOWN DISPOSABLE) ×3 IMPLANT
PACK TRENDGUARD 450 HYBRID PRO (MISCELLANEOUS) ×1 IMPLANT
PACK TRENDGUARD 600 HYBRD PROC (MISCELLANEOUS) IMPLANT
PAD PREP 24X48 CUFFED NSTRL (MISCELLANEOUS) ×3 IMPLANT
POUCH LAPAROSCOPIC INSTRUMENT (MISCELLANEOUS) ×3 IMPLANT
PROTECTOR NERVE ULNAR (MISCELLANEOUS) ×6 IMPLANT
SET CYSTO W/LG BORE CLAMP LF (SET/KITS/TRAYS/PACK) ×3 IMPLANT
SET IRRIG TUBING LAPAROSCOPIC (IRRIGATION / IRRIGATOR) ×3 IMPLANT
SET TRI-LUMEN FLTR TB AIRSEAL (TUBING) ×3 IMPLANT
SUT DVC VLOC 180 0 12IN GS21 (SUTURE)
SUT VIC AB 2-0 CT2 27 (SUTURE) ×6 IMPLANT
SUT VIC AB 2-0 UR6 27 (SUTURE) ×3 IMPLANT
SUT VICRYL RAPIDE 3 0 (SUTURE) ×6 IMPLANT
SUT VLOC 180 0 9IN  GS21 (SUTURE) ×2
SUT VLOC 180 0 9IN GS21 (SUTURE) ×1 IMPLANT
SUTURE DVC VLC 180 0 12IN GS21 (SUTURE) IMPLANT
SYR 50ML LL SCALE MARK (SYRINGE) ×3 IMPLANT
TIP RUMI ORANGE 6.7MMX12CM (TIP) IMPLANT
TOWEL OR 17X24 6PK STRL BLUE (TOWEL DISPOSABLE) ×9 IMPLANT
TRENDGUARD 450 HYBRID PRO PACK (MISCELLANEOUS) ×3
TRENDGUARD 600 HYBRID PROC PK (MISCELLANEOUS)
TROCAR DILATING TIP 12MM 150MM (ENDOMECHANICALS) ×3 IMPLANT
TROCAR DISP BLADELESS 8 DVNC (TROCAR) ×1 IMPLANT
TROCAR DISP BLADELESS 8MM (TROCAR) ×2
TROCAR PORT AIRSEAL 5X120 (TROCAR) ×3 IMPLANT
TROCAR PORT AIRSEAL 8X120 (TROCAR) IMPLANT
WATER STERILE IRR 1000ML POUR (IV SOLUTION) ×3 IMPLANT

## 2017-03-17 NOTE — Anesthesia Procedure Notes (Signed)
Procedure Name: Intubation Date/Time: 03/17/2017 10:56 AM Performed by: Tobin Chad Pre-anesthesia Checklist: Patient identified, Emergency Drugs available, Suction available and Patient being monitored Patient Re-evaluated:Patient Re-evaluated prior to induction Oxygen Delivery Method: Circle system utilized and Simple face mask Preoxygenation: Pre-oxygenation with 100% oxygen Induction Type: IV induction and Inhalational induction Ventilation: Mask ventilation without difficulty Laryngoscope Size: Mac and 3 Grade View: Grade II Tube type: Oral Tube size: 7.0 mm Number of attempts: 1 Airway Equipment and Method: Stylet Placement Confirmation: ETT inserted through vocal cords under direct vision,  positive ETCO2 and breath sounds checked- equal and bilateral Secured at: 22 cm Tube secured with: Tape Dental Injury: Teeth and Oropharynx as per pre-operative assessment

## 2017-03-17 NOTE — Anesthesia Preprocedure Evaluation (Signed)
Anesthesia Evaluation  Patient identified by MRN, date of birth, ID band Patient awake    Reviewed: Allergy & Precautions, H&P , NPO status , Patient's Chart, lab work & pertinent test results  History of Anesthesia Complications Negative for: history of anesthetic complications  Airway Mallampati: II  TM Distance: >3 FB Neck ROM: Full    Dental no notable dental hx. (+) Teeth Intact   Pulmonary former smoker,    Pulmonary exam normal breath sounds clear to auscultation       Cardiovascular hypertension, Pt. on medications Normal cardiovascular exam Rhythm:Regular Rate:Normal     Neuro/Psych    GI/Hepatic Neg liver ROS, GERD  Medicated and Controlled,  Endo/Other  neg diabetesMorbid obesity  Renal/GU negative Renal ROS     Musculoskeletal   Abdominal (+) + obese,   Peds  Hematology   Anesthesia Other Findings   Reproductive/Obstetrics LMP presently                             Anesthesia Physical  Anesthesia Plan  ASA: II  Anesthesia Plan: General   Post-op Pain Management:    Induction: Intravenous  PONV Risk Score and Plan: 4 or greater and Ondansetron, Dexamethasone, Midazolam, Scopolamine patch - Pre-op and Propofol infusion  Airway Management Planned: Oral ETT  Additional Equipment:   Intra-op Plan:   Post-operative Plan: Extubation in OR  Informed Consent: I have reviewed the patients History and Physical, chart, labs and discussed the procedure including the risks, benefits and alternatives for the proposed anesthesia with the patient or authorized representative who has indicated his/her understanding and acceptance.   Dental advisory given  Plan Discussed with: CRNA and Surgeon  Anesthesia Plan Comments: (Plan routine monitors, GETA)        Anesthesia Quick Evaluation

## 2017-03-17 NOTE — Anesthesia Postprocedure Evaluation (Signed)
Anesthesia Post Note  Patient: Connie Blanchard  Procedure(s) Performed: Procedure(s) (LRB): ROBOTIC ASSISTED TOTAL HYSTERECTOMY WITH SALPINGECTOMY (N/A)     Patient location during evaluation: PACU Anesthesia Type: General Level of consciousness: awake and alert and oriented Pain management: pain level controlled Vital Signs Assessment: post-procedure vital signs reviewed and stable Respiratory status: spontaneous breathing, nonlabored ventilation and respiratory function stable Cardiovascular status: blood pressure returned to baseline and stable Postop Assessment: no signs of nausea or vomiting Anesthetic complications: no    Last Vitals:  Vitals:   03/17/17 1345 03/17/17 1400  BP: 127/72 113/74  Pulse: 71 72  Resp: 10 11  Temp:    SpO2: 96% 95%    Last Pain:  Vitals:   03/17/17 1400  TempSrc:   PainSc: 3    Pain Goal: Patients Stated Pain Goal: 4 (03/17/17 0929)               Daniel Johndrow A.

## 2017-03-17 NOTE — Progress Notes (Signed)
There has been no change in the patients history, status or exam since the history and physical.  Vitals:   03/17/17 0929  BP: 138/85  Pulse: 81  Resp: 16  Temp: 98.2 F (36.8 C)  TempSrc: Oral  SpO2: 99%    Lab Results  Component Value Date   WBC 6.4 03/05/2017   HGB 12.3 03/05/2017   HCT 36.7 03/05/2017   MCV 88.9 03/05/2017   PLT 231 03/05/2017   Results for orders placed or performed during the hospital encounter of 03/17/17 (from the past 24 hour(s))  Pregnancy, urine     Status: None   Collection Time: 03/17/17  9:00 AM  Result Value Ref Range   Preg Test, Ur NEGATIVE NEGATIVE    Connie Blanchard A

## 2017-03-17 NOTE — Brief Op Note (Signed)
03/17/2017  12:49 PM  PATIENT:  Connie Blanchard  46 y.o. female  PRE-OPERATIVE DIAGNOSIS:  FIBROIDS  POST-OPERATIVE DIAGNOSIS:  FIBROIDS  PROCEDURE:  Procedure(s): ROBOTIC ASSISTED TOTAL HYSTERECTOMY WITH SALPINGECTOMY (N/A)  SURGEON:  Surgeon(s) and Role:    * Bobbye Charleston, MD - Primary    * Jerelyn Charles, MD - Assisting  ANESTHESIA:   general  EBL:  Total I/O In: 1000 [I.V.:1000] Out: 400 [Urine:250; Blood:150]  LOCAL MEDICATIONS USED:  OTHER Ropivicaine  SPECIMEN:  Source of Specimen:  uterus, fibroid, cervix, bilateral tubes  DISPOSITION OF SPECIMEN:  PATHOLOGY  COUNTS:  YES  TOURNIQUET:  * No tourniquets in log *  DICTATION: .Note written in EPIC  PLAN OF CARE: Admit for overnight observation  PATIENT DISPOSITION:  PACU - hemodynamically stable.   Delay start of Pharmacological VTE agent (>24hrs) due to surgical blood loss or risk of bleeding: not applicable  Complications:  None.  Findings:  16 weeks size uterus with 12 cm fibroid extending into the  L broad ligament.  Ovaries were normal appearing. The ureters were identified during multiple points of the case and were always out of the field of dissection.  On cystoscopy, the bladder was intact and bilateral spill was seen from each ureteral orriface.    Medications:  Ancef.  Bupivicaine.    Technique:  After adequate anesthesia was achieved the patient was positioned, prepped and draped in usual sterile fashion.  A speculum was placed in the vagina and the cervix dilated with pratt dilators.  The Advincula with the  3 cm Koh ring were assembled and placed in proper fashion.  The  Speculum was removed and the bladder catheterized with a foley.    Attention was turned to the abdomen where a 1 cm incision was made 1 cm above the umbilicus.  The veress needle was introduced without aspiration of bowel contents or blood and the abdomen insufflated. The long 12 mm trocar was placed and the other three  trocar sites were marked out, all approximately 10 cm from each other and the camera.  Two 8.5 mm trocars were placed on either side of the camera port and a 5 mm assistant port was placed 3 cm above the L iliac crest.  All trocars were inserted under direct visualization of the camera.  The patient was placed in trendelenburg and then the Robot docked.  The PK forceps were placed on arm 2 and the Hot shears on arm 1 and introduced under direct visualization of the camera.  I then broke scrub and sat down at the console.  The above findings were noted and the ureters identified well out of the field of dissection.  The right fallopian tube was isolated and cauterized with the PK.  The Utero-ovarian ligament was then divided with the PK cautery and shears.  The posterior broad ligament was then divided with the hot shears until the uterosacral ligament.  The Broad and cardinal ligaments were then cauterized against the cervix to the level of the Koh ring, securing the uterine artery.  Each pedicle was then incised with the shears.  The anterior leaf was then incised at the reflection of the vessico-uterine junction and the lateral bladder retracted inferiorly after the round ligament had been divided with the PK forceps.  The left tube was cauterized with the PK and divided with the shears;  then the left utero-ovarian ligament divided with the PK forceps and the scissors.  The round ligament was divided as  well and the posterior leaf of the broad ligament then divided with the hot shears. The broad and cardinal ligaments were then cauterized on the left in the same way.   At the level of the internal os, the uterine arteries were bilaterally cauterized with the PK.  The ureters were identified well out of the field of dissection.  .   The bladder was then able to be retracted inferiorly and the vesico-uterine fascia was incised in the midline until the bladder was removed one cm below the Koh ring.  The hot  shears then circumferentially incised the vagina at the level of the reflection on the [facility] ring.  Once the uterus and cervix were amputated, cautery was used to insure hemostasis of the cuff.  The uterus was unable to fit with the fibroid through the top of the vagina so the fibroid was removed with blunt and sharp dissection with the hot shears until a small pedicle remained- thus the uterus and fibroid and tubes were removed through the vagina. Once hemostasis was achieved, the instruments were changed to the mega needle driver and mega suture cut needle driver and the cuff was closed with a running stitches of 0-vicryl V loc.  Cautery was used to ensure hemostasis of the left pedicles very superficially.  The ureters were peristalsing bilaterally well and very lateral to the areas of operation.    The Robot was then undocked and I scrubbed back in.  The needle was removed and Bupivicaine was introduced into the pelvis.  The fascia of the 12 mm trocar was closed with a figure of 8 stitch of 0 vicryl.  The skin incisions were closed with subcuticular stitches of 3-0 vicryl Rapide and Dermabond.  All instruments were removed from the vagina and cystoscopy performed, revealing an intact bladder and vigourous spill of indigo carmine from each ureteral orifice.  The cystoscope was removed and the patient taken to the recovery room in stable condition.  Jaquann Guarisco A

## 2017-03-17 NOTE — Transfer of Care (Signed)
Immediate Anesthesia Transfer of Care Note  Patient: Connie Blanchard  Procedure(s) Performed: Procedure(s): ROBOTIC ASSISTED TOTAL HYSTERECTOMY WITH SALPINGECTOMY (N/A)  Patient Location: PACU  Anesthesia Type:General  Level of Consciousness: awake, alert  and oriented  Airway & Oxygen Therapy: Patient Spontanous Breathing and Patient connected to nasal cannula oxygen  Post-op Assessment: Report given to RN and Post -op Vital signs reviewed and stable  Post vital signs: Reviewed and stable  Last Vitals:  Vitals:   03/17/17 0929  BP: 138/85  Pulse: 81  Resp: 16  Temp: 36.8 C  SpO2: 99%    Last Pain:  Vitals:   03/17/17 0929  TempSrc: Oral  PainSc: 0-No pain      Patients Stated Pain Goal: 4 (01/56/15 3794)  Complications: No apparent anesthesia complications

## 2017-03-17 NOTE — Op Note (Signed)
03/17/2017  12:49 PM  PATIENT:  Connie Blanchard  46 y.o. female  PRE-OPERATIVE DIAGNOSIS:  FIBROIDS  POST-OPERATIVE DIAGNOSIS:  FIBROIDS  PROCEDURE:  Procedure(s): ROBOTIC ASSISTED TOTAL HYSTERECTOMY WITH SALPINGECTOMY (N/A)  SURGEON:  Surgeon(s) and Role:    * Bobbye Charleston, MD - Primary    * Jerelyn Charles, MD - Assisting  ANESTHESIA:   general  EBL:  Total I/O In: 1000 [I.V.:1000] Out: 400 [Urine:250; Blood:150]  LOCAL MEDICATIONS USED:  OTHER Ropivicaine  SPECIMEN:  Source of Specimen:  uterus, fibroid, cervix, bilateral tubes  DISPOSITION OF SPECIMEN:  PATHOLOGY  COUNTS:  YES  TOURNIQUET:  * No tourniquets in log *  DICTATION: .Note written in EPIC  PLAN OF CARE: Admit for overnight observation  PATIENT DISPOSITION:  PACU - hemodynamically stable.   Delay start of Pharmacological VTE agent (>24hrs) due to surgical blood loss or risk of bleeding: not applicable  Complications:  None.  Findings:  16 weeks size uterus with 12 cm fibroid extending into the  L broad ligament.  Ovaries were normal appearing. The ureters were identified during multiple points of the case and were always out of the field of dissection.  On cystoscopy, the bladder was intact and bilateral spill was seen from each ureteral orriface.    Medications:  Ancef.  Bupivicaine.    Technique:  After adequate anesthesia was achieved the patient was positioned, prepped and draped in usual sterile fashion.  A speculum was placed in the vagina and the cervix dilated with pratt dilators.  The Advincula with the  3 cm Koh ring were assembled and placed in proper fashion.  The  Speculum was removed and the bladder catheterized with a foley.    Attention was turned to the abdomen where a 1 cm incision was made 1 cm above the umbilicus.  The veress needle was introduced without aspiration of bowel contents or blood and the abdomen insufflated. The long 12 mm trocar was placed and the other three  trocar sites were marked out, all approximately 10 cm from each other and the camera.  Two 8.5 mm trocars were placed on either side of the camera port and a 5 mm assistant port was placed 3 cm above the L iliac crest.  All trocars were inserted under direct visualization of the camera.  The patient was placed in trendelenburg and then the Robot docked.  The PK forceps were placed on arm 2 and the Hot shears on arm 1 and introduced under direct visualization of the camera.  I then broke scrub and sat down at the console.  The above findings were noted and the ureters identified well out of the field of dissection.  The right fallopian tube was isolated and cauterized with the PK.  The Utero-ovarian ligament was then divided with the PK cautery and shears.  The posterior broad ligament was then divided with the hot shears until the uterosacral ligament.  The Broad and cardinal ligaments were then cauterized against the cervix to the level of the Koh ring, securing the uterine artery.  Each pedicle was then incised with the shears.  The anterior leaf was then incised at the reflection of the vessico-uterine junction and the lateral bladder retracted inferiorly after the round ligament had been divided with the PK forceps.  The left tube was cauterized with the PK and divided with the shears;  then the left utero-ovarian ligament divided with the PK forceps and the scissors.  The round ligament was divided as  well and the posterior leaf of the broad ligament then divided with the hot shears. The broad and cardinal ligaments were then cauterized on the left in the same way.   At the level of the internal os, the uterine arteries were bilaterally cauterized with the PK.  The ureters were identified well out of the field of dissection.  .   The bladder was then able to be retracted inferiorly and the vesico-uterine fascia was incised in the midline until the bladder was removed one cm below the Koh ring.  The hot  shears then circumferentially incised the vagina at the level of the reflection on the [facility] ring.  Once the uterus and cervix were amputated, cautery was used to insure hemostasis of the cuff.  The uterus was unable to fit with the fibroid through the top of the vagina so the fibroid was removed with blunt and sharp dissection with the hot shears until a small pedicle remained- thus the uterus and fibroid and tubes were removed through the vagina. Once hemostasis was achieved, the instruments were changed to the mega needle driver and mega suture cut needle driver and the cuff was closed with a running stitches of 0-vicryl V loc.  Cautery was used to ensure hemostasis of the left pedicles very superficially.  The ureters were peristalsing bilaterally well and very lateral to the areas of operation.    The Robot was then undocked and I scrubbed back in.  The needle was removed and Bupivicaine was introduced into the pelvis.  The fascia of the 12 mm trocar was closed with a figure of 8 stitch of 0 vicryl.  The skin incisions were closed with subcuticular stitches of 3-0 vicryl Rapide and Dermabond.  All instruments were removed from the vagina and cystoscopy performed, revealing an intact bladder and vigourous spill of indigo carmine from each ureteral orifice.  The cystoscope was removed and the patient taken to the recovery room in stable condition.  Persais Ethridge A

## 2017-03-18 ENCOUNTER — Encounter (HOSPITAL_COMMUNITY): Payer: Self-pay | Admitting: Obstetrics and Gynecology

## 2017-03-18 DIAGNOSIS — D259 Leiomyoma of uterus, unspecified: Secondary | ICD-10-CM | POA: Diagnosis not present

## 2017-03-18 MED ORDER — OXYCODONE-ACETAMINOPHEN 5-325 MG PO TABS: 1.0000 | ORAL_TABLET | ORAL | 0 refills | Status: DC | PRN

## 2017-03-18 NOTE — Discharge Summary (Signed)
Physician Discharge Summary  Patient ID: Connie Blanchard MRN: [id number] DOB/AGE: 48-Nov-1972 48 y.o.  Admit date: 03/17/2017 Discharge date: 03/18/2017  Admission Diagnoses:  Discharge Diagnoses:  Active Problems:   Postoperative state   Discharged Condition: good  Hospital Course: Uncomplicated Robotic TLH, bilateral salpingectomies, cystoscopy  Consults: None  Significant Diagnostic Studies: labs: preop only  Treatments: surgery: Uncomplicated Robotic TLH, bilateral salpingectomies, cystoscopy    Discharge Exam: Blood pressure (!) 98/53, pulse 69, temperature 98.3 F (36.8 C), temperature source Oral, resp. rate 18, height 5\' 7"  (1.702 m), weight 223 lb (101.2 kg), last menstrual period 03/03/2017, SpO2 96 %.   Disposition: 01-Home or Self Care  Discharge Instructions    Call MD for:  temperature >100.4    Complete by:  As directed    Diet - low sodium heart healthy    Complete by:  As directed    Discharge instructions    Complete by:  As directed    No driving on narcotics, no sexual activity for 2 weeks.   Increase activity slowly    Complete by:  As directed    May shower / Bathe    Complete by:  As directed    Shower, no bath for 2 weeks.   Remove dressing in 24 hours    Complete by:  As directed    Sexual Activity Restrictions    Complete by:  As directed    No sexual activity for 2 weeks.     Allergies as of 03/18/2017      Reactions   Penicillins Rash   Has patient had a PCN reaction causing immediate rash, facial/tongue/throat swelling, SOB or lightheadedness with hypotension: Unknown Has patient had a PCN reaction causing severe rash involving mucus membranes or skin necrosis: Unknown Has patient had a PCN reaction that required hospitalization: No Has patient had a PCN reaction occurring within the last 10 years: No Childhood allergy  If all of the above answers are "NO", then may proceed with Cephalosporin use.   Sulfa Antibiotics Rash       Medication List    TAKE these medications   b complex vitamins tablet Take 1 tablet by mouth daily.   esomeprazole 40 MG capsule Commonly known as:  NEXIUM TAKE 1 CAPSULE BY MOUTH DAILY BEFORE BREAKFAST.   FUSION PLUS Caps Take 1 capsule by mouth daily.   ibuprofen 200 MG tablet Commonly known as:  ADVIL,MOTRIN Take 800 mg by mouth every 8 (eight) hours as needed (for pain.).   KLOR-CON M20 20 MEQ tablet Generic drug:  potassium chloride SA TAKE 1 TABLET (20 MEQ TOTAL) BY MOUTH 3 (THREE) TIMES DAILY. What changed:  how much to take  when to take this  additional instructions   multivitamin tablet Take 1 tablet by mouth daily.   oxyCODONE-acetaminophen 5-325 MG tablet Commonly known as:  PERCOCET/ROXICET Take 1-2 tablets by mouth every 4 (four) hours as needed for severe pain (moderate to severe pain (when tolerating fluids)).   RESTORA Caps Take 1 capsule by mouth daily.   triamterene-hydrochlorothiazide 37.5-25 MG tablet Commonly known as:  MAXZIDE-25 TAKE 1 TABLET BY MOUTH DAILY.            Discharge Care Instructions        Start     Ordered   03/18/17 0000  oxyCODONE-acetaminophen (PERCOCET/ROXICET) 5-325 MG tablet  Every 4 hours PRN     03/18/17 0800   03/18/17 0000  Discharge instructions    Comments:  No  driving on narcotics, no sexual activity for 2 weeks.   03/18/17 0800   03/18/17 0000  Diet - low sodium heart healthy     03/18/17 0800   03/18/17 0000  Increase activity slowly     03/18/17 0800   03/18/17 0000  May shower / Bathe    Comments:  Shower, no bath for 2 weeks.   03/18/17 0800   03/18/17 0000  Sexual Activity Restrictions    Comments:  No sexual activity for 2 weeks.   03/18/17 0800   03/18/17 0000  Remove dressing in 24 hours     03/18/17 0800   03/18/17 0000  Call MD for:  temperature >100.4     03/18/17 0800     Follow-up Information    Bobbye Charleston, MD Follow up in 2 day(s).   Specialty:  Obstetrics and  Gynecology Contact information: [place] [address] [phone]           Signed: Vernel Donlan A 03/18/2017, 8:01 AM

## 2017-03-18 NOTE — Progress Notes (Signed)
Discharge instruction reviewed with patient.  Discussed signs and symptoms of postoperative infection, follow up appointments and medications. No questions at this time.  Paperwork reviewed and signed

## 2017-03-18 NOTE — Progress Notes (Signed)
Patient is eating, ambulating, and voiding.  Pain control is good.  BP (!) 98/53 (BP Location: Right Arm)   Pulse 69   Temp 98.3 F (36.8 C) (Oral)   Resp 18   Ht 5\' 7"  (1.702 m)   Wt 223 lb (101.2 kg)   LMP 03/03/2017 (Exact Date)   SpO2 96%   BMI 34.93 kg/m   lungs:   clear to auscultation cor:    RRR Abdomen:  soft, appropriate tenderness, incisions intact and without erythema or exudate. ex:    no cords   Lab Results  Component Value Date   WBC 6.4 03/05/2017   HGB 12.3 03/05/2017   HCT 36.7 03/05/2017   MCV 88.9 03/05/2017   PLT 231 03/05/2017    A/P  Routine care.  Expect d/c per plan.

## 2017-04-23 ENCOUNTER — Other Ambulatory Visit: Payer: Self-pay

## 2017-04-23 MED ORDER — POTASSIUM CHLORIDE CRYS ER 20 MEQ PO TBCR: 20.0000 meq | EXTENDED_RELEASE_TABLET | Freq: Three times a day (TID) | ORAL | 0 refills | Status: DC

## 2017-05-21 ENCOUNTER — Encounter: Payer: 59 | Admitting: Family Medicine

## 2017-05-23 ENCOUNTER — Other Ambulatory Visit: Payer: Self-pay | Admitting: Family Medicine

## 2017-05-25 ENCOUNTER — Ambulatory Visit (INDEPENDENT_AMBULATORY_CARE_PROVIDER_SITE_OTHER): Payer: 59 | Admitting: Family Medicine

## 2017-05-25 ENCOUNTER — Encounter: Payer: Self-pay | Admitting: Family Medicine

## 2017-05-25 VITALS — BP 124/82 | HR 69 | Temp 98.1°F | Resp 18 | Ht 67.0 in | Wt 236.0 lb

## 2017-05-25 DIAGNOSIS — E669 Obesity, unspecified: Secondary | ICD-10-CM | POA: Diagnosis not present

## 2017-05-25 DIAGNOSIS — Z0001 Encounter for general adult medical examination with abnormal findings: Secondary | ICD-10-CM

## 2017-05-25 DIAGNOSIS — M25511 Pain in right shoulder: Secondary | ICD-10-CM | POA: Diagnosis not present

## 2017-05-25 DIAGNOSIS — E782 Mixed hyperlipidemia: Secondary | ICD-10-CM | POA: Diagnosis not present

## 2017-05-25 DIAGNOSIS — L989 Disorder of the skin and subcutaneous tissue, unspecified: Secondary | ICD-10-CM | POA: Diagnosis not present

## 2017-05-25 DIAGNOSIS — D649 Anemia, unspecified: Secondary | ICD-10-CM | POA: Diagnosis not present

## 2017-05-25 DIAGNOSIS — Z0000 Encounter for general adult medical examination without abnormal findings: Secondary | ICD-10-CM

## 2017-05-25 DIAGNOSIS — R739 Hyperglycemia, unspecified: Secondary | ICD-10-CM

## 2017-05-25 DIAGNOSIS — I10 Essential (primary) hypertension: Secondary | ICD-10-CM

## 2017-05-25 HISTORY — DX: Hyperglycemia, unspecified: R73.9

## 2017-05-25 HISTORY — DX: Pain in right shoulder: M25.511

## 2017-05-25 NOTE — Assessment & Plan Note (Signed)
Encouraged DASH diet, decrease po intake and increase exercise as tolerated. Needs 7-8 hours of sleep nightly. Avoid trans fats, eat small, frequent meals every 4-5 hours with lean proteins, complex carbs and healthy fats. Minimize simple carbs, bariatric referral 

## 2017-05-25 NOTE — Assessment & Plan Note (Signed)
Encouraged heart healthy diet, increase exercise, avoid trans fats, consider a krill oil cap daily 

## 2017-05-25 NOTE — Assessment & Plan Note (Signed)
hgba1c acceptable, minimize simple carbs. Increase exercise as tolerated.  

## 2017-05-25 NOTE — Patient Instructions (Signed)
[employer], NOW company at [email or web address] Preventive Care 40-64 Years, Female Preventive care refers to lifestyle choices and visits with your health care provider that can promote health and wellness. What does preventive care include?  A yearly physical exam. This is also called an annual well check.  Dental exams once or twice a year.  Routine eye exams. Ask your health care provider how often you should have your eyes checked.  Personal lifestyle choices, including: ? Daily care of your teeth and gums. ? Regular physical activity. ? Eating a healthy diet. ? Avoiding tobacco and drug use. ? Limiting alcohol use. ? Practicing safe sex. ? Taking low-dose aspirin daily starting at age 59. ? Taking vitamin and mineral supplements as recommended by your health care provider. What happens during an annual well check? The services and screenings done by your health care provider during your annual well check will depend on your age, overall health, lifestyle risk factors, and family history of disease. Counseling Your health care provider may ask you questions about your:  Alcohol use.  Tobacco use.  Drug use.  Emotional well-being.  Home and relationship well-being.  Sexual activity.  Eating habits.  Work and work Statistician.  Method of birth control.  Menstrual cycle.  Pregnancy history.  Screening You may have the following tests or measurements:  Height, weight, and BMI.  Blood pressure.  Lipid and cholesterol levels. These may be checked every 5 years, or more frequently if you are over 19 years old.  Skin check.  Lung cancer screening. You may have this screening every year starting at age 78 if you have a 30-pack-year history of smoking and currently smoke or have quit within the past 15 years.  Fecal occult blood test (FOBT) of the stool. You may have this test every year starting at age 42.  Flexible sigmoidoscopy or colonoscopy. You may  have a sigmoidoscopy every 5 years or a colonoscopy every 10 years starting at age 18.  Hepatitis C blood test.  Hepatitis B blood test.  Sexually transmitted disease (STD) testing.  Diabetes screening. This is done by checking your blood sugar (glucose) after you have not eaten for a while (fasting). You may have this done every 1-3 years.  Mammogram. This may be done every 1-2 years. Talk to your health care provider about when you should start having regular mammograms. This may depend on whether you have a family history of breast cancer.  BRCA-related cancer screening. This may be done if you have a family history of breast, ovarian, tubal, or peritoneal cancers.  Pelvic exam and Pap test. This may be done every 3 years starting at age 76. Starting at age 56, this may be done every 5 years if you have a Pap test in combination with an HPV test.  Bone density scan. This is done to screen for osteoporosis. You may have this scan if you are at high risk for osteoporosis.  Discuss your test results, treatment options, and if necessary, the need for more tests with your health care provider. Vaccines Your health care provider may recommend certain vaccines, such as:  Influenza vaccine. This is recommended every year.  Tetanus, diphtheria, and acellular pertussis (Tdap, Td) vaccine. You may need a Td booster every 10 years.  Varicella vaccine. You may need this if you have not been vaccinated.  Zoster vaccine. You may need this after age 87.  Measles, mumps, and rubella (MMR) vaccine. You may need at least one dose of  MMR if you were born in 1957 or later. You may also need a second dose.  Pneumococcal 13-valent conjugate (PCV13) vaccine. You may need this if you have certain conditions and were not previously vaccinated.  Pneumococcal polysaccharide (PPSV23) vaccine. You may need one or two doses if you smoke cigarettes or if you have certain conditions.  Meningococcal vaccine.  You may need this if you have certain conditions.  Hepatitis A vaccine. You may need this if you have certain conditions or if you travel or work in places where you may be exposed to hepatitis A.  Hepatitis B vaccine. You may need this if you have certain conditions or if you travel or work in places where you may be exposed to hepatitis B.  Haemophilus influenzae type b (Hib) vaccine. You may need this if you have certain conditions.  Talk to your health care provider about which screenings and vaccines you need and how often you need them. This information is not intended to replace advice given to you by your health care provider. Make sure you discuss any questions you have with your health care provider. Document Released: 08/09/2015 Document Revised: 04/01/2016 Document Reviewed: 05/14/2015 Elsevier Interactive Patient Education  2017 [employer].

## 2017-05-25 NOTE — Assessment & Plan Note (Signed)
Has had a hysterctomy and will repeat CBC and iron studies

## 2017-05-25 NOTE — Progress Notes (Signed)
Subjective:  I acted as a Education administrator for Dr. Charlett Blake. [place], [place]  Patient ID: Connie Blanchard, female    DOB: [birth year], 46 y.o.   MRN: [id number]  No chief complaint on file.   HPI  Patient is in today for an annual exam and follow up on chronic medical concerns including hyperlipidemia and allergies. She is noting 2 acute concerns. She has left arm pain but denies any falls or trauam. Painful with movement. Also notes a new rash on her face. It is fine bumps mostly on her forehead. Not pruritic. No new products. No recent febrile illness or hospitalizations. Is trying to stay active and eat a heart healthy diet. Is frustrated with her weight. Denies CP/palp/SOB/HA/congestion/fevers/GI or GU c/o. Taking meds as prescribed  Patient Care Team: Mosie Lukes, MD as PCP - General (Family Medicine)   Past Medical History:  Diagnosis Date  . Acute bronchitis 06/24/2012  . Acute bronchitis with asthma 06/24/2012  . Allergic state 10/15/2013  . Back pain 10/15/2013  . Chronic sinusitis 02/2012   current runny nose of clear drainage  . Dental crowns present   . GERD (gastroesophageal reflux disease)   . H/O gestational diabetes mellitus, not currently pregnant 07/29/2011   h/o   . Headache    history of migraines  . History of blood transfusion   . Hyperglycemia 05/25/2017  . Hypertension   . Iron deficiency anemia   . Obese   . Right shoulder pain 05/25/2017    Past Surgical History:  Procedure Laterality Date  . COLONOSCOPY    . DILATION AND CURETTAGE OF UTERUS  1987  . NASAL SEPTOPLASTY W/ TURBINOPLASTY  03/23/2012   Procedure: NASAL SEPTOPLASTY WITH TURBINATE REDUCTION;  Surgeon: Jerrell Belfast, MD;  Location: Uncertain;  Service: ENT;  Laterality: Bilateral;  . ROBOTIC ASSISTED TOTAL HYSTERECTOMY WITH SALPINGECTOMY N/A 03/17/2017   Procedure: ROBOTIC ASSISTED TOTAL HYSTERECTOMY WITH SALPINGECTOMY;  Surgeon: Bobbye Charleston, MD;  Location: [facility];  Service:  Gynecology;  Laterality: N/A;  . SINUS ENDO W/FUSION  03/23/2012   Procedure: ENDOSCOPIC SINUS SURGERY WITH FUSION NAVIGATION;  Surgeon: Jerrell Belfast, MD;  Location: [facility];  Service: ENT;  Laterality: Bilateral;  with fusion scan  . UPPER GI ENDOSCOPY    . WISDOM TOOTH EXTRACTION      Family History  Problem Relation Age of Onset  . Diabetes Mother        type 2  . Hypertension Mother   . Depression Mother   . Mental illness Mother        bi-polar  . Pulmonary embolism Mother   . Heart disease Maternal Grandfather   . Stroke Maternal Grandfather   . Other Father        drug addiction  . Depression Maternal Aunt 46       suicide attempt with pneumonia    Social History   Social History  . Marital status: Married    Spouse name: N/A  . Number of children: N/A  . Years of education: N/A   Occupational History  . Not on file.   Social History Main Topics  . Smoking status: Former Smoker    Packs/day: 1.00    Years: 19.00    Quit date: 07/28/2003  . Smokeless tobacco: Never Used  . Alcohol use Yes     Comment: occasionally  . Drug use: No  . Sexual activity: Yes    Partners: Male     Comment:  lives with husband, stepson 65, son and daughter. no dietary restrictions. eating heart healthy, walking more   Other Topics Concern  . Not on file   Social History Narrative  . No narrative on file    Outpatient Medications Prior to Visit  Medication Sig Dispense Refill  . b complex vitamins tablet Take 1 tablet by mouth daily.    Marland Kitchen esomeprazole (NEXIUM) 40 MG capsule TAKE 1 CAPSULE BY MOUTH DAILY BEFORE BREAKFAST. 90 capsule 0  . Iron-FA-B Cmp-C-Biot-Probiotic (FUSION PLUS) CAPS Take 1 capsule by mouth daily.  12  . KLOR-CON M20 20 MEQ tablet TAKE 1 TABLET (20 MEQ TOTAL) BY MOUTH 3 (THREE) TIMES DAILY. 90 tablet 0  . Multiple Vitamin (MULTIVITAMIN) tablet Take 1 tablet by mouth daily.      . Probiotic Product (RESTORA) CAPS Take 1 capsule by mouth  daily.  12  . triamterene-hydrochlorothiazide (MAXZIDE-25) 37.5-25 MG tablet TAKE 1 TABLET BY MOUTH DAILY. 90 tablet 2  . ibuprofen (ADVIL,MOTRIN) 200 MG tablet Take 800 mg by mouth every 8 (eight) hours as needed (for pain.).    Marland Kitchen oxyCODONE-acetaminophen (PERCOCET/ROXICET) 5-325 MG tablet Take 1-2 tablets by mouth every 4 (four) hours as needed for severe pain (moderate to severe pain (when tolerating fluids)). 30 tablet 0   No facility-administered medications prior to visit.     Allergies  Allergen Reactions  . Penicillins Rash    Has patient had a PCN reaction causing immediate rash, facial/tongue/throat swelling, SOB or lightheadedness with hypotension: Unknown Has patient had a PCN reaction causing severe rash involving mucus membranes or skin necrosis: Unknown Has patient had a PCN reaction that required hospitalization: No Has patient had a PCN reaction occurring within the last 10 years: No Childhood allergy  If all of the above answers are "NO", then may proceed with Cephalosporin use.   . Sulfa Antibiotics Rash    Review of Systems  Constitutional: Negative for chills, fever and malaise/fatigue.  HENT: Negative for congestion and hearing loss.   Eyes: Negative for discharge.  Respiratory: Negative for cough, sputum production and shortness of breath.   Cardiovascular: Negative for chest pain, palpitations and leg swelling.  Gastrointestinal: Negative for abdominal pain, blood in stool, constipation, diarrhea, heartburn, nausea and vomiting.  Genitourinary: Negative for dysuria, frequency, hematuria and urgency.  Musculoskeletal: Positive for joint pain. Negative for back pain, falls and myalgias.  Skin: Positive for rash.  Neurological: Negative for dizziness, sensory change, loss of consciousness, weakness and headaches.  Endo/Heme/Allergies: Negative for environmental allergies. Does not bruise/bleed easily.  Psychiatric/Behavioral: Negative for depression and suicidal  ideas. The patient is not nervous/anxious and does not have insomnia.        Objective:    Physical Exam  Constitutional: She is oriented to person, place, and time. She appears well-developed and well-nourished. No distress.  HENT:  Head: Normocephalic and atraumatic.  Eyes: Conjunctivae are normal.  Neck: Neck supple. No thyromegaly present.  Cardiovascular: Normal rate, regular rhythm and normal heart sounds.   No murmur heard. Pulmonary/Chest: Effort normal and breath sounds normal. No respiratory distress.  Abdominal: Soft. Bowel sounds are normal. She exhibits no distension and no mass. There is no tenderness.  Musculoskeletal: She exhibits no edema.  decreaed ROM left shoulder   Lymphadenopathy:    She has no cervical adenopathy.  Neurological: She is alert and oriented to person, place, and time.  Skin: Skin is warm and dry.  Skin tag upper eyelid left eye and small cystic structures on  face  Psychiatric: She has a normal mood and affect. Her behavior is normal.    BP 124/82 (BP Location: Right Arm, Patient Position: Sitting, Cuff Size: Large)   Pulse 69   Temp 98.1 F (36.7 C) (Oral)   Resp 18   Ht 5\' 7"  (1.702 m)   Wt 236 lb (107 kg)   LMP 03/03/2017 (Exact Date)   SpO2 97%   BMI 36.96 kg/m  Wt Readings from Last 3 Encounters:  05/25/17 236 lb (107 kg)  03/17/17 223 lb (101.2 kg)  03/05/17 233 lb 8 oz (105.9 kg)   BP Readings from Last 3 Encounters:  05/25/17 124/82  03/18/17 112/61  03/05/17 122/88     Immunization History  Administered Date(s) Administered  . Influenza Split 06/21/2012  . Influenza Whole 05/28/2011  . Influenza,inj,Quad PF,6+ Mos 04/17/2014  . Tdap 07/29/2011    Health Maintenance  Topic Date Due  . INFLUENZA VACCINE  10/24/2017 (Originally 02/24/2017)  . PAP SMEAR  10/29/2017  . TETANUS/TDAP  07/28/2021  . HIV Screening  Completed    Lab Results  Component Value Date   WBC 6.4 03/05/2017   HGB 12.3 03/05/2017   HCT 36.7  03/05/2017   PLT 231 03/05/2017   GLUCOSE 89 05/19/2016   CHOL 174 09/27/2015   TRIG 87.0 09/27/2015   HDL 58.10 09/27/2015   LDLCALC 99 09/27/2015   ALT 15 05/19/2016   AST 15 05/19/2016   NA 137 05/19/2016   K 3.8 05/19/2016   CL 101 05/19/2016   CREATININE 0.91 05/19/2016   BUN 10 05/19/2016   CO2 30 05/19/2016   TSH 3.24 05/19/2016    Lab Results  Component Value Date   TSH 3.24 05/19/2016   Lab Results  Component Value Date   WBC 6.4 03/05/2017   HGB 12.3 03/05/2017   HCT 36.7 03/05/2017   MCV 88.9 03/05/2017   PLT 231 03/05/2017   Lab Results  Component Value Date   NA 137 05/19/2016   K 3.8 05/19/2016   CO2 30 05/19/2016   GLUCOSE 89 05/19/2016   BUN 10 05/19/2016   CREATININE 0.91 05/19/2016   BILITOT 0.3 05/19/2016   ALKPHOS 47 05/19/2016   AST 15 05/19/2016   ALT 15 05/19/2016   PROT 7.9 05/19/2016   ALBUMIN 4.1 05/19/2016   CALCIUM 9.2 05/19/2016   GFR 70.26 05/19/2016   Lab Results  Component Value Date   CHOL 174 09/27/2015   Lab Results  Component Value Date   HDL 58.10 09/27/2015   Lab Results  Component Value Date   LDLCALC 99 09/27/2015   Lab Results  Component Value Date   TRIG 87.0 09/27/2015   Lab Results  Component Value Date   CHOLHDL 3 09/27/2015   No results found for: HGBA1C       Assessment & Plan:   Problem List Items Addressed This Visit    Obese    Encouraged DASH diet, decrease po intake and increase exercise as tolerated. Needs 7-8 hours of sleep nightly. Avoid trans fats, eat small, frequent meals every 4-5 hours with lean proteins, complex carbs and healthy fats. Minimize simple carbs, bariatric referral      Preventative health care    Patient encouraged to maintain heart healthy diet, regular exercise, adequate sleep. Consider daily probiotics. Take medications as prescribed      Anemia    Has had a hysterctomy and will repeat CBC and iron studies      Relevant Orders  CBC   Iron   Essential  hypertension    Well controlled, no changes to meds. Encouraged heart healthy diet such as the DASH diet and exercise as tolerated.       Relevant Orders   CBC   Comprehensive metabolic panel   TSH   Hyperlipidemia, mixed    Encouraged heart healthy diet, increase exercise, avoid trans fats, consider a krill oil cap daily      Relevant Orders   Lipid panel   Comprehensive metabolic panel   Right shoulder pain    Worse with certain movement. And no trauma referred to sports medicine for further consideratoin and try Lidocaine and Advil      Relevant Orders   Ambulatory referral to Sports Medicine   Hyperglycemia    hgba1c acceptable, minimize simple carbs. Increase exercise as tolerated.       Relevant Orders   Hemoglobin A1c    Other Visit Diagnoses    Skin lesion of face    -  Primary   Relevant Orders   Ambulatory referral to Dermatology      I have discontinued Ms. Amborn's ibuprofen and oxyCODONE-acetaminophen. I am also having her maintain her multivitamin, esomeprazole, b complex vitamins, triamterene-hydrochlorothiazide, FUSION PLUS, RESTORA, and KLOR-CON M20.  No orders of the defined types were placed in this encounter.   CMA served as Education administrator during this visit. History, Physical and Plan performed by medical provider. Documentation and orders reviewed and attested to.  Penni Homans, MD

## 2017-05-25 NOTE — Assessment & Plan Note (Signed)
Well controlled, no changes to meds. Encouraged heart healthy diet such as the DASH diet and exercise as tolerated.  °

## 2017-05-25 NOTE — Assessment & Plan Note (Deleted)
hgba1c acceptable, minimize simple carbs. Increase exercise as tolerated. Continue current meds 

## 2017-05-25 NOTE — Assessment & Plan Note (Signed)
Patient encouraged to maintain heart healthy diet, regular exercise, adequate sleep. Consider daily probiotics. Take medications as prescribed 

## 2017-05-25 NOTE — Assessment & Plan Note (Signed)
Worse with certain movement. And no trauma referred to sports medicine for further consideratoin and try Lidocaine and Advil

## 2017-05-26 ENCOUNTER — Encounter: Payer: Self-pay | Admitting: Family Medicine

## 2017-05-26 ENCOUNTER — Ambulatory Visit (INDEPENDENT_AMBULATORY_CARE_PROVIDER_SITE_OTHER): Payer: 59 | Admitting: Family Medicine

## 2017-05-26 DIAGNOSIS — M25512 Pain in left shoulder: Secondary | ICD-10-CM

## 2017-05-26 HISTORY — DX: Pain in left shoulder: M25.512

## 2017-05-26 LAB — TSH: TSH: 2.32 u[IU]/mL (ref 0.35–4.50)

## 2017-05-26 LAB — COMPREHENSIVE METABOLIC PANEL
ALT: 21 U/L (ref 0–35)
AST: 21 U/L (ref 0–37)
Albumin: 4.2 g/dL (ref 3.5–5.2)
Alkaline Phosphatase: 51 U/L (ref 39–117)
BILIRUBIN TOTAL: 0.4 mg/dL (ref 0.2–1.2)
BUN: 14 mg/dL (ref 6–23)
CALCIUM: 9.5 mg/dL (ref 8.4–10.5)
CHLORIDE: 99 meq/L (ref 96–112)
CO2: 29 mEq/L (ref 19–32)
CREATININE: 0.98 mg/dL (ref 0.40–1.20)
GFR: 64.23 mL/min (ref >60.00)
Glucose, Bld: 80 mg/dL (ref 70–99)
Potassium: 3.7 mEq/L (ref 3.5–5.1)
Sodium: 137 mEq/L (ref 135–145)
Total Protein: 8 g/dL (ref 6.0–8.3)

## 2017-05-26 LAB — CBC
HCT: 40.1 % (ref 36.0–46.0)
Hemoglobin: 13.3 g/dL (ref 12.0–15.0)
MCHC: 33.2 g/dL (ref 30.0–36.0)
MCV: 90.9 fl (ref 78.0–100.0)
PLATELETS: 242 10*3/uL (ref 150.0–400.0)
RBC: 4.41 Mil/uL (ref 3.87–5.11)
RDW: 15 % (ref 11.5–15.5)
WBC: 6.9 10*3/uL (ref 4.0–10.5)

## 2017-05-26 LAB — LIPID PANEL
CHOLESTEROL: 200 mg/dL (ref 0–200)
HDL: 53.1 mg/dL (ref >39.00)
LDL CALC: 130 mg/dL — AB (ref 0–99)
NonHDL: 147.03
Total CHOL/HDL Ratio: 4
Triglycerides: 85 mg/dL (ref 0.0–149.0)
VLDL: 17 mg/dL (ref 0.0–40.0)

## 2017-05-26 LAB — HEMOGLOBIN A1C: Hgb A1c MFr Bld: 5.6 % (ref 4.6–6.5)

## 2017-05-26 LAB — IRON: IRON: 116 ug/dL (ref 42–145)

## 2017-05-26 NOTE — Patient Instructions (Signed)
You have rotator cuff impingement Try to avoid painful activities (overhead activities, lifting with extended arm) as much as possible. Aleve 2 tabs twice a day with food OR ibuprofen 3 tabs three times a day with food for pain and inflammation. Can take tylenol in addition to this. Subacromial injection may be beneficial to help with pain and to decrease inflammation. Consider physical therapy with transition to home exercise program. Do home exercise program with theraband and scapular stabilization exercises daily 3 sets of 10 once a day. If not improving at follow-up we will consider further imaging, injection, physical therapy, and/or nitro patches. Follow up with me in 6 weeks.

## 2017-05-27 NOTE — Progress Notes (Signed)
PCP and consultation requested by: Mosie Lukes, MD  Subjective:   HPI: Patient is a 46 y.o. female here for left shoulder pain.  Patient reports she's had about 1 month of lateral left shoulder pain. No acute injury or trauma. Pain worse with overhead motion, reaching behind. Pain level 3/10 but up to 8/10 at times, sharp. No night pain. No numbness, tingling, skin changes. No swelling. Tried ibuprofen which helps some. Right handed.  Past Medical History:  Diagnosis Date  . Acute bronchitis 06/24/2012  . Acute bronchitis with asthma 06/24/2012  . Allergic state 10/15/2013  . Back pain 10/15/2013  . Chronic sinusitis 02/2012   current runny nose of clear drainage  . Dental crowns present   . GERD (gastroesophageal reflux disease)   . H/O gestational diabetes mellitus, not currently pregnant 07/29/2011   h/o   . Headache    history of migraines  . History of blood transfusion   . Hyperglycemia 05/25/2017  . Hypertension   . Iron deficiency anemia   . Obese   . Right shoulder pain 05/25/2017    Current Outpatient Prescriptions on File Prior to Visit  Medication Sig Dispense Refill  . b complex vitamins tablet Take 1 tablet by mouth daily.    Marland Kitchen esomeprazole (NEXIUM) 40 MG capsule TAKE 1 CAPSULE BY MOUTH DAILY BEFORE BREAKFAST. 90 capsule 0  . Iron-FA-B Cmp-C-Biot-Probiotic (FUSION PLUS) CAPS Take 1 capsule by mouth daily.  12  . KLOR-CON M20 20 MEQ tablet TAKE 1 TABLET (20 MEQ TOTAL) BY MOUTH 3 (THREE) TIMES DAILY. 90 tablet 0  . Multiple Vitamin (MULTIVITAMIN) tablet Take 1 tablet by mouth daily.      . Probiotic Product (RESTORA) CAPS Take 1 capsule by mouth daily.  12  . triamterene-hydrochlorothiazide (MAXZIDE-25) 37.5-25 MG tablet TAKE 1 TABLET BY MOUTH DAILY. 90 tablet 2   No current facility-administered medications on file prior to visit.     Past Surgical History:  Procedure Laterality Date  . COLONOSCOPY    . DILATION AND CURETTAGE OF UTERUS  1987  .  NASAL SEPTOPLASTY W/ TURBINOPLASTY  03/23/2012   Procedure: NASAL SEPTOPLASTY WITH TURBINATE REDUCTION;  Surgeon: Jerrell Belfast, MD;  Location: [facility];  Service: ENT;  Laterality: Bilateral;  . ROBOTIC ASSISTED TOTAL HYSTERECTOMY WITH SALPINGECTOMY N/A 03/17/2017   Procedure: ROBOTIC ASSISTED TOTAL HYSTERECTOMY WITH SALPINGECTOMY;  Surgeon: Bobbye Charleston, MD;  Location: [facility];  Service: Gynecology;  Laterality: N/A;  . SINUS ENDO W/FUSION  03/23/2012   Procedure: ENDOSCOPIC SINUS SURGERY WITH FUSION NAVIGATION;  Surgeon: Jerrell Belfast, MD;  Location: [facility];  Service: ENT;  Laterality: Bilateral;  with fusion scan  . UPPER GI ENDOSCOPY    . WISDOM TOOTH EXTRACTION      Allergies  Allergen Reactions  . Penicillins Rash    Has patient had a PCN reaction causing immediate rash, facial/tongue/throat swelling, SOB or lightheadedness with hypotension: Unknown Has patient had a PCN reaction causing severe rash involving mucus membranes or skin necrosis: Unknown Has patient had a PCN reaction that required hospitalization: No Has patient had a PCN reaction occurring within the last 10 years: No Childhood allergy  If all of the above answers are "NO", then may proceed with Cephalosporin use.   . Sulfa Antibiotics Rash    Social History   Social History  . Marital status: Married    Spouse name: N/A  . Number of children: N/A  . Years of education: N/A   Occupational  History  . Not on file.   Social History Main Topics  . Smoking status: Former Smoker    Packs/day: 1.00    Years: 19.00    Quit date: 07/28/2003  . Smokeless tobacco: Never Used  . Alcohol use Yes     Comment: occasionally  . Drug use: No  . Sexual activity: Yes    Partners: Male     Comment: lives with husband, stepson 42, son and daughter. no dietary restrictions. eating heart healthy, walking more   Other Topics Concern  . Not on file   Social History Narrative  . No  narrative on file    Family History  Problem Relation Age of Onset  . Diabetes Mother        type 2  . Hypertension Mother   . Depression Mother   . Mental illness Mother        bi-polar  . Pulmonary embolism Mother   . Heart disease Maternal Grandfather   . Stroke Maternal Grandfather   . Other Father        drug addiction  . Depression Maternal Aunt 51       suicide attempt with pneumonia    BP 124/84   Pulse 80   Ht 5\' 7"  (1.702 m)   Wt 236 lb (107 kg)   LMP 03/03/2017 (Exact Date)   BMI 36.96 kg/m   Review of Systems: See HPI above.     Objective:  Physical Exam:  Gen: NAD, comfortable in exam room  Left shoulder: No swelling, ecchymoses.  No gross deformity. No TTP. FROM with painful arc. Positive Hawkins, Neers. Negative Yergasons. Strength 5/5 with empty can and resisted internal/external rotation. Negative apprehension. NV intact distally.  Right shoulder: No swelling, ecchymoses.  No gross deformity. No TTP. FROM. Strength 5/5 with empty can and resisted internal/external rotation. NV intact distally.   Assessment & Plan:  1. Left shoulder pain - 2/2 rotator cuff impingement.  Shown home exercises to do daily.  Aleve or ibuprofen.  Consider imaging, injection, PT, nitro patches if not improving.  F/u in 6 weeks.

## 2017-05-27 NOTE — Assessment & Plan Note (Signed)
2/2 rotator cuff impingement.  Shown home exercises to do daily.  Aleve or ibuprofen.  Consider imaging, injection, PT, nitro patches if not improving.  F/u in 6 weeks.

## 2017-06-02 ENCOUNTER — Encounter: Payer: Self-pay | Admitting: Family Medicine

## 2017-06-02 ENCOUNTER — Telehealth: Payer: Self-pay | Admitting: *Deleted

## 2017-06-02 NOTE — Telephone Encounter (Addendum)
Received Medical records [x2] from [facility]; forwarded to provider/SLS 11/07

## 2017-06-29 ENCOUNTER — Other Ambulatory Visit: Payer: Self-pay | Admitting: Family Medicine

## 2017-07-07 ENCOUNTER — Ambulatory Visit: Payer: 59 | Admitting: Family Medicine

## 2017-09-13 ENCOUNTER — Telehealth: Payer: 59 | Admitting: Family

## 2017-09-13 DIAGNOSIS — Z719 Counseling, unspecified: Secondary | ICD-10-CM

## 2017-09-13 NOTE — Progress Notes (Signed)
Invalid visit, for husband, not patient.   Guadelupe Sabin, DNP, FNP-BC [facility] E-Visit Team

## 2017-10-07 ENCOUNTER — Encounter (INDEPENDENT_AMBULATORY_CARE_PROVIDER_SITE_OTHER): Payer: Self-pay

## 2017-10-12 ENCOUNTER — Encounter (INDEPENDENT_AMBULATORY_CARE_PROVIDER_SITE_OTHER): Payer: Self-pay | Admitting: Family Medicine

## 2017-10-12 ENCOUNTER — Ambulatory Visit (INDEPENDENT_AMBULATORY_CARE_PROVIDER_SITE_OTHER): Payer: 59 | Admitting: Family Medicine

## 2017-10-12 VITALS — BP 127/81 | HR 61 | Temp 97.6°F | Ht 67.0 in | Wt 233.0 lb

## 2017-10-12 DIAGNOSIS — R5383 Other fatigue: Secondary | ICD-10-CM

## 2017-10-12 DIAGNOSIS — Z1331 Encounter for screening for depression: Secondary | ICD-10-CM

## 2017-10-12 DIAGNOSIS — Z9189 Other specified personal risk factors, not elsewhere classified: Secondary | ICD-10-CM

## 2017-10-12 DIAGNOSIS — I10 Essential (primary) hypertension: Secondary | ICD-10-CM

## 2017-10-12 DIAGNOSIS — Z6836 Body mass index (BMI) 36.0-36.9, adult: Secondary | ICD-10-CM | POA: Diagnosis not present

## 2017-10-12 DIAGNOSIS — E282 Polycystic ovarian syndrome: Secondary | ICD-10-CM | POA: Diagnosis not present

## 2017-10-12 DIAGNOSIS — R0602 Shortness of breath: Secondary | ICD-10-CM | POA: Insufficient documentation

## 2017-10-12 DIAGNOSIS — Z0289 Encounter for other administrative examinations: Secondary | ICD-10-CM

## 2017-10-12 HISTORY — DX: Other fatigue: R53.83

## 2017-10-12 HISTORY — DX: Shortness of breath: R06.02

## 2017-10-13 LAB — COMPREHENSIVE METABOLIC PANEL
ALBUMIN: 4.3 g/dL (ref 3.5–5.5)
ALT: 25 IU/L (ref 0–44)
AST: 22 IU/L (ref 0–40)
Albumin/Globulin Ratio: 1.2 (ref 1.2–2.2)
Alkaline Phosphatase: 56 IU/L (ref 39–117)
BILIRUBIN TOTAL: 0.4 mg/dL (ref 0.0–1.2)
BUN / CREAT RATIO: 15 (ref 9–20)
BUN: 14 mg/dL (ref 6–24)
CHLORIDE: 101 mmol/L (ref 96–106)
CO2: 27 mmol/L (ref 20–29)
Calcium: 9.2 mg/dL (ref 8.7–10.2)
Creatinine, Ser: 0.92 mg/dL (ref 0.76–1.27)
GFR calc non Af Amer: 98 mL/min/{1.73_m2} (ref >59)
GFR, EST AFRICAN AMERICAN: 113 mL/min/{1.73_m2} (ref >59)
Globulin, Total: 3.5 g/dL (ref 1.5–4.5)
Glucose: 96 mg/dL (ref 65–99)
POTASSIUM: 4.2 mmol/L (ref 3.5–5.2)
SODIUM: 140 mmol/L (ref 134–144)
TOTAL PROTEIN: 7.8 g/dL (ref 6.0–8.5)

## 2017-10-13 LAB — LIPID PANEL WITH LDL/HDL RATIO
Cholesterol, Total: 201 mg/dL — ABNORMAL HIGH (ref 100–199)
HDL: 56 mg/dL (ref >39)
LDL CALC: 130 mg/dL — AB (ref 0–99)
LDL/HDL RATIO: 2.3 ratio (ref 0.0–3.6)
TRIGLYCERIDES: 77 mg/dL (ref 0–149)
VLDL CHOLESTEROL CAL: 15 mg/dL (ref 5–40)

## 2017-10-13 LAB — CBC WITH DIFFERENTIAL
BASOS: 0 %
Basophils Absolute: 0 10*3/uL (ref 0.0–0.2)
EOS (ABSOLUTE): 0.5 10*3/uL — AB (ref 0.0–0.4)
Eos: 8 %
HEMATOCRIT: 40.9 % (ref 37.5–51.0)
Hemoglobin: 13.6 g/dL (ref 13.0–17.7)
IMMATURE GRANS (ABS): 0 10*3/uL (ref 0.0–0.1)
Immature Granulocytes: 0 %
LYMPHS: 32 %
Lymphocytes Absolute: 1.9 10*3/uL (ref 0.7–3.1)
MCH: 30.4 pg (ref 26.6–33.0)
MCHC: 33.3 g/dL (ref 31.5–35.7)
MCV: 92 fL (ref 79–97)
Monocytes Absolute: 0.5 10*3/uL (ref 0.1–0.9)
Monocytes: 9 %
NEUTROS ABS: 3.1 10*3/uL (ref 1.4–7.0)
NEUTROS PCT: 51 %
RBC: 4.47 x10E6/uL (ref 4.14–5.80)
RDW: 13.6 % (ref 12.3–15.4)
WBC: 6 10*3/uL (ref 3.4–10.8)

## 2017-10-13 LAB — T3: T3 TOTAL: 118 ng/dL (ref 71–180)

## 2017-10-13 LAB — INSULIN, RANDOM: INSULIN: 17 u[IU]/mL (ref 2.6–24.9)

## 2017-10-13 LAB — VITAMIN D 25 HYDROXY (VIT D DEFICIENCY, FRACTURES): Vit D, 25-Hydroxy: 31.1 ng/mL (ref 30.0–100.0)

## 2017-10-13 LAB — VITAMIN B12: Vitamin B-12: 663 pg/mL (ref 232–1245)

## 2017-10-13 LAB — TSH: TSH: 2.6 u[IU]/mL (ref 0.450–4.500)

## 2017-10-13 LAB — HEMOGLOBIN A1C
Est. average glucose Bld gHb Est-mCnc: 114 mg/dL
Hgb A1c MFr Bld: 5.6 % (ref 4.8–5.6)

## 2017-10-13 LAB — FOLATE: Folate: >20 ng/mL (ref >3.0)

## 2017-10-13 LAB — T4, FREE: Free T4: 1.32 ng/dL (ref 0.82–1.77)

## 2017-10-13 NOTE — Progress Notes (Signed)
Office: [phone]  /  Fax: [phone]   Dear Dr. Charlett Blanchard,   Thank you for referring Connie Blanchard to our clinic.The following note includes my evaluation and treatment recommendations.  HPI:   Chief Complaint: OBESITY    Connie Blanchard has been referred by Connie Levine A. Connie Blake, MD for consultation regarding her obesity and obesity related comorbidities.    Connie Blanchard (MR# [id number]) is a 47 y.o. female who presents on 10/13/2017 for obesity evaluation and treatment. Current BMI is Body mass index is 36.49 kg/m.Connie Blanchard has been struggling with her weight for many years and has been unsuccessful in either losing weight, maintaining weight loss, or reaching her healthy weight goal.     Connie Blanchard attended our information session and states she is currently in the action stage of change and ready to dedicate time achieving and maintaining a healthier weight. Connie Blanchard is interested in becoming our patient and working on intensive lifestyle modifications including (but not limited to) diet, exercise and weight loss.    Connie Blanchard states her desired weight loss is 77 lbs she has been heavy most of  her life she started gaining weight after having children her heaviest weight ever was 239 lbs. she has significant food cravings issues  she snacks frequently in the evenings she wakes up frequently in the middle of the night to eat she skips meals frequently she is frequently drinking liquids with calories she frequently eats larger portions than normal  she has binge eating behaviors she struggles with emotional eating    Fatigue Connie Blanchard feels her energy is lower than it should be. This has worsened with weight gain and has not worsened recently. Connie Blanchard admits to daytime somnolence and admits to waking up still tired. Patient is at risk for obstructive sleep apnea. Patent has a history of symptoms of daytime fatigue, morning fatigue, morning headache and hypertension. Patient  generally gets 4 or 5 hours of sleep per night, and states they generally have restless sleep. Snoring is present. Apneic episodes are not present. Epworth Sleepiness Score is 3  EKG was ordered today and was within normal limits.  Dyspnea on exertion Connie Blanchard doesn't exercise, but finds herself more short of breath, compared to others with activity and seems to be worsening over time with weight gain. She notes getting out of breath sooner with activity than she used to. This has not gotten worse recently. EKG was ordered today and was within normal limits. Connie Blanchard denies orthopnea.  Hypertension Connie Blanchard is a 47 y.o. female with hypertension. Connie Blanchard denies chest pain, chest pressure or headache. She is working weight loss to help control her blood pressure with the goal of decreasing her risk of heart attack and stroke. Connie Blanchard blood pressure is controlled today.  Polycystic Ovarian Syndrome Connie Blanchard has a diagnosis of PCOS. She is likely insulin resistant secondary to diagnosis.  At risk for diabetes Connie Blanchard is at higher than average risk for developing diabetes due to her obesity and PCOS. She currently denies polyuria or polydipsia.  Depression Screen Connie Blanchard's Food and Mood (modified PHQ-9) score was  Depression screen PHQ 2/9 10/12/2017  Decreased Interest 3  Down, Depressed, Hopeless 3  PHQ - 2 Score 6  Altered sleeping 3  Tired, decreased energy 3  Change in appetite 2  Feeling bad or failure about yourself  3  Trouble concentrating 2  Moving slowly or fidgety/restless 2  Suicidal thoughts 0  PHQ-9 Score 21  Difficult doing work/chores Somewhat difficult  ALLERGIES: Allergies  Allergen Reactions  . Penicillins Rash    Has patient had a PCN reaction causing immediate rash, facial/tongue/throat swelling, SOB or lightheadedness with hypotension: Unknown Has patient had a PCN reaction causing severe rash involving mucus membranes or skin necrosis:  Unknown Has patient had a PCN reaction that required hospitalization: No Has patient had a PCN reaction occurring within the last 10 years: No Childhood allergy  If all of the above answers are "NO", then may proceed with Cephalosporin use.   . Sulfa Antibiotics Rash    MEDICATIONS: Current Outpatient Medications on File Prior to Visit  Medication Sig Dispense Refill  . b complex vitamins tablet Take 1 tablet by mouth daily.    . cetirizine (ZYRTEC) 10 MG tablet Take 10 mg by mouth daily as needed for allergies.    Marland Kitchen esomeprazole (NEXIUM) 40 MG capsule TAKE 1 CAPSULE BY MOUTH DAILY BEFORE BREAKFAST. 90 capsule 0  . Iron-FA-B Cmp-C-Biot-Probiotic (FUSION PLUS) CAPS Take 1 capsule by mouth daily.  12  . KLOR-CON M20 20 MEQ tablet TAKE 1 TABLET (20 MEQ TOTAL) BY MOUTH 3 (THREE) TIMES DAILY. 90 tablet 0  . Multiple Vitamin (MULTIVITAMIN) tablet Take 1 tablet by mouth daily.      . Probiotic Product (RESTORA) CAPS Take 1 capsule by mouth daily.  12  . triamterene-hydrochlorothiazide (MAXZIDE-25) 37.5-25 MG tablet TAKE 1 TABLET BY MOUTH DAILY. 90 tablet 2   No current facility-administered medications on file prior to visit.     PAST MEDICAL HISTORY: Past Medical History:  Diagnosis Date  . Acute bronchitis 06/24/2012  . Acute bronchitis with asthma 06/24/2012  . Allergic state 10/15/2013  . Anxiety   . Back pain 10/15/2013  . Chronic sinusitis 02/2012   current runny nose of clear drainage  . Constipation   . Dental crowns present   . Depression   . GERD (gastroesophageal reflux disease)   . H/O gestational diabetes mellitus, not currently pregnant 07/29/2011   h/o   . Headache    history of migraines  . History of blood transfusion   . Hyperglycemia 05/25/2017  . Hypertension   . Iron deficiency anemia   . Leg edema   . Obese   . PCOS (polycystic ovarian syndrome)   . Right shoulder pain 05/25/2017    PAST SURGICAL HISTORY: Past Surgical History:  Procedure Laterality  Date  . COLONOSCOPY    . DILATION AND CURETTAGE OF UTERUS  1987  . NASAL SEPTOPLASTY W/ TURBINOPLASTY  03/23/2012   Procedure: NASAL SEPTOPLASTY WITH TURBINATE REDUCTION;  Surgeon: Jerrell Belfast, MD;  Location: [facility];  Service: ENT;  Laterality: Bilateral;  . ROBOTIC ASSISTED TOTAL HYSTERECTOMY WITH SALPINGECTOMY N/A 03/17/2017   Procedure: ROBOTIC ASSISTED TOTAL HYSTERECTOMY WITH SALPINGECTOMY;  Surgeon: Bobbye Charleston, MD;  Location: [facility];  Service: Gynecology;  Laterality: N/A;  . SINUS ENDO W/FUSION  03/23/2012   Procedure: ENDOSCOPIC SINUS SURGERY WITH FUSION NAVIGATION;  Surgeon: Jerrell Belfast, MD;  Location: [facility];  Service: ENT;  Laterality: Bilateral;  with fusion scan  . UPPER GI ENDOSCOPY    . WISDOM TOOTH EXTRACTION      SOCIAL HISTORY: Social History   Tobacco Use  . Smoking status: Former Smoker    Packs/day: 1.00    Years: 19.00    Pack years: 19.00    Last attempt to quit: 07/28/2003    Years since quitting: 14.2  . Smokeless tobacco: Never Used  Substance Use Topics  . Alcohol  use: Yes    Comment: occasionally  . Drug use: No    FAMILY HISTORY: Family History  Problem Relation Age of Onset  . Diabetes Mother        type 2  . Hypertension Mother   . Depression Mother   . Mental illness Mother        bi-polar  . Pulmonary embolism Mother   . Obesity Mother   . Heart disease Maternal Grandfather   . Stroke Maternal Grandfather   . Other Father        drug addiction  . Depression Maternal Aunt 46       suicide attempt with pneumonia    ROS: Review of Systems  Constitutional: Positive for malaise/fatigue.  HENT:       Hay Fever  Eyes:       Wear Glasses (reading only)  Respiratory: Positive for shortness of breath (on exertion).   Cardiovascular: Negative for chest pain.       Negative for chest pressure  Gastrointestinal: Positive for constipation, heartburn and nausea.  Genitourinary: Negative for  frequency.  Neurological: Positive for headaches.  Endo/Heme/Allergies: Negative for polydipsia.  Psychiatric/Behavioral: Positive for depression.       Stress     PHYSICAL EXAM: Blood pressure 127/81, pulse 61, temperature 97.6 F (36.4 C), temperature source Oral, height 5\' 7"  (1.702 m), weight 233 lb (105.7 kg), last menstrual period 03/03/2017, SpO2 100 %. Body mass index is 36.49 kg/m. Physical Exam  Constitutional: He is oriented to person, place, and time. He appears well-developed and well-nourished.  HENT:  Head: Normocephalic and atraumatic.  Nose: Nose normal.  Eyes: EOM are normal. No scleral icterus.  Neck: Normal range of motion. Neck supple. No thyromegaly present.  Cardiovascular: Normal rate and regular rhythm.  Pulmonary/Chest: Effort normal. No respiratory distress.  Abdominal: Soft. There is no tenderness.  +obesity  Musculoskeletal: Normal range of motion. He exhibits edema (2+ edema bilateral extremities).  Range of Motion normal in all 4 extremities  Neurological: He is alert and oriented to person, place, and time. Coordination normal.  Skin: Skin is warm and dry.  Psychiatric: He has a normal mood and affect. His behavior is normal.  Vitals reviewed.   RECENT LABS AND TESTS: BMET    Component Value Date/Time   NA 140 10/12/2017 1122   K 4.2 10/12/2017 1122   CL 101 10/12/2017 1122   CO2 27 10/12/2017 1122   GLUCOSE 96 10/12/2017 1122   GLUCOSE 80 05/25/2017 1442   BUN 14 10/12/2017 1122   CREATININE 0.92 10/12/2017 1122   CREATININE 0.99 03/31/2016 0952   CALCIUM 9.2 10/12/2017 1122   GFRNONAA 98 10/12/2017 1122   GFRNONAA 68 03/31/2016 0952   GFRAA 113 10/12/2017 1122   GFRAA 78 03/31/2016 0952   Lab Results  Component Value Date   HGBA1C 5.6 10/12/2017   Lab Results  Component Value Date   INSULIN 17.0 10/12/2017   CBC    Component Value Date/Time   WBC 6.0 10/12/2017 1122   WBC 6.9 05/25/2017 1442   RBC 4.47 10/12/2017 1122     RBC 4.41 05/25/2017 1442   HGB 13.6 10/12/2017 1122   HCT 40.9 10/12/2017 1122   PLT 242.0 05/25/2017 1442   MCV 92 10/12/2017 1122   MCH 30.4 10/12/2017 1122   MCH 29.8 03/05/2017 1100   MCHC 33.3 10/12/2017 1122   MCHC 33.2 05/25/2017 1442   RDW 13.6 10/12/2017 1122   LYMPHSABS 1.9 10/12/2017 1122  EOSABS 0.5 (H) 10/12/2017 1122   BASOSABS 0.0 10/12/2017 1122   Iron/TIBC/Ferritin/ %Sat    Component Value Date/Time   IRON 116 05/25/2017 1442   TIBC 397 10/29/2011 1552   IRONPCTSAT 7 (L) 10/29/2011 1552   Lipid Panel     Component Value Date/Time   CHOL 201 (H) 10/12/2017 1122   TRIG 77 10/12/2017 1122   HDL 56 10/12/2017 1122   CHOLHDL 4 05/25/2017 1442   VLDL 17.0 05/25/2017 1442   LDLCALC 130 (H) 10/12/2017 1122   Hepatic Function Panel     Component Value Date/Time   PROT 7.8 10/12/2017 1122   ALBUMIN 4.3 10/12/2017 1122   AST 22 10/12/2017 1122   ALT 25 10/12/2017 1122   ALKPHOS 56 10/12/2017 1122   BILITOT 0.4 10/12/2017 1122   BILIDIR 0.1 10/12/2013 1440   IBILI 0.3 10/12/2013 1440      Component Value Date/Time   TSH 2.600 10/12/2017 1122   TSH 2.32 05/25/2017 1442   TSH 3.24 05/19/2016 1550    ECG  shows NSR with a rate of 65 BPM INDIRECT CALORIMETER done today shows a VO2 of 316 and a REE of 2196.  Her calculated basal metabolic rate is 1610 thus her basal metabolic rate is better than expected.    ASSESSMENT AND PLAN: Other fatigue - Plan: EKG 12-Lead, Vitamin B12, CBC With Differential, Comprehensive metabolic panel, Folate, Hemoglobin A1c, Insulin, random, Lipid Panel With LDL/HDL Ratio, T3, T4, free, TSH, VITAMIN D 25 Hydroxy (Vit-D Deficiency, Fractures)  Shortness of breath on exertion - Plan: CBC With Differential  Essential hypertension  PCOS (polycystic ovarian syndrome)  Depression screening  At risk for diabetes mellitus  Class 2 severe obesity with serious comorbidity and body mass index (BMI) of 36.0 to 36.9 in adult,  unspecified obesity type (HCC)  PLAN: Fatigue Eliabeth was informed that her fatigue may be related to obesity, depression or many other causes. Labs will be ordered, and in the meanwhile Jahzaria has agreed to work on diet, exercise and weight loss to help with fatigue. Proper sleep hygiene was discussed including the need for 7-8 hours of quality sleep each night. A sleep study was not ordered based on symptoms and Epworth score. We will order indirect calorimetry.  Dyspnea on exertion Seaira's shortness of breath appears to be obesity related and exercise induced. She has agreed to work on weight loss and gradually increase exercise to treat her exercise induced shortness of breath. If Christabel follows our instructions and loses weight without improvement of her shortness of breath, we will plan to refer to pulmonology. We will order indirect calorimetry and labs and will monitor this condition regularly. Glady agrees to this plan.  Hypertension We discussed sodium restriction, working on healthy weight loss, and a regular exercise program as the means to achieve improved blood pressure control. Glen agreed with this plan and agreed to follow up as directed. We will continue to monitor her blood pressure as well as her progress with the above lifestyle modifications. We will check CMP and he will continue her medications as prescribed and will watch for signs of hypotension as she continues her lifestyle modifications.   Polycystic Ovarian Syndrome We will check insulin today and Lauriel agrees to follow up with our clinic in 2 weeks.  Diabetes risk counseling Grisel was given extended (15 minutes) diabetes prevention counseling today. She is 47 y.o. female and has risk factors for diabetes including obesity and PCOS. We discussed intensive lifestyle modifications today  with an emphasis on weight loss as well as increasing exercise and decreasing simple carbohydrates in her  diet.  Depression Screen Jalaila had a strongly positive depression screening. Depression is commonly associated with obesity and often results in emotional eating behaviors. We will monitor this closely and work on CBT to help improve the non-hunger eating patterns. Referral to Psychology may be required if no improvement is seen as she continues in our clinic.  Obesity Vasiliki is currently in the action stage of change and her goal is to continue with weight loss efforts. I recommend Nathaly begin the structured treatment plan as follows:  She has agreed to follow the Category 3 plan +100 calories as needed Lasonya has been instructed to eventually work up to a goal of 150 minutes of combined cardio and strengthening exercise per week for weight loss and overall health benefits. We discussed the following Behavioral Modification Strategies today: planning for success, increasing lean protein intake, increasing vegetables and work on meal planning and easy cooking plans   She was informed of the importance of frequent follow up visits to maximize her success with intensive lifestyle modifications for her multiple health conditions. She was informed we would discuss her lab results at her next visit unless there is a critical issue that needs to be addressed sooner. Maylyn agreed to keep her next visit at the agreed upon time to discuss these results.    OBESITY BEHAVIORAL INTERVENTION VISIT  Today's visit was # 1 out of 22.  Starting weight: 233 lbs Starting date: 10/12/17 Today's weight : 233 lbs  Today's date: 10/12/2017 Total lbs lost to date: 0 (Patients must lose 7 lbs in the first 6 months to continue with counseling)   ASK: We discussed the diagnosis of obesity with Connie Blanchard today and Naydelin agreed to give [place] permission to discuss obesity behavioral modification therapy today.  ASSESS: Ameia has the diagnosis of obesity and her BMI today is 36.48 Klair  is in the action stage of change   ADVISE: Katiria was educated on the multiple health risks of obesity as well as the benefit of weight loss to improve her health. She was advised of the need for long term treatment and the importance of lifestyle modifications.  AGREE: Multiple dietary modification options and treatment options were discussed and  Genie agreed to the above obesity treatment plan.   I, Doreene Nest, am acting as transcriptionist for  Eber Jones, MD   I have reviewed the above documentation for accuracy and completeness, and I agree with the above. - Ilene Qua, MD

## 2017-10-26 ENCOUNTER — Ambulatory Visit (INDEPENDENT_AMBULATORY_CARE_PROVIDER_SITE_OTHER): Payer: 59 | Admitting: Family Medicine

## 2017-10-26 VITALS — BP 111/71 | HR 61 | Temp 98.3°F | Ht 67.0 in | Wt 227.0 lb

## 2017-10-26 DIAGNOSIS — Z9189 Other specified personal risk factors, not elsewhere classified: Secondary | ICD-10-CM | POA: Diagnosis not present

## 2017-10-26 DIAGNOSIS — E66812 Obesity, class 2: Secondary | ICD-10-CM

## 2017-10-26 DIAGNOSIS — E8881 Metabolic syndrome: Secondary | ICD-10-CM | POA: Diagnosis not present

## 2017-10-26 DIAGNOSIS — E559 Vitamin D deficiency, unspecified: Secondary | ICD-10-CM

## 2017-10-26 DIAGNOSIS — E88819 Insulin resistance, unspecified: Secondary | ICD-10-CM

## 2017-10-26 DIAGNOSIS — Z6835 Body mass index (BMI) 35.0-35.9, adult: Secondary | ICD-10-CM | POA: Diagnosis not present

## 2017-10-26 DIAGNOSIS — I10 Essential (primary) hypertension: Secondary | ICD-10-CM | POA: Diagnosis not present

## 2017-10-26 MED ORDER — VITAMIN D (ERGOCALCIFEROL) 1.25 MG (50000 UNIT) PO CAPS: 50000.0000 [IU] | ORAL_CAPSULE | ORAL | 0 refills | Status: DC

## 2017-10-26 MED ORDER — METFORMIN HCL 500 MG PO TABS: 500.0000 mg | ORAL_TABLET | Freq: Every day | ORAL | 0 refills | Status: DC

## 2017-10-26 NOTE — Progress Notes (Signed)
Office: [phone]  /  Fax: [phone]   HPI:   Chief Complaint: OBESITY Connie Blanchard is here to discuss her progress with her obesity treatment plan. She is on the Category 3 plan + 100 calories and is following her eating plan approximately 97 % of the time. She states she is walking for 30 minutes 1 times per week. Windie felt she did well, liked eating all the food. Found dinner portion of meat to be large. No hunger.  Her weight is 227 lb (103 kg) today and has had a weight loss of 6 pounds over a period of 2 weeks since her last visit. She has lost 6 lbs since starting treatment with [place].  Vitamin D Deficiency Iria has a diagnosis of vitamin D deficiency. She is not on Vit D supplement currently. She notes fatigue and denies nausea, vomiting or muscle weakness.  Insulin Resistance Keyanah has a diagnosis of insulin resistance based on her elevated fasting insulin level >5. Insulin of 17.0 and Hgb A1c of 5.6. Although Mollee's blood glucose readings are still under good control, insulin resistance puts her at greater risk of metabolic syndrome and diabetes. She is not taking metformin currently and continues to work on diet and exercise to decrease risk of diabetes.  At risk for diabetes Amariz is at higher than average risk for developing diabetes due to her obesity and insulin resistance. She currently denies polyuria or polydipsia.  Hypertension ELLIOTTE MARSALIS is a 46 y.o. female with hypertension. Shauntel's blood pressure is on lower end of normal. She felt dizzy and lightheaded. She is working weight loss to help control her blood pressure with the goal of decreasing her risk of heart attack and stroke. Adiba's blood pressure is currently controlled.  ALLERGIES: Allergies  Allergen Reactions  . Penicillins Rash    Has patient had a PCN reaction causing immediate rash, facial/tongue/throat swelling, SOB or lightheadedness with hypotension: Unknown Has patient  had a PCN reaction causing severe rash involving mucus membranes or skin necrosis: Unknown Has patient had a PCN reaction that required hospitalization: No Has patient had a PCN reaction occurring within the last 10 years: No Childhood allergy  If all of the above answers are "NO", then may proceed with Cephalosporin use.   . Sulfa Antibiotics Rash    MEDICATIONS: Current Outpatient Medications on File Prior to Visit  Medication Sig Dispense Refill  . b complex vitamins tablet Take 1 tablet by mouth daily.    . cetirizine (ZYRTEC) 10 MG tablet Take 10 mg by mouth daily as needed for allergies.    Marland Kitchen esomeprazole (NEXIUM) 40 MG capsule TAKE 1 CAPSULE BY MOUTH DAILY BEFORE BREAKFAST. 90 capsule 0  . Iron-FA-B Cmp-C-Biot-Probiotic (FUSION PLUS) CAPS Take 1 capsule by mouth daily.  12  . KLOR-CON M20 20 MEQ tablet TAKE 1 TABLET (20 MEQ TOTAL) BY MOUTH 3 (THREE) TIMES DAILY. 90 tablet 0  . Multiple Vitamin (MULTIVITAMIN) tablet Take 1 tablet by mouth daily.      . Probiotic Product (RESTORA) CAPS Take 1 capsule by mouth daily.  12  . triamterene-hydrochlorothiazide (MAXZIDE-25) 37.5-25 MG tablet TAKE 1 TABLET BY MOUTH DAILY. 90 tablet 2   No current facility-administered medications on file prior to visit.     PAST MEDICAL HISTORY: Past Medical History:  Diagnosis Date  . Acute bronchitis 06/24/2012  . Acute bronchitis with asthma 06/24/2012  . Allergic state 10/15/2013  . Anxiety   . Back pain 10/15/2013  . Chronic sinusitis 02/2012  current runny nose of clear drainage  . Constipation   . Dental crowns present   . Depression   . GERD (gastroesophageal reflux disease)   . H/O gestational diabetes mellitus, not currently pregnant 07/29/2011   h/o   . Headache    history of migraines  . History of blood transfusion   . Hyperglycemia 05/25/2017  . Hypertension   . Iron deficiency anemia   . Leg edema   . Obese   . PCOS (polycystic ovarian syndrome)   . Right shoulder pain  05/25/2017    PAST SURGICAL HISTORY: Past Surgical History:  Procedure Laterality Date  . COLONOSCOPY    . DILATION AND CURETTAGE OF UTERUS  1987  . NASAL SEPTOPLASTY W/ TURBINOPLASTY  03/23/2012   Procedure: NASAL SEPTOPLASTY WITH TURBINATE REDUCTION;  Surgeon: Jerrell Belfast, MD;  Location: [facility];  Service: ENT;  Laterality: Bilateral;  . ROBOTIC ASSISTED TOTAL HYSTERECTOMY WITH SALPINGECTOMY N/A 03/17/2017   Procedure: ROBOTIC ASSISTED TOTAL HYSTERECTOMY WITH SALPINGECTOMY;  Surgeon: Bobbye Charleston, MD;  Location: [facility];  Service: Gynecology;  Laterality: N/A;  . SINUS ENDO W/FUSION  03/23/2012   Procedure: ENDOSCOPIC SINUS SURGERY WITH FUSION NAVIGATION;  Surgeon: Jerrell Belfast, MD;  Location: [facility];  Service: ENT;  Laterality: Bilateral;  with fusion scan  . UPPER GI ENDOSCOPY    . WISDOM TOOTH EXTRACTION      SOCIAL HISTORY: Social History   Tobacco Use  . Smoking status: Former Smoker    Packs/day: 1.00    Years: 19.00    Pack years: 19.00    Last attempt to quit: 07/28/2003    Years since quitting: 14.2  . Smokeless tobacco: Never Used  Substance Use Topics  . Alcohol use: Yes    Comment: occasionally  . Drug use: No    FAMILY HISTORY: Family History  Problem Relation Age of Onset  . Diabetes Mother        type 2  . Hypertension Mother   . Depression Mother   . Mental illness Mother        bi-polar  . Pulmonary embolism Mother   . Obesity Mother   . Heart disease Maternal Grandfather   . Stroke Maternal Grandfather   . Other Father        drug addiction  . Depression Maternal Aunt 46       suicide attempt with pneumonia    ROS: Review of Systems  Constitutional: Positive for malaise/fatigue and weight loss.  Gastrointestinal: Negative for nausea and vomiting.  Genitourinary: Negative for frequency.  Musculoskeletal:       Negative muscle weakness  Neurological: Positive for dizziness.       +  Lightheadedness  Endo/Heme/Allergies: Negative for polydipsia.    PHYSICAL EXAM: Blood pressure 111/71, pulse 61, temperature 98.3 F (36.8 C), temperature source Oral, height 5\' 7"  (1.702 m), weight 227 lb (103 kg), last menstrual period 03/03/2017, SpO2 99 %. Body mass index is 35.55 kg/m. Physical Exam  Constitutional: She is oriented to person, place, and time. She appears well-developed and well-nourished.  Cardiovascular: Normal rate.  Pulmonary/Chest: Effort normal.  Musculoskeletal: Normal range of motion.  Neurological: She is oriented to person, place, and time.  Skin: Skin is warm and dry.  Psychiatric: She has a normal mood and affect. Her behavior is normal.  Vitals reviewed.   RECENT LABS AND TESTS: BMET    Component Value Date/Time   NA 140 10/12/2017 1122   K 4.2 10/12/2017  1122   CL 101 10/12/2017 1122   CO2 27 10/12/2017 1122   GLUCOSE 96 10/12/2017 1122   GLUCOSE 80 05/25/2017 1442   BUN 14 10/12/2017 1122   CREATININE 0.92 10/12/2017 1122   CREATININE 0.99 03/31/2016 0952   CALCIUM 9.2 10/12/2017 1122   GFRNONAA 98 10/12/2017 1122   GFRNONAA 68 03/31/2016 0952   GFRAA 113 10/12/2017 1122   GFRAA 78 03/31/2016 0952   Lab Results  Component Value Date   HGBA1C 5.6 10/12/2017   HGBA1C 5.6 05/25/2017   Lab Results  Component Value Date   INSULIN 17.0 10/12/2017   CBC    Component Value Date/Time   WBC 6.0 10/12/2017 1122   WBC 6.9 05/25/2017 1442   RBC 4.47 10/12/2017 1122   RBC 4.41 05/25/2017 1442   HGB 13.6 10/12/2017 1122   HCT 40.9 10/12/2017 1122   PLT 242.0 05/25/2017 1442   MCV 92 10/12/2017 1122   MCH 30.4 10/12/2017 1122   MCH 29.8 03/05/2017 1100   MCHC 33.3 10/12/2017 1122   MCHC 33.2 05/25/2017 1442   RDW 13.6 10/12/2017 1122   LYMPHSABS 1.9 10/12/2017 1122   EOSABS 0.5 (H) 10/12/2017 1122   BASOSABS 0.0 10/12/2017 1122   Iron/TIBC/Ferritin/ %Sat    Component Value Date/Time   IRON 116 05/25/2017 1442   TIBC 397  10/29/2011 1552   IRONPCTSAT 7 (L) 10/29/2011 1552   Lipid Panel     Component Value Date/Time   CHOL 201 (H) 10/12/2017 1122   TRIG 77 10/12/2017 1122   HDL 56 10/12/2017 1122   CHOLHDL 4 05/25/2017 1442   VLDL 17.0 05/25/2017 1442   LDLCALC 130 (H) 10/12/2017 1122   Hepatic Function Panel     Component Value Date/Time   PROT 7.8 10/12/2017 1122   ALBUMIN 4.3 10/12/2017 1122   AST 22 10/12/2017 1122   ALT 25 10/12/2017 1122   ALKPHOS 56 10/12/2017 1122   BILITOT 0.4 10/12/2017 1122   BILIDIR 0.1 10/12/2013 1440   IBILI 0.3 10/12/2013 1440      Component Value Date/Time   TSH 2.600 10/12/2017 1122   TSH 2.32 05/25/2017 1442   TSH 3.24 05/19/2016 1550  Results for DESTYNE, GOODREAU (MRN [id number]) as of 10/26/2017 16:51  Ref. Range 10/12/2017 11:22  Vitamin D, 25-Hydroxy Latest Ref Range: 30.0 - 100.0 ng/mL 31.1    ASSESSMENT AND PLAN: Vitamin D deficiency - Plan: Vitamin D, Ergocalciferol, (DRISDOL) 50000 units CAPS capsule  Insulin resistance - Plan: metFORMIN (GLUCOPHAGE) 500 MG tablet  Essential hypertension  At risk for diabetes mellitus  Class 2 severe obesity with serious comorbidity and body mass index (BMI) of 35.0 to 35.9 in adult, unspecified obesity type ([place])  PLAN:  Vitamin D Deficiency Maria was informed that low vitamin D levels contributes to fatigue and are associated with obesity, breast, and colon cancer. Lynnet agrees to start prescription Vit D @50 ,000 IU every week #4 with no refills. She will follow up for routine testing of vitamin D, at least 2-3 times per year. She was informed of the risk of over-replacement of vitamin D and agrees to not increase her dose unless she discusses this with [place] first. Veora agrees to follow up with our clinic in 2 weeks.  Insulin Resistance Syrena will continue to work on weight loss, exercise, and decreasing simple carbohydrates in her diet to help decrease the risk of diabetes. We dicussed metformin  including benefits and risks. She was informed that eating too many simple  carbohydrates or too many calories at one sitting increases the likelihood of GI side effects. Ellysia agrees to start metformin 500 mg PO q AM #30 with no refills. Yeraldin agrees to follow up with our clinic in 2 weeks as directed to monitor her progress.  Diabetes risk counselling Lovelle was given extended (30 minutes) diabetes prevention counseling today. She is 47 y.o. female and has risk factors for diabetes including obesity and insulin resistance. We discussed intensive lifestyle modifications today with an emphasis on weight loss as well as increasing exercise and decreasing simple carbohydrates in her diet.  Hypertension We discussed sodium restriction, working on healthy weight loss, and a regular exercise program as the means to achieve improved blood pressure control. Gwendoline agreed with this plan and agreed to follow up as directed. We will continue to monitor her blood pressure as well as her progress with the above lifestyle modifications. She will continue her medications as prescribed and will watch for signs of hypotension as she continues her lifestyle modifications.  Obesity Nera is currently in the action stage of change. As such, her goal is to continue with weight loss efforts She has agreed to follow the Category 3 plan + 100 calories Kristia has been instructed to work up to a goal of 150 minutes of combined cardio and strengthening exercise per week for weight loss and overall health benefits. We discussed the following Behavioral Modification Strategies today: increasing lean protein intake, work on meal planning and easy cooking plans, increase H20 intake, better snacking choices, and planning for success   Chidinma has agreed to follow up with our clinic in 2 weeks. She was informed of the importance of frequent follow up visits to maximize her success with intensive lifestyle  modifications for her multiple health conditions.   OBESITY BEHAVIORAL INTERVENTION VISIT  Today's visit was # 2 out of 22.  Starting weight: 233 lbs Starting date: 10/12/17 Today's weight : 227 lbs  Today's date: 10/26/2017 Total lbs lost to date: 6 (Patients must lose 7 lbs in the first 6 months to continue with counseling)   ASK: We discussed the diagnosis of obesity with [name] today and Adilyn agreed to give [place] permission to discuss obesity behavioral modification therapy today.  ASSESS: Thanya has the diagnosis of obesity and her BMI today is 35.55 Gwenivere is in the action stage of change   ADVISE: Sherrel was educated on the multiple health risks of obesity as well as the benefit of weight loss to improve her health. She was advised of the need for long term treatment and the importance of lifestyle modifications.  AGREE: Multiple dietary modification options and treatment options were discussed and  Wilson agreed to the above obesity treatment plan.  I, Trixie Dredge, am acting as transcriptionist for Ilene Qua, MD  I have reviewed the above documentation for accuracy and completeness, and I agree with the above. - Ilene Qua, MD

## 2017-10-29 ENCOUNTER — Encounter (INDEPENDENT_AMBULATORY_CARE_PROVIDER_SITE_OTHER): Payer: Self-pay | Admitting: Family Medicine

## 2017-11-05 ENCOUNTER — Other Ambulatory Visit: Payer: Self-pay | Admitting: Family Medicine

## 2017-11-10 ENCOUNTER — Encounter (INDEPENDENT_AMBULATORY_CARE_PROVIDER_SITE_OTHER): Payer: Self-pay

## 2017-11-10 ENCOUNTER — Ambulatory Visit (INDEPENDENT_AMBULATORY_CARE_PROVIDER_SITE_OTHER): Payer: 59 | Admitting: Family Medicine

## 2017-11-10 ENCOUNTER — Encounter (INDEPENDENT_AMBULATORY_CARE_PROVIDER_SITE_OTHER): Payer: Self-pay | Admitting: Family Medicine

## 2017-11-10 NOTE — Telephone Encounter (Signed)
Just an FYI that pt said she will be calling to cancel appointment. Thank you ladies

## 2017-11-11 ENCOUNTER — Ambulatory Visit (INDEPENDENT_AMBULATORY_CARE_PROVIDER_SITE_OTHER): Payer: 59 | Admitting: Family Medicine

## 2017-11-11 VITALS — BP 133/78 | HR 65 | Temp 98.4°F | Ht 67.0 in | Wt 225.0 lb

## 2017-11-11 DIAGNOSIS — E559 Vitamin D deficiency, unspecified: Secondary | ICD-10-CM | POA: Diagnosis not present

## 2017-11-11 DIAGNOSIS — I10 Essential (primary) hypertension: Secondary | ICD-10-CM

## 2017-11-11 DIAGNOSIS — E8881 Metabolic syndrome: Secondary | ICD-10-CM | POA: Diagnosis not present

## 2017-11-11 DIAGNOSIS — Z9189 Other specified personal risk factors, not elsewhere classified: Secondary | ICD-10-CM

## 2017-11-11 DIAGNOSIS — Z6835 Body mass index (BMI) 35.0-35.9, adult: Secondary | ICD-10-CM

## 2017-11-11 MED ORDER — VITAMIN D (ERGOCALCIFEROL) 1.25 MG (50000 UNIT) PO CAPS: 50000.0000 [IU] | ORAL_CAPSULE | ORAL | 0 refills | Status: DC

## 2017-11-11 MED ORDER — METFORMIN HCL 500 MG PO TABS: 500.0000 mg | ORAL_TABLET | Freq: Every day | ORAL | 0 refills | Status: DC

## 2017-11-14 NOTE — Progress Notes (Signed)
Office: [phone]  /  Fax: [phone]   HPI:   Chief Complaint: OBESITY Connie Blanchard is here to discuss her progress with her obesity treatment plan. She is on the Category 3 plan + 100 calories and is following her eating plan approximately 95 % of the time. She states she is walking for 30 minutes 2 times per week. Connie Blanchard continues to do well with weight loss but is getting bored of dinner, mostly still struggling to eat all her evening protein.  Her weight is 225 lb (102.1 kg) today and has had a weight loss of 2 pounds over a period of 2 weeks since her last visit. She has lost 8 lbs since starting treatment with [place].  Hypertension Connie Blanchard is a 47 y.o. female with hypertension. Connie Blanchard discontinued her Maxzide due to low blood pressure and she feels better. She denies further lightheadedness. She is working weight loss to help control her blood pressure with the goal of decreasing her risk of heart attack and stroke. Connie Blanchard's blood pressure is currently controlled.  Insulin Resistance Connie Blanchard has a diagnosis of insulin resistance based on her elevated fasting insulin level >5. Although Connie Blanchard's blood glucose readings are still under good control, insulin resistance puts her at greater risk of metabolic syndrome and diabetes. She started metformin, no GI upset, doing well on diet prescription and continues to work on diet and exercise to decrease risk of diabetes. She denies hypoglycemia.  At risk for diabetes Connie Blanchard is at higher than average risk for developing diabetes due to her obesity and insulin resistance. She currently denies polyuria or polydipsia.  Vitamin D Deficiency Connie Blanchard has a diagnosis of vitamin D deficiency. She is stable on prescription Vit D and denies nausea, vomiting or muscle weakness.  ALLERGIES: Allergies  Allergen Reactions  . Penicillins Rash    Has patient had a PCN reaction causing immediate rash, facial/tongue/throat swelling, SOB  or lightheadedness with hypotension: Unknown Has patient had a PCN reaction causing severe rash involving mucus membranes or skin necrosis: Unknown Has patient had a PCN reaction that required hospitalization: No Has patient had a PCN reaction occurring within the last 10 years: No Childhood allergy  If all of the above answers are "NO", then may proceed with Cephalosporin use.   . Sulfa Antibiotics Rash    MEDICATIONS: Current Outpatient Medications on File Prior to Visit  Medication Sig Dispense Refill  . b complex vitamins tablet Take 1 tablet by mouth daily.    . cetirizine (ZYRTEC) 10 MG tablet Take 10 mg by mouth daily as needed for allergies.    Marland Kitchen esomeprazole (NEXIUM) 40 MG capsule TAKE 1 CAPSULE BY MOUTH DAILY BEFORE BREAKFAST. 90 capsule 0  . Iron-FA-B Cmp-C-Biot-Probiotic (FUSION PLUS) CAPS Take 1 capsule by mouth daily.  12  . KLOR-CON M20 20 MEQ tablet TAKE 1 TABLET (20 MEQ TOTAL) BY MOUTH 3 (THREE) TIMES DAILY. 90 tablet 0  . Multiple Vitamin (MULTIVITAMIN) tablet Take 1 tablet by mouth daily.      . Probiotic Product (RESTORA) CAPS Take 1 capsule by mouth daily.  12  . triamterene-hydrochlorothiazide (MAXZIDE-25) 37.5-25 MG tablet TAKE 1 TABLET BY MOUTH DAILY. 90 tablet 2   No current facility-administered medications on file prior to visit.     PAST MEDICAL HISTORY: Past Medical History:  Diagnosis Date  . Acute bronchitis 06/24/2012  . Acute bronchitis with asthma 06/24/2012  . Allergic state 10/15/2013  . Anxiety   . Back pain 10/15/2013  . Chronic sinusitis  02/2012   current runny nose of clear drainage  . Constipation   . Dental crowns present   . Depression   . GERD (gastroesophageal reflux disease)   . H/O gestational diabetes mellitus, not currently pregnant 07/29/2011   h/o   . Headache    history of migraines  . History of blood transfusion   . Hyperglycemia 05/25/2017  . Hypertension   . Iron deficiency anemia   . Leg edema   . Obese   . PCOS  (polycystic ovarian syndrome)   . Right shoulder pain 05/25/2017    PAST SURGICAL HISTORY: Past Surgical History:  Procedure Laterality Date  . COLONOSCOPY    . DILATION AND CURETTAGE OF UTERUS  1987  . NASAL SEPTOPLASTY W/ TURBINOPLASTY  03/23/2012   Procedure: NASAL SEPTOPLASTY WITH TURBINATE REDUCTION;  Surgeon: Jerrell Belfast, MD;  Location: [place];  Service: ENT;  Laterality: Bilateral;  . ROBOTIC ASSISTED TOTAL HYSTERECTOMY WITH SALPINGECTOMY N/A 03/17/2017   Procedure: ROBOTIC ASSISTED TOTAL HYSTERECTOMY WITH SALPINGECTOMY;  Surgeon: Bobbye Charleston, MD;  Location: [facility];  Service: Gynecology;  Laterality: N/A;  . SINUS ENDO W/FUSION  03/23/2012   Procedure: ENDOSCOPIC SINUS SURGERY WITH FUSION NAVIGATION;  Surgeon: Jerrell Belfast, MD;  Location: [place];  Service: ENT;  Laterality: Bilateral;  with fusion scan  . UPPER GI ENDOSCOPY    . WISDOM TOOTH EXTRACTION      SOCIAL HISTORY: Social History   Tobacco Use  . Smoking status: Former Smoker    Packs/day: 1.00    Years: 19.00    Pack years: 19.00    Last attempt to quit: 07/28/2003    Years since quitting: 14.3  . Smokeless tobacco: Never Used  Substance Use Topics  . Alcohol use: Yes    Comment: occasionally  . Drug use: No    FAMILY HISTORY: Family History  Problem Relation Age of Onset  . Diabetes Mother        type 2  . Hypertension Mother   . Depression Mother   . Mental illness Mother        bi-polar  . Pulmonary embolism Mother   . Obesity Mother   . Heart disease Maternal Grandfather   . Stroke Maternal Grandfather   . Other Father        drug addiction  . Depression Maternal Aunt 46       suicide attempt with pneumonia    ROS: Review of Systems  Constitutional: Positive for weight loss.  Gastrointestinal: Negative for nausea and vomiting.  Genitourinary: Negative for frequency.  Musculoskeletal:       Negative muscle weakness  Neurological:        Negative lightheadedness  Endo/Heme/Allergies: Negative for polydipsia.       Negative hypoglycemia    PHYSICAL EXAM: Blood pressure 133/78, pulse 65, temperature 98.4 F (36.9 C), temperature source Oral, height 5\' 7"  (1.702 m), weight 225 lb (102.1 kg), last menstrual period 03/03/2017, SpO2 99 %. Body mass index is 35.24 kg/m. Physical Exam  Constitutional: She is oriented to person, place, and time. She appears well-developed and well-nourished.  Cardiovascular: Normal rate.  Pulmonary/Chest: Effort normal.  Musculoskeletal: Normal range of motion.  Neurological: She is oriented to person, place, and time.  Skin: Skin is warm and dry.  Psychiatric: She has a normal mood and affect. Her behavior is normal.  Vitals reviewed.   RECENT LABS AND TESTS: BMET    Component Value Date/Time   NA 140 10/12/2017  1122   K 4.2 10/12/2017 1122   CL 101 10/12/2017 1122   CO2 27 10/12/2017 1122   GLUCOSE 96 10/12/2017 1122   GLUCOSE 80 05/25/2017 1442   BUN 14 10/12/2017 1122   CREATININE 0.92 10/12/2017 1122   CREATININE 0.99 03/31/2016 0952   CALCIUM 9.2 10/12/2017 1122   GFRNONAA 98 10/12/2017 1122   GFRNONAA 68 03/31/2016 0952   GFRAA 113 10/12/2017 1122   GFRAA 78 03/31/2016 0952   Lab Results  Component Value Date   HGBA1C 5.6 10/12/2017   HGBA1C 5.6 05/25/2017   Lab Results  Component Value Date   INSULIN 17.0 10/12/2017   CBC    Component Value Date/Time   WBC 6.0 10/12/2017 1122   WBC 6.9 05/25/2017 1442   RBC 4.47 10/12/2017 1122   RBC 4.41 05/25/2017 1442   HGB 13.6 10/12/2017 1122   HCT 40.9 10/12/2017 1122   PLT 242.0 05/25/2017 1442   MCV 92 10/12/2017 1122   MCH 30.4 10/12/2017 1122   MCH 29.8 03/05/2017 1100   MCHC 33.3 10/12/2017 1122   MCHC 33.2 05/25/2017 1442   RDW 13.6 10/12/2017 1122   LYMPHSABS 1.9 10/12/2017 1122   EOSABS 0.5 (H) 10/12/2017 1122   BASOSABS 0.0 10/12/2017 1122   Iron/TIBC/Ferritin/ %Sat    Component Value Date/Time    IRON 116 05/25/2017 1442   TIBC 397 10/29/2011 1552   IRONPCTSAT 7 (L) 10/29/2011 1552   Lipid Panel     Component Value Date/Time   CHOL 201 (H) 10/12/2017 1122   TRIG 77 10/12/2017 1122   HDL 56 10/12/2017 1122   CHOLHDL 4 05/25/2017 1442   VLDL 17.0 05/25/2017 1442   LDLCALC 130 (H) 10/12/2017 1122   Hepatic Function Panel     Component Value Date/Time   PROT 7.8 10/12/2017 1122   ALBUMIN 4.3 10/12/2017 1122   AST 22 10/12/2017 1122   ALT 25 10/12/2017 1122   ALKPHOS 56 10/12/2017 1122   BILITOT 0.4 10/12/2017 1122   BILIDIR 0.1 10/12/2013 1440   IBILI 0.3 10/12/2013 1440      Component Value Date/Time   TSH 2.600 10/12/2017 1122   TSH 2.32 05/25/2017 1442   TSH 3.24 05/19/2016 1550  Results for AYSIA, LOWDER (MRN [id number]) as of 11/14/2017 17:16  Ref. Range 10/12/2017 11:22  Vitamin D, 25-Hydroxy Latest Ref Range: 30.0 - 100.0 ng/mL 31.1    ASSESSMENT AND PLAN: Essential hypertension  Insulin resistance - Plan: metFORMIN (GLUCOPHAGE) 500 MG tablet  Vitamin D deficiency - Plan: Vitamin D, Ergocalciferol, (DRISDOL) 50000 units CAPS capsule  At risk for diabetes mellitus  Class 2 severe obesity with serious comorbidity and body mass index (BMI) of 35.0 to 35.9 in adult, unspecified obesity type ([place])  PLAN:  Hypertension We discussed sodium restriction, working on healthy weight loss, and a regular exercise program as the means to achieve improved blood pressure control. Bonetta agreed with this plan and agreed to follow up as directed. We will continue to monitor her blood pressure as well as her progress with the above lifestyle modifications. She will continue diet and exercise and will watch for signs of hypotension as she continues her lifestyle modifications. We will recheck labs in 2 months and Connie Blanchard agrees to follow up with our clinic in 2 to 3 weeks.  Insulin Resistance Connie Blanchard will continue to work on weight loss, exercise, and decreasing  simple carbohydrates in her diet to help decrease the risk of diabetes. We dicussed metformin including benefits  and risks. She was informed that eating too many simple carbohydrates or too many calories at one sitting increases the likelihood of GI side effects. Connie Blanchard agrees to continue taking metformin 500 mg q AM #30 and we will refill for 1 month. Connie Blanchard agrees to follow up with our clinic in 2 to 3 weeks as directed to monitor her progress.  Diabetes risk counselling Connie Blanchard was given extended (15 minutes) diabetes prevention counseling today. She is 47 y.o. female and has risk factors for diabetes including obesity and insulin resistance. We discussed intensive lifestyle modifications today with an emphasis on weight loss as well as increasing exercise and decreasing simple carbohydrates in her diet.  Vitamin D Deficiency Connie Blanchard was informed that low vitamin D levels contributes to fatigue and are associated with obesity, breast, and colon cancer. Connie Blanchard agrees to continue taking prescription Vit D @50 ,000 IU every week #4 and we will refill for 1 month. She will follow up for routine testing of vitamin D, at least 2-3 times per year. She was informed of the risk of over-replacement of vitamin D and agrees to not increase her dose unless she discusses this with [place] first. We will recheck labs in 2 months and Connie Blanchard agrees to follow up with our clinic in 2 to 3 weeks.  Obesity Connie Blanchard is currently in the action stage of change. As such, her goal is to continue with weight loss efforts She has agreed to follow the Category 3 plan with breakfast options and ok to substitute 4 oz cottage cheese for 2 oz Connie Blanchard has been instructed to work up to a goal of 150 minutes of combined cardio and strengthening exercise per week for weight loss and overall health benefits. We discussed the following Behavioral Modification Strategies today: increasing lean protein intake, decreasing simple  carbohydrates, no skipping meals, and holiday eating strategies    Connie Blanchard has agreed to follow up with our clinic in 2 to 3 weeks. She was informed of the importance of frequent follow up visits to maximize her success with intensive lifestyle modifications for her multiple health conditions.   OBESITY BEHAVIORAL INTERVENTION VISIT  Today's visit was # 3 out of 22.  Starting weight: 233 lbs Starting date: 10/12/17 Today's weight : 225 lbs  Today's date: 11/11/2017 Total lbs lost to date: 8 (Patients must lose 7 lbs in the first 6 months to continue with counseling)   ASK: We discussed the diagnosis of obesity with Connie Blanchard today and Connie Blanchard agreed to give [place] permission to discuss obesity behavioral modification therapy today.  ASSESS: Connie Blanchard has the diagnosis of obesity and her BMI today is 35.23 Theo is in the action stage of change   ADVISE: Jodelle was educated on the multiple health risks of obesity as well as the benefit of weight loss to improve her health. She was advised of the need for long term treatment and the importance of lifestyle modifications.  AGREE: Multiple dietary modification options and treatment options were discussed and  Sheza agreed to the above obesity treatment plan.  I, Trixie Dredge, am acting as transcriptionist for Dennard Nip, MD  I have reviewed the above documentation for accuracy and completeness, and I agree with the above. -Dennard Nip, MD

## 2017-12-02 ENCOUNTER — Ambulatory Visit (INDEPENDENT_AMBULATORY_CARE_PROVIDER_SITE_OTHER): Payer: 59 | Admitting: Family Medicine

## 2017-12-02 VITALS — BP 115/72 | HR 62 | Temp 98.2°F | Ht 67.0 in | Wt 221.0 lb

## 2017-12-02 DIAGNOSIS — E669 Obesity, unspecified: Secondary | ICD-10-CM | POA: Diagnosis not present

## 2017-12-02 DIAGNOSIS — I10 Essential (primary) hypertension: Secondary | ICD-10-CM | POA: Diagnosis not present

## 2017-12-02 DIAGNOSIS — Z6834 Body mass index (BMI) 34.0-34.9, adult: Secondary | ICD-10-CM

## 2017-12-02 NOTE — Progress Notes (Signed)
Office: [phone]  /  Fax: [phone]   HPI:   Chief Complaint: OBESITY Connie Blanchard is here to discuss her progress with her obesity treatment plan. She is on the Category 3 plan, with breakfast options and ok to substitute 4 oz cottage cheese for 2 oz and is following her eating plan approximately 60 % of the time. She states she is walking for 30 minutes 1-2 times per week. Connie Blanchard had a GI issue recently and off track with diet but did well with portion control and smarter food choices. She is back on Category 3 plan and doing well. Hunger is controlled, minimal sabotage at home and at work.  Her weight is 221 lb (100.2 kg) today and has had a weight loss of 4 pounds over a period of 3 weeks since her last visit. She has lost 12 lbs since starting treatment with [place].  Hypertension Connie Blanchard is a 47 y.o. female with hypertension. Connie Blanchard's blood pressure has decreased and she has increased lightheadedness, so we discontinued Maxzide. Her blood pressure is still controlled. She is working weight loss to help control her blood pressure with the goal of decreasing her risk of heart attack and stroke. Connie Blanchard's blood pressure is currently controlled.  ALLERGIES: Allergies  Allergen Reactions  . Penicillins Rash    Has patient had a PCN reaction causing immediate rash, facial/tongue/throat swelling, SOB or lightheadedness with hypotension: Unknown Has patient had a PCN reaction causing severe rash involving mucus membranes or skin necrosis: Unknown Has patient had a PCN reaction that required hospitalization: No Has patient had a PCN reaction occurring within the last 10 years: No Childhood allergy  If all of the above answers are "NO", then may proceed with Cephalosporin use.   . Sulfa Antibiotics Rash    MEDICATIONS: Current Outpatient Medications on File Prior to Visit  Medication Sig Dispense Refill  . b complex vitamins tablet Take 1 tablet by mouth daily.    .  cetirizine (ZYRTEC) 10 MG tablet Take 10 mg by mouth daily as needed for allergies.    Marland Kitchen esomeprazole (NEXIUM) 40 MG capsule TAKE 1 CAPSULE BY MOUTH DAILY BEFORE BREAKFAST. 90 capsule 0  . metFORMIN (GLUCOPHAGE) 500 MG tablet Take 1 tablet (500 mg total) by mouth daily with breakfast. 30 tablet 0  . Multiple Vitamin (MULTIVITAMIN) tablet Take 1 tablet by mouth daily.      . Probiotic Product (RESTORA) CAPS Take 1 capsule by mouth daily.  12  . Vitamin D, Ergocalciferol, (DRISDOL) 50000 units CAPS capsule Take 1 capsule (50,000 Units total) by mouth every 7 (seven) days. 4 capsule 0   No current facility-administered medications on file prior to visit.     PAST MEDICAL HISTORY: Past Medical History:  Diagnosis Date  . Acute bronchitis 06/24/2012  . Acute bronchitis with asthma 06/24/2012  . Allergic state 10/15/2013  . Anxiety   . Back pain 10/15/2013  . Chronic sinusitis 02/2012   current runny nose of clear drainage  . Constipation   . Dental crowns present   . Depression   . GERD (gastroesophageal reflux disease)   . H/O gestational diabetes mellitus, not currently pregnant 07/29/2011   h/o   . Headache    history of migraines  . History of blood transfusion   . Hyperglycemia 05/25/2017  . Hypertension   . Iron deficiency anemia   . Leg edema   . Obese   . PCOS (polycystic ovarian syndrome)   . Right shoulder pain 05/25/2017  PAST SURGICAL HISTORY: Past Surgical History:  Procedure Laterality Date  . COLONOSCOPY    . DILATION AND CURETTAGE OF UTERUS  1987  . NASAL SEPTOPLASTY W/ TURBINOPLASTY  03/23/2012   Procedure: NASAL SEPTOPLASTY WITH TURBINATE REDUCTION;  Surgeon: Jerrell Belfast, MD;  Location: [facility] [facility];  Service: ENT;  Laterality: Bilateral;  . ROBOTIC ASSISTED TOTAL HYSTERECTOMY WITH SALPINGECTOMY N/A 03/17/2017   Procedure: ROBOTIC ASSISTED TOTAL HYSTERECTOMY WITH SALPINGECTOMY;  Surgeon: Bobbye Charleston, MD;  Location: [facility];  Service:  Gynecology;  Laterality: N/A;  . SINUS ENDO W/FUSION  03/23/2012   Procedure: ENDOSCOPIC SINUS SURGERY WITH FUSION NAVIGATION;  Surgeon: Jerrell Belfast, MD;  Location: [place];  Service: ENT;  Laterality: Bilateral;  with fusion scan  . UPPER GI ENDOSCOPY    . WISDOM TOOTH EXTRACTION      SOCIAL HISTORY: Social History   Tobacco Use  . Smoking status: Former Smoker    Packs/day: 1.00    Years: 19.00    Pack years: 19.00    Last attempt to quit: 07/28/2003    Years since quitting: 14.3  . Smokeless tobacco: Never Used  Substance Use Topics  . Alcohol use: Yes    Comment: occasionally  . Drug use: No    FAMILY HISTORY: Family History  Problem Relation Age of Onset  . Diabetes Mother        type 2  . Hypertension Mother   . Depression Mother   . Mental illness Mother        bi-polar  . Pulmonary embolism Mother   . Obesity Mother   . Heart disease Maternal Grandfather   . Stroke Maternal Grandfather   . Other Father        drug addiction  . Depression Maternal Aunt 46       suicide attempt with pneumonia    ROS: Review of Systems  Constitutional: Positive for weight loss.  Neurological:       + lightheadedness    PHYSICAL EXAM: Blood pressure 115/72, pulse 62, temperature 98.2 F (36.8 C), temperature source Oral, height 5\' 7"  (1.702 m), weight 221 lb (100.2 kg), last menstrual period 03/03/2017, SpO2 98 %. Body mass index is 34.61 kg/m. Physical Exam  Constitutional: She is oriented to person, place, and time. She appears well-developed and well-nourished.  Cardiovascular: Normal rate.  Pulmonary/Chest: Effort normal.  Musculoskeletal: Normal range of motion.  Neurological: She is oriented to person, place, and time.  Skin: Skin is warm and dry.  Psychiatric: She has a normal mood and affect. Her behavior is normal.  Vitals reviewed.   RECENT LABS AND TESTS: BMET    Component Value Date/Time   NA 140 10/12/2017 1122   K 4.2  10/12/2017 1122   CL 101 10/12/2017 1122   CO2 27 10/12/2017 1122   GLUCOSE 96 10/12/2017 1122   GLUCOSE 80 05/25/2017 1442   BUN 14 10/12/2017 1122   CREATININE 0.92 10/12/2017 1122   CREATININE 0.99 03/31/2016 0952   CALCIUM 9.2 10/12/2017 1122   GFRNONAA 98 10/12/2017 1122   GFRNONAA 68 03/31/2016 0952   GFRAA 113 10/12/2017 1122   GFRAA 78 03/31/2016 0952   Lab Results  Component Value Date   HGBA1C 5.6 10/12/2017   HGBA1C 5.6 05/25/2017   Lab Results  Component Value Date   INSULIN 17.0 10/12/2017   CBC    Component Value Date/Time   WBC 6.0 10/12/2017 1122   WBC 6.9 05/25/2017 1442   RBC 4.47  10/12/2017 1122   RBC 4.41 05/25/2017 1442   HGB 13.6 10/12/2017 1122   HCT 40.9 10/12/2017 1122   PLT 242.0 05/25/2017 1442   MCV 92 10/12/2017 1122   MCH 30.4 10/12/2017 1122   MCH 29.8 03/05/2017 1100   MCHC 33.3 10/12/2017 1122   MCHC 33.2 05/25/2017 1442   RDW 13.6 10/12/2017 1122   LYMPHSABS 1.9 10/12/2017 1122   EOSABS 0.5 (H) 10/12/2017 1122   BASOSABS 0.0 10/12/2017 1122   Iron/TIBC/Ferritin/ %Sat    Component Value Date/Time   IRON 116 05/25/2017 1442   TIBC 397 10/29/2011 1552   IRONPCTSAT 7 (L) 10/29/2011 1552   Lipid Panel     Component Value Date/Time   CHOL 201 (H) 10/12/2017 1122   TRIG 77 10/12/2017 1122   HDL 56 10/12/2017 1122   CHOLHDL 4 05/25/2017 1442   VLDL 17.0 05/25/2017 1442   LDLCALC 130 (H) 10/12/2017 1122   Hepatic Function Panel     Component Value Date/Time   PROT 7.8 10/12/2017 1122   ALBUMIN 4.3 10/12/2017 1122   AST 22 10/12/2017 1122   ALT 25 10/12/2017 1122   ALKPHOS 56 10/12/2017 1122   BILITOT 0.4 10/12/2017 1122   BILIDIR 0.1 10/12/2013 1440   IBILI 0.3 10/12/2013 1440      Component Value Date/Time   TSH 2.600 10/12/2017 1122   TSH 2.32 05/25/2017 1442   TSH 3.24 05/19/2016 1550    ASSESSMENT AND PLAN: Essential hypertension  Class 1 obesity with serious comorbidity and body mass index (BMI) of 34.0 to  34.9 in adult, unspecified obesity type  PLAN:  Hypertension We discussed sodium restriction, working on healthy weight loss, and a regular exercise program as the means to achieve improved blood pressure control. Connie Blanchard agreed with this plan and agreed to follow up as directed. We will continue to monitor her blood pressure as well as her progress with the above lifestyle modifications. Connie Blanchard agrees to discontinue Maxzide and continue with diet, exercise, and increase H20. She will watch for signs of hypotension as she continues her lifestyle modifications. Maxzide agrees to follow up with our clinic in 3 weeks.  We spent > than 50% of the 15 minute visit on the counseling as documented in the note.  Obesity Connie Blanchard is currently in the action stage of change. As such, her goal is to continue with weight loss efforts She has agreed to follow the Category 3 plan Connie Blanchard has been instructed to work up to a goal of 150 minutes of combined cardio and strengthening exercise per week for weight loss and overall health benefits. We discussed the following Behavioral Modification Strategies today: increase H20 intake   Leo has agreed to follow up with our clinic in 3 weeks. She was informed of the importance of frequent follow up visits to maximize her success with intensive lifestyle modifications for her multiple health conditions.   OBESITY BEHAVIORAL INTERVENTION VISIT  Today's visit was # 4 out of 22.  Starting weight: 233 lbs Starting date: 10/12/17 Today's weight : 221 lbs Today's date: 12/02/2017 Total lbs lost to date: 12 (Patients must lose 7 lbs in the first 6 months to continue with counseling)   ASK: We discussed the diagnosis of obesity with Connie Blanchard today and Connie Blanchard agreed to give [place] permission to discuss obesity behavioral modification therapy today.  ASSESS: Connie Blanchard has the diagnosis of obesity and her BMI today is 34.61 Connie Blanchard is in the action  stage of change  ADVISE: Connie Blanchard was educated on the multiple health risks of obesity as well as the benefit of weight loss to improve her health. She was advised of the need for long term treatment and the importance of lifestyle modifications.  AGREE: Multiple dietary modification options and treatment options were discussed and  Connie Blanchard agreed to the above obesity treatment plan.  I, Trixie Dredge, am acting as transcriptionist for Dennard Nip, MD  I have reviewed the above documentation for accuracy and completeness, and I agree with the above. -Dennard Nip, MD

## 2017-12-16 ENCOUNTER — Other Ambulatory Visit (INDEPENDENT_AMBULATORY_CARE_PROVIDER_SITE_OTHER): Payer: Self-pay | Admitting: Family Medicine

## 2017-12-16 DIAGNOSIS — E8881 Metabolic syndrome: Secondary | ICD-10-CM

## 2017-12-22 ENCOUNTER — Ambulatory Visit (INDEPENDENT_AMBULATORY_CARE_PROVIDER_SITE_OTHER): Payer: 59 | Admitting: Physician Assistant

## 2017-12-22 VITALS — BP 120/80 | HR 65 | Temp 98.1°F | Ht 67.0 in | Wt 221.0 lb

## 2017-12-22 DIAGNOSIS — E8881 Metabolic syndrome: Secondary | ICD-10-CM

## 2017-12-22 DIAGNOSIS — E559 Vitamin D deficiency, unspecified: Secondary | ICD-10-CM | POA: Diagnosis not present

## 2017-12-22 DIAGNOSIS — Z6834 Body mass index (BMI) 34.0-34.9, adult: Secondary | ICD-10-CM

## 2017-12-22 DIAGNOSIS — E669 Obesity, unspecified: Secondary | ICD-10-CM

## 2017-12-22 DIAGNOSIS — Z9189 Other specified personal risk factors, not elsewhere classified: Secondary | ICD-10-CM | POA: Diagnosis not present

## 2017-12-22 MED ORDER — METFORMIN HCL 500 MG PO TABS: 500.0000 mg | ORAL_TABLET | Freq: Every day | ORAL | 0 refills | Status: DC

## 2017-12-22 MED ORDER — VITAMIN D (ERGOCALCIFEROL) 1.25 MG (50000 UNIT) PO CAPS: 50000.0000 [IU] | ORAL_CAPSULE | ORAL | 0 refills | Status: DC

## 2017-12-22 NOTE — Progress Notes (Signed)
Office: [phone]  /  Fax: [phone]   HPI:   Chief Complaint: OBESITY Connie Blanchard is here to discuss her progress with her obesity treatment plan. She is on the Category 3 plan and is following her eating plan approximately 75 % of the time. She states she is walking for 30 minutes 2 times per week. Donnika maintained her weight. She states she had increase in celebration eating but made better food choices and controlled her portions.  Her weight is 221 lb (100.2 kg) today and has not lost weight since her last visit. She has lost 12 lbs since starting treatment with [place].  Insulin Resistance Connie Blanchard has a diagnosis of insulin resistance based on her elevated fasting insulin level >5. Although Connie Blanchard's blood glucose readings are still under good control, insulin resistance puts her at greater risk of metabolic syndrome and diabetes. She is taking metformin currently and continues to work on diet and exercise to decrease risk of diabetes. She denies polyphagia.  At risk for diabetes Connie Blanchard is at higher than average risk for developing diabetes due to her obesity and insulin resistance. She currently denies polyuria or polydipsia.  Vitamin D Deficiency Connie Blanchard has a diagnosis of vitamin D deficiency. She is currently taking prescription Vit D and denies nausea, vomiting or muscle weakness.  ALLERGIES: Allergies  Allergen Reactions  . Penicillins Rash    Has patient had a PCN reaction causing immediate rash, facial/tongue/throat swelling, SOB or lightheadedness with hypotension: Unknown Has patient had a PCN reaction causing severe rash involving mucus membranes or skin necrosis: Unknown Has patient had a PCN reaction that required hospitalization: No Has patient had a PCN reaction occurring within the last 10 years: No Childhood allergy  If all of the above answers are "NO", then may proceed with Cephalosporin use.   . Sulfa Antibiotics Rash    MEDICATIONS: Current  Outpatient Medications on File Prior to Visit  Medication Sig Dispense Refill  . b complex vitamins tablet Take 1 tablet by mouth daily.    . cetirizine (ZYRTEC) 10 MG tablet Take 10 mg by mouth daily as needed for allergies.    Marland Kitchen esomeprazole (NEXIUM) 40 MG capsule TAKE 1 CAPSULE BY MOUTH DAILY BEFORE BREAKFAST. 90 capsule 0  . metFORMIN (GLUCOPHAGE) 500 MG tablet Take 1 tablet (500 mg total) by mouth daily with breakfast. 30 tablet 0  . Multiple Vitamin (MULTIVITAMIN) tablet Take 1 tablet by mouth daily.      . Probiotic Product (RESTORA) CAPS Take 1 capsule by mouth daily.  12  . Vitamin D, Ergocalciferol, (DRISDOL) 50000 units CAPS capsule Take 1 capsule (50,000 Units total) by mouth every 7 (seven) days. 4 capsule 0   No current facility-administered medications on file prior to visit.     PAST MEDICAL HISTORY: Past Medical History:  Diagnosis Date  . Acute bronchitis 06/24/2012  . Acute bronchitis with asthma 06/24/2012  . Allergic state 10/15/2013  . Anxiety   . Back pain 10/15/2013  . Chronic sinusitis 02/2012   current runny nose of clear drainage  . Constipation   . Dental crowns present   . Depression   . GERD (gastroesophageal reflux disease)   . H/O gestational diabetes mellitus, not currently pregnant 07/29/2011   h/o   . Headache    history of migraines  . History of blood transfusion   . Hyperglycemia 05/25/2017  . Hypertension   . Iron deficiency anemia   . Leg edema   . Obese   . PCOS (  polycystic ovarian syndrome)   . Right shoulder pain 05/25/2017    PAST SURGICAL HISTORY: Past Surgical History:  Procedure Laterality Date  . COLONOSCOPY    . DILATION AND CURETTAGE OF UTERUS  1987  . NASAL SEPTOPLASTY W/ TURBINOPLASTY  03/23/2012   Procedure: NASAL SEPTOPLASTY WITH TURBINATE REDUCTION;  Surgeon: Jerrell Belfast, MD;  Location: [facility];  Service: ENT;  Laterality: Bilateral;  . ROBOTIC ASSISTED TOTAL HYSTERECTOMY WITH SALPINGECTOMY N/A  03/17/2017   Procedure: ROBOTIC ASSISTED TOTAL HYSTERECTOMY WITH SALPINGECTOMY;  Surgeon: Bobbye Charleston, MD;  Location: [facility];  Service: Gynecology;  Laterality: N/A;  . SINUS ENDO W/FUSION  03/23/2012   Procedure: ENDOSCOPIC SINUS SURGERY WITH FUSION NAVIGATION;  Surgeon: Jerrell Belfast, MD;  Location: [facility];  Service: ENT;  Laterality: Bilateral;  with fusion scan  . UPPER GI ENDOSCOPY    . WISDOM TOOTH EXTRACTION      SOCIAL HISTORY: Social History   Tobacco Use  . Smoking status: Former Smoker    Packs/day: 1.00    Years: 19.00    Pack years: 19.00    Last attempt to quit: 07/28/2003    Years since quitting: 14.4  . Smokeless tobacco: Never Used  Substance Use Topics  . Alcohol use: Yes    Comment: occasionally  . Drug use: No    FAMILY HISTORY: Family History  Problem Relation Age of Onset  . Diabetes Mother        type 2  . Hypertension Mother   . Depression Mother   . Mental illness Mother        bi-polar  . Pulmonary embolism Mother   . Obesity Mother   . Heart disease Maternal Grandfather   . Stroke Maternal Grandfather   . Other Father        drug addiction  . Depression Maternal Aunt 46       suicide attempt with pneumonia    ROS: Review of Systems  Constitutional: Negative for weight loss.  Gastrointestinal: Negative for nausea and vomiting.  Genitourinary: Negative for frequency.  Musculoskeletal:       Negative muscle weakness  Endo/Heme/Allergies: Negative for polydipsia.       Negative polyphagia    PHYSICAL EXAM: Blood pressure 120/80, pulse 65, temperature 98.1 F (36.7 C), temperature source Oral, height 5\' 7"  (1.702 m), weight 221 lb (100.2 kg), last menstrual period 03/03/2017, SpO2 98 %. Body mass index is 34.61 kg/m. Physical Exam  Constitutional: She is oriented to person, place, and time. She appears well-developed and well-nourished.  Cardiovascular: Normal rate.  Pulmonary/Chest: Effort normal.    Musculoskeletal: Normal range of motion.  Neurological: She is oriented to person, place, and time.  Skin: Skin is warm and dry.  Psychiatric: She has a normal mood and affect. Her behavior is normal.  Vitals reviewed.   RECENT LABS AND TESTS: BMET    Component Value Date/Time   NA 140 10/12/2017 1122   K 4.2 10/12/2017 1122   CL 101 10/12/2017 1122   CO2 27 10/12/2017 1122   GLUCOSE 96 10/12/2017 1122   GLUCOSE 80 05/25/2017 1442   BUN 14 10/12/2017 1122   CREATININE 0.92 10/12/2017 1122   CREATININE 0.99 03/31/2016 0952   CALCIUM 9.2 10/12/2017 1122   GFRNONAA 98 10/12/2017 1122   GFRNONAA 68 03/31/2016 0952   GFRAA 113 10/12/2017 1122   GFRAA 78 03/31/2016 0952   Lab Results  Component Value Date   HGBA1C 5.6 10/12/2017   HGBA1C  5.6 05/25/2017   Lab Results  Component Value Date   INSULIN 17.0 10/12/2017   CBC    Component Value Date/Time   WBC 6.0 10/12/2017 1122   WBC 6.9 05/25/2017 1442   RBC 4.47 10/12/2017 1122   RBC 4.41 05/25/2017 1442   HGB 13.6 10/12/2017 1122   HCT 40.9 10/12/2017 1122   PLT 242.0 05/25/2017 1442   MCV 92 10/12/2017 1122   MCH 30.4 10/12/2017 1122   MCH 29.8 03/05/2017 1100   MCHC 33.3 10/12/2017 1122   MCHC 33.2 05/25/2017 1442   RDW 13.6 10/12/2017 1122   LYMPHSABS 1.9 10/12/2017 1122   EOSABS 0.5 (H) 10/12/2017 1122   BASOSABS 0.0 10/12/2017 1122   Iron/TIBC/Ferritin/ %Sat    Component Value Date/Time   IRON 116 05/25/2017 1442   TIBC 397 10/29/2011 1552   IRONPCTSAT 7 (L) 10/29/2011 1552   Lipid Panel     Component Value Date/Time   CHOL 201 (H) 10/12/2017 1122   TRIG 77 10/12/2017 1122   HDL 56 10/12/2017 1122   CHOLHDL 4 05/25/2017 1442   VLDL 17.0 05/25/2017 1442   LDLCALC 130 (H) 10/12/2017 1122   Hepatic Function Panel     Component Value Date/Time   PROT 7.8 10/12/2017 1122   ALBUMIN 4.3 10/12/2017 1122   AST 22 10/12/2017 1122   ALT 25 10/12/2017 1122   ALKPHOS 56 10/12/2017 1122   BILITOT 0.4  10/12/2017 1122   BILIDIR 0.1 10/12/2013 1440   IBILI 0.3 10/12/2013 1440      Component Value Date/Time   TSH 2.600 10/12/2017 1122   TSH 2.32 05/25/2017 1442   TSH 3.24 05/19/2016 1550  Results for AYVA, VEILLEUX (MRN [id number]) as of 12/22/2017 13:02  Ref. Range 10/12/2017 11:22  Vitamin D, 25-Hydroxy Latest Ref Range: 30.0 - 100.0 ng/mL 31.1    ASSESSMENT AND PLAN: Insulin resistance - Plan: metFORMIN (GLUCOPHAGE) 500 MG tablet  Vitamin D deficiency - Plan: Vitamin D, Ergocalciferol, (DRISDOL) 50000 units CAPS capsule  At risk for diabetes mellitus  Class 1 obesity with serious comorbidity and body mass index (BMI) of 34.0 to 34.9 in adult, unspecified obesity type  PLAN:  Insulin Resistance Giana will continue to work on weight loss, exercise, and decreasing simple carbohydrates in her diet to help decrease the risk of diabetes. We dicussed metformin including benefits and risks. She was informed that eating too many simple carbohydrates or too many calories at one sitting increases the likelihood of GI side effects. Analya agrees to continue taking metformin 500 mg q AM #30 and we will refill for 1 month. Henley agrees to follow up with our clinic in 2 weeks as directed to monitor her progress.  Diabetes risk counselling Yvonne was given extended (15 minutes) diabetes prevention counseling today. She is 47 y.o. female and has risk factors for diabetes including obesity and insulin resistance. We discussed intensive lifestyle modifications today with an emphasis on weight loss as well as increasing exercise and decreasing simple carbohydrates in her diet.  Vitamin D Deficiency Mishaal was informed that low vitamin D levels contributes to fatigue and are associated with obesity, breast, and colon cancer. Altagracia agrees to continue taking prescription Vit D @50 ,000 IU every week #4 and we will refill for 1 month. She will follow up for routine testing of vitamin D, at  least 2-3 times per year. She was informed of the risk of over-replacement of vitamin D and agrees to not increase her dose unless she discusses  this with [place] first. Metzli agrees to follow up with our clinic in 2 weeks.  Obesity Glenda is currently in the action stage of change. As such, her goal is to continue with weight loss efforts She has agreed to follow the Category 3 plan Anisia has been instructed to work up to a goal of 150 minutes of combined cardio and strengthening exercise per week for weight loss and overall health benefits. We discussed the following Behavioral Modification Strategies today: increasing lean protein intake and work on meal planning and easy cooking plans   Charlot has agreed to follow up with our clinic in 2 weeks. She was informed of the importance of frequent follow up visits to maximize her success with intensive lifestyle modifications for her multiple health conditions.   OBESITY BEHAVIORAL INTERVENTION VISIT  Today's visit was # 5 out of 22.  Starting weight: 233 lbs Starting date: 10/12/17 Today's weight : 221 lbs Today's date: 12/22/2017 Total lbs lost to date: 12 (Patients must lose 7 lbs in the first 6 months to continue with counseling)   ASK: We discussed the diagnosis of obesity with [name] today and Ermel agreed to give [place] permission to discuss obesity behavioral modification therapy today.  ASSESS: Kijuana has the diagnosis of obesity and her BMI today is 34.61 Giliana is in the action stage of change   ADVISE: Ramsie was educated on the multiple health risks of obesity as well as the benefit of weight loss to improve her health. She was advised of the need for long term treatment and the importance of lifestyle modifications.  AGREE: Multiple dietary modification options and treatment options were discussed and  Maleiah agreed to the above obesity treatment plan.   Wilhemena Durie, am acting as  transcriptionist for Lacy Duverney, PA-C I, Lacy Duverney [facility], have reviewed this note and agree with its content

## 2018-01-11 ENCOUNTER — Ambulatory Visit (INDEPENDENT_AMBULATORY_CARE_PROVIDER_SITE_OTHER): Payer: 59 | Admitting: Physician Assistant

## 2018-01-11 VITALS — BP 143/84 | HR 63 | Temp 98.1°F | Ht 67.0 in | Wt 218.0 lb

## 2018-01-11 DIAGNOSIS — E8881 Metabolic syndrome: Secondary | ICD-10-CM | POA: Diagnosis not present

## 2018-01-11 DIAGNOSIS — Z6834 Body mass index (BMI) 34.0-34.9, adult: Secondary | ICD-10-CM | POA: Diagnosis not present

## 2018-01-11 DIAGNOSIS — E559 Vitamin D deficiency, unspecified: Secondary | ICD-10-CM

## 2018-01-11 DIAGNOSIS — E669 Obesity, unspecified: Secondary | ICD-10-CM | POA: Diagnosis not present

## 2018-01-11 DIAGNOSIS — E88819 Insulin resistance, unspecified: Secondary | ICD-10-CM

## 2018-01-11 DIAGNOSIS — Z9189 Other specified personal risk factors, not elsewhere classified: Secondary | ICD-10-CM

## 2018-01-11 MED ORDER — VITAMIN D (ERGOCALCIFEROL) 1.25 MG (50000 UNIT) PO CAPS: 50000.0000 [IU] | ORAL_CAPSULE | ORAL | 0 refills | Status: DC

## 2018-01-11 MED ORDER — METFORMIN HCL 500 MG PO TABS: 500.0000 mg | ORAL_TABLET | Freq: Every day | ORAL | 0 refills | Status: DC

## 2018-01-11 NOTE — Progress Notes (Signed)
Office: [phone]  /  Fax: [phone]   HPI:   Chief Complaint: OBESITY Connie Blanchard is here to discuss her progress with her obesity treatment plan. She is on the Category 3 plan and is following her eating plan approximately 75 % of the time. She states she is walking for 30 minutes 1-2 times per week. Connie Blanchard continues to do well with weight loss. She had some celebration eating but managed to make mindful food choices and controlled her portions.  Her weight is 218 lb (98.9 kg) today and has had a weight loss of 3 pounds over a period of 3 weeks since her last visit. She has lost 15 lbs since starting treatment with [place].  Vitamin D Deficiency Connie Blanchard has a diagnosis of vitamin D deficiency. She is currently taking prescription Vit D and denies nausea, vomiting or muscle weakness.  Insulin Resistance Connie Blanchard has a diagnosis of insulin resistance based on her elevated fasting insulin level >5. Although Connie Blanchard's blood glucose readings are still under good control, insulin resistance puts her at greater risk of metabolic syndrome and diabetes. She is taking metformin currently and continues to work on diet and exercise to decrease risk of diabetes. She denies polyphagia.  At risk for diabetes Connie Blanchard is at higher than average risk for developing diabetes due to her obesity and insulin resistance. She currently denies polyuria or polydipsia.  ALLERGIES: Allergies  Allergen Reactions  . Penicillins Rash    Has patient had a PCN reaction causing immediate rash, facial/tongue/throat swelling, SOB or lightheadedness with hypotension: Unknown Has patient had a PCN reaction causing severe rash involving mucus membranes or skin necrosis: Unknown Has patient had a PCN reaction that required hospitalization: No Has patient had a PCN reaction occurring within the last 10 years: No Childhood allergy  If all of the above answers are "NO", then may proceed with Cephalosporin use.   . Sulfa  Antibiotics Rash    MEDICATIONS: Current Outpatient Medications on File Prior to Visit  Medication Sig Dispense Refill  . b complex vitamins tablet Take 1 tablet by mouth daily.    . cetirizine (ZYRTEC) 10 MG tablet Take 10 mg by mouth daily as needed for allergies.    Marland Kitchen esomeprazole (NEXIUM) 40 MG capsule TAKE 1 CAPSULE BY MOUTH DAILY BEFORE BREAKFAST. 90 capsule 0  . Multiple Vitamin (MULTIVITAMIN) tablet Take 1 tablet by mouth daily.      . Probiotic Product (RESTORA) CAPS Take 1 capsule by mouth daily.  12   No current facility-administered medications on file prior to visit.     PAST MEDICAL HISTORY: Past Medical History:  Diagnosis Date  . Acute bronchitis 06/24/2012  . Acute bronchitis with asthma 06/24/2012  . Allergic state 10/15/2013  . Anxiety   . Back pain 10/15/2013  . Chronic sinusitis 02/2012   current runny nose of clear drainage  . Constipation   . Dental crowns present   . Depression   . GERD (gastroesophageal reflux disease)   . H/O gestational diabetes mellitus, not currently pregnant 07/29/2011   h/o   . Headache    history of migraines  . History of blood transfusion   . Hyperglycemia 05/25/2017  . Hypertension   . Iron deficiency anemia   . Leg edema   . Obese   . PCOS (polycystic ovarian syndrome)   . Right shoulder pain 05/25/2017    PAST SURGICAL HISTORY: Past Surgical History:  Procedure Laterality Date  . COLONOSCOPY    . DILATION AND CURETTAGE  OF UTERUS  1987  . NASAL SEPTOPLASTY W/ TURBINOPLASTY  03/23/2012   Procedure: NASAL SEPTOPLASTY WITH TURBINATE REDUCTION;  Surgeon: Jerrell Belfast, MD;  Location: [facility];  Service: ENT;  Laterality: Bilateral;  . ROBOTIC ASSISTED TOTAL HYSTERECTOMY WITH SALPINGECTOMY N/A 03/17/2017   Procedure: ROBOTIC ASSISTED TOTAL HYSTERECTOMY WITH SALPINGECTOMY;  Surgeon: Bobbye Charleston, MD;  Location: [facility];  Service: Gynecology;  Laterality: N/A;  . SINUS ENDO W/FUSION  03/23/2012    Procedure: ENDOSCOPIC SINUS SURGERY WITH FUSION NAVIGATION;  Surgeon: Jerrell Belfast, MD;  Location: [facility];  Service: ENT;  Laterality: Bilateral;  with fusion scan  . UPPER GI ENDOSCOPY    . WISDOM TOOTH EXTRACTION      SOCIAL HISTORY: Social History   Tobacco Use  . Smoking status: Former Smoker    Packs/day: 1.00    Years: 19.00    Pack years: 19.00    Last attempt to quit: 07/28/2003    Years since quitting: 14.4  . Smokeless tobacco: Never Used  Substance Use Topics  . Alcohol use: Yes    Comment: occasionally  . Drug use: No    FAMILY HISTORY: Family History  Problem Relation Age of Onset  . Diabetes Mother        type 2  . Hypertension Mother   . Depression Mother   . Mental illness Mother        bi-polar  . Pulmonary embolism Mother   . Obesity Mother   . Heart disease Maternal Grandfather   . Stroke Maternal Grandfather   . Other Father        drug addiction  . Depression Maternal Aunt 46       suicide attempt with pneumonia    ROS: Review of Systems  Constitutional: Positive for weight loss.  Gastrointestinal: Negative for nausea and vomiting.  Genitourinary: Negative for frequency.  Musculoskeletal:       Negative muscle weakness  Endo/Heme/Allergies: Negative for polydipsia.       Negative polyphagia    PHYSICAL EXAM: Blood pressure (!) 143/84, pulse 63, temperature 98.1 F (36.7 C), temperature source Oral, height 5\' 7"  (1.702 m), weight 218 lb (98.9 kg), last menstrual period 03/03/2017, SpO2 97 %. Body mass index is 34.14 kg/m. Physical Exam  Constitutional: She is oriented to person, place, and time. She appears well-developed and well-nourished.  Cardiovascular: Normal rate.  Pulmonary/Chest: Effort normal.  Musculoskeletal: Normal range of motion.  Neurological: She is oriented to person, place, and time.  Skin: Skin is warm and dry.  Psychiatric: She has a normal mood and affect. Her behavior is normal.  Vitals  reviewed.   RECENT LABS AND TESTS: BMET    Component Value Date/Time   NA 140 10/12/2017 1122   K 4.2 10/12/2017 1122   CL 101 10/12/2017 1122   CO2 27 10/12/2017 1122   GLUCOSE 96 10/12/2017 1122   GLUCOSE 80 05/25/2017 1442   BUN 14 10/12/2017 1122   CREATININE 0.92 10/12/2017 1122   CREATININE 0.99 03/31/2016 0952   CALCIUM 9.2 10/12/2017 1122   GFRNONAA 98 10/12/2017 1122   GFRNONAA 68 03/31/2016 0952   GFRAA 113 10/12/2017 1122   GFRAA 78 03/31/2016 0952   Lab Results  Component Value Date   HGBA1C 5.6 10/12/2017   HGBA1C 5.6 05/25/2017   Lab Results  Component Value Date   INSULIN 17.0 10/12/2017   CBC    Component Value Date/Time   WBC 6.0 10/12/2017 1122   WBC  6.9 05/25/2017 1442   RBC 4.47 10/12/2017 1122   RBC 4.41 05/25/2017 1442   HGB 13.6 10/12/2017 1122   HCT 40.9 10/12/2017 1122   PLT 242.0 05/25/2017 1442   MCV 92 10/12/2017 1122   MCH 30.4 10/12/2017 1122   MCH 29.8 03/05/2017 1100   MCHC 33.3 10/12/2017 1122   MCHC 33.2 05/25/2017 1442   RDW 13.6 10/12/2017 1122   LYMPHSABS 1.9 10/12/2017 1122   EOSABS 0.5 (H) 10/12/2017 1122   BASOSABS 0.0 10/12/2017 1122   Iron/TIBC/Ferritin/ %Sat    Component Value Date/Time   IRON 116 05/25/2017 1442   TIBC 397 10/29/2011 1552   IRONPCTSAT 7 (L) 10/29/2011 1552   Lipid Panel     Component Value Date/Time   CHOL 201 (H) 10/12/2017 1122   TRIG 77 10/12/2017 1122   HDL 56 10/12/2017 1122   CHOLHDL 4 05/25/2017 1442   VLDL 17.0 05/25/2017 1442   LDLCALC 130 (H) 10/12/2017 1122   Hepatic Function Panel     Component Value Date/Time   PROT 7.8 10/12/2017 1122   ALBUMIN 4.3 10/12/2017 1122   AST 22 10/12/2017 1122   ALT 25 10/12/2017 1122   ALKPHOS 56 10/12/2017 1122   BILITOT 0.4 10/12/2017 1122   BILIDIR 0.1 10/12/2013 1440   IBILI 0.3 10/12/2013 1440      Component Value Date/Time   TSH 2.600 10/12/2017 1122   TSH 2.32 05/25/2017 1442   TSH 3.24 05/19/2016 1550  Results for ADDELYNN, BATTE (MRN [id number]) as of 01/11/2018 10:51  Ref. Range 10/12/2017 11:22  Vitamin D, 25-Hydroxy Latest Ref Range: 30.0 - 100.0 ng/mL 31.1    ASSESSMENT AND PLAN: Vitamin D deficiency - Plan: Vitamin D, Ergocalciferol, (DRISDOL) 50000 units CAPS capsule  Insulin resistance - Plan: metFORMIN (GLUCOPHAGE) 500 MG tablet  At risk for diabetes mellitus  Class 1 obesity with serious comorbidity and body mass index (BMI) of 34.0 to 34.9 in adult, unspecified obesity type  PLAN:  Vitamin D Deficiency Keauna was informed that low vitamin D levels contributes to fatigue and are associated with obesity, breast, and colon cancer. Disney agrees to continue taking prescription Vit D @50 ,000 IU every week #4 and we will refill for 1 month. She will follow up for routine testing of vitamin D, at least 2-3 times per year. She was informed of the risk of over-replacement of vitamin D and agrees to not increase her dose unless she discusses this with [place] first. Sascha agrees to follow up with our clinic in 2 weeks.  Insulin Resistance Barbarann will continue to work on weight loss, exercise, and decreasing simple carbohydrates in her diet to help decrease the risk of diabetes. We dicussed metformin including benefits and risks. She was informed that eating too many simple carbohydrates or too many calories at one sitting increases the likelihood of GI side effects. Wyona agrees to continue taking metformin 500 mg q AM #30 and we will refill for 1 month. Arthella agrees to follow up with our clinic in 2 weeks as directed to monitor her progress.  Diabetes risk counselling Sarika was given extended (15 minutes) diabetes prevention counseling today. She is 47 y.o. female and has risk factors for diabetes including obesity and insulin resistance. We discussed intensive lifestyle modifications today with an emphasis on weight loss as well as increasing exercise and decreasing simple carbohydrates in her  diet.  Obesity Aarika is currently in the action stage of change. As such, her goal is to continue  with weight loss efforts She has agreed to follow the Category 3 plan Daijah has been instructed to work up to a goal of 150 minutes of combined cardio and strengthening exercise per week for weight loss and overall health benefits. We discussed the following Behavioral Modification Strategies today: increasing lean protein intake and celebration eating strategies   Licia has agreed to follow up with our clinic in 2 weeks. She was informed of the importance of frequent follow up visits to maximize her success with intensive lifestyle modifications for her multiple health conditions.   OBESITY BEHAVIORAL INTERVENTION VISIT  Today's visit was # 6 out of 22.  Starting weight: 233 lbs Starting date: 10/12/17 Today's weight : 218 lbs  Today's date: 01/11/2018 Total lbs lost to date: 15 (Patients must lose 7 lbs in the first 6 months to continue with counseling)   ASK: We discussed the diagnosis of obesity with [name] today and Harbor agreed to give [place] permission to discuss obesity behavioral modification therapy today.  ASSESS: Adasha has the diagnosis of obesity and her BMI today is 34.14 Lynesha is in the action stage of change   ADVISE: Marise was educated on the multiple health risks of obesity as well as the benefit of weight loss to improve her health. She was advised of the need for long term treatment and the importance of lifestyle modifications.  AGREE: Multiple dietary modification options and treatment options were discussed and  Adie agreed to the above obesity treatment plan.   Wilhemena Durie, am acting as transcriptionist for Lacy Duverney, PA-C I, Lacy Duverney [facility], have reviewed this note and agree with its content

## 2018-01-13 ENCOUNTER — Encounter (INDEPENDENT_AMBULATORY_CARE_PROVIDER_SITE_OTHER): Payer: Self-pay | Admitting: Physician Assistant

## 2018-01-22 ENCOUNTER — Other Ambulatory Visit: Payer: Self-pay | Admitting: Family Medicine

## 2018-01-25 ENCOUNTER — Ambulatory Visit (INDEPENDENT_AMBULATORY_CARE_PROVIDER_SITE_OTHER): Payer: 59 | Admitting: Physician Assistant

## 2018-01-25 VITALS — BP 112/73 | HR 62 | Temp 98.0°F | Ht 67.0 in | Wt 219.0 lb

## 2018-01-25 DIAGNOSIS — E669 Obesity, unspecified: Secondary | ICD-10-CM

## 2018-01-25 DIAGNOSIS — E8881 Metabolic syndrome: Secondary | ICD-10-CM | POA: Diagnosis not present

## 2018-01-25 DIAGNOSIS — E782 Mixed hyperlipidemia: Secondary | ICD-10-CM

## 2018-01-25 DIAGNOSIS — E88819 Insulin resistance, unspecified: Secondary | ICD-10-CM

## 2018-01-25 DIAGNOSIS — Z6834 Body mass index (BMI) 34.0-34.9, adult: Secondary | ICD-10-CM

## 2018-01-25 DIAGNOSIS — Z9189 Other specified personal risk factors, not elsewhere classified: Secondary | ICD-10-CM | POA: Diagnosis not present

## 2018-01-25 DIAGNOSIS — E559 Vitamin D deficiency, unspecified: Secondary | ICD-10-CM

## 2018-01-25 NOTE — Progress Notes (Signed)
Office: [phone]  /  Fax: [phone]   HPI:   Chief Complaint: OBESITY Connie Blanchard is here to discuss her progress with her obesity treatment plan. She is on the Category 3 plan and is following her eating plan approximately 70 % of the time. She states she is walking 30 minutes 1 to 2 times per week. Connie Blanchard was on vacation and had an increase in celebration eating. She is motivated to get back on track and continue with weight loss. Her weight is 219 lb (99.3 kg) today and has had a weight gain of 1 pound over a period of 2 weeks since her last visit. She has lost 14 lbs since starting treatment with [place].  Hyperlipidemia Connie Blanchard has hyperlipidemia. Connie Blanchard is not on medications and she declines medications. Connie Blanchard has been trying to improve her cholesterol levels with intensive lifestyle modification including a low saturated fat diet, exercise and weight loss. She denies any chest pain, claudication or myalgias.  Insulin Resistance Connie Blanchard has a diagnosis of insulin resistance based on her elevated fasting insulin level >5. Although Connie Blanchard's blood glucose readings are still under good control, insulin resistance puts her at greater risk of metabolic syndrome and diabetes. She is taking metformin currently and continues to work on diet and exercise to decrease risk of diabetes. She denies polyphagia.  At risk for diabetes Connie Blanchard is at higher than average risk for developing diabetes due to her obesity and insulin resistance. She currently denies polyuria or polydipsia.  Vitamin D deficiency Connie Blanchard has a diagnosis of vitamin D deficiency. She is currently taking vit D and denies nausea, vomiting or muscle weakness.  ALLERGIES: Allergies  Allergen Reactions  . Penicillins Rash    Has patient had a PCN reaction causing immediate rash, facial/tongue/throat swelling, SOB or lightheadedness with hypotension: Unknown Has patient had a PCN reaction causing severe rash involving  mucus membranes or skin necrosis: Unknown Has patient had a PCN reaction that required hospitalization: No Has patient had a PCN reaction occurring within the last 10 years: No Childhood allergy  If all of the above answers are "NO", then may proceed with Cephalosporin use.   . Sulfa Antibiotics Rash    MEDICATIONS: Current Outpatient Medications on File Prior to Visit  Medication Sig Dispense Refill  . b complex vitamins tablet Take 1 tablet by mouth daily.    . cetirizine (ZYRTEC) 10 MG tablet Take 10 mg by mouth daily as needed for allergies.    Marland Kitchen esomeprazole (NEXIUM) 40 MG capsule TAKE 1 CAPSULE BY MOUTH DAILY BEFORE BREAKFAST. 90 capsule 0  . metFORMIN (GLUCOPHAGE) 500 MG tablet Take 1 tablet (500 mg total) by mouth daily with breakfast. 30 tablet 0  . Multiple Vitamin (MULTIVITAMIN) tablet Take 1 tablet by mouth daily.      . Probiotic Product (RESTORA) CAPS Take 1 capsule by mouth daily.  12  . Vitamin D, Ergocalciferol, (DRISDOL) 50000 units CAPS capsule Take 1 capsule (50,000 Units total) by mouth every 7 (seven) days. 4 capsule 0  . triamterene-hydrochlorothiazide (MAXZIDE-25) 37.5-25 MG tablet TAKE 1 TABLET BY MOUTH EVERY DAY 90 tablet 0   No current facility-administered medications on file prior to visit.     PAST MEDICAL HISTORY: Past Medical History:  Diagnosis Date  . Acute bronchitis 06/24/2012  . Acute bronchitis with asthma 06/24/2012  . Allergic state 10/15/2013  . Anxiety   . Back pain 10/15/2013  . Chronic sinusitis 02/2012   current runny nose of clear drainage  . Constipation   .  Dental crowns present   . Depression   . GERD (gastroesophageal reflux disease)   . H/O gestational diabetes mellitus, not currently pregnant 07/29/2011   h/o   . Headache    history of migraines  . History of blood transfusion   . Hyperglycemia 05/25/2017  . Hypertension   . Iron deficiency anemia   . Leg edema   . Obese   . PCOS (polycystic ovarian syndrome)   . Right  shoulder pain 05/25/2017    PAST SURGICAL HISTORY: Past Surgical History:  Procedure Laterality Date  . COLONOSCOPY    . DILATION AND CURETTAGE OF UTERUS  1987  . NASAL SEPTOPLASTY W/ TURBINOPLASTY  03/23/2012   Procedure: NASAL SEPTOPLASTY WITH TURBINATE REDUCTION;  Surgeon: Jerrell Belfast, MD;  Location: [facility];  Service: ENT;  Laterality: Bilateral;  . ROBOTIC ASSISTED TOTAL HYSTERECTOMY WITH SALPINGECTOMY N/A 03/17/2017   Procedure: ROBOTIC ASSISTED TOTAL HYSTERECTOMY WITH SALPINGECTOMY;  Surgeon: Bobbye Charleston, MD;  Location: [facility];  Service: Gynecology;  Laterality: N/A;  . SINUS ENDO W/FUSION  03/23/2012   Procedure: ENDOSCOPIC SINUS SURGERY WITH FUSION NAVIGATION;  Surgeon: Jerrell Belfast, MD;  Location: [facility];  Service: ENT;  Laterality: Bilateral;  with fusion scan  . UPPER GI ENDOSCOPY    . WISDOM TOOTH EXTRACTION      SOCIAL HISTORY: Social History   Tobacco Use  . Smoking status: Former Smoker    Packs/day: 1.00    Years: 19.00    Pack years: 19.00    Last attempt to quit: 07/28/2003    Years since quitting: 14.5  . Smokeless tobacco: Never Used  Substance Use Topics  . Alcohol use: Yes    Comment: occasionally  . Drug use: No    FAMILY HISTORY: Family History  Problem Relation Age of Onset  . Diabetes Mother        type 2  . Hypertension Mother   . Depression Mother   . Mental illness Mother        bi-polar  . Pulmonary embolism Mother   . Obesity Mother   . Heart disease Maternal Grandfather   . Stroke Maternal Grandfather   . Other Father        drug addiction  . Depression Maternal Aunt 46       suicide attempt with pneumonia    ROS: Review of Systems  Constitutional: Negative for weight loss.  Cardiovascular: Negative for chest pain and claudication.  Gastrointestinal: Negative for nausea and vomiting.  Genitourinary: Negative for frequency.  Musculoskeletal: Negative for myalgias.       Negative  for muscle weakness  Endo/Heme/Allergies: Negative for polydipsia.       Negative for polyphagia    PHYSICAL EXAM: Blood pressure 112/73, pulse 62, temperature 98 F (36.7 C), temperature source Oral, height 5\' 7"  (1.702 m), weight 219 lb (99.3 kg), last menstrual period 03/03/2017, SpO2 99 %. Body mass index is 34.3 kg/m. Physical Exam  Constitutional: She is oriented to person, place, and time. She appears well-developed and well-nourished.  Cardiovascular: Normal rate.  Pulmonary/Chest: Effort normal.  Musculoskeletal: Normal range of motion.  Neurological: She is oriented to person, place, and time.  Skin: Skin is warm and dry.  Psychiatric: She has a normal mood and affect. Her behavior is normal.  Vitals reviewed.   RECENT LABS AND TESTS: BMET    Component Value Date/Time   NA 140 10/12/2017 1122   K 4.2 10/12/2017 1122   CL 101 10/12/2017  1122   CO2 27 10/12/2017 1122   GLUCOSE 96 10/12/2017 1122   GLUCOSE 80 05/25/2017 1442   BUN 14 10/12/2017 1122   CREATININE 0.92 10/12/2017 1122   CREATININE 0.99 03/31/2016 0952   CALCIUM 9.2 10/12/2017 1122   GFRNONAA 98 10/12/2017 1122   GFRNONAA 68 03/31/2016 0952   GFRAA 113 10/12/2017 1122   GFRAA 78 03/31/2016 0952   Lab Results  Component Value Date   HGBA1C 5.6 10/12/2017   HGBA1C 5.6 05/25/2017   Lab Results  Component Value Date   INSULIN 17.0 10/12/2017   CBC    Component Value Date/Time   WBC 6.0 10/12/2017 1122   WBC 6.9 05/25/2017 1442   RBC 4.47 10/12/2017 1122   RBC 4.41 05/25/2017 1442   HGB 13.6 10/12/2017 1122   HCT 40.9 10/12/2017 1122   PLT 242.0 05/25/2017 1442   MCV 92 10/12/2017 1122   MCH 30.4 10/12/2017 1122   MCH 29.8 03/05/2017 1100   MCHC 33.3 10/12/2017 1122   MCHC 33.2 05/25/2017 1442   RDW 13.6 10/12/2017 1122   LYMPHSABS 1.9 10/12/2017 1122   EOSABS 0.5 (H) 10/12/2017 1122   BASOSABS 0.0 10/12/2017 1122   Iron/TIBC/Ferritin/ %Sat    Component Value Date/Time   IRON 116  05/25/2017 1442   TIBC 397 10/29/2011 1552   IRONPCTSAT 7 (L) 10/29/2011 1552   Lipid Panel     Component Value Date/Time   CHOL 201 (H) 10/12/2017 1122   TRIG 77 10/12/2017 1122   HDL 56 10/12/2017 1122   CHOLHDL 4 05/25/2017 1442   VLDL 17.0 05/25/2017 1442   LDLCALC 130 (H) 10/12/2017 1122   Hepatic Function Panel     Component Value Date/Time   PROT 7.8 10/12/2017 1122   ALBUMIN 4.3 10/12/2017 1122   AST 22 10/12/2017 1122   ALT 25 10/12/2017 1122   ALKPHOS 56 10/12/2017 1122   BILITOT 0.4 10/12/2017 1122   BILIDIR 0.1 10/12/2013 1440   IBILI 0.3 10/12/2013 1440      Component Value Date/Time   TSH 2.600 10/12/2017 1122   TSH 2.32 05/25/2017 1442   TSH 3.24 05/19/2016 1550   Results for CARESSE, SEDIVY (MRN [id number]) as of 01/25/2018 11:38  Ref. Range 10/12/2017 11:22  Vitamin D, 25-Hydroxy Latest Ref Range: 30.0 - 100.0 ng/mL 31.1   ASSESSMENT AND PLAN: Hyperlipidemia, mixed  Insulin resistance  Vitamin D deficiency  At risk for diabetes mellitus  Class 1 obesity with serious comorbidity and body mass index (BMI) of 34.0 to 34.9 in adult, unspecified obesity type  PLAN:  Hyperlipidemia Connie Blanchard was informed of the American Heart Association Guidelines emphasizing intensive lifestyle modifications as the first line treatment for hyperlipidemia. We discussed many lifestyle modifications today in depth, and Jaselynn will continue to work on decreasing saturated fats such as fatty red meat, butter and many fried foods. She will also increase vegetables and lean protein in her diet and continue to work on exercise and weight loss efforts. We will check labs and she will follow up as directed.  Insulin Resistance Connie Blanchard will continue to work on weight loss, exercise, and decreasing simple carbohydrates in her diet to help decrease the risk of diabetes. We dicussed metformin including benefits and risks. She was informed that eating too many simple carbohydrates  or too many calories at one sitting increases the likelihood of GI side effects. Connie Blanchard will continue metformin for now and prescription was not written today. Connie Blanchard agreed to follow up with [place]  as directed to monitor her progress.  Diabetes risk counseling Jaleyah was given extended (15 minutes) diabetes prevention counseling today. She is 47 y.o. female and has risk factors for diabetes including obesity and insulin resistance. We discussed intensive lifestyle modifications today with an emphasis on weight loss as well as increasing exercise and decreasing simple carbohydrates in her diet.  Vitamin D Deficiency Connie Blanchard was informed that low vitamin D levels contributes to fatigue and are associated with obesity, breast, and colon cancer. She agrees to continue to take prescription Vit D @50 ,000 IU every week. We will check labs and she will follow up for routine testing of vitamin D, at least 2-3 times per year. She was informed of the risk of over-replacement of vitamin D and agrees to not increase her dose unless she discusses this with [place] first.  Obesity Connie Blanchard is currently in the action stage of change. As such, her goal is to continue with weight loss efforts She has agreed to follow the Category 3 plan Connie Blanchard has been instructed to work up to a goal of 150 minutes of combined cardio and strengthening exercise per week for weight loss and overall health benefits. We discussed the following Behavioral Modification Strategies today: increasing lean protein intake and work on meal planning and easy cooking plans  Katrisha has agreed to follow up with our clinic in 2 weeks. She was informed of the importance of frequent follow up visits to maximize her success with intensive lifestyle modifications for her multiple health conditions.   OBESITY BEHAVIORAL INTERVENTION VISIT  Today's visit was # 7 out of 22.  Starting weight: 233 lbs Starting date: 10/12/17 Today's weight : 219 lbs    Today's date: 01/25/2018 Total lbs lost to date: 14 (Patients must lose 7 lbs in the first 6 months to continue with counseling)   ASK: We discussed the diagnosis of obesity with Connie Blanchard today and Connie Blanchard agreed to give [place] permission to discuss obesity behavioral modification therapy today.  ASSESS: Connie Blanchard has the diagnosis of obesity and her BMI today is 34.29 Connie Blanchard is in the action stage of change   ADVISE: Connie Blanchard was educated on the multiple health risks of obesity as well as the benefit of weight loss to improve her health. She was advised of the need for long term treatment and the importance of lifestyle modifications.  AGREE: Multiple dietary modification options and treatment options were discussed and  Connie Blanchard agreed to the above obesity treatment plan.   Corey Skains, am acting as transcriptionist for [employer], PA-C  I, [facility], have reviewed this note and agree with its content

## 2018-01-26 LAB — COMPREHENSIVE METABOLIC PANEL
A/G RATIO: 1.5 (ref 1.2–2.2)
ALK PHOS: 48 IU/L (ref 39–117)
ALT: 15 IU/L (ref 0–32)
AST: 15 IU/L (ref 0–40)
Albumin: 4.1 g/dL (ref 3.5–5.5)
BILIRUBIN TOTAL: 0.3 mg/dL (ref 0.0–1.2)
BUN/Creatinine Ratio: 16 (ref 9–23)
BUN: 14 mg/dL (ref 6–24)
CO2: 24 mmol/L (ref 20–29)
CREATININE: 0.9 mg/dL (ref 0.57–1.00)
Calcium: 8.8 mg/dL (ref 8.7–10.2)
Chloride: 100 mmol/L (ref 96–106)
GFR calc Af Amer: 87 mL/min/{1.73_m2} (ref >59)
GFR, EST NON AFRICAN AMERICAN: 75 mL/min/{1.73_m2} (ref >59)
GLOBULIN, TOTAL: 2.7 g/dL (ref 1.5–4.5)
Glucose: 95 mg/dL (ref 65–99)
Potassium: 4.3 mmol/L (ref 3.5–5.2)
Sodium: 140 mmol/L (ref 134–144)
Total Protein: 6.8 g/dL (ref 6.0–8.5)

## 2018-01-26 LAB — LIPID PANEL WITH LDL/HDL RATIO
CHOLESTEROL TOTAL: 163 mg/dL (ref 100–199)
HDL: 52 mg/dL (ref >39)
LDL CALC: 92 mg/dL (ref 0–99)
LDl/HDL Ratio: 1.8 ratio (ref 0.0–3.2)
Triglycerides: 97 mg/dL (ref 0–149)
VLDL Cholesterol Cal: 19 mg/dL (ref 5–40)

## 2018-01-26 LAB — VITAMIN D 25 HYDROXY (VIT D DEFICIENCY, FRACTURES): Vit D, 25-Hydroxy: 46.1 ng/mL (ref 30.0–100.0)

## 2018-01-26 LAB — HEMOGLOBIN A1C
ESTIMATED AVERAGE GLUCOSE: 108 mg/dL
Hgb A1c MFr Bld: 5.4 % (ref 4.8–5.6)

## 2018-01-26 LAB — INSULIN, RANDOM: INSULIN: 11.2 u[IU]/mL (ref 2.6–24.9)

## 2018-02-21 ENCOUNTER — Ambulatory Visit (INDEPENDENT_AMBULATORY_CARE_PROVIDER_SITE_OTHER): Payer: 59 | Admitting: Physician Assistant

## 2018-02-21 VITALS — BP 135/84 | HR 67 | Temp 97.9°F | Ht 67.0 in | Wt 220.0 lb

## 2018-02-21 DIAGNOSIS — Z6834 Body mass index (BMI) 34.0-34.9, adult: Secondary | ICD-10-CM

## 2018-02-21 DIAGNOSIS — Z9189 Other specified personal risk factors, not elsewhere classified: Secondary | ICD-10-CM

## 2018-02-21 DIAGNOSIS — E559 Vitamin D deficiency, unspecified: Secondary | ICD-10-CM | POA: Diagnosis not present

## 2018-02-21 DIAGNOSIS — E669 Obesity, unspecified: Secondary | ICD-10-CM | POA: Diagnosis not present

## 2018-02-21 DIAGNOSIS — E8881 Metabolic syndrome: Secondary | ICD-10-CM | POA: Diagnosis not present

## 2018-02-21 MED ORDER — METFORMIN HCL 500 MG PO TABS: 500.0000 mg | ORAL_TABLET | Freq: Every day | ORAL | 0 refills | Status: DC

## 2018-02-21 MED ORDER — VITAMIN D (ERGOCALCIFEROL) 1.25 MG (50000 UNIT) PO CAPS: 50000.0000 [IU] | ORAL_CAPSULE | ORAL | 0 refills | Status: DC

## 2018-02-22 NOTE — Progress Notes (Signed)
Office: [phone]  /  Fax: [phone]   HPI:   Chief Complaint: OBESITY Connie Blanchard is here to discuss her progress with her obesity treatment plan. She is on the Category 3 plan and is following her eating plan approximately 50 % of the time. She states she is walking 30 minutes 5 times per week. Connie Blanchard was on vacation last week and did well with controlling portions and making smarter food choices. She reports getting bored with lunch at times. She also states that she has a hard time getting all the protein in at dinner. Her weight is 220 lb (99.8 kg) today and has had a weight gain of 1 pounds over a period of 4 weeks since her last visit. She has lost 13 lbs since starting treatment with [place].  Vitamin D deficiency Connie Blanchard has a diagnosis of vitamin D deficiency. She is currently taking prescription vit D and her last level was not at goal. She denies nausea, vomiting or muscle weakness.  Insulin Resistance Connie Blanchard has a diagnosis of insulin resistance based on her elevated fasting insulin level >5. Although Connie Blanchard's blood glucose readings are still under good control, insulin resistance puts her at greater risk of metabolic syndrome and diabetes. She is taking metformin currently and continues to work on diet and exercise to decrease risk of diabetes. Connie Blanchard notes some polyphagia in the afternoons and evening, but she is not eating all of her protein at dinner. She denies hypoglycemia  At risk for diabetes Connie Blanchard is at higher than average risk for developing diabetes due to her obesity and insulin resistance. She currently denies polyuria or polydipsia.  ALLERGIES: Allergies  Allergen Reactions  . Penicillins Rash    Has patient had a PCN reaction causing immediate rash, facial/tongue/throat swelling, SOB or lightheadedness with hypotension: Unknown Has patient had a PCN reaction causing severe rash involving mucus membranes or skin necrosis: Unknown Has patient had a PCN  reaction that required hospitalization: No Has patient had a PCN reaction occurring within the last 10 years: No Childhood allergy  If all of the above answers are "NO", then may proceed with Cephalosporin use.   . Sulfa Antibiotics Rash    MEDICATIONS: Current Outpatient Medications on File Prior to Visit  Medication Sig Dispense Refill  . b complex vitamins tablet Take 1 tablet by mouth daily.    . cetirizine (ZYRTEC) 10 MG tablet Take 10 mg by mouth daily as needed for allergies.    Marland Kitchen esomeprazole (NEXIUM) 40 MG capsule TAKE 1 CAPSULE BY MOUTH DAILY BEFORE BREAKFAST. 90 capsule 0  . Multiple Vitamin (MULTIVITAMIN) tablet Take 1 tablet by mouth daily.      . Probiotic Product (RESTORA) CAPS Take 1 capsule by mouth daily.  12  . triamterene-hydrochlorothiazide (MAXZIDE-25) 37.5-25 MG tablet TAKE 1 TABLET BY MOUTH EVERY DAY 90 tablet 0   No current facility-administered medications on file prior to visit.     PAST MEDICAL HISTORY: Past Medical History:  Diagnosis Date  . Acute bronchitis 06/24/2012  . Acute bronchitis with asthma 06/24/2012  . Allergic state 10/15/2013  . Anxiety   . Back pain 10/15/2013  . Chronic sinusitis 02/2012   current runny nose of clear drainage  . Constipation   . Dental crowns present   . Depression   . GERD (gastroesophageal reflux disease)   . H/O gestational diabetes mellitus, not currently pregnant 07/29/2011   h/o   . Headache    history of migraines  . History of blood transfusion   .  Hyperglycemia 05/25/2017  . Hypertension   . Iron deficiency anemia   . Leg edema   . Obese   . PCOS (polycystic ovarian syndrome)   . Right shoulder pain 05/25/2017    PAST SURGICAL HISTORY: Past Surgical History:  Procedure Laterality Date  . COLONOSCOPY    . DILATION AND CURETTAGE OF UTERUS  1987  . NASAL SEPTOPLASTY W/ TURBINOPLASTY  03/23/2012   Procedure: NASAL SEPTOPLASTY WITH TURBINATE REDUCTION;  Surgeon: Jerrell Belfast, MD;  Location: [facility];  Service: ENT;  Laterality: Bilateral;  . ROBOTIC ASSISTED TOTAL HYSTERECTOMY WITH SALPINGECTOMY N/A 03/17/2017   Procedure: ROBOTIC ASSISTED TOTAL HYSTERECTOMY WITH SALPINGECTOMY;  Surgeon: Bobbye Charleston, MD;  Location: [facility];  Service: Gynecology;  Laterality: N/A;  . SINUS ENDO W/FUSION  03/23/2012   Procedure: ENDOSCOPIC SINUS SURGERY WITH FUSION NAVIGATION;  Surgeon: Jerrell Belfast, MD;  Location: [facility];  Service: ENT;  Laterality: Bilateral;  with fusion scan  . UPPER GI ENDOSCOPY    . WISDOM TOOTH EXTRACTION      SOCIAL HISTORY: Social History   Tobacco Use  . Smoking status: Former Smoker    Packs/day: 1.00    Years: 19.00    Pack years: 19.00    Last attempt to quit: 07/28/2003    Years since quitting: 14.5  . Smokeless tobacco: Never Used  Substance Use Topics  . Alcohol use: Yes    Comment: occasionally  . Drug use: No    FAMILY HISTORY: Family History  Problem Relation Age of Onset  . Diabetes Mother        type 2  . Hypertension Mother   . Depression Mother   . Mental illness Mother        bi-polar  . Pulmonary embolism Mother   . Obesity Mother   . Heart disease Maternal Grandfather   . Stroke Maternal Grandfather   . Other Father        drug addiction  . Depression Maternal Aunt 46       suicide attempt with pneumonia    ROS: Review of Systems  Constitutional: Negative for weight loss.  Gastrointestinal: Negative for nausea and vomiting.  Genitourinary: Negative for frequency.  Musculoskeletal:       Negative for muscle weakness  Endo/Heme/Allergies: Negative for polydipsia.       Positive for polyphagia Negative for hypoglycemia    PHYSICAL EXAM: Blood pressure 135/84, pulse 67, temperature 97.9 F (36.6 C), temperature source Oral, height 5\' 7"  (1.702 m), weight 220 lb (99.8 kg), last menstrual period 03/03/2017, SpO2 98 %. Body mass index is 34.46 kg/m. Physical Exam  Constitutional: She is  oriented to person, place, and time. She appears well-developed and well-nourished.  Cardiovascular: Normal rate.  Pulmonary/Chest: Effort normal.  Musculoskeletal: Normal range of motion.  Neurological: She is oriented to person, place, and time.  Skin: Skin is warm and dry.  Psychiatric: She has a normal mood and affect. Her behavior is normal.  Vitals reviewed.   RECENT LABS AND TESTS: BMET    Component Value Date/Time   NA 140 01/25/2018 1417   K 4.3 01/25/2018 1417   CL 100 01/25/2018 1417   CO2 24 01/25/2018 1417   GLUCOSE 95 01/25/2018 1417   GLUCOSE 80 05/25/2017 1442   BUN 14 01/25/2018 1417   CREATININE 0.90 01/25/2018 1417   CREATININE 0.99 03/31/2016 0952   CALCIUM 8.8 01/25/2018 1417   GFRNONAA 75 01/25/2018 1417   GFRNONAA 68 03/31/2016 4259  GFRAA 87 01/25/2018 1417   GFRAA 78 03/31/2016 0952   Lab Results  Component Value Date   HGBA1C 5.4 01/25/2018   HGBA1C 5.6 10/12/2017   HGBA1C 5.6 05/25/2017   Lab Results  Component Value Date   INSULIN 11.2 01/25/2018   INSULIN 17.0 10/12/2017   CBC    Component Value Date/Time   WBC 6.0 10/12/2017 1122   WBC 6.9 05/25/2017 1442   RBC 4.47 10/12/2017 1122   RBC 4.41 05/25/2017 1442   HGB 13.6 10/12/2017 1122   HCT 40.9 10/12/2017 1122   PLT 242.0 05/25/2017 1442   MCV 92 10/12/2017 1122   MCH 30.4 10/12/2017 1122   MCH 29.8 03/05/2017 1100   MCHC 33.3 10/12/2017 1122   MCHC 33.2 05/25/2017 1442   RDW 13.6 10/12/2017 1122   LYMPHSABS 1.9 10/12/2017 1122   EOSABS 0.5 (H) 10/12/2017 1122   BASOSABS 0.0 10/12/2017 1122   Iron/TIBC/Ferritin/ %Sat    Component Value Date/Time   IRON 116 05/25/2017 1442   TIBC 397 10/29/2011 1552   IRONPCTSAT 7 (L) 10/29/2011 1552   Lipid Panel     Component Value Date/Time   CHOL 163 01/25/2018 1417   TRIG 97 01/25/2018 1417   HDL 52 01/25/2018 1417   CHOLHDL 4 05/25/2017 1442   VLDL 17.0 05/25/2017 1442   LDLCALC 92 01/25/2018 1417   Hepatic Function  Panel     Component Value Date/Time   PROT 6.8 01/25/2018 1417   ALBUMIN 4.1 01/25/2018 1417   AST 15 01/25/2018 1417   ALT 15 01/25/2018 1417   ALKPHOS 48 01/25/2018 1417   BILITOT 0.3 01/25/2018 1417   BILIDIR 0.1 10/12/2013 1440   IBILI 0.3 10/12/2013 1440      Component Value Date/Time   TSH 2.600 10/12/2017 1122   TSH 2.32 05/25/2017 1442   TSH 3.24 05/19/2016 1550   Results for RAEANA, BLINN (MRN [id number]) as of 02/22/2018 11:02  Ref. Range 01/25/2018 14:17  Vitamin D, 25-Hydroxy Latest Ref Range: 30.0 - 100.0 ng/mL 46.1   ASSESSMENT AND PLAN: Vitamin D deficiency - Plan: Vitamin D, Ergocalciferol, (DRISDOL) 50000 units CAPS capsule  Insulin resistance - Plan: metFORMIN (GLUCOPHAGE) 500 MG tablet  At risk for diabetes mellitus  Class 1 obesity with serious comorbidity and body mass index (BMI) of 34.0 to 34.9 in adult, unspecified obesity type  PLAN:  Vitamin D Deficiency Connie Blanchard was informed that low vitamin D levels contributes to fatigue and are associated with obesity, breast, and colon cancer. She agrees to continue to take prescription Vit D @50 ,000 IU every week #4 with no refills and will follow up for routine testing of vitamin D, at least 2-3 times per year. She was informed of the risk of over-replacement of vitamin D and agrees to not increase her dose unless she discusses this with [place] first. Connie Blanchard agrees to follow up as directed.  Insulin Resistance Connie Blanchard will continue to work on weight loss, exercise, and decreasing simple carbohydrates in her diet to help decrease the risk of diabetes. We dicussed metformin including benefits and risks. She was informed that eating too many simple carbohydrates or too many calories at one sitting increases the likelihood of GI side effects. Connie Blanchard requested metformin for now and prescription was written today for 1 month refill. Connie Blanchard agreed to follow up with [place] as directed to monitor her progress.  Diabetes  risk counseling Connie Blanchard was given extended (15 minutes) diabetes prevention counseling today. She is 47 y.o. female and  has risk factors for diabetes including obesity and insulin resistance. We discussed intensive lifestyle modifications today with an emphasis on weight loss as well as increasing exercise and decreasing simple carbohydrates in her diet.  Obesity Connie Blanchard is currently in the action stage of change. As such, her goal is to continue with weight loss efforts She has agreed to keep a food journal with 350 to 450 calories and 30 grams of protein at lunch daily and follow the Category 3 plan Connie Blanchard was given recipes and eating out ideas today. Yoshino has been instructed to work up to a goal of 150 minutes of combined cardio and strengthening exercise per week for weight loss and overall health benefits. We discussed the following Behavioral Modification Strategies today: increasing lean protein intake and work on meal planning and easy cooking plans  Connie Blanchard has agreed to follow up with our clinic in 2 weeks. She was informed of the importance of frequent follow up visits to maximize her success with intensive lifestyle modifications for her multiple health conditions.   OBESITY BEHAVIORAL INTERVENTION VISIT  Today's visit was # 8 out of 22.  Starting weight: 233 lbs Starting date: 10/12/17 Today's weight : 220 lbs Today's date: 02/21/2018 Total lbs lost to date: 13    ASK: We discussed the diagnosis of obesity with Connie Blanchard today and Connie Blanchard agreed to give [place] permission to discuss obesity behavioral modification therapy today.  ASSESS: Connie Blanchard has the diagnosis of obesity and her BMI today is 34.45 Connie Blanchard is in the action stage of change   ADVISE: Connie Blanchard was educated on the multiple health risks of obesity as well as the benefit of weight loss to improve her health. She was advised of the need for long term treatment and the importance of lifestyle  modifications.  AGREE: Multiple dietary modification options and treatment options were discussed and  Avyonna agreed to the above obesity treatment plan.  Corey Skains, am acting as transcriptionist for Abby Potash, PA-C I, Abby Potash, PA-C have reviewed above note and agree with its content

## 2018-02-23 LAB — HM MAMMOGRAPHY

## 2018-03-03 ENCOUNTER — Ambulatory Visit (INDEPENDENT_AMBULATORY_CARE_PROVIDER_SITE_OTHER): Payer: 59 | Admitting: Physician Assistant

## 2018-03-03 VITALS — BP 113/72 | HR 71 | Temp 97.8°F | Ht 67.0 in | Wt 218.0 lb

## 2018-03-03 DIAGNOSIS — E669 Obesity, unspecified: Secondary | ICD-10-CM | POA: Diagnosis not present

## 2018-03-03 DIAGNOSIS — Z6834 Body mass index (BMI) 34.0-34.9, adult: Secondary | ICD-10-CM

## 2018-03-03 DIAGNOSIS — E559 Vitamin D deficiency, unspecified: Secondary | ICD-10-CM

## 2018-03-07 NOTE — Progress Notes (Signed)
Office: [phone]  /  Fax: [phone]   HPI:   Chief Complaint: OBESITY Connie Blanchard is here to discuss her progress with her obesity treatment plan. She is on the  keep a food journal with 350 to 450 calories and 30 grams of protein at lunch daily and the Category 3 plan and is following her eating plan approximately 100 % of the time. She states she is walking 30 minutes 2 times per week. Connie Blanchard did very well with weight loss. She states that she loves the freedom of journaling and would like to journal all day. She states that she continues to be hungry in the evening after dinner.Marland Kitchen  Her weight is 218 lb (98.9 kg) today and has had a weight loss of 2 pounds over a period of 2 weeks since her last visit. She has lost 15 lbs since starting treatment with [place].  Vitamin D deficiency Connie Blanchard has a diagnosis of vitamin D deficiency. She is on prescription vit D and denies nausea, vomiting or muscle weakness.  ALLERGIES: Allergies  Allergen Reactions  . Penicillins Rash    Has patient had a PCN reaction causing immediate rash, facial/tongue/throat swelling, SOB or lightheadedness with hypotension: Unknown Has patient had a PCN reaction causing severe rash involving mucus membranes or skin necrosis: Unknown Has patient had a PCN reaction that required hospitalization: No Has patient had a PCN reaction occurring within the last 10 years: No Childhood allergy  If all of the above answers are "NO", then may proceed with Cephalosporin use.   . Sulfa Antibiotics Rash    MEDICATIONS: Current Outpatient Medications on File Prior to Visit  Medication Sig Dispense Refill  . b complex vitamins tablet Take 1 tablet by mouth daily.    . cetirizine (ZYRTEC) 10 MG tablet Take 10 mg by mouth daily as needed for allergies.    Marland Kitchen esomeprazole (NEXIUM) 40 MG capsule TAKE 1 CAPSULE BY MOUTH DAILY BEFORE BREAKFAST. 90 capsule 0  . metFORMIN (GLUCOPHAGE) 500 MG tablet Take 1 tablet (500 mg total) by  mouth daily with breakfast. 30 tablet 0  . Multiple Vitamin (MULTIVITAMIN) tablet Take 1 tablet by mouth daily.      . Probiotic Product (RESTORA) CAPS Take 1 capsule by mouth daily.  12  . triamterene-hydrochlorothiazide (MAXZIDE-25) 37.5-25 MG tablet TAKE 1 TABLET BY MOUTH EVERY DAY 90 tablet 0  . Vitamin D, Ergocalciferol, (DRISDOL) 50000 units CAPS capsule Take 1 capsule (50,000 Units total) by mouth every 7 (seven) days. 4 capsule 0   No current facility-administered medications on file prior to visit.     PAST MEDICAL HISTORY: Past Medical History:  Diagnosis Date  . Acute bronchitis 06/24/2012  . Acute bronchitis with asthma 06/24/2012  . Allergic state 10/15/2013  . Anxiety   . Back pain 10/15/2013  . Chronic sinusitis 02/2012   current runny nose of clear drainage  . Constipation   . Dental crowns present   . Depression   . GERD (gastroesophageal reflux disease)   . H/O gestational diabetes mellitus, not currently pregnant 07/29/2011   h/o   . Headache    history of migraines  . History of blood transfusion   . Hyperglycemia 05/25/2017  . Hypertension   . Iron deficiency anemia   . Leg edema   . Obese   . PCOS (polycystic ovarian syndrome)   . Right shoulder pain 05/25/2017    PAST SURGICAL HISTORY: Past Surgical History:  Procedure Laterality Date  . COLONOSCOPY    .  DILATION AND CURETTAGE OF UTERUS  1987  . NASAL SEPTOPLASTY W/ TURBINOPLASTY  03/23/2012   Procedure: NASAL SEPTOPLASTY WITH TURBINATE REDUCTION;  Surgeon: Jerrell Belfast, MD;  Location: [facility];  Service: ENT;  Laterality: Bilateral;  . ROBOTIC ASSISTED TOTAL HYSTERECTOMY WITH SALPINGECTOMY N/A 03/17/2017   Procedure: ROBOTIC ASSISTED TOTAL HYSTERECTOMY WITH SALPINGECTOMY;  Surgeon: Bobbye Charleston, MD;  Location: [facility];  Service: Gynecology;  Laterality: N/A;  . SINUS ENDO W/FUSION  03/23/2012   Procedure: ENDOSCOPIC SINUS SURGERY WITH FUSION NAVIGATION;  Surgeon: Jerrell Belfast,  MD;  Location: [facility];  Service: ENT;  Laterality: Bilateral;  with fusion scan  . UPPER GI ENDOSCOPY    . WISDOM TOOTH EXTRACTION      SOCIAL HISTORY: Social History   Tobacco Use  . Smoking status: Former Smoker    Packs/day: 1.00    Years: 19.00    Pack years: 19.00    Last attempt to quit: 07/28/2003    Years since quitting: 14.6  . Smokeless tobacco: Never Used  Substance Use Topics  . Alcohol use: Yes    Comment: occasionally  . Drug use: No    FAMILY HISTORY: Family History  Problem Relation Age of Onset  . Diabetes Mother        type 2  . Hypertension Mother   . Depression Mother   . Mental illness Mother        bi-polar  . Pulmonary embolism Mother   . Obesity Mother   . Heart disease Maternal Grandfather   . Stroke Maternal Grandfather   . Other Father        drug addiction  . Depression Maternal Aunt 46       suicide attempt with pneumonia    ROS: Review of Systems  Constitutional: Positive for weight loss.  Gastrointestinal: Negative for nausea and vomiting.  Musculoskeletal:       Negative for muscle weakness  Endo/Heme/Allergies:       Positive for polyphagia    PHYSICAL EXAM: Blood pressure 113/72, pulse 71, temperature 97.8 F (36.6 C), temperature source Oral, height 5\' 7"  (1.702 m), weight 218 lb (98.9 kg), last menstrual period 03/03/2017, SpO2 97 %. Body mass index is 34.14 kg/m. Physical Exam  Constitutional: She is oriented to person, place, and time. She appears well-developed and well-nourished.  Cardiovascular: Normal rate.  Pulmonary/Chest: Effort normal.  Musculoskeletal: Normal range of motion.  Neurological: She is oriented to person, place, and time.  Skin: Skin is warm and dry.  Psychiatric: She has a normal mood and affect. Her behavior is normal.  Vitals reviewed.   RECENT LABS AND TESTS: BMET    Component Value Date/Time   NA 140 01/25/2018 1417   K 4.3 01/25/2018 1417   CL 100 01/25/2018 1417     CO2 24 01/25/2018 1417   GLUCOSE 95 01/25/2018 1417   GLUCOSE 80 05/25/2017 1442   BUN 14 01/25/2018 1417   CREATININE 0.90 01/25/2018 1417   CREATININE 0.99 03/31/2016 0952   CALCIUM 8.8 01/25/2018 1417   GFRNONAA 75 01/25/2018 1417   GFRNONAA 68 03/31/2016 0952   GFRAA 87 01/25/2018 1417   GFRAA 78 03/31/2016 0952   Lab Results  Component Value Date   HGBA1C 5.4 01/25/2018   HGBA1C 5.6 10/12/2017   HGBA1C 5.6 05/25/2017   Lab Results  Component Value Date   INSULIN 11.2 01/25/2018   INSULIN 17.0 10/12/2017   CBC    Component Value Date/Time  WBC 6.0 10/12/2017 1122   WBC 6.9 05/25/2017 1442   RBC 4.47 10/12/2017 1122   RBC 4.41 05/25/2017 1442   HGB 13.6 10/12/2017 1122   HCT 40.9 10/12/2017 1122   PLT 242.0 05/25/2017 1442   MCV 92 10/12/2017 1122   MCH 30.4 10/12/2017 1122   MCH 29.8 03/05/2017 1100   MCHC 33.3 10/12/2017 1122   MCHC 33.2 05/25/2017 1442   RDW 13.6 10/12/2017 1122   LYMPHSABS 1.9 10/12/2017 1122   EOSABS 0.5 (H) 10/12/2017 1122   BASOSABS 0.0 10/12/2017 1122   Iron/TIBC/Ferritin/ %Sat    Component Value Date/Time   IRON 116 05/25/2017 1442   TIBC 397 10/29/2011 1552   IRONPCTSAT 7 (L) 10/29/2011 1552   Lipid Panel     Component Value Date/Time   CHOL 163 01/25/2018 1417   TRIG 97 01/25/2018 1417   HDL 52 01/25/2018 1417   CHOLHDL 4 05/25/2017 1442   VLDL 17.0 05/25/2017 1442   LDLCALC 92 01/25/2018 1417   Hepatic Function Panel     Component Value Date/Time   PROT 6.8 01/25/2018 1417   ALBUMIN 4.1 01/25/2018 1417   AST 15 01/25/2018 1417   ALT 15 01/25/2018 1417   ALKPHOS 48 01/25/2018 1417   BILITOT 0.3 01/25/2018 1417   BILIDIR 0.1 10/12/2013 1440   IBILI 0.3 10/12/2013 1440      Component Value Date/Time   TSH 2.600 10/12/2017 1122   TSH 2.32 05/25/2017 1442   TSH 3.24 05/19/2016 1550   Results for SARABETH, BENTON (MRN [id number]) as of 03/07/2018 08:34  Ref. Range 01/25/2018 14:17  Vitamin D, 25-Hydroxy Latest  Ref Range: 30.0 - 100.0 ng/mL 46.1   ASSESSMENT AND PLAN: Vitamin D deficiency  Class 1 obesity with serious comorbidity and body mass index (BMI) of 34.0 to 34.9 in adult, unspecified obesity type  PLAN:  Vitamin D Deficiency Connie Blanchard was informed that low vitamin D levels contributes to fatigue and are associated with obesity, breast, and colon cancer. She agrees to continue to take prescription Vit D @50 ,000 IU every week and will follow up for routine testing of vitamin D, at least 2-3 times per year. She was informed of the risk of over-replacement of vitamin D and agrees to not increase her dose unless she discusses this with [place] first.  We spent > than 50% of the 15 minute visit on the counseling as documented in the note.  Obesity Connie Blanchard is currently in the action stage of change. As such, her goal is to continue with weight loss efforts She has agreed to keep a food journal with 1500 to 1600 calories and 85+ grams of protein daily Connie Blanchard has been instructed to work up to a goal of 150 minutes of combined cardio and strengthening exercise per week for weight loss and overall health benefits. We discussed the following Behavioral Modification Strategies today: work on meal planning and easy cooking plans and ways to avoid night time snacking  Connie Blanchard has agreed to follow up with our clinic in 2 weeks. She was informed of the importance of frequent follow up visits to maximize her success with intensive lifestyle modifications for her multiple health conditions.   OBESITY BEHAVIORAL INTERVENTION VISIT  Today's visit was # 9 out of 22.  Starting weight: 233 lbs Starting date: 10/12/17 Today's weight : 218 lbs Today's date: 03/03/2018 Total lbs lost to date: 15    ASK: We discussed the diagnosis of obesity with Connie Blanchard today and Connie Blanchard agreed  to give [place] permission to discuss obesity behavioral modification therapy today.  ASSESS: Connie Blanchard has the diagnosis of  obesity and her BMI today is 34.14 Connie Blanchard is in the action stage of change   ADVISE: Connie Blanchard was educated on the multiple health risks of obesity as well as the benefit of weight loss to improve her health. She was advised of the need for long term treatment and the importance of lifestyle modifications.  AGREE: Multiple dietary modification options and treatment options were discussed and  Connie Blanchard agreed to the above obesity treatment plan.  IDoreene Nest, am acting as transcriptionist for [employer], PA-C

## 2018-03-21 ENCOUNTER — Ambulatory Visit (INDEPENDENT_AMBULATORY_CARE_PROVIDER_SITE_OTHER): Payer: 59 | Admitting: Physician Assistant

## 2018-03-21 VITALS — BP 120/79 | HR 63 | Temp 97.7°F | Ht 67.0 in | Wt 220.0 lb

## 2018-03-21 DIAGNOSIS — E8881 Metabolic syndrome: Secondary | ICD-10-CM

## 2018-03-21 DIAGNOSIS — Z9189 Other specified personal risk factors, not elsewhere classified: Secondary | ICD-10-CM | POA: Diagnosis not present

## 2018-03-21 DIAGNOSIS — E559 Vitamin D deficiency, unspecified: Secondary | ICD-10-CM | POA: Diagnosis not present

## 2018-03-21 DIAGNOSIS — Z6841 Body Mass Index (BMI) 40.0 and over, adult: Secondary | ICD-10-CM

## 2018-03-21 MED ORDER — METFORMIN HCL 500 MG PO TABS: 500.0000 mg | ORAL_TABLET | Freq: Every day | ORAL | 0 refills | Status: DC

## 2018-03-21 MED ORDER — VITAMIN D (ERGOCALCIFEROL) 1.25 MG (50000 UNIT) PO CAPS: 50000.0000 [IU] | ORAL_CAPSULE | ORAL | 0 refills | Status: DC

## 2018-03-21 NOTE — Progress Notes (Signed)
Office: [phone]  /  Fax: [phone]   HPI:   Chief Complaint: OBESITY Connie Blanchard is here to discuss her progress with her obesity treatment plan. She is on the keep a food journal with 1500-1600 calories and 85+ grams of protein daily and is following her eating plan approximately 20 % of the time. She states she is exercising 0 minutes 0 times per week. Heatherly reports getting off track on vacation. She states she has not been journaling. She is ready to get back on track.  Her weight is 220 lb (99.8 kg) today and has gained 2 pounds since her last visit. She has lost 13 lbs since starting treatment with [place].  Vitamin D Deficiency Connie Blanchard has a diagnosis of vitamin D deficiency. She is on prescription Vit D and denies nausea, vomiting or muscle weakness.  At risk for osteopenia and osteoporosis Connie Blanchard is at higher risk of osteopenia and osteoporosis due to vitamin D deficiency.   Insulin Resistance Connie Blanchard has a diagnosis of insulin resistance based on her elevated fasting insulin level >5. Although Connie Blanchard's blood glucose readings are still under good control, insulin resistance puts her at greater risk of metabolic syndrome and diabetes. She denies polyphagia and she denies nausea, vomiting, or diarrhea on metformin. She continues to work on diet and exercise to decrease risk of diabetes.  ALLERGIES: Allergies  Allergen Reactions  . Penicillins Rash    Has patient had a PCN reaction causing immediate rash, facial/tongue/throat swelling, SOB or lightheadedness with hypotension: Unknown Has patient had a PCN reaction causing severe rash involving mucus membranes or skin necrosis: Unknown Has patient had a PCN reaction that required hospitalization: No Has patient had a PCN reaction occurring within the last 10 years: No Childhood allergy  If all of the above answers are "NO", then may proceed with Cephalosporin use.   . Sulfa Antibiotics Rash    MEDICATIONS: Current  Outpatient Medications on File Prior to Visit  Medication Sig Dispense Refill  . b complex vitamins tablet Take 1 tablet by mouth daily.    . cetirizine (ZYRTEC) 10 MG tablet Take 10 mg by mouth daily as needed for allergies.    Marland Kitchen esomeprazole (NEXIUM) 40 MG capsule TAKE 1 CAPSULE BY MOUTH DAILY BEFORE BREAKFAST. 90 capsule 0  . metFORMIN (GLUCOPHAGE) 500 MG tablet Take 1 tablet (500 mg total) by mouth daily with breakfast. 30 tablet 0  . Multiple Vitamin (MULTIVITAMIN) tablet Take 1 tablet by mouth daily.      . Probiotic Product (RESTORA) CAPS Take 1 capsule by mouth daily.  12  . triamterene-hydrochlorothiazide (MAXZIDE-25) 37.5-25 MG tablet TAKE 1 TABLET BY MOUTH EVERY DAY 90 tablet 0  . Vitamin D, Ergocalciferol, (DRISDOL) 50000 units CAPS capsule Take 1 capsule (50,000 Units total) by mouth every 7 (seven) days. 4 capsule 0   No current facility-administered medications on file prior to visit.     PAST MEDICAL HISTORY: Past Medical History:  Diagnosis Date  . Acute bronchitis 06/24/2012  . Acute bronchitis with asthma 06/24/2012  . Allergic state 10/15/2013  . Anxiety   . Back pain 10/15/2013  . Chronic sinusitis 02/2012   current runny nose of clear drainage  . Constipation   . Dental crowns present   . Depression   . GERD (gastroesophageal reflux disease)   . H/O gestational diabetes mellitus, not currently pregnant 07/29/2011   h/o   . Headache    history of migraines  . History of blood transfusion   .  Hyperglycemia 05/25/2017  . Hypertension   . Iron deficiency anemia   . Leg edema   . Obese   . PCOS (polycystic ovarian syndrome)   . Right shoulder pain 05/25/2017    PAST SURGICAL HISTORY: Past Surgical History:  Procedure Laterality Date  . COLONOSCOPY    . DILATION AND CURETTAGE OF UTERUS  1987  . NASAL SEPTOPLASTY W/ TURBINOPLASTY  03/23/2012   Procedure: NASAL SEPTOPLASTY WITH TURBINATE REDUCTION;  Surgeon: Jerrell Belfast, MD;  Location: [place] [facility];  Service: ENT;  Laterality: Bilateral;  . ROBOTIC ASSISTED TOTAL HYSTERECTOMY WITH SALPINGECTOMY N/A 03/17/2017   Procedure: ROBOTIC ASSISTED TOTAL HYSTERECTOMY WITH SALPINGECTOMY;  Surgeon: Bobbye Charleston, MD;  Location: [facility];  Service: Gynecology;  Laterality: N/A;  . SINUS ENDO W/FUSION  03/23/2012   Procedure: ENDOSCOPIC SINUS SURGERY WITH FUSION NAVIGATION;  Surgeon: Jerrell Belfast, MD;  Location: [place];  Service: ENT;  Laterality: Bilateral;  with fusion scan  . UPPER GI ENDOSCOPY    . WISDOM TOOTH EXTRACTION      SOCIAL HISTORY: Social History   Tobacco Use  . Smoking status: Former Smoker    Packs/day: 1.00    Years: 19.00    Pack years: 19.00    Last attempt to quit: 07/28/2003    Years since quitting: 14.6  . Smokeless tobacco: Never Used  Substance Use Topics  . Alcohol use: Yes    Comment: occasionally  . Drug use: No    FAMILY HISTORY: Family History  Problem Relation Age of Onset  . Diabetes Mother        type 2  . Hypertension Mother   . Depression Mother   . Mental illness Mother        bi-polar  . Pulmonary embolism Mother   . Obesity Mother   . Heart disease Maternal Grandfather   . Stroke Maternal Grandfather   . Other Father        drug addiction  . Depression Maternal Aunt 46       suicide attempt with pneumonia    ROS: Review of Systems  Constitutional: Negative for weight loss.  Gastrointestinal: Negative for diarrhea, nausea and vomiting.  Musculoskeletal:       Negative muscle weakness  Endo/Heme/Allergies:       Negative polyphagia    PHYSICAL EXAM: Blood pressure 120/79, pulse 63, temperature 97.7 F (36.5 C), temperature source Oral, height 5\' 7"  (1.702 m), weight 220 lb (99.8 kg), last menstrual period 03/03/2017, SpO2 97 %. Body mass index is 34.46 kg/m. Physical Exam  Constitutional: She is oriented to person, place, and time. She appears well-developed and well-nourished.  Cardiovascular:  Normal rate.  Pulmonary/Chest: Effort normal.  Musculoskeletal: Normal range of motion.  Neurological: She is oriented to person, place, and time.  Skin: Skin is warm and dry.  Psychiatric: She has a normal mood and affect. Her behavior is normal.  Vitals reviewed.   RECENT LABS AND TESTS: BMET    Component Value Date/Time   NA 140 01/25/2018 1417   K 4.3 01/25/2018 1417   CL 100 01/25/2018 1417   CO2 24 01/25/2018 1417   GLUCOSE 95 01/25/2018 1417   GLUCOSE 80 05/25/2017 1442   BUN 14 01/25/2018 1417   CREATININE 0.90 01/25/2018 1417   CREATININE 0.99 03/31/2016 0952   CALCIUM 8.8 01/25/2018 1417   GFRNONAA 75 01/25/2018 1417   GFRNONAA 68 03/31/2016 0952   GFRAA 87 01/25/2018 1417   GFRAA 78 03/31/2016 1761  Lab Results  Component Value Date   HGBA1C 5.4 01/25/2018   HGBA1C 5.6 10/12/2017   HGBA1C 5.6 05/25/2017   Lab Results  Component Value Date   INSULIN 11.2 01/25/2018   INSULIN 17.0 10/12/2017   CBC    Component Value Date/Time   WBC 6.0 10/12/2017 1122   WBC 6.9 05/25/2017 1442   RBC 4.47 10/12/2017 1122   RBC 4.41 05/25/2017 1442   HGB 13.6 10/12/2017 1122   HCT 40.9 10/12/2017 1122   PLT 242.0 05/25/2017 1442   MCV 92 10/12/2017 1122   MCH 30.4 10/12/2017 1122   MCH 29.8 03/05/2017 1100   MCHC 33.3 10/12/2017 1122   MCHC 33.2 05/25/2017 1442   RDW 13.6 10/12/2017 1122   LYMPHSABS 1.9 10/12/2017 1122   EOSABS 0.5 (H) 10/12/2017 1122   BASOSABS 0.0 10/12/2017 1122   Iron/TIBC/Ferritin/ %Sat    Component Value Date/Time   IRON 116 05/25/2017 1442   TIBC 397 10/29/2011 1552   IRONPCTSAT 7 (L) 10/29/2011 1552   Lipid Panel     Component Value Date/Time   CHOL 163 01/25/2018 1417   TRIG 97 01/25/2018 1417   HDL 52 01/25/2018 1417   CHOLHDL 4 05/25/2017 1442   VLDL 17.0 05/25/2017 1442   LDLCALC 92 01/25/2018 1417   Hepatic Function Panel     Component Value Date/Time   PROT 6.8 01/25/2018 1417   ALBUMIN 4.1 01/25/2018 1417   AST 15  01/25/2018 1417   ALT 15 01/25/2018 1417   ALKPHOS 48 01/25/2018 1417   BILITOT 0.3 01/25/2018 1417   BILIDIR 0.1 10/12/2013 1440   IBILI 0.3 10/12/2013 1440      Component Value Date/Time   TSH 2.600 10/12/2017 1122   TSH 2.32 05/25/2017 1442   TSH 3.24 05/19/2016 1550  Results for MARCIANNE, OZBUN (MRN [id number]) as of 03/21/2018 12:34  Ref. Range 01/25/2018 14:17  Vitamin D, 25-Hydroxy Latest Ref Range: 30.0 - 100.0 ng/mL 46.1    ASSESSMENT AND PLAN: Vitamin D deficiency - Plan: Vitamin D, Ergocalciferol, (DRISDOL) 50000 units CAPS capsule  Insulin resistance - Plan: metFORMIN (GLUCOPHAGE) 500 MG tablet  At risk for osteoporosis  Class 3 severe obesity with serious comorbidity and body mass index (BMI) of 40.0 to 44.9 in adult, unspecified obesity type ([place])  PLAN:  Vitamin D Deficiency Connie Blanchard was informed that low vitamin D levels contributes to fatigue and are associated with obesity, breast, and colon cancer. Connie Blanchard agrees to continue taking prescription Vit D @50 ,000 IU every week #4 and we will refill for 1 month. She will follow up for routine testing of vitamin D, at least 2-3 times per year. She was informed of the risk of over-replacement of vitamin D and agrees to not increase her dose unless she discusses this with [place] first. We will check labs in 2 months. Connie Blanchard agrees to follow up with our clinic in 2 to 3 weeks.  At risk for osteopenia and osteoporosis Connie Blanchard is at risk for osteopenia and osteoporsis due to her vitamin D deficiency. She was encouraged to take her vitamin D and follow her higher calcium diet and increase strengthening exercise to help strengthen her bones and decrease her risk of osteopenia and osteoporosis.  Insulin Resistance Connie Blanchard will continue to work on weight loss, exercise, and decreasing simple carbohydrates in her diet to help decrease the risk of diabetes. We dicussed metformin including benefits and risks. She was informed that  eating too many simple carbohydrates or too many calories  at one sitting increases the likelihood of GI side effects. Connie Blanchard agrees to continue taking metformin 500 mg q AM #30 and we will refill for 1 month. Connie Blanchard agrees to follow up with our clinic in 2 to 3 weeks as directed to monitor her progress.  Obesity Connie Blanchard is currently in the action stage of change. As such, her goal is to continue with weight loss efforts She has agreed to keep a food journal with 1500-1600 calories and 85+ grams of protein daily Connie Blanchard has been instructed to work up to a goal of 150 minutes of combined cardio and strengthening exercise per week for weight loss and overall health benefits. We discussed the following Behavioral Modification Strategies today: work on meal planning and easy cooking plans and planning for success   Connie Blanchard has agreed to follow up with our clinic in 2 to 3 weeks. She was informed of the importance of frequent follow up visits to maximize her success with intensive lifestyle modifications for her multiple health conditions.   OBESITY BEHAVIORAL INTERVENTION VISIT  Today's visit was # 10.  Starting weight: 233 lbs Starting date: 10/12/17 Today's weight : 220 lbs Today's date: 03/21/2018 Total lbs lost to date: 13    ASK: We discussed the diagnosis of obesity with Connie Blanchard today and Connie Blanchard agreed to give [place] permission to discuss obesity behavioral modification therapy today.  ASSESS: Connie Blanchard has the diagnosis of obesity and her BMI today is 34.45 Connie Blanchard is in the action stage of change   ADVISE: Connie Blanchard was educated on the multiple health risks of obesity as well as the benefit of weight loss to improve her health. She was advised of the need for long term treatment and the importance of lifestyle modifications.  AGREE: Multiple dietary modification options and treatment options were discussed and  Connie Blanchard agreed to the above obesity treatment  plan.  Wilhemena Durie, am acting as transcriptionist for Abby Potash, PA-C I, Abby Potash, PA-C have reviewed above note and agree with its content

## 2018-04-13 ENCOUNTER — Ambulatory Visit (INDEPENDENT_AMBULATORY_CARE_PROVIDER_SITE_OTHER): Payer: 59 | Admitting: Physician Assistant

## 2018-04-13 VITALS — BP 115/72 | HR 68 | Temp 98.0°F | Ht 67.0 in | Wt 219.0 lb

## 2018-04-13 DIAGNOSIS — E559 Vitamin D deficiency, unspecified: Secondary | ICD-10-CM | POA: Diagnosis not present

## 2018-04-13 DIAGNOSIS — Z9189 Other specified personal risk factors, not elsewhere classified: Secondary | ICD-10-CM | POA: Diagnosis not present

## 2018-04-13 DIAGNOSIS — E8881 Metabolic syndrome: Secondary | ICD-10-CM | POA: Diagnosis not present

## 2018-04-13 DIAGNOSIS — Z6834 Body mass index (BMI) 34.0-34.9, adult: Secondary | ICD-10-CM

## 2018-04-13 DIAGNOSIS — E669 Obesity, unspecified: Secondary | ICD-10-CM | POA: Diagnosis not present

## 2018-04-13 MED ORDER — VITAMIN D (ERGOCALCIFEROL) 1.25 MG (50000 UNIT) PO CAPS: 50000.0000 [IU] | ORAL_CAPSULE | ORAL | 0 refills | Status: DC

## 2018-04-13 MED ORDER — METFORMIN HCL 500 MG PO TABS: 500.0000 mg | ORAL_TABLET | Freq: Two times a day (BID) | ORAL | 0 refills | Status: DC

## 2018-04-14 NOTE — Progress Notes (Signed)
Office: [phone]  /  Fax: [phone]   HPI:   Chief Complaint: OBESITY Connie Blanchard is here to discuss her progress with her obesity treatment plan. She is on the keep a food journal with 1500-1600 calories and 85+ grams of protein daily and is following her eating plan approximately 70-80 % of the time. She states she is walking for 30 minutes 2 times per week. Connie Blanchard did well with weight loss. She reports doing a good job with meal planning. She reports excessive hunger with dinner, although she is eating all the food on the plan.  Her weight is 219 lb (99.3 kg) today and has had a weight loss of 1 pound over a period of 3 weeks since her last visit. She has lost 14 lbs since starting treatment with [place].  Insulin Resistance Connie Blanchard has a diagnosis of insulin resistance based on her elevated fasting insulin level >5. Although Connie Blanchard's blood glucose readings are still under good control, insulin resistance puts her at greater risk of metabolic syndrome and diabetes. She is on metformin and she reports polyphagia with dinner. She continues to work on diet and exercise to decrease risk of diabetes.  At risk for diabetes Connie Blanchard is at higher than average risk for developing diabetes due to her obesity and insulin resistance. She currently denies polyuria or polydipsia.  Vitamin D Deficiency Connie Blanchard has a diagnosis of vitamin D deficiency. She is on prescription Vit D and denies nausea, vomiting or muscle weakness.  ALLERGIES: Allergies  Allergen Reactions  . Penicillins Rash    Has patient had a PCN reaction causing immediate rash, facial/tongue/throat swelling, SOB or lightheadedness with hypotension: Unknown Has patient had a PCN reaction causing severe rash involving mucus membranes or skin necrosis: Unknown Has patient had a PCN reaction that required hospitalization: No Has patient had a PCN reaction occurring within the last 10 years: No Childhood allergy  If all of the  above answers are "NO", then may proceed with Cephalosporin use.   . Sulfa Antibiotics Rash    MEDICATIONS: Current Outpatient Medications on File Prior to Visit  Medication Sig Dispense Refill  . b complex vitamins tablet Take 1 tablet by mouth daily.    . cetirizine (ZYRTEC) 10 MG tablet Take 10 mg by mouth daily as needed for allergies.    Marland Kitchen esomeprazole (NEXIUM) 40 MG capsule TAKE 1 CAPSULE BY MOUTH DAILY BEFORE BREAKFAST. 90 capsule 0  . Multiple Vitamin (MULTIVITAMIN) tablet Take 1 tablet by mouth daily.      . Probiotic Product (RESTORA) CAPS Take 1 capsule by mouth daily.  12  . triamterene-hydrochlorothiazide (MAXZIDE-25) 37.5-25 MG tablet TAKE 1 TABLET BY MOUTH EVERY DAY 90 tablet 0   No current facility-administered medications on file prior to visit.     PAST MEDICAL HISTORY: Past Medical History:  Diagnosis Date  . Acute bronchitis 06/24/2012  . Acute bronchitis with asthma 06/24/2012  . Allergic state 10/15/2013  . Anxiety   . Back pain 10/15/2013  . Chronic sinusitis 02/2012   current runny nose of clear drainage  . Constipation   . Dental crowns present   . Depression   . GERD (gastroesophageal reflux disease)   . H/O gestational diabetes mellitus, not currently pregnant 07/29/2011   h/o   . Headache    history of migraines  . History of blood transfusion   . Hyperglycemia 05/25/2017  . Hypertension   . Iron deficiency anemia   . Leg edema   . Obese   .  PCOS (polycystic ovarian syndrome)   . Right shoulder pain 05/25/2017    PAST SURGICAL HISTORY: Past Surgical History:  Procedure Laterality Date  . COLONOSCOPY    . DILATION AND CURETTAGE OF UTERUS  1987  . NASAL SEPTOPLASTY W/ TURBINOPLASTY  03/23/2012   Procedure: NASAL SEPTOPLASTY WITH TURBINATE REDUCTION;  Surgeon: Jerrell Belfast, MD;  Location: [place];  Service: ENT;  Laterality: Bilateral;  . ROBOTIC ASSISTED TOTAL HYSTERECTOMY WITH SALPINGECTOMY N/A 03/17/2017   Procedure:  ROBOTIC ASSISTED TOTAL HYSTERECTOMY WITH SALPINGECTOMY;  Surgeon: Bobbye Charleston, MD;  Location: [facility];  Service: Gynecology;  Laterality: N/A;  . SINUS ENDO W/FUSION  03/23/2012   Procedure: ENDOSCOPIC SINUS SURGERY WITH FUSION NAVIGATION;  Surgeon: Jerrell Belfast, MD;  Location: [place];  Service: ENT;  Laterality: Bilateral;  with fusion scan  . UPPER GI ENDOSCOPY    . WISDOM TOOTH EXTRACTION      SOCIAL HISTORY: Social History   Tobacco Use  . Smoking status: Former Smoker    Packs/day: 1.00    Years: 19.00    Pack years: 19.00    Last attempt to quit: 07/28/2003    Years since quitting: 14.7  . Smokeless tobacco: Never Used  Substance Use Topics  . Alcohol use: Yes    Comment: occasionally  . Drug use: No    FAMILY HISTORY: Family History  Problem Relation Age of Onset  . Diabetes Mother        type 2  . Hypertension Mother   . Depression Mother   . Mental illness Mother        bi-polar  . Pulmonary embolism Mother   . Obesity Mother   . Heart disease Maternal Grandfather   . Stroke Maternal Grandfather   . Other Father        drug addiction  . Depression Maternal Aunt 46       suicide attempt with pneumonia    ROS: Review of Systems  Constitutional: Positive for weight loss.  Gastrointestinal: Negative for nausea and vomiting.  Genitourinary: Negative for frequency.  Musculoskeletal:       Negative muscle weakness  Endo/Heme/Allergies: Negative for polydipsia.       Positive polyphagia    PHYSICAL EXAM: Blood pressure 115/72, pulse 68, temperature 98 F (36.7 C), temperature source Oral, height 5\' 7"  (1.702 m), weight 219 lb (99.3 kg), last menstrual period 03/03/2017, SpO2 99 %. Body mass index is 34.3 kg/m. Physical Exam  Constitutional: She is oriented to person, place, and time. She appears well-developed and well-nourished.  Cardiovascular: Normal rate.  Pulmonary/Chest: Effort normal.  Musculoskeletal: Normal range of  motion.  Neurological: She is oriented to person, place, and time.  Skin: Skin is warm and dry.  Psychiatric: She has a normal mood and affect. Her behavior is normal.  Vitals reviewed.   RECENT LABS AND TESTS: BMET    Component Value Date/Time   NA 140 01/25/2018 1417   K 4.3 01/25/2018 1417   CL 100 01/25/2018 1417   CO2 24 01/25/2018 1417   GLUCOSE 95 01/25/2018 1417   GLUCOSE 80 05/25/2017 1442   BUN 14 01/25/2018 1417   CREATININE 0.90 01/25/2018 1417   CREATININE 0.99 03/31/2016 0952   CALCIUM 8.8 01/25/2018 1417   GFRNONAA 75 01/25/2018 1417   GFRNONAA 68 03/31/2016 0952   GFRAA 87 01/25/2018 1417   GFRAA 78 03/31/2016 0952   Lab Results  Component Value Date   HGBA1C 5.4 01/25/2018   HGBA1C  5.6 10/12/2017   HGBA1C 5.6 05/25/2017   Lab Results  Component Value Date   INSULIN 11.2 01/25/2018   INSULIN 17.0 10/12/2017   CBC    Component Value Date/Time   WBC 6.0 10/12/2017 1122   WBC 6.9 05/25/2017 1442   RBC 4.47 10/12/2017 1122   RBC 4.41 05/25/2017 1442   HGB 13.6 10/12/2017 1122   HCT 40.9 10/12/2017 1122   PLT 242.0 05/25/2017 1442   MCV 92 10/12/2017 1122   MCH 30.4 10/12/2017 1122   MCH 29.8 03/05/2017 1100   MCHC 33.3 10/12/2017 1122   MCHC 33.2 05/25/2017 1442   RDW 13.6 10/12/2017 1122   LYMPHSABS 1.9 10/12/2017 1122   EOSABS 0.5 (H) 10/12/2017 1122   BASOSABS 0.0 10/12/2017 1122   Iron/TIBC/Ferritin/ %Sat    Component Value Date/Time   IRON 116 05/25/2017 1442   TIBC 397 10/29/2011 1552   IRONPCTSAT 7 (L) 10/29/2011 1552   Lipid Panel     Component Value Date/Time   CHOL 163 01/25/2018 1417   TRIG 97 01/25/2018 1417   HDL 52 01/25/2018 1417   CHOLHDL 4 05/25/2017 1442   VLDL 17.0 05/25/2017 1442   LDLCALC 92 01/25/2018 1417   Hepatic Function Panel     Component Value Date/Time   PROT 6.8 01/25/2018 1417   ALBUMIN 4.1 01/25/2018 1417   AST 15 01/25/2018 1417   ALT 15 01/25/2018 1417   ALKPHOS 48 01/25/2018 1417    BILITOT 0.3 01/25/2018 1417   BILIDIR 0.1 10/12/2013 1440   IBILI 0.3 10/12/2013 1440      Component Value Date/Time   TSH 2.600 10/12/2017 1122   TSH 2.32 05/25/2017 1442   TSH 3.24 05/19/2016 1550  Results for KRISTLE, WESCH (MRN [id number]) as of 04/14/2018 11:21  Ref. Range 01/25/2018 14:17  Vitamin D, 25-Hydroxy Latest Ref Range: 30.0 - 100.0 ng/mL 46.1    ASSESSMENT AND PLAN: Insulin resistance - Plan: metFORMIN (GLUCOPHAGE) 500 MG tablet  Vitamin D deficiency - Plan: Vitamin D, Ergocalciferol, (DRISDOL) 50000 units CAPS capsule  At risk for diabetes mellitus  Class 1 obesity with serious comorbidity and body mass index (BMI) of 34.0 to 34.9 in adult, unspecified obesity type  PLAN:  Insulin Resistance [place] will continue to work on weight loss, exercise, and decreasing simple carbohydrates in her diet to help decrease the risk of diabetes. We dicussed metformin including benefits and risks. She was informed that eating too many simple carbohydrates or too many calories at one sitting increases the likelihood of GI side effects. Suzi agrees to continue taking metformin 500 mg BID #60 and we will refill for 1 month. She is to add evening dose of metformin at dinner. Jocabed agrees to follow up with our clinic in 2 to 3 weeks as directed to monitor her progress.  Diabetes risk counselling Lorrayne was given extended (15 minutes) diabetes prevention counseling today. She is 47 y.o. female and has risk factors for diabetes including obesity and insulin resistance. We discussed intensive lifestyle modifications today with an emphasis on weight loss as well as increasing exercise and decreasing simple carbohydrates in her diet.  Vitamin D Deficiency Ravonda was informed that low vitamin D levels contributes to fatigue and are associated with obesity, breast, and colon cancer. Blondina agrees to continue taking prescription Vit D @50 ,000 IU every week #4 and we will refill for  1 month. She will follow up for routine testing of vitamin D, at least 2-3 times per year. She was  informed of the risk of over-replacement of vitamin D and agrees to not increase her dose unless she discusses this with [place] first. Daven agrees to follow up with our clinic in 2 to 3 weeks.  Obesity Yanilen is currently in the action stage of change. As such, her goal is to continue with weight loss efforts She has agreed to keep a food journal with 1500-1600 calories and 85+ grams of protein daily Zoi has been instructed to work up to a goal of 150 minutes of combined cardio and strengthening exercise per week for weight loss and overall health benefits. We discussed the following Behavioral Modification Strategies today: ways to avoid boredom eating and better snacking choices   Arynn has agreed to follow up with our clinic in 2 to 3 weeks. She was informed of the importance of frequent follow up visits to maximize her success with intensive lifestyle modifications for her multiple health conditions.   OBESITY BEHAVIORAL INTERVENTION VISIT  Today's visit was # 11   Starting weight: 233 lbs Starting date: 10/12/17 Today's weight : 219 lbs  Today's date: 04/13/2018 Total lbs lost to date: 14    ASK: We discussed the diagnosis of obesity with [name] today and Viola agreed to give [place] permission to discuss obesity behavioral modification therapy today.  ASSESS: Annise has the diagnosis of obesity and her BMI today is 34.29 Evangelia is in the action stage of change   ADVISE: Rubby was educated on the multiple health risks of obesity as well as the benefit of weight loss to improve her health. She was advised of the need for long term treatment and the importance of lifestyle modifications.  AGREE: Multiple dietary modification options and treatment options were discussed and  Damyiah agreed to the above obesity treatment plan.  Connie Blanchard, am acting  as transcriptionist for Abby Potash, PA-C I, Abby Potash, PA-C have reviewed above note and agree with its content

## 2018-04-27 ENCOUNTER — Ambulatory Visit (INDEPENDENT_AMBULATORY_CARE_PROVIDER_SITE_OTHER): Payer: 59 | Admitting: Physician Assistant

## 2018-04-27 ENCOUNTER — Encounter (INDEPENDENT_AMBULATORY_CARE_PROVIDER_SITE_OTHER): Payer: Self-pay | Admitting: Physician Assistant

## 2018-04-27 VITALS — BP 126/84 | HR 68 | Temp 98.2°F | Ht 67.0 in | Wt 219.0 lb

## 2018-04-27 DIAGNOSIS — E669 Obesity, unspecified: Secondary | ICD-10-CM

## 2018-04-27 DIAGNOSIS — Z9189 Other specified personal risk factors, not elsewhere classified: Secondary | ICD-10-CM

## 2018-04-27 DIAGNOSIS — E559 Vitamin D deficiency, unspecified: Secondary | ICD-10-CM

## 2018-04-27 DIAGNOSIS — E8881 Metabolic syndrome: Secondary | ICD-10-CM

## 2018-04-27 DIAGNOSIS — Z6834 Body mass index (BMI) 34.0-34.9, adult: Secondary | ICD-10-CM

## 2018-04-27 DIAGNOSIS — E88819 Insulin resistance, unspecified: Secondary | ICD-10-CM

## 2018-04-27 MED ORDER — VITAMIN D (ERGOCALCIFEROL) 1.25 MG (50000 UNIT) PO CAPS: 50000.0000 [IU] | ORAL_CAPSULE | ORAL | 0 refills | Status: DC

## 2018-04-27 NOTE — Progress Notes (Signed)
Office: [phone]  /  Fax: [phone]   HPI:   Chief Complaint: OBESITY Connie Blanchard is here to discuss her progress with her obesity treatment plan. She is on the keep a food journal with 1500-1600 calories and 85+ grams of protein daily and is following her eating plan approximately 75 % of the time. She states she is on the treadmill and lifting weights for 30 minutes 3 times per week. [name] did well with weight maintenance. She reports that she continues to have some sweet cravings. She is also working out more and doing Editor, commissioning.  Her weight is 219 lb (99.3 kg) today and has not lost weight since her last visit. She has lost 14 lbs since starting treatment with [place].  Insulin Resistance Connie Blanchard has a diagnosis of insulin resistance based on her elevated fasting insulin level >5. Although [name]'s blood glucose readings are still under good control, insulin resistance puts her at greater risk of metabolic syndrome and diabetes. She notes improvement in polyphagia with added dose of metformin, and she continues to work on diet and exercise to decrease risk of diabetes. She denies nausea, vomiting, or diarrhea.  Vitamin D Deficiency Connie Blanchard has a diagnosis of vitamin D deficiency. She is currently taking prescription Vit D and denies nausea, vomiting or muscle weakness.  At risk for osteopenia and osteoporosis Connie Blanchard is at higher risk of osteopenia and osteoporosis due to vitamin D deficiency.   ALLERGIES: Allergies  Allergen Reactions  . Penicillins Rash    Has patient had a PCN reaction causing immediate rash, facial/tongue/throat swelling, SOB or lightheadedness with hypotension: Unknown Has patient had a PCN reaction causing severe rash involving mucus membranes or skin necrosis: Unknown Has patient had a PCN reaction that required hospitalization: No Has patient had a PCN reaction occurring within the last 10 years: No Childhood allergy  If all of the above  answers are "NO", then may proceed with Cephalosporin use.   . Sulfa Antibiotics Rash    MEDICATIONS: Current Outpatient Medications on File Prior to Visit  Medication Sig Dispense Refill  . b complex vitamins tablet Take 1 tablet by mouth daily.    . cetirizine (ZYRTEC) 10 MG tablet Take 10 mg by mouth daily as needed for allergies.    Marland Kitchen esomeprazole (NEXIUM) 40 MG capsule TAKE 1 CAPSULE BY MOUTH DAILY BEFORE BREAKFAST. 90 capsule 0  . metFORMIN (GLUCOPHAGE) 500 MG tablet Take 1 tablet (500 mg total) by mouth 2 (two) times daily with a meal. 60 tablet 0  . Multiple Vitamin (MULTIVITAMIN) tablet Take 1 tablet by mouth daily.      . Probiotic Product (RESTORA) CAPS Take 1 capsule by mouth daily.  12  . triamterene-hydrochlorothiazide (MAXZIDE-25) 37.5-25 MG tablet TAKE 1 TABLET BY MOUTH EVERY DAY 90 tablet 0   No current facility-administered medications on file prior to visit.     PAST MEDICAL HISTORY: Past Medical History:  Diagnosis Date  . Acute bronchitis 06/24/2012  . Acute bronchitis with asthma 06/24/2012  . Allergic state 10/15/2013  . Anxiety   . Back pain 10/15/2013  . Chronic sinusitis 02/2012   current runny nose of clear drainage  . Constipation   . Dental crowns present   . Depression   . GERD (gastroesophageal reflux disease)   . H/O gestational diabetes mellitus, not currently pregnant 07/29/2011   h/o   . Headache    history of migraines  . History of blood transfusion   . Hyperglycemia 05/25/2017  . Hypertension   .  Iron deficiency anemia   . Leg edema   . Obese   . PCOS (polycystic ovarian syndrome)   . Right shoulder pain 05/25/2017    PAST SURGICAL HISTORY: Past Surgical History:  Procedure Laterality Date  . COLONOSCOPY    . DILATION AND CURETTAGE OF UTERUS  1987  . NASAL SEPTOPLASTY W/ TURBINOPLASTY  03/23/2012   Procedure: NASAL SEPTOPLASTY WITH TURBINATE REDUCTION;  Surgeon: Jerrell Belfast, MD;  Location: [facility];  Service:  ENT;  Laterality: Bilateral;  . ROBOTIC ASSISTED TOTAL HYSTERECTOMY WITH SALPINGECTOMY N/A 03/17/2017   Procedure: ROBOTIC ASSISTED TOTAL HYSTERECTOMY WITH SALPINGECTOMY;  Surgeon: Bobbye Charleston, MD;  Location: [facility];  Service: Gynecology;  Laterality: N/A;  . SINUS ENDO W/FUSION  03/23/2012   Procedure: ENDOSCOPIC SINUS SURGERY WITH FUSION NAVIGATION;  Surgeon: Jerrell Belfast, MD;  Location: [facility];  Service: ENT;  Laterality: Bilateral;  with fusion scan  . UPPER GI ENDOSCOPY    . WISDOM TOOTH EXTRACTION      SOCIAL HISTORY: Social History   Tobacco Use  . Smoking status: Former Smoker    Packs/day: 1.00    Years: 19.00    Pack years: 19.00    Last attempt to quit: 07/28/2003    Years since quitting: 14.7  . Smokeless tobacco: Never Used  Substance Use Topics  . Alcohol use: Yes    Comment: occasionally  . Drug use: No    FAMILY HISTORY: Family History  Problem Relation Age of Onset  . Diabetes Mother        type 2  . Hypertension Mother   . Depression Mother   . Mental illness Mother        bi-polar  . Pulmonary embolism Mother   . Obesity Mother   . Heart disease Maternal Grandfather   . Stroke Maternal Grandfather   . Other Father        drug addiction  . Depression Maternal Aunt 46       suicide attempt with pneumonia    ROS: Review of Systems  Constitutional: Negative for weight loss.  Gastrointestinal: Negative for diarrhea, nausea and vomiting.  Musculoskeletal:       Negative muscle weakness  Endo/Heme/Allergies:       Positive polyphagia    PHYSICAL EXAM: Blood pressure 126/84, pulse 68, temperature 98.2 F (36.8 C), temperature source Oral, height 5\' 7"  (1.702 m), weight 219 lb (99.3 kg), last menstrual period 03/03/2017, SpO2 99 %. Body mass index is 34.3 kg/m. Physical Exam  Constitutional: She is oriented to person, place, and time. She appears well-developed and well-nourished.  Cardiovascular: Normal rate.    Pulmonary/Chest: Effort normal.  Musculoskeletal: Normal range of motion.  Neurological: She is oriented to person, place, and time.  Skin: Skin is warm and dry.  Psychiatric: She has a normal mood and affect. Her behavior is normal.  Vitals reviewed.   RECENT LABS AND TESTS: BMET    Component Value Date/Time   NA 140 01/25/2018 1417   K 4.3 01/25/2018 1417   CL 100 01/25/2018 1417   CO2 24 01/25/2018 1417   GLUCOSE 95 01/25/2018 1417   GLUCOSE 80 05/25/2017 1442   BUN 14 01/25/2018 1417   CREATININE 0.90 01/25/2018 1417   CREATININE 0.99 03/31/2016 0952   CALCIUM 8.8 01/25/2018 1417   GFRNONAA 75 01/25/2018 1417   GFRNONAA 68 03/31/2016 0952   GFRAA 87 01/25/2018 1417   GFRAA 78 03/31/2016 0952   Lab Results  Component Value  Date   HGBA1C 5.4 01/25/2018   HGBA1C 5.6 10/12/2017   HGBA1C 5.6 05/25/2017   Lab Results  Component Value Date   INSULIN 11.2 01/25/2018   INSULIN 17.0 10/12/2017   CBC    Component Value Date/Time   WBC 6.0 10/12/2017 1122   WBC 6.9 05/25/2017 1442   RBC 4.47 10/12/2017 1122   RBC 4.41 05/25/2017 1442   HGB 13.6 10/12/2017 1122   HCT 40.9 10/12/2017 1122   PLT 242.0 05/25/2017 1442   MCV 92 10/12/2017 1122   MCH 30.4 10/12/2017 1122   MCH 29.8 03/05/2017 1100   MCHC 33.3 10/12/2017 1122   MCHC 33.2 05/25/2017 1442   RDW 13.6 10/12/2017 1122   LYMPHSABS 1.9 10/12/2017 1122   EOSABS 0.5 (H) 10/12/2017 1122   BASOSABS 0.0 10/12/2017 1122   Iron/TIBC/Ferritin/ %Sat    Component Value Date/Time   IRON 116 05/25/2017 1442   TIBC 397 10/29/2011 1552   IRONPCTSAT 7 (L) 10/29/2011 1552   Lipid Panel     Component Value Date/Time   CHOL 163 01/25/2018 1417   TRIG 97 01/25/2018 1417   HDL 52 01/25/2018 1417   CHOLHDL 4 05/25/2017 1442   VLDL 17.0 05/25/2017 1442   LDLCALC 92 01/25/2018 1417   Hepatic Function Panel     Component Value Date/Time   PROT 6.8 01/25/2018 1417   ALBUMIN 4.1 01/25/2018 1417   AST 15 01/25/2018  1417   ALT 15 01/25/2018 1417   ALKPHOS 48 01/25/2018 1417   BILITOT 0.3 01/25/2018 1417   BILIDIR 0.1 10/12/2013 1440   IBILI 0.3 10/12/2013 1440      Component Value Date/Time   TSH 2.600 10/12/2017 1122   TSH 2.32 05/25/2017 1442   TSH 3.24 05/19/2016 1550  Results for MIKAELA, HILGEMAN (MRN [id number]) as of 04/27/2018 15:23  Ref. Range 01/25/2018 14:17  Vitamin D, 25-Hydroxy Latest Ref Range: 30.0 - 100.0 ng/mL 46.1    ASSESSMENT AND PLAN: Insulin resistance  Vitamin D deficiency - Plan: Vitamin D, Ergocalciferol, (DRISDOL) 50000 units CAPS capsule  At risk for osteoporosis  Class 1 obesity with serious comorbidity and body mass index (BMI) of 34.0 to 34.9 in adult, unspecified obesity type  PLAN:  Insulin Resistance Shaunda will continue to work on weight loss, diet, exercise, and decreasing simple carbohydrates in her diet to help decrease the risk of diabetes. We dicussed metformin including benefits and risks. She was informed that eating too many simple carbohydrates or too many calories at one sitting increases the likelihood of GI side effects. Emmerson agrees to continue her medications and she agrees to follow up with our clinic in 2 weeks as directed to monitor her progress.  Vitamin D Deficiency Lindy was informed that low vitamin D levels contributes to fatigue and are associated with obesity, breast, and colon cancer. Dawnya agrees to continue taking prescription Vit D @50 ,000 IU every week #4 and we will refill for 1 month. She will follow up for routine testing of vitamin D, at least 2-3 times per year. She was informed of the risk of over-replacement of vitamin D and agrees to not increase her dose unless she discusses this with [place] first. Roshni agrees to follow up with our clinic in 2 weeks.  At risk for osteopenia and osteoporosis Jacey was given extended  (15 minutes) osteoporosis prevention counseling today. Callie is at risk for osteopenia and  osteoporsis due to her vitamin D deficiency. She was encouraged to take her vitamin D  and follow her higher calcium diet and increase strengthening exercise to help strengthen her bones and decrease her risk of osteopenia and osteoporosis.  Obesity Vyctoria is currently in the action stage of change. As such, her goal is to continue with weight loss efforts She has agreed to keep a food journal with 1500-1600 calories and 85+ grams of protein daily and follow the Category 3 plan Laparis has been instructed to work up to a goal of 150 minutes of combined cardio and strengthening exercise per week for weight loss and overall health benefits. We discussed the following Behavioral Modification Strategies today: work on meal planning and easy cooking plans and ways to avoid boredom eating   Isaiah has agreed to follow up with our clinic in 2 weeks. She was informed of the importance of frequent follow up visits to maximize her success with intensive lifestyle modifications for her multiple health conditions.   OBESITY BEHAVIORAL INTERVENTION VISIT  Today's visit was # 12   Starting weight: 233 lbs Starting date: 10/12/17 Today's weight : 219 lbs  Today's date: 04/27/2018 Total lbs lost to date: 14    ASK: We discussed the diagnosis of obesity with [name] today and Miel agreed to give [place] permission to discuss obesity behavioral modification therapy today.  ASSESS: Yanelis has the diagnosis of obesity and her BMI today is 34.29 Jentry is in the action stage of change   ADVISE: Vittoria was educated on the multiple health risks of obesity as well as the benefit of weight loss to improve her health. She was advised of the need for long term treatment and the importance of lifestyle modifications.  AGREE: Multiple dietary modification options and treatment options were discussed and  Aviv agreed to the above obesity treatment plan.  Wilhemena Durie, am acting as  transcriptionist for Abby Potash, PA-C I, Abby Potash, PA-C have reviewed above note and agree with its content

## 2018-04-28 ENCOUNTER — Other Ambulatory Visit (INDEPENDENT_AMBULATORY_CARE_PROVIDER_SITE_OTHER): Payer: Self-pay | Admitting: Family Medicine

## 2018-04-28 DIAGNOSIS — E8881 Metabolic syndrome: Secondary | ICD-10-CM

## 2018-04-28 DIAGNOSIS — E88819 Insulin resistance, unspecified: Secondary | ICD-10-CM

## 2018-05-08 ENCOUNTER — Other Ambulatory Visit (INDEPENDENT_AMBULATORY_CARE_PROVIDER_SITE_OTHER): Payer: Self-pay | Admitting: Family Medicine

## 2018-05-08 DIAGNOSIS — E8881 Metabolic syndrome: Secondary | ICD-10-CM

## 2018-05-10 ENCOUNTER — Ambulatory Visit (INDEPENDENT_AMBULATORY_CARE_PROVIDER_SITE_OTHER): Payer: 59 | Admitting: Physician Assistant

## 2018-05-10 ENCOUNTER — Encounter (INDEPENDENT_AMBULATORY_CARE_PROVIDER_SITE_OTHER): Payer: Self-pay

## 2018-05-23 ENCOUNTER — Encounter (INDEPENDENT_AMBULATORY_CARE_PROVIDER_SITE_OTHER): Payer: Self-pay | Admitting: Physician Assistant

## 2018-05-23 ENCOUNTER — Ambulatory Visit (INDEPENDENT_AMBULATORY_CARE_PROVIDER_SITE_OTHER): Payer: 59 | Admitting: Physician Assistant

## 2018-05-23 VITALS — BP 129/91 | HR 63 | Temp 98.3°F | Ht 67.0 in | Wt 222.0 lb

## 2018-05-23 DIAGNOSIS — E8881 Metabolic syndrome: Secondary | ICD-10-CM

## 2018-05-23 DIAGNOSIS — Z9189 Other specified personal risk factors, not elsewhere classified: Secondary | ICD-10-CM | POA: Diagnosis not present

## 2018-05-23 DIAGNOSIS — E559 Vitamin D deficiency, unspecified: Secondary | ICD-10-CM | POA: Diagnosis not present

## 2018-05-23 DIAGNOSIS — Z6834 Body mass index (BMI) 34.0-34.9, adult: Secondary | ICD-10-CM

## 2018-05-23 DIAGNOSIS — E669 Obesity, unspecified: Secondary | ICD-10-CM

## 2018-05-23 MED ORDER — METFORMIN HCL 500 MG PO TABS: 500.0000 mg | ORAL_TABLET | Freq: Two times a day (BID) | ORAL | 0 refills | Status: DC

## 2018-05-23 MED ORDER — VITAMIN D (ERGOCALCIFEROL) 1.25 MG (50000 UNIT) PO CAPS: 50000.0000 [IU] | ORAL_CAPSULE | ORAL | 0 refills | Status: DC

## 2018-05-24 NOTE — Progress Notes (Signed)
Office: [phone]  /  Fax: [phone]   HPI:   Chief Complaint: OBESITY Connie Blanchard is here to discuss her progress with her obesity treatment plan. She is on the Category 3 plan and is following her eating plan approximately 10 % of the time. She states she is walking 30 minutes 1 times per week. [name] struggled to follow the plan due to an increase in cravings after indulging in simple carbs. She has recently started walking and she is ready to get back on track.  Her weight is 222 lb (100.7 kg) today and has had a weight gain of 3 pounds over a period of 4 weeks since her last visit. She has lost 11 lbs since starting treatment with [place].  Vitamin D deficiency Connie Blanchard has a diagnosis of vitamin D deficiency. She is currently taking vit D and denies nausea, vomiting or muscle weakness.  At risk for osteopenia and osteoporosis Connie Blanchard is at higher risk of osteopenia and osteoporosis due to vitamin D deficiency.   Insulin Resistance Connie Blanchard has a diagnosis of insulin resistance based on her elevated fasting insulin level >5. Although Connie Blanchard's blood glucose readings are still under good control, insulin resistance puts her at greater risk of metabolic syndrome and diabetes. She is taking metformin currently and continues to work on diet and exercise to decrease risk of diabetes. She reports polyphagia, but is forgetting her 2nd dose of metformin.  ALLERGIES: Allergies  Allergen Reactions  . Penicillins Rash    Has patient had a PCN reaction causing immediate rash, facial/tongue/throat swelling, SOB or lightheadedness with hypotension: Unknown Has patient had a PCN reaction causing severe rash involving mucus membranes or skin necrosis: Unknown Has patient had a PCN reaction that required hospitalization: No Has patient had a PCN reaction occurring within the last 10 years: No Childhood allergy  If all of the above answers are "NO", then may proceed with Cephalosporin use.   .  Sulfa Antibiotics Rash    MEDICATIONS: Current Outpatient Medications on File Prior to Visit  Medication Sig Dispense Refill  . b complex vitamins tablet Take 1 tablet by mouth daily.    . cetirizine (ZYRTEC) 10 MG tablet Take 10 mg by mouth daily as needed for allergies.    Marland Kitchen esomeprazole (NEXIUM) 40 MG capsule TAKE 1 CAPSULE BY MOUTH DAILY BEFORE BREAKFAST. 90 capsule 0  . Multiple Vitamin (MULTIVITAMIN) tablet Take 1 tablet by mouth daily.      . Probiotic Product (RESTORA) CAPS Take 1 capsule by mouth daily.  12  . triamterene-hydrochlorothiazide (MAXZIDE-25) 37.5-25 MG tablet TAKE 1 TABLET BY MOUTH EVERY DAY 90 tablet 0   No current facility-administered medications on file prior to visit.     PAST MEDICAL HISTORY: Past Medical History:  Diagnosis Date  . Acute bronchitis 06/24/2012  . Acute bronchitis with asthma 06/24/2012  . Allergic state 10/15/2013  . Anxiety   . Back pain 10/15/2013  . Chronic sinusitis 02/2012   current runny nose of clear drainage  . Constipation   . Dental crowns present   . Depression   . GERD (gastroesophageal reflux disease)   . H/O gestational diabetes mellitus, not currently pregnant 07/29/2011   h/o   . Headache    history of migraines  . History of blood transfusion   . Hyperglycemia 05/25/2017  . Hypertension   . Iron deficiency anemia   . Leg edema   . Obese   . PCOS (polycystic ovarian syndrome)   . Right shoulder pain  05/25/2017    PAST SURGICAL HISTORY: Past Surgical History:  Procedure Laterality Date  . COLONOSCOPY    . DILATION AND CURETTAGE OF UTERUS  1987  . NASAL SEPTOPLASTY W/ TURBINOPLASTY  03/23/2012   Procedure: NASAL SEPTOPLASTY WITH TURBINATE REDUCTION;  Surgeon: Jerrell Belfast, MD;  Location: [facility];  Service: ENT;  Laterality: Bilateral;  . ROBOTIC ASSISTED TOTAL HYSTERECTOMY WITH SALPINGECTOMY N/A 03/17/2017   Procedure: ROBOTIC ASSISTED TOTAL HYSTERECTOMY WITH SALPINGECTOMY;  Surgeon: Bobbye Charleston, MD;  Location: [facility];  Service: Gynecology;  Laterality: N/A;  . SINUS ENDO W/FUSION  03/23/2012   Procedure: ENDOSCOPIC SINUS SURGERY WITH FUSION NAVIGATION;  Surgeon: Jerrell Belfast, MD;  Location: [facility];  Service: ENT;  Laterality: Bilateral;  with fusion scan  . UPPER GI ENDOSCOPY    . WISDOM TOOTH EXTRACTION      SOCIAL HISTORY: Social History   Tobacco Use  . Smoking status: Former Smoker    Packs/day: 1.00    Years: 19.00    Pack years: 19.00    Last attempt to quit: 07/28/2003    Years since quitting: 14.8  . Smokeless tobacco: Never Used  Substance Use Topics  . Alcohol use: Yes    Comment: occasionally  . Drug use: No    FAMILY HISTORY: Family History  Problem Relation Age of Onset  . Diabetes Mother        type 2  . Hypertension Mother   . Depression Mother   . Mental illness Mother        bi-polar  . Pulmonary embolism Mother   . Obesity Mother   . Heart disease Maternal Grandfather   . Stroke Maternal Grandfather   . Other Father        drug addiction  . Depression Maternal Aunt 46       suicide attempt with pneumonia    ROS: Review of Systems  Constitutional: Negative for weight loss.  Gastrointestinal: Negative for nausea and vomiting.  Musculoskeletal:       Negative for muscle weakness.  Endo/Heme/Allergies:       Positive for polyphagia.    PHYSICAL EXAM: Blood pressure (!) 129/91, pulse 63, temperature 98.3 F (36.8 C), temperature source Oral, height 5\' 7"  (1.702 m), weight 222 lb (100.7 kg), last menstrual period 03/03/2017, SpO2 99 %. Body mass index is 34.77 kg/m. Physical Exam  Constitutional: She is oriented to person, place, and time. She appears well-developed and well-nourished.  Cardiovascular: Normal rate.  Pulmonary/Chest: Effort normal.  Musculoskeletal: Normal range of motion.  Neurological: She is oriented to person, place, and time.  Skin: Skin is warm and dry.  Psychiatric: She has a  normal mood and affect. Her behavior is normal.  Vitals reviewed.   RECENT LABS AND TESTS: BMET    Component Value Date/Time   NA 140 01/25/2018 1417   K 4.3 01/25/2018 1417   CL 100 01/25/2018 1417   CO2 24 01/25/2018 1417   GLUCOSE 95 01/25/2018 1417   GLUCOSE 80 05/25/2017 1442   BUN 14 01/25/2018 1417   CREATININE 0.90 01/25/2018 1417   CREATININE 0.99 03/31/2016 0952   CALCIUM 8.8 01/25/2018 1417   GFRNONAA 75 01/25/2018 1417   GFRNONAA 68 03/31/2016 0952   GFRAA 87 01/25/2018 1417   GFRAA 78 03/31/2016 0952   Lab Results  Component Value Date   HGBA1C 5.4 01/25/2018   HGBA1C 5.6 10/12/2017   HGBA1C 5.6 05/25/2017   Lab Results  Component Value Date  INSULIN 11.2 01/25/2018   INSULIN 17.0 10/12/2017   CBC    Component Value Date/Time   WBC 6.0 10/12/2017 1122   WBC 6.9 05/25/2017 1442   RBC 4.47 10/12/2017 1122   RBC 4.41 05/25/2017 1442   HGB 13.6 10/12/2017 1122   HCT 40.9 10/12/2017 1122   PLT 242.0 05/25/2017 1442   MCV 92 10/12/2017 1122   MCH 30.4 10/12/2017 1122   MCH 29.8 03/05/2017 1100   MCHC 33.3 10/12/2017 1122   MCHC 33.2 05/25/2017 1442   RDW 13.6 10/12/2017 1122   LYMPHSABS 1.9 10/12/2017 1122   EOSABS 0.5 (H) 10/12/2017 1122   BASOSABS 0.0 10/12/2017 1122   Iron/TIBC/Ferritin/ %Sat    Component Value Date/Time   IRON 116 05/25/2017 1442   TIBC 397 10/29/2011 1552   IRONPCTSAT 7 (L) 10/29/2011 1552   Lipid Panel     Component Value Date/Time   CHOL 163 01/25/2018 1417   TRIG 97 01/25/2018 1417   HDL 52 01/25/2018 1417   CHOLHDL 4 05/25/2017 1442   VLDL 17.0 05/25/2017 1442   LDLCALC 92 01/25/2018 1417   Hepatic Function Panel     Component Value Date/Time   PROT 6.8 01/25/2018 1417   ALBUMIN 4.1 01/25/2018 1417   AST 15 01/25/2018 1417   ALT 15 01/25/2018 1417   ALKPHOS 48 01/25/2018 1417   BILITOT 0.3 01/25/2018 1417   BILIDIR 0.1 10/12/2013 1440   IBILI 0.3 10/12/2013 1440      Component Value Date/Time   TSH  2.600 10/12/2017 1122   TSH 2.32 05/25/2017 1442   TSH 3.24 05/19/2016 1550   Results for ROXANNA, MCEVER (MRN [id number]) as of 05/24/2018 10:25  Ref. Range 01/25/2018 14:17  Vitamin D, 25-Hydroxy Latest Ref Range: 30.0 - 100.0 ng/mL 46.1   ASSESSMENT AND PLAN: Vitamin D deficiency - Plan: Vitamin D, Ergocalciferol, (DRISDOL) 50000 units CAPS capsule  Insulin resistance - Plan: metFORMIN (GLUCOPHAGE) 500 MG tablet  At risk for osteoporosis  Class 1 obesity with serious comorbidity and body mass index (BMI) of 34.0 to 34.9 in adult, unspecified obesity type  PLAN:  Vitamin D Deficiency Connie Blanchard was informed that low vitamin D levels contributes to fatigue and are associated with obesity, breast, and colon cancer. She agrees to continue to take prescription Vit D @50 ,000 IU every week #4 with no refills and will follow up for routine testing of vitamin D, at least 2-3 times per year. She was informed of the risk of over-replacement of vitamin D and agrees to not increase her dose unless she discusses this with [place] first. We will check her labs at the next visit. She agreed to follow up in 2 weeks.  At risk for osteopenia and osteoporosis Connie Blanchard was given extended (15 minutes) osteoporosis prevention counseling today. Connie Blanchard is at risk for osteopenia and osteoporosis due to her vitamin D deficiency. She was encouraged to take her vitamin D and follow her higher calcium diet and increase strengthening exercise to help strengthen her bones and decrease her risk of osteopenia and osteoporosis.  Insulin Resistance Connie Blanchard will continue to work on weight loss, exercise, and decreasing simple carbohydrates in her diet to help decrease the risk of diabetes. She was informed that eating too many simple carbohydrates or too many calories at one sitting increases the likelihood of GI side effects. Connie Blanchard agrees to continue metformin 500mg , 1 PO BID #30 with no refills and a prescription was  written today. Connie Blanchard agreed to follow up with [place]  as directed to monitor her progress in 2 weeks. We will check labs at the agreed upon time.  Obesity Connie Blanchard is currently in the action stage of change. As such, her goal is to continue with weight loss efforts. She has agreed to follow the Category 3 plan. Connie Blanchard has been instructed to work up to a goal of 150 minutes of combined cardio and strengthening exercise per week for weight loss and overall health benefits. We discussed the following Behavioral Modification Strategies today: increasing lean protein intake and decreasing simple carbohydrates.   Connie Blanchard has agreed to follow up with our clinic in 2 weeks. She was informed of the importance of frequent follow up visits to maximize her success with intensive lifestyle modifications for her multiple health conditions.   OBESITY BEHAVIORAL INTERVENTION VISIT  Today's visit was # 13   Starting weight: 233 lbs  Starting date: 10/12/17 Today's weight : Weight: 222 lb (100.7 kg)  Today's date: 05/23/2018 Total lbs lost to date: 11  ASK: We discussed the diagnosis of obesity with Connie Blanchard today and Connie Blanchard agreed to give [place] permission to discuss obesity behavioral modification therapy today.  ASSESS: Connie Blanchard has the diagnosis of obesity and her BMI today is 34.76. Connie Blanchard is in the action stage of change.   ADVISE: Connie Blanchard was educated on the multiple health risks of obesity as well as the benefit of weight loss to improve her health. She was advised of the need for long term treatment and the importance of lifestyle modifications to improve her current health and to decrease her risk of future health problems.  AGREE: Multiple dietary modification options and treatment options were discussed and Connie Blanchard agreed to follow the recommendations documented in the above note.  ARRANGE: Connie Blanchard was educated on the importance of frequent visits to treat obesity as  outlined per CMS and USPSTF guidelines and agreed to schedule her next follow up appointment today.  Lenward Chancellor, am acting as transcriptionist for Abby Potash, PA-C I, Abby Potash, PA-C have reviewed above note and agree with its content

## 2018-05-31 ENCOUNTER — Encounter: Payer: Self-pay | Admitting: Family Medicine

## 2018-05-31 ENCOUNTER — Ambulatory Visit (INDEPENDENT_AMBULATORY_CARE_PROVIDER_SITE_OTHER): Payer: 59 | Admitting: Family Medicine

## 2018-05-31 DIAGNOSIS — Z23 Encounter for immunization: Secondary | ICD-10-CM | POA: Diagnosis not present

## 2018-05-31 DIAGNOSIS — Z0000 Encounter for general adult medical examination without abnormal findings: Secondary | ICD-10-CM

## 2018-05-31 DIAGNOSIS — R739 Hyperglycemia, unspecified: Secondary | ICD-10-CM | POA: Diagnosis not present

## 2018-05-31 DIAGNOSIS — R600 Localized edema: Secondary | ICD-10-CM

## 2018-05-31 DIAGNOSIS — E782 Mixed hyperlipidemia: Secondary | ICD-10-CM

## 2018-05-31 NOTE — Assessment & Plan Note (Signed)
hgba1c acceptable, minimize simple carbs. Increase exercise as tolerated.  

## 2018-05-31 NOTE — Progress Notes (Signed)
Subjective:    Patient ID: Connie Blanchard, female    DOB: Nov 29, 1970, 47 y.o.   MRN: [id number]  No chief complaint on file.   HPI Patient is in today for annual preventative exam. She is doing well.  No recent febrile illness or hospitalization.  She continues to work full-time.  She stays active and tries to maintain a heart healthy diet.  No polyuria or polydipsia.  She is managing her activities of daily living well.Denies CP/palp/SOB/HA/congestion/fevers/GI or GU c/o. Taking meds as prescribed Past Medical History:  Diagnosis Date  . Acute bronchitis 06/24/2012  . Acute bronchitis with asthma 06/24/2012  . Allergic state 10/15/2013  . Anxiety   . Back pain 10/15/2013  . Chronic sinusitis 02/2012   current runny nose of clear drainage  . Constipation   . Dental crowns present   . Depression   . GERD (gastroesophageal reflux disease)   . H/O gestational diabetes mellitus, not currently pregnant 07/29/2011   h/o   . Headache    history of migraines  . History of blood transfusion   . Hyperglycemia 05/25/2017  . Hypertension   . Iron deficiency anemia   . Leg edema   . Obese   . PCOS (polycystic ovarian syndrome)   . Right shoulder pain 05/25/2017    Past Surgical History:  Procedure Laterality Date  . ABDOMINAL HYSTERECTOMY  02/2017   anemia, fibroids  . COLONOSCOPY    . DILATION AND CURETTAGE OF UTERUS  1987  . NASAL SEPTOPLASTY W/ TURBINOPLASTY  03/23/2012   Procedure: NASAL SEPTOPLASTY WITH TURBINATE REDUCTION;  Surgeon: Jerrell Belfast, MD;  Location: [facility];  Service: ENT;  Laterality: Bilateral;  . ROBOTIC ASSISTED TOTAL HYSTERECTOMY WITH SALPINGECTOMY N/A 03/17/2017   Procedure: ROBOTIC ASSISTED TOTAL HYSTERECTOMY WITH SALPINGECTOMY;  Surgeon: Bobbye Charleston, MD;  Location: [facility];  Service: Gynecology;  Laterality: N/A;  . SINUS ENDO W/FUSION  03/23/2012   Procedure: ENDOSCOPIC SINUS SURGERY WITH FUSION NAVIGATION;  Surgeon: Jerrell Belfast,  MD;  Location: [facility];  Service: ENT;  Laterality: Bilateral;  with fusion scan  . UPPER GI ENDOSCOPY    . WISDOM TOOTH EXTRACTION      Family History  Problem Relation Age of Onset  . Diabetes Mother        type 2  . Hypertension Mother   . Depression Mother   . Mental illness Mother        bi-polar  . Pulmonary embolism Mother   . Obesity Mother   . Heart disease Maternal Grandfather   . Stroke Maternal Grandfather   . Other Father        drug addiction  . Depression Maternal Aunt 46       suicide attempt with pneumonia    Social History   Socioeconomic History  . Marital status: Married    Spouse name: Dellis Filbert  . Number of children: 4  . Years of education: Not on file  . Highest education level: Not on file  Occupational History  . Occupation: Therapist, sports  Social Needs  . Financial resource strain: Not on file  . Food insecurity:    Worry: Not on file    Inability: Not on file  . Transportation needs:    Medical: Not on file    Non-medical: Not on file  Tobacco Use  . Smoking status: Former Smoker    Packs/day: 1.00    Years: 19.00    Pack years: 19.00  Last attempt to quit: 07/28/2003    Years since quitting: 14.8  . Smokeless tobacco: Never Used  Substance and Sexual Activity  . Alcohol use: Yes    Comment: occasionally  . Drug use: No  . Sexual activity: Yes    Partners: Male    Birth control/protection: None    Comment: lives with husband, stepson 40, son and daughter. no dietary restrictions. eating heart healthy, walking more  Lifestyle  . Physical activity:    Days per week: Not on file    Minutes per session: Not on file  . Stress: Not on file  Relationships  . Social connections:    Talks on phone: Not on file    Gets together: Not on file    Attends religious service: Not on file    Active member of club or organization: Not on file    Attends meetings of clubs or organizations: Not on file    Relationship status: Not on  file  . Intimate partner violence:    Fear of current or ex partner: Not on file    Emotionally abused: Not on file    Physically abused: Not on file    Forced sexual activity: Not on file  Other Topics Concern  . Not on file  Social History Narrative  . Not on file    Outpatient Medications Prior to Visit  Medication Sig Dispense Refill  . b complex vitamins tablet Take 1 tablet by mouth daily.    . cetirizine (ZYRTEC) 10 MG tablet Take 10 mg by mouth daily as needed for allergies.    Marland Kitchen esomeprazole (NEXIUM) 40 MG capsule TAKE 1 CAPSULE BY MOUTH DAILY BEFORE BREAKFAST. 90 capsule 0  . metFORMIN (GLUCOPHAGE) 500 MG tablet Take 1 tablet (500 mg total) by mouth 2 (two) times daily with a meal. 60 tablet 0  . Multiple Vitamin (MULTIVITAMIN) tablet Take 1 tablet by mouth daily.      . Vitamin D, Ergocalciferol, (DRISDOL) 50000 units CAPS capsule Take 1 capsule (50,000 Units total) by mouth every 7 (seven) days. 4 capsule 0  . Probiotic Product (RESTORA) CAPS Take 1 capsule by mouth daily.  12  . triamterene-hydrochlorothiazide (MAXZIDE-25) 37.5-25 MG tablet TAKE 1 TABLET BY MOUTH EVERY DAY 90 tablet 0   No facility-administered medications prior to visit.     Allergies  Allergen Reactions  . Penicillins Rash    Has patient had a PCN reaction causing immediate rash, facial/tongue/throat swelling, SOB or lightheadedness with hypotension: Unknown Has patient had a PCN reaction causing severe rash involving mucus membranes or skin necrosis: Unknown Has patient had a PCN reaction that required hospitalization: No Has patient had a PCN reaction occurring within the last 10 years: No Childhood allergy  If all of the above answers are "NO", then may proceed with Cephalosporin use.   . Sulfa Antibiotics Rash    Review of Systems  Constitutional: Negative for fever and malaise/fatigue.  HENT: Negative for congestion.   Eyes: Negative for blurred vision.  Respiratory: Negative for  shortness of breath.   Cardiovascular: Negative for chest pain, palpitations and leg swelling.  Gastrointestinal: Negative for abdominal pain, blood in stool and nausea.  Genitourinary: Negative for dysuria and frequency.  Musculoskeletal: Negative for falls.  Skin: Negative for rash.  Neurological: Negative for dizziness, loss of consciousness and headaches.  Endo/Heme/Allergies: Negative for environmental allergies.  Psychiatric/Behavioral: Negative for depression. The patient is not nervous/anxious.        Objective:  Physical Exam  Constitutional: She is oriented to person, place, and time. No distress.  HENT:  Head: Normocephalic and atraumatic.  Right Ear: External ear normal.  Left Ear: External ear normal.  Nose: Nose normal.  Mouth/Throat: Oropharynx is clear and moist. No oropharyngeal exudate.  Eyes: Pupils are equal, round, and reactive to light. Conjunctivae are normal. Right eye exhibits no discharge. Left eye exhibits no discharge. No scleral icterus.  Neck: Normal range of motion. Neck supple. No thyromegaly present.  Cardiovascular: Normal rate, regular rhythm, normal heart sounds and intact distal pulses.  No murmur heard. Pulmonary/Chest: Effort normal and breath sounds normal. No respiratory distress. She has no wheezes. She has no rales.  Abdominal: Soft. Bowel sounds are normal. She exhibits no distension and no mass. There is no tenderness.  Musculoskeletal: Normal range of motion. She exhibits no edema or tenderness.  Lymphadenopathy:    She has no cervical adenopathy.  Neurological: She is alert and oriented to person, place, and time. She has normal reflexes. She displays normal reflexes. No cranial nerve deficit. Coordination normal.  Skin: Skin is warm and dry. No rash noted. She is not diaphoretic.    BP 110/78 (BP Location: Left Arm, Patient Position: Sitting, Cuff Size: Normal)   Pulse 67   Temp 98.4 F (36.9 C) (Oral)   Resp 18   Ht 5\' 7"   (1.702 m)   Wt 223 lb 6.4 oz (101.3 kg)   LMP 03/03/2017 (Exact Date)   SpO2 97%   BMI 34.99 kg/m  Wt Readings from Last 3 Encounters:  05/31/18 223 lb 6.4 oz (101.3 kg)  05/23/18 222 lb (100.7 kg)  04/27/18 219 lb (99.3 kg)     Lab Results  Component Value Date   WBC 6.0 10/12/2017   HGB 13.6 10/12/2017   HCT 40.9 10/12/2017   PLT 242.0 05/25/2017   GLUCOSE 95 01/25/2018   CHOL 163 01/25/2018   TRIG 97 01/25/2018   HDL 52 01/25/2018   LDLCALC 92 01/25/2018   ALT 15 01/25/2018   AST 15 01/25/2018   NA 140 01/25/2018   K 4.3 01/25/2018   CL 100 01/25/2018   CREATININE 0.90 01/25/2018   BUN 14 01/25/2018   CO2 24 01/25/2018   TSH 2.600 10/12/2017   HGBA1C 5.4 01/25/2018    Lab Results  Component Value Date   TSH 2.600 10/12/2017   Lab Results  Component Value Date   WBC 6.0 10/12/2017   HGB 13.6 10/12/2017   HCT 40.9 10/12/2017   MCV 92 10/12/2017   PLT 242.0 05/25/2017   Lab Results  Component Value Date   NA 140 01/25/2018   K 4.3 01/25/2018   CO2 24 01/25/2018   GLUCOSE 95 01/25/2018   BUN 14 01/25/2018   CREATININE 0.90 01/25/2018   BILITOT 0.3 01/25/2018   ALKPHOS 48 01/25/2018   AST 15 01/25/2018   ALT 15 01/25/2018   PROT 6.8 01/25/2018   ALBUMIN 4.1 01/25/2018   CALCIUM 8.8 01/25/2018   GFR 64.23 05/25/2017   Lab Results  Component Value Date   CHOL 163 01/25/2018   Lab Results  Component Value Date   HDL 52 01/25/2018   Lab Results  Component Value Date   LDLCALC 92 01/25/2018   Lab Results  Component Value Date   TRIG 97 01/25/2018   Lab Results  Component Value Date   CHOLHDL 4 05/25/2017   Lab Results  Component Value Date   HGBA1C 5.4 01/25/2018  Assessment & Plan:   Problem List Items Addressed This Visit    Preventative health care    Patient encouraged to maintain heart healthy diet, regular exercise, adequate sleep. Consider daily probiotics. Take medications as prescribed. Encouraged ACP documents  completed and returned. Labs reviewed.       Pedal edema    Improved with dietary changes       Hyperlipidemia, mixed    Encouraged heart healthy diet, increase exercise, avoid trans fats, consider a krill oil cap daily      Hyperglycemia    hgba1c acceptable, minimize simple carbs. Increase exercise as tolerated.          I have discontinued Lucas Mallow. Schwebke's RESTORA and triamterene-hydrochlorothiazide. I am also having her maintain her multivitamin, esomeprazole, b complex vitamins, cetirizine, Vitamin D (Ergocalciferol), and metFORMIN.  No orders of the defined types were placed in this encounter.    Penni Homans, MD

## 2018-05-31 NOTE — Patient Instructions (Signed)
Preventive Care 40-64 Years, Female Preventive care refers to lifestyle choices and visits with your health care provider that can promote health and wellness. What does preventive care include?  A yearly physical exam. This is also called an annual well check.  Dental exams once or twice a year.  Routine eye exams. Ask your health care provider how often you should have your eyes checked.  Personal lifestyle choices, including: ? Daily care of your teeth and gums. ? Regular physical activity. ? Eating a healthy diet. ? Avoiding tobacco and drug use. ? Limiting alcohol use. ? Practicing safe sex. ? Taking low-dose aspirin daily starting at age 58. ? Taking vitamin and mineral supplements as recommended by your health care provider. What happens during an annual well check? The services and screenings done by your health care provider during your annual well check will depend on your age, overall health, lifestyle risk factors, and family history of disease. Counseling Your health care provider may ask you questions about your:  Alcohol use.  Tobacco use.  Drug use.  Emotional well-being.  Home and relationship well-being.  Sexual activity.  Eating habits.  Work and work Statistician.  Method of birth control.  Menstrual cycle.  Pregnancy history.  Screening You may have the following tests or measurements:  Height, weight, and BMI.  Blood pressure.  Lipid and cholesterol levels. These may be checked every 5 years, or more frequently if you are over 81 years old.  Skin check.  Lung cancer screening. You may have this screening every year starting at age 78 if you have a 30-pack-year history of smoking and currently smoke or have quit within the past 15 years.  Fecal occult blood test (FOBT) of the stool. You may have this test every year starting at age 65.  Flexible sigmoidoscopy or colonoscopy. You may have a sigmoidoscopy every 5 years or a colonoscopy  every 10 years starting at age 30.  Hepatitis C blood test.  Hepatitis B blood test.  Sexually transmitted disease (STD) testing.  Diabetes screening. This is done by checking your blood sugar (glucose) after you have not eaten for a while (fasting). You may have this done every 1-3 years.  Mammogram. This may be done every 1-2 years. Talk to your health care provider about when you should start having regular mammograms. This may depend on whether you have a family history of breast cancer.  BRCA-related cancer screening. This may be done if you have a family history of breast, ovarian, tubal, or peritoneal cancers.  Pelvic exam and Pap test. This may be done every 3 years starting at age 80. Starting at age 36, this may be done every 5 years if you have a Pap test in combination with an HPV test.  Bone density scan. This is done to screen for osteoporosis. You may have this scan if you are at high risk for osteoporosis.  Discuss your test results, treatment options, and if necessary, the need for more tests with your health care provider. Vaccines Your health care provider may recommend certain vaccines, such as:  Influenza vaccine. This is recommended every year.  Tetanus, diphtheria, and acellular pertussis (Tdap, Td) vaccine. You may need a Td booster every 10 years.  Varicella vaccine. You may need this if you have not been vaccinated.  Zoster vaccine. You may need this after age 5.  Measles, mumps, and rubella (MMR) vaccine. You may need at least one dose of MMR if you were born in  1957 or later. You may also need a second dose.  Pneumococcal 13-valent conjugate (PCV13) vaccine. You may need this if you have certain conditions and were not previously vaccinated.  Pneumococcal polysaccharide (PPSV23) vaccine. You may need one or two doses if you smoke cigarettes or if you have certain conditions.  Meningococcal vaccine. You may need this if you have certain  conditions.  Hepatitis A vaccine. You may need this if you have certain conditions or if you travel or work in places where you may be exposed to hepatitis A.  Hepatitis B vaccine. You may need this if you have certain conditions or if you travel or work in places where you may be exposed to hepatitis B.  Haemophilus influenzae type b (Hib) vaccine. You may need this if you have certain conditions.  Talk to your health care provider about which screenings and vaccines you need and how often you need them. This information is not intended to replace advice given to you by your health care provider. Make sure you discuss any questions you have with your health care provider. Document Released: 08/09/2015 Document Revised: 04/01/2016 Document Reviewed: 05/14/2015 Elsevier Interactive Patient Education  2018 [employer]  

## 2018-05-31 NOTE — Assessment & Plan Note (Addendum)
Patient encouraged to maintain heart healthy diet, regular exercise, adequate sleep. Consider daily probiotics. Take medications as prescribed. Encouraged ACP documents completed and returned. Labs reviewed.

## 2018-05-31 NOTE — Assessment & Plan Note (Signed)
Encouraged heart healthy diet, increase exercise, avoid trans fats, consider a krill oil cap daily 

## 2018-05-31 NOTE — Assessment & Plan Note (Signed)
Improved with dietary changes.

## 2018-06-02 ENCOUNTER — Telehealth: Payer: Self-pay | Admitting: *Deleted

## 2018-06-02 NOTE — Telephone Encounter (Signed)
Received Medical records from [facility]; forwarded to provider/SLS 11/07

## 2018-06-07 ENCOUNTER — Ambulatory Visit (INDEPENDENT_AMBULATORY_CARE_PROVIDER_SITE_OTHER): Payer: 59 | Admitting: Physician Assistant

## 2018-06-07 ENCOUNTER — Encounter (INDEPENDENT_AMBULATORY_CARE_PROVIDER_SITE_OTHER): Payer: Self-pay | Admitting: Physician Assistant

## 2018-06-07 VITALS — BP 112/75 | HR 67 | Temp 98.4°F | Ht 67.0 in | Wt 220.0 lb

## 2018-06-07 DIAGNOSIS — E669 Obesity, unspecified: Secondary | ICD-10-CM | POA: Diagnosis not present

## 2018-06-07 DIAGNOSIS — E88819 Insulin resistance, unspecified: Secondary | ICD-10-CM

## 2018-06-07 DIAGNOSIS — E559 Vitamin D deficiency, unspecified: Secondary | ICD-10-CM

## 2018-06-07 DIAGNOSIS — Z6834 Body mass index (BMI) 34.0-34.9, adult: Secondary | ICD-10-CM

## 2018-06-07 DIAGNOSIS — E8881 Metabolic syndrome: Secondary | ICD-10-CM | POA: Diagnosis not present

## 2018-06-07 DIAGNOSIS — Z9189 Other specified personal risk factors, not elsewhere classified: Secondary | ICD-10-CM

## 2018-06-07 MED ORDER — METFORMIN HCL 500 MG PO TABS: 500.0000 mg | ORAL_TABLET | Freq: Two times a day (BID) | ORAL | 0 refills | Status: DC

## 2018-06-07 MED ORDER — VITAMIN D (ERGOCALCIFEROL) 1.25 MG (50000 UNIT) PO CAPS: 50000.0000 [IU] | ORAL_CAPSULE | ORAL | 0 refills | Status: DC

## 2018-06-07 NOTE — Progress Notes (Signed)
Office: [phone]  /  Fax: [phone]   HPI:   Chief Complaint: OBESITY Koula is here to discuss her progress with her obesity treatment plan. She is on the Category 3 plan and is following her eating plan approximately 60 % of the time. She states she is walking for 30 minutes 1 times per week. Shaylan did well with weight loss. She reports that she has gotten back on track and her cravings have decreased. She wants to talk about Thanksgiving strategies.  Her weight is 220 lb (99.8 kg) today and has had a weight loss of 2 pounds over a period of 2 weeks since her last visit. She has lost 13 lbs since starting treatment with [place].  Vitamin D Deficiency Joey has a diagnosis of vitamin D deficiency. She is currently taking prescription Vit D and denies nausea, vomiting or muscle weakness.  At risk for osteopenia and osteoporosis Lahela is at higher risk of osteopenia and osteoporosis due to vitamin D deficiency.   Insulin Resistance Parul has a diagnosis of insulin resistance based on her elevated fasting insulin level >5. Although Antanette's blood glucose readings are still under good control, insulin resistance puts her at greater risk of metabolic syndrome and diabetes. She is taking metformin currently and continues to work on diet and exercise to decrease risk of diabetes. She denies polyphagia.  ALLERGIES: Allergies  Allergen Reactions  . Penicillins Rash    Has patient had a PCN reaction causing immediate rash, facial/tongue/throat swelling, SOB or lightheadedness with hypotension: Unknown Has patient had a PCN reaction causing severe rash involving mucus membranes or skin necrosis: Unknown Has patient had a PCN reaction that required hospitalization: No Has patient had a PCN reaction occurring within the last 10 years: No Childhood allergy  If all of the above answers are "NO", then may proceed with Cephalosporin use.   . Sulfa Antibiotics Rash     MEDICATIONS: Current Outpatient Medications on File Prior to Visit  Medication Sig Dispense Refill  . b complex vitamins tablet Take 1 tablet by mouth daily.    . cetirizine (ZYRTEC) 10 MG tablet Take 10 mg by mouth daily as needed for allergies.    Marland Kitchen esomeprazole (NEXIUM) 40 MG capsule TAKE 1 CAPSULE BY MOUTH DAILY BEFORE BREAKFAST. 90 capsule 0  . Multiple Vitamin (MULTIVITAMIN) tablet Take 1 tablet by mouth daily.       No current facility-administered medications on file prior to visit.     PAST MEDICAL HISTORY: Past Medical History:  Diagnosis Date  . Acute bronchitis 06/24/2012  . Acute bronchitis with asthma 06/24/2012  . Allergic state 10/15/2013  . Anxiety   . Back pain 10/15/2013  . Chronic sinusitis 02/2012   current runny nose of clear drainage  . Constipation   . Dental crowns present   . Depression   . GERD (gastroesophageal reflux disease)   . H/O gestational diabetes mellitus, not currently pregnant 07/29/2011   h/o   . Headache    history of migraines  . History of blood transfusion   . Hyperglycemia 05/25/2017  . Hypertension   . Iron deficiency anemia   . Leg edema   . Obese   . PCOS (polycystic ovarian syndrome)   . Right shoulder pain 05/25/2017    PAST SURGICAL HISTORY: Past Surgical History:  Procedure Laterality Date  . ABDOMINAL HYSTERECTOMY  02/2017   anemia, fibroids  . COLONOSCOPY    . DILATION AND CURETTAGE OF UTERUS  1987  . NASAL  SEPTOPLASTY W/ TURBINOPLASTY  03/23/2012   Procedure: NASAL SEPTOPLASTY WITH TURBINATE REDUCTION;  Surgeon: Jerrell Belfast, MD;  Location: [facility];  Service: ENT;  Laterality: Bilateral;  . ROBOTIC ASSISTED TOTAL HYSTERECTOMY WITH SALPINGECTOMY N/A 03/17/2017   Procedure: ROBOTIC ASSISTED TOTAL HYSTERECTOMY WITH SALPINGECTOMY;  Surgeon: Bobbye Charleston, MD;  Location: [facility];  Service: Gynecology;  Laterality: N/A;  . SINUS ENDO W/FUSION  03/23/2012   Procedure: ENDOSCOPIC SINUS SURGERY WITH  FUSION NAVIGATION;  Surgeon: Jerrell Belfast, MD;  Location: [facility];  Service: ENT;  Laterality: Bilateral;  with fusion scan  . UPPER GI ENDOSCOPY    . WISDOM TOOTH EXTRACTION      SOCIAL HISTORY: Social History   Tobacco Use  . Smoking status: Former Smoker    Packs/day: 1.00    Years: 19.00    Pack years: 19.00    Last attempt to quit: 07/28/2003    Years since quitting: 14.8  . Smokeless tobacco: Never Used  Substance Use Topics  . Alcohol use: Yes    Comment: occasionally  . Drug use: No    FAMILY HISTORY: Family History  Problem Relation Age of Onset  . Diabetes Mother        type 2  . Hypertension Mother   . Depression Mother   . Mental illness Mother        bi-polar  . Pulmonary embolism Mother   . Obesity Mother   . Heart disease Maternal Grandfather   . Stroke Maternal Grandfather   . Other Father        drug addiction  . Depression Maternal Aunt 46       suicide attempt with pneumonia    ROS: Review of Systems  Constitutional: Positive for weight loss.  Gastrointestinal: Negative for nausea and vomiting.  Musculoskeletal:       Negative muscle weakness  Endo/Heme/Allergies:       Negative polyphagia    PHYSICAL EXAM: Blood pressure 112/75, pulse 67, temperature 98.4 F (36.9 C), temperature source Oral, height 5\' 7"  (1.702 m), weight 220 lb (99.8 kg), last menstrual period 03/03/2017, SpO2 98 %. Body mass index is 34.46 kg/m. Physical Exam  Constitutional: She is oriented to person, place, and time. She appears well-developed and well-nourished.  Cardiovascular: Normal rate.  Pulmonary/Chest: Effort normal.  Musculoskeletal: Normal range of motion.  Neurological: She is oriented to person, place, and time.  Skin: Skin is warm and dry.  Psychiatric: She has a normal mood and affect. Her behavior is normal.  Vitals reviewed.   RECENT LABS AND TESTS: BMET    Component Value Date/Time   NA 140 01/25/2018 1417   K 4.3  01/25/2018 1417   CL 100 01/25/2018 1417   CO2 24 01/25/2018 1417   GLUCOSE 95 01/25/2018 1417   GLUCOSE 80 05/25/2017 1442   BUN 14 01/25/2018 1417   CREATININE 0.90 01/25/2018 1417   CREATININE 0.99 03/31/2016 0952   CALCIUM 8.8 01/25/2018 1417   GFRNONAA 75 01/25/2018 1417   GFRNONAA 68 03/31/2016 0952   GFRAA 87 01/25/2018 1417   GFRAA 78 03/31/2016 0952   Lab Results  Component Value Date   HGBA1C 5.4 01/25/2018   HGBA1C 5.6 10/12/2017   HGBA1C 5.6 05/25/2017   Lab Results  Component Value Date   INSULIN 11.2 01/25/2018   INSULIN 17.0 10/12/2017   CBC    Component Value Date/Time   WBC 6.0 10/12/2017 1122   WBC 6.9 05/25/2017 1442   RBC  4.47 10/12/2017 1122   RBC 4.41 05/25/2017 1442   HGB 13.6 10/12/2017 1122   HCT 40.9 10/12/2017 1122   PLT 242.0 05/25/2017 1442   MCV 92 10/12/2017 1122   MCH 30.4 10/12/2017 1122   MCH 29.8 03/05/2017 1100   MCHC 33.3 10/12/2017 1122   MCHC 33.2 05/25/2017 1442   RDW 13.6 10/12/2017 1122   LYMPHSABS 1.9 10/12/2017 1122   EOSABS 0.5 (H) 10/12/2017 1122   BASOSABS 0.0 10/12/2017 1122   Iron/TIBC/Ferritin/ %Sat    Component Value Date/Time   IRON 116 05/25/2017 1442   TIBC 397 10/29/2011 1552   IRONPCTSAT 7 (L) 10/29/2011 1552   Lipid Panel     Component Value Date/Time   CHOL 163 01/25/2018 1417   TRIG 97 01/25/2018 1417   HDL 52 01/25/2018 1417   CHOLHDL 4 05/25/2017 1442   VLDL 17.0 05/25/2017 1442   LDLCALC 92 01/25/2018 1417   Hepatic Function Panel     Component Value Date/Time   PROT 6.8 01/25/2018 1417   ALBUMIN 4.1 01/25/2018 1417   AST 15 01/25/2018 1417   ALT 15 01/25/2018 1417   ALKPHOS 48 01/25/2018 1417   BILITOT 0.3 01/25/2018 1417   BILIDIR 0.1 10/12/2013 1440   IBILI 0.3 10/12/2013 1440      Component Value Date/Time   TSH 2.600 10/12/2017 1122   TSH 2.32 05/25/2017 1442   TSH 3.24 05/19/2016 1550  Results for ZANIYA, MCAULAY (MRN [id number]) as of 06/07/2018 15:35  Ref. Range  01/25/2018 14:17  Vitamin D, 25-Hydroxy Latest Ref Range: 30.0 - 100.0 ng/mL 46.1    ASSESSMENT AND PLAN: Vitamin D deficiency - Plan: Lipid Panel With LDL/HDL Ratio, VITAMIN D 25 Hydroxy (Vit-D Deficiency, Fractures), Vitamin D, Ergocalciferol, (DRISDOL) 1.25 MG (50000 UT) CAPS capsule  Insulin resistance - Plan: Comprehensive metabolic panel, Hemoglobin A1c, Insulin, random, Lipid Panel With LDL/HDL Ratio, metFORMIN (GLUCOPHAGE) 500 MG tablet  At risk for osteoporosis  Class 1 obesity with serious comorbidity and body mass index (BMI) of 34.0 to 34.9 in adult, unspecified obesity type  PLAN:  Vitamin D Deficiency Latanga was informed that low vitamin D levels contributes to fatigue and are associated with obesity, breast, and colon cancer. Lillian agrees to continue taking prescription Vit D @50 ,000 IU every week #4 and we will refill for 1 month. She will follow up for routine testing of vitamin D, at least 2-3 times per year. She was informed of the risk of over-replacement of vitamin D and agrees to not increase her dose unless she discusses this with [place] first. We will check labs and Meigan agrees to follow up with our clinic in 2 weeks.  At risk for osteopenia and osteoporosis Bahar was given extended (15 minutes) osteoporosis prevention counseling today. Shonya is at risk for osteopenia and osteoporsis due to her vitamin D deficiency. She was encouraged to take her vitamin D and follow her higher calcium diet and increase strengthening exercise to help strengthen her bones and decrease her risk of osteopenia and osteoporosis.  Insulin Resistance Alexsus will continue to work on weight loss, exercise, and decreasing simple carbohydrates in her diet to help decrease the risk of diabetes. We dicussed metformin including benefits and risks. She was informed that eating too many simple carbohydrates or too many calories at one sitting increases the likelihood of GI side effects.  Ellison agrees to continue taking metformin 500 mg BID #60 and we will refill for 1 month. Cleona agrees to follow up with  our clinic in 2 weeks as directed to monitor her progress.  Obesity Ellajane is currently in the action stage of change. As such, her goal is to continue with weight loss efforts She has agreed to follow the Category 3 plan Brentney has been instructed to work up to a goal of 150 minutes of combined cardio and strengthening exercise per week for weight loss and overall health benefits. We discussed the following Behavioral Modification Strategies today: work on meal planning and easy cooking plans and holiday eating strategies    Laylamarie has agreed to follow up with our clinic in 2 weeks. She was informed of the importance of frequent follow up visits to maximize her success with intensive lifestyle modifications for her multiple health conditions.   OBESITY BEHAVIORAL INTERVENTION VISIT  Today's visit was # 14   Starting weight: 233 lbs Starting date: 10/12/17 Today's weight : 220 lbs Today's date: 06/07/2018 Total lbs lost to date: 13    ASK: We discussed the diagnosis of obesity with [name] today and Kaydense agreed to give [place] permission to discuss obesity behavioral modification therapy today.  ASSESS: Shante has the diagnosis of obesity and her BMI today is 34.45 Gabriel is in the action stage of change   ADVISE: Mairyn was educated on the multiple health risks of obesity as well as the benefit of weight loss to improve her health. She was advised of the need for long term treatment and the importance of lifestyle modifications.  AGREE: Multiple dietary modification options and treatment options were discussed and  Skarlette agreed to the above obesity treatment plan.  Wilhemena Durie, am acting as transcriptionist for Abby Potash, PA-C I, Abby Potash, PA-C have reviewed above note and agree with its content

## 2018-06-08 LAB — COMPREHENSIVE METABOLIC PANEL
A/G RATIO: 1.3 (ref 1.2–2.2)
ALBUMIN: 4.4 g/dL (ref 3.5–5.5)
ALT: 11 IU/L (ref 0–32)
AST: 16 IU/L (ref 0–40)
Alkaline Phosphatase: 55 IU/L (ref 39–117)
BILIRUBIN TOTAL: 0.5 mg/dL (ref 0.0–1.2)
BUN / CREAT RATIO: 16 (ref 9–23)
BUN: 15 mg/dL (ref 6–24)
CHLORIDE: 101 mmol/L (ref 96–106)
CO2: 22 mmol/L (ref 20–29)
Calcium: 9.3 mg/dL (ref 8.7–10.2)
Creatinine, Ser: 0.92 mg/dL (ref 0.57–1.00)
GFR calc non Af Amer: 73 mL/min/{1.73_m2} (ref >59)
GFR, EST AFRICAN AMERICAN: 85 mL/min/{1.73_m2} (ref >59)
GLUCOSE: 83 mg/dL (ref 65–99)
Globulin, Total: 3.3 g/dL (ref 1.5–4.5)
POTASSIUM: 4.5 mmol/L (ref 3.5–5.2)
SODIUM: 139 mmol/L (ref 134–144)
TOTAL PROTEIN: 7.7 g/dL (ref 6.0–8.5)

## 2018-06-08 LAB — INSULIN, RANDOM: INSULIN: 16 u[IU]/mL (ref 2.6–24.9)

## 2018-06-08 LAB — LIPID PANEL WITH LDL/HDL RATIO
CHOLESTEROL TOTAL: 190 mg/dL (ref 100–199)
HDL: 56 mg/dL (ref >39)
LDL CALC: 118 mg/dL — AB (ref 0–99)
LDL/HDL RATIO: 2.1 ratio (ref 0.0–3.2)
Triglycerides: 80 mg/dL (ref 0–149)
VLDL CHOLESTEROL CAL: 16 mg/dL (ref 5–40)

## 2018-06-08 LAB — HEMOGLOBIN A1C
Est. average glucose Bld gHb Est-mCnc: 105 mg/dL
HEMOGLOBIN A1C: 5.3 % (ref 4.8–5.6)

## 2018-06-08 LAB — VITAMIN D 25 HYDROXY (VIT D DEFICIENCY, FRACTURES): VIT D 25 HYDROXY: 63.3 ng/mL (ref 30.0–100.0)

## 2018-06-13 ENCOUNTER — Encounter: Payer: Self-pay | Admitting: Medical

## 2018-06-13 ENCOUNTER — Telehealth: Payer: Self-pay | Admitting: Medical

## 2018-06-13 ENCOUNTER — Ambulatory Visit (HOSPITAL_BASED_OUTPATIENT_CLINIC_OR_DEPARTMENT_OTHER)
Admission: RE | Admit: 2018-06-13 | Discharge: 2018-06-13 | Disposition: A | Discharge status: Home / Self Care | Payer: 59 | Source: Ambulatory Visit | Attending: Medical | Admitting: Medical

## 2018-06-13 ENCOUNTER — Ambulatory Visit: Payer: 59 | Admitting: Medical

## 2018-06-13 VITALS — BP 123/77 | HR 59 | Temp 98.2°F | Resp 16 | Ht 67.0 in | Wt 227.8 lb

## 2018-06-13 DIAGNOSIS — M25552 Pain in left hip: Secondary | ICD-10-CM | POA: Diagnosis not present

## 2018-06-13 DIAGNOSIS — M25562 Pain in left knee: Secondary | ICD-10-CM | POA: Diagnosis not present

## 2018-06-13 MED ORDER — DICLOFENAC SODIUM 75 MG PO TBEC: 75.0000 mg | DELAYED_RELEASE_TABLET | Freq: Two times a day (BID) | ORAL | 0 refills | Status: DC

## 2018-06-13 MED ORDER — TRAMADOL HCL 50 MG PO TABS: 50.0000 mg | ORAL_TABLET | Freq: Four times a day (QID) | ORAL | 0 refills | Status: DC | PRN

## 2018-06-13 NOTE — Progress Notes (Signed)
Subjective:    Patient ID: Connie Blanchard, female    DOB: [birth year], 47 y.o.   MRN: [id number]  HPI  Pt in with left knee. Pain for one month. Pt states she stood at concern for a long time and was standing in awkward manner. Since then her knee has been hurting. It is not getting better. Recent worse since this weekend.  Pt just got back from the [place].   A lot of walking on that trip.  LMP- one year ago.  Pain is 8/10 just recently.  Review of Systems  Constitutional: Negative for chills, fatigue and fever.  Respiratory: Negative for chest tightness, shortness of breath and wheezing.   Cardiovascular: Negative for chest pain and palpitations.  Gastrointestinal: Negative for abdominal pain.  Genitourinary: Negative for dysuria and flank pain.  Musculoskeletal:       Left knee pain.  With some hip pain.  Hematological: Negative for adenopathy. Does not bruise/bleed easily.  Psychiatric/Behavioral: Negative for behavioral problems and confusion.    Past Medical History:  Diagnosis Date  . Acute bronchitis 06/24/2012  . Acute bronchitis with asthma 06/24/2012  . Allergic state 10/15/2013  . Anxiety   . Back pain 10/15/2013  . Chronic sinusitis 02/2012   current runny nose of clear drainage  . Constipation   . Dental crowns present   . Depression   . GERD (gastroesophageal reflux disease)   . H/O gestational diabetes mellitus, not currently pregnant 07/29/2011   h/o   . Headache    history of migraines  . History of blood transfusion   . Hyperglycemia 05/25/2017  . Hypertension   . Iron deficiency anemia   . Leg edema   . Obese   . PCOS (polycystic ovarian syndrome)   . Right shoulder pain 05/25/2017     Social History   Socioeconomic History  . Marital status: Married    Spouse name: Dellis Filbert  . Number of children: 4  . Years of education: Not on file  . Highest education level: Not on file  Occupational History  . Occupation: Therapist, sports  Social Needs  .  Financial resource strain: Not on file  . Food insecurity:    Worry: Not on file    Inability: Not on file  . Transportation needs:    Medical: Not on file    Non-medical: Not on file  Tobacco Use  . Smoking status: Former Smoker    Packs/day: 1.00    Years: 19.00    Pack years: 19.00    Last attempt to quit: 07/28/2003    Years since quitting: 14.8  . Smokeless tobacco: Never Used  Substance and Sexual Activity  . Alcohol use: Yes    Comment: occasionally  . Drug use: No  . Sexual activity: Yes    Partners: Male    Birth control/protection: None    Comment: lives with husband, stepson 54, son and daughter. no dietary restrictions. eating heart healthy, walking more  Lifestyle  . Physical activity:    Days per week: Not on file    Minutes per session: Not on file  . Stress: Not on file  Relationships  . Social connections:    Talks on phone: Not on file    Gets together: Not on file    Attends religious service: Not on file    Active member of club or organization: Not on file    Attends meetings of clubs or organizations: Not on file    Relationship  status: Not on file  . Intimate partner violence:    Fear of current or ex partner: Not on file    Emotionally abused: Not on file    Physically abused: Not on file    Forced sexual activity: Not on file  Other Topics Concern  . Not on file  Social History Narrative  . Not on file    Past Surgical History:  Procedure Laterality Date  . ABDOMINAL HYSTERECTOMY  02/2017   anemia, fibroids  . COLONOSCOPY    . DILATION AND CURETTAGE OF UTERUS  1987  . NASAL SEPTOPLASTY W/ TURBINOPLASTY  03/23/2012   Procedure: NASAL SEPTOPLASTY WITH TURBINATE REDUCTION;  Surgeon: Jerrell Belfast, MD;  Location: [facility];  Service: ENT;  Laterality: Bilateral;  . ROBOTIC ASSISTED TOTAL HYSTERECTOMY WITH SALPINGECTOMY N/A 03/17/2017   Procedure: ROBOTIC ASSISTED TOTAL HYSTERECTOMY WITH SALPINGECTOMY;  Surgeon: Bobbye Charleston, MD;  Location: [facility];  Service: Gynecology;  Laterality: N/A;  . SINUS ENDO W/FUSION  03/23/2012   Procedure: ENDOSCOPIC SINUS SURGERY WITH FUSION NAVIGATION;  Surgeon: Jerrell Belfast, MD;  Location: [facility];  Service: ENT;  Laterality: Bilateral;  with fusion scan  . UPPER GI ENDOSCOPY    . WISDOM TOOTH EXTRACTION      Family History  Problem Relation Age of Onset  . Diabetes Mother        type 2  . Hypertension Mother   . Depression Mother   . Mental illness Mother        bi-polar  . Pulmonary embolism Mother   . Obesity Mother   . Heart disease Maternal Grandfather   . Stroke Maternal Grandfather   . Other Father        drug addiction  . Depression Maternal Aunt 32       suicide attempt with pneumonia    Allergies  Allergen Reactions  . Penicillins Rash    Has patient had a PCN reaction causing immediate rash, facial/tongue/throat swelling, SOB or lightheadedness with hypotension: Unknown Has patient had a PCN reaction causing severe rash involving mucus membranes or skin necrosis: Unknown Has patient had a PCN reaction that required hospitalization: No Has patient had a PCN reaction occurring within the last 10 years: No Childhood allergy  If all of the above answers are "NO", then may proceed with Cephalosporin use.   . Sulfa Antibiotics Rash    Current Outpatient Medications on File Prior to Visit  Medication Sig Dispense Refill  . b complex vitamins tablet Take 1 tablet by mouth daily.    . cetirizine (ZYRTEC) 10 MG tablet Take 10 mg by mouth daily as needed for allergies.    Marland Kitchen esomeprazole (NEXIUM) 40 MG capsule TAKE 1 CAPSULE BY MOUTH DAILY BEFORE BREAKFAST. 90 capsule 0  . metFORMIN (GLUCOPHAGE) 500 MG tablet Take 1 tablet (500 mg total) by mouth 2 (two) times daily with a meal. 60 tablet 0  . Multiple Vitamin (MULTIVITAMIN) tablet Take 1 tablet by mouth daily.      . Vitamin D, Ergocalciferol, (DRISDOL) 1.25 MG (50000 UT) CAPS  capsule Take 1 capsule (50,000 Units total) by mouth every 7 (seven) days. 4 capsule 0   No current facility-administered medications on file prior to visit.     BP 123/77   Pulse (!) 59   Temp 98.2 F (36.8 C) (Oral)   Resp 16   Ht 5\' 7"  (1.702 m)   Wt 227 lb 12.8 oz (103.3 kg)   LMP 03/03/2017 (  Exact Date)   SpO2 100%   BMI 35.68 kg/m       Objective:   Physical Exam  General- No acute distress. Pleasant patient. Neck- Full range of motion, no jvd Lungs- Clear, even and unlabored. Heart- regular rate and rhythm. Neurologic- CNII- XII grossly intact.  Left knee- medial tibial plateau area tenderness on palpation. Some crepitus on range of motion. Lower ext- both calfs are symmetric. Negative homans signs on either side.(no pedal edema).  Left hip- faint tenderness to palpation. But no pain on range of motion.      Assessment & Plan:  For your recent left knee and hip pain, will get x-rays of both areas.  With x-rays will assess the joint spaces.  The location of your knee pain and characteristics causes some concern for possible meniscus injury.  Will prescribe diclofenac NSAID but also advised no NSAIDs over-the-counter.  Providing limited number of tramadol for severe pain.  After x-rays are back likely will refer to either sports medicine or orthopedist.  Would recommend getting a knee brace over-the-counter to support knee.  Follow-up date to be determined after imaging review  Did counsel pt in light of her recent trip to [place] if any pain behind knee or any swelling of calf notify me and in that event would get lower ext [place] stat. But not indicated today.Pt expressed understanding.  Mackie Pai, PA-C

## 2018-06-13 NOTE — Telephone Encounter (Signed)
Referral to sports med placed.

## 2018-06-13 NOTE — Patient Instructions (Signed)
For your recent left knee and hip pain, will get x-rays of both areas.  With x-rays will assess the joint spaces.  The location of your knee pain and characteristics causes some concern for possible meniscus injury.  Will prescribe diclofenac NSAID but also advised no NSAIDs over-the-counter.  Providing limited number of tramadol for severe pain.  After x-rays are back likely will refer to either sports medicine or orthopedist.  Would recommend getting a knee brace over-the-counter to support knee.  Follow-up date to be determined after imaging review.

## 2018-06-14 ENCOUNTER — Ambulatory Visit: Payer: 59 | Admitting: Family Medicine

## 2018-06-16 ENCOUNTER — Encounter: Payer: Self-pay | Admitting: Family Medicine

## 2018-06-16 ENCOUNTER — Ambulatory Visit: Payer: 59 | Admitting: Family Medicine

## 2018-06-16 VITALS — BP 114/80 | HR 67 | Ht 67.0 in | Wt 222.0 lb

## 2018-06-16 DIAGNOSIS — M25562 Pain in left knee: Secondary | ICD-10-CM

## 2018-06-16 DIAGNOSIS — M79605 Pain in left leg: Secondary | ICD-10-CM

## 2018-06-16 NOTE — Patient Instructions (Signed)
Your exam is reassuring. 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 Ice over area of pain 3-4 times a day for 15 minutes at a time as needed. As tolerated you can wean down from the diclofenac to once a day with food then eventually just as needed. Standing hip rotations, Hip side raise exercise 3 sets of 10 once a day - add weights if these become too easy. Stretches - pick 2-3 and hold for 20-30 seconds x 3 - do once or twice a day. If not improving, can consider physical therapy and/or steroid injection. Follow up with me as needed.  Your knee pain is due to pinching of the synovium, medial meniscus but you do not have a tear or anything concerning on exam. Continue diclofenac as noted above, wean off as tolerated. Icing only if needed 15 minutes at a time 3-4 times a day.

## 2018-06-18 ENCOUNTER — Encounter: Payer: Self-pay | Admitting: Family Medicine

## 2018-06-18 NOTE — Progress Notes (Signed)
PCP: Mosie Lukes, MD Consultation requested by: Mackie Pai PA-C  Subjective:   HPI: Patient is a 47 y.o. female here for left leg pain.  Patient reports for about 1 month she's had left knee pain that's more medial. Started when she was at a concert in mid-October leaning to the left for a prolonged period away from another person. Caused her to have pain at this medial left knee but also past couple weeks had pain lateral left hip. Diclofenac has helped a lot. Pain level in both areas a 2/10 more of a soreness now. No skin changes, numbness  Past Medical History:  Diagnosis Date  . Acute bronchitis 06/24/2012  . Acute bronchitis with asthma 06/24/2012  . Allergic state 10/15/2013  . Anxiety   . Back pain 10/15/2013  . Chronic sinusitis 02/2012   current runny nose of clear drainage  . Constipation   . Dental crowns present   . Depression   . GERD (gastroesophageal reflux disease)   . H/O gestational diabetes mellitus, not currently pregnant 07/29/2011   h/o   . Headache    history of migraines  . History of blood transfusion   . Hyperglycemia 05/25/2017  . Hypertension   . Iron deficiency anemia   . Leg edema   . Obese   . PCOS (polycystic ovarian syndrome)   . Right shoulder pain 05/25/2017    Current Outpatient Medications on File Prior to Visit  Medication Sig Dispense Refill  . b complex vitamins tablet Take 1 tablet by mouth daily.    . cetirizine (ZYRTEC) 10 MG tablet Take 10 mg by mouth daily as needed for allergies.    Marland Kitchen diclofenac (VOLTAREN) 75 MG EC tablet Take 1 tablet (75 mg total) by mouth 2 (two) times daily. 30 tablet 0  . esomeprazole (NEXIUM) 40 MG capsule TAKE 1 CAPSULE BY MOUTH DAILY BEFORE BREAKFAST. 90 capsule 0  . metFORMIN (GLUCOPHAGE) 500 MG tablet Take 1 tablet (500 mg total) by mouth 2 (two) times daily with a meal. 60 tablet 0  . Multiple Vitamin (MULTIVITAMIN) tablet Take 1 tablet by mouth daily.      . traMADol (ULTRAM) 50 MG tablet  Take 1 tablet (50 mg total) by mouth every 6 (six) hours as needed for severe pain. 6 tablet 0  . Vitamin D, Ergocalciferol, (DRISDOL) 1.25 MG (50000 UT) CAPS capsule Take 1 capsule (50,000 Units total) by mouth every 7 (seven) days. 4 capsule 0   No current facility-administered medications on file prior to visit.     Past Surgical History:  Procedure Laterality Date  . ABDOMINAL HYSTERECTOMY  02/2017   anemia, fibroids  . COLONOSCOPY    . DILATION AND CURETTAGE OF UTERUS  1987  . NASAL SEPTOPLASTY W/ TURBINOPLASTY  03/23/2012   Procedure: NASAL SEPTOPLASTY WITH TURBINATE REDUCTION;  Surgeon: Jerrell Belfast, MD;  Location: [facility];  Service: ENT;  Laterality: Bilateral;  . ROBOTIC ASSISTED TOTAL HYSTERECTOMY WITH SALPINGECTOMY N/A 03/17/2017   Procedure: ROBOTIC ASSISTED TOTAL HYSTERECTOMY WITH SALPINGECTOMY;  Surgeon: Bobbye Charleston, MD;  Location: [facility];  Service: Gynecology;  Laterality: N/A;  . SINUS ENDO W/FUSION  03/23/2012   Procedure: ENDOSCOPIC SINUS SURGERY WITH FUSION NAVIGATION;  Surgeon: Jerrell Belfast, MD;  Location: [facility];  Service: ENT;  Laterality: Bilateral;  with fusion scan  . UPPER GI ENDOSCOPY    . WISDOM TOOTH EXTRACTION      Allergies  Allergen Reactions  . Penicillins Rash  Has patient had a PCN reaction causing immediate rash, facial/tongue/throat swelling, SOB or lightheadedness with hypotension: Unknown Has patient had a PCN reaction causing severe rash involving mucus membranes or skin necrosis: Unknown Has patient had a PCN reaction that required hospitalization: No Has patient had a PCN reaction occurring within the last 10 years: No Childhood allergy  If all of the above answers are "NO", then may proceed with Cephalosporin use.   . Sulfa Antibiotics Rash    Social History   Socioeconomic History  . Marital status: Married    Spouse name: Dellis Filbert  . Number of children: 4  . Years of education: Not on  file  . Highest education level: Not on file  Occupational History  . Occupation: Therapist, sports  Social Needs  . Financial resource strain: Not on file  . Food insecurity:    Worry: Not on file    Inability: Not on file  . Transportation needs:    Medical: Not on file    Non-medical: Not on file  Tobacco Use  . Smoking status: Former Smoker    Packs/day: 1.00    Years: 19.00    Pack years: 19.00    Last attempt to quit: 07/28/2003    Years since quitting: 14.9  . Smokeless tobacco: Never Used  Substance and Sexual Activity  . Alcohol use: Yes    Comment: occasionally  . Drug use: No  . Sexual activity: Yes    Partners: Male    Birth control/protection: None    Comment: lives with husband, stepson 54, son and daughter. no dietary restrictions. eating heart healthy, walking more  Lifestyle  . Physical activity:    Days per week: Not on file    Minutes per session: Not on file  . Stress: Not on file  Relationships  . Social connections:    Talks on phone: Not on file    Gets together: Not on file    Attends religious service: Not on file    Active member of club or organization: Not on file    Attends meetings of clubs or organizations: Not on file    Relationship status: Not on file  . Intimate partner violence:    Fear of current or ex partner: Not on file    Emotionally abused: Not on file    Physically abused: Not on file    Forced sexual activity: Not on file  Other Topics Concern  . Not on file  Social History Narrative  . Not on file    Family History  Problem Relation Age of Onset  . Diabetes Mother        type 2  . Hypertension Mother   . Depression Mother   . Mental illness Mother        bi-polar  . Pulmonary embolism Mother   . Obesity Mother   . Heart disease Maternal Grandfather   . Stroke Maternal Grandfather   . Other Father        drug addiction  . Depression Maternal Aunt 96       suicide attempt with pneumonia    BP 114/80   Pulse 67   Ht 5'  7" (1.702 m)   Wt 222 lb (100.7 kg)   LMP 03/03/2017 (Exact Date)   BMI 34.77 kg/m   Review of Systems: See HPI above.     Objective:  Physical Exam:  Gen: NAD, comfortable in exam room  Left knee: No gross deformity, ecchymoses, swelling. TTP medial  joint line.  No other tenderness. FROM with 5/5 strength flexion and extension. Negative ant/post drawers. Negative valgus/varus testing. Negative lachmans. Negative mcmurrays, apleys, patellar apprehension. NV intact distally.  Right knee: No deformity. FROM with 5/5 strength. No tenderness to palpation. NVI distally.  Left hip: No deformity. FROM with 5/5 strength except 4/5 hip abduction with pain. TTP proximal IT band.  No other tenderness. NVI distally. Negative logroll.   Assessment & Plan:  1. Left knee pain - exam reassuring.  Mechanism, exam consistent with meniscus contusion or synovitis.  Diclofenac, icing.  2. Left hip pain - 2/2 IT band syndrome.  Shown home exercises and stretches to do daily.  Icing, diclofenac.  Consider therapy if not improving.  F/u prn.

## 2018-06-20 ENCOUNTER — Other Ambulatory Visit (INDEPENDENT_AMBULATORY_CARE_PROVIDER_SITE_OTHER): Payer: Self-pay | Admitting: Family Medicine

## 2018-06-20 DIAGNOSIS — E559 Vitamin D deficiency, unspecified: Secondary | ICD-10-CM

## 2018-06-21 ENCOUNTER — Ambulatory Visit (INDEPENDENT_AMBULATORY_CARE_PROVIDER_SITE_OTHER): Payer: 59 | Admitting: Physician Assistant

## 2018-06-21 ENCOUNTER — Encounter (INDEPENDENT_AMBULATORY_CARE_PROVIDER_SITE_OTHER): Payer: Self-pay

## 2018-06-22 ENCOUNTER — Encounter (INDEPENDENT_AMBULATORY_CARE_PROVIDER_SITE_OTHER): Payer: Self-pay | Admitting: Bariatrics

## 2018-06-22 ENCOUNTER — Ambulatory Visit (INDEPENDENT_AMBULATORY_CARE_PROVIDER_SITE_OTHER): Payer: 59 | Admitting: Bariatrics

## 2018-06-22 VITALS — BP 121/76 | HR 66 | Temp 98.2°F | Ht 67.0 in | Wt 220.0 lb

## 2018-06-22 DIAGNOSIS — F3289 Other specified depressive episodes: Secondary | ICD-10-CM | POA: Diagnosis not present

## 2018-06-22 DIAGNOSIS — Z9189 Other specified personal risk factors, not elsewhere classified: Secondary | ICD-10-CM | POA: Diagnosis not present

## 2018-06-22 DIAGNOSIS — E88819 Insulin resistance, unspecified: Secondary | ICD-10-CM

## 2018-06-22 DIAGNOSIS — Z6834 Body mass index (BMI) 34.0-34.9, adult: Secondary | ICD-10-CM

## 2018-06-22 DIAGNOSIS — E559 Vitamin D deficiency, unspecified: Secondary | ICD-10-CM | POA: Diagnosis not present

## 2018-06-22 DIAGNOSIS — E8881 Metabolic syndrome: Secondary | ICD-10-CM

## 2018-06-22 DIAGNOSIS — E669 Obesity, unspecified: Secondary | ICD-10-CM

## 2018-06-22 MED ORDER — BUPROPION HCL ER (SR) 150 MG PO TB12: 150.0000 mg | ORAL_TABLET | Freq: Every day | ORAL | 0 refills | Status: DC

## 2018-06-22 MED ORDER — METFORMIN HCL 500 MG PO TABS: 500.0000 mg | ORAL_TABLET | Freq: Two times a day (BID) | ORAL | 0 refills | Status: DC

## 2018-06-29 DIAGNOSIS — E8881 Metabolic syndrome: Secondary | ICD-10-CM

## 2018-06-29 DIAGNOSIS — E88819 Insulin resistance, unspecified: Secondary | ICD-10-CM

## 2018-06-29 HISTORY — DX: Metabolic syndrome: E88.81

## 2018-06-29 HISTORY — DX: Insulin resistance, unspecified: E88.819

## 2018-06-29 NOTE — Progress Notes (Signed)
Office: [phone]  /  Fax: [phone]   HPI:   Chief Complaint: OBESITY Connie Blanchard is here to discuss her progress with her obesity treatment plan. She is on the Category 3 plan and is following her eating plan approximately 75 % of the time. She states she is walking for 30 minutes 1 time per week. Labria struggled with the diet at home. She struggles at night with cravings.  Her weight is 220 lb (99.8 kg) today and has gained 1 pound since her last visit. She has lost 13 lbs since starting treatment with [place].  Vitamin D Deficiency Connie Blanchard has a diagnosis of vitamin D deficiency. She is currently taking OTC Vit D and denies nausea, vomiting or muscle weakness.  At risk for osteopenia and osteoporosis Connie Blanchard is at higher risk of osteopenia and osteoporosis due to vitamin D deficiency.   Insulin Resistance Connie Blanchard has a diagnosis of insulin resistance based on her elevated fasting insulin level >5. Although Connie Blanchard's blood glucose readings are still under good control, insulin resistance puts her at greater risk of metabolic syndrome and diabetes. She is taking metformin currently and continues to work on diet and exercise to decrease risk of diabetes. She notes polyphagia is controlled.  Depression with emotional eating behaviors Connie Blanchard notes no absolute or relative contraindications. Connie Blanchard struggles with emotional eating and using food for comfort to the extent that it is negatively impacting her health. She often snacks when she is not hungry. Connie Blanchard sometimes feels she is out of control and then feels guilty that she made poor food choices. She has been working on behavior modification techniques to help reduce her emotional eating and has been somewhat successful. She shows no sign of suicidal or homicidal ideations.  Depression screen Connie Blanchard 2/9 10/12/2017 05/25/2017  Decreased Interest 3 0  Down, Depressed, Hopeless 3 -  PHQ - 2 Score 6 0  Altered sleeping 3 -  Tired,  decreased energy 3 -  Change in appetite 2 -  Feeling bad or failure about yourself  3 -  Trouble concentrating 2 -  Moving slowly or fidgety/restless 2 -  Suicidal thoughts 0 -  PHQ-9 Score 21 -  Difficult doing work/chores Somewhat difficult -    ALLERGIES: Allergies  Allergen Reactions  . Penicillins Rash    Has patient had a PCN reaction causing immediate rash, facial/tongue/throat swelling, SOB or lightheadedness with hypotension: Unknown Has patient had a PCN reaction causing severe rash involving mucus membranes or skin necrosis: Unknown Has patient had a PCN reaction that required hospitalization: No Has patient had a PCN reaction occurring within the last 10 years: No Childhood allergy  If all of the above answers are "NO", then may proceed with Cephalosporin use.   . Sulfa Antibiotics Rash    MEDICATIONS: Current Outpatient Medications on File Prior to Visit  Medication Sig Dispense Refill  . b complex vitamins tablet Take 1 tablet by mouth daily.    . cetirizine (ZYRTEC) 10 MG tablet Take 10 mg by mouth daily as needed for allergies.    Marland Kitchen diclofenac (VOLTAREN) 75 MG EC tablet Take 1 tablet (75 mg total) by mouth 2 (two) times daily. 30 tablet 0  . esomeprazole (NEXIUM) 40 MG capsule TAKE 1 CAPSULE BY MOUTH DAILY BEFORE BREAKFAST. 90 capsule 0  . Multiple Vitamin (MULTIVITAMIN) tablet Take 1 tablet by mouth daily.      . traMADol (ULTRAM) 50 MG tablet Take 1 tablet (50 mg total) by mouth every 6 (six) hours  as needed for severe pain. 6 tablet 0  . Vitamin D, Ergocalciferol, (DRISDOL) 1.25 MG (50000 UT) CAPS capsule Take 1 capsule (50,000 Units total) by mouth every 7 (seven) days. 4 capsule 0   No current facility-administered medications on file prior to visit.     PAST MEDICAL HISTORY: Past Medical History:  Diagnosis Date  . Acute bronchitis 06/24/2012  . Acute bronchitis with asthma 06/24/2012  . Allergic state 10/15/2013  . Anxiety   . Back pain 10/15/2013    . Chronic sinusitis 02/2012   current runny nose of clear drainage  . Constipation   . Dental crowns present   . Depression   . GERD (gastroesophageal reflux disease)   . H/O gestational diabetes mellitus, not currently pregnant 07/29/2011   h/o   . Headache    history of migraines  . History of blood transfusion   . Hyperglycemia 05/25/2017  . Hypertension   . Iron deficiency anemia   . Leg edema   . Obese   . PCOS (polycystic ovarian syndrome)   . Right shoulder pain 05/25/2017    PAST SURGICAL HISTORY: Past Surgical History:  Procedure Laterality Date  . ABDOMINAL HYSTERECTOMY  02/2017   anemia, fibroids  . COLONOSCOPY    . DILATION AND CURETTAGE OF UTERUS  1987  . NASAL SEPTOPLASTY W/ TURBINOPLASTY  03/23/2012   Procedure: NASAL SEPTOPLASTY WITH TURBINATE REDUCTION;  Surgeon: Jerrell Belfast, MD;  Location: [facility];  Service: ENT;  Laterality: Bilateral;  . ROBOTIC ASSISTED TOTAL HYSTERECTOMY WITH SALPINGECTOMY N/A 03/17/2017   Procedure: ROBOTIC ASSISTED TOTAL HYSTERECTOMY WITH SALPINGECTOMY;  Surgeon: Bobbye Charleston, MD;  Location: [facility];  Service: Gynecology;  Laterality: N/A;  . SINUS ENDO W/FUSION  03/23/2012   Procedure: ENDOSCOPIC SINUS SURGERY WITH FUSION NAVIGATION;  Surgeon: Jerrell Belfast, MD;  Location: [facility];  Service: ENT;  Laterality: Bilateral;  with fusion scan  . UPPER GI ENDOSCOPY    . WISDOM TOOTH EXTRACTION      SOCIAL HISTORY: Social History   Tobacco Use  . Smoking status: Former Smoker    Packs/day: 1.00    Years: 19.00    Pack years: 19.00    Last attempt to quit: 07/28/2003    Years since quitting: 14.9  . Smokeless tobacco: Never Used  Substance Use Topics  . Alcohol use: Yes    Comment: occasionally  . Drug use: No    FAMILY HISTORY: Family History  Problem Relation Age of Onset  . Diabetes Mother        type 2  . Hypertension Mother   . Depression Mother   . Mental illness Mother         bi-polar  . Pulmonary embolism Mother   . Obesity Mother   . Heart disease Maternal Grandfather   . Stroke Maternal Grandfather   . Other Father        drug addiction  . Depression Maternal Aunt 46       suicide attempt with pneumonia    ROS: Review of Systems  Constitutional: Negative for weight loss.  Gastrointestinal: Negative for nausea and vomiting.  Musculoskeletal:       Negative muscle weakness  Endo/Heme/Allergies:       Negative polyphagia  Psychiatric/Behavioral: Positive for depression. Negative for suicidal ideas.    PHYSICAL EXAM: Blood pressure 121/76, pulse 66, temperature 98.2 F (36.8 C), temperature source Oral, height 5\' 7"  (1.702 m), weight 220 lb (99.8 kg), last menstrual period 03/03/2017,  SpO2 96 %. Body mass index is 34.46 kg/m. Physical Exam  Constitutional: She is oriented to person, place, and time. She appears well-developed and well-nourished.  Cardiovascular: Normal rate.  Pulmonary/Chest: Effort normal.  Musculoskeletal: Normal range of motion.  Neurological: She is oriented to person, place, and time.  Skin: Skin is warm and dry.  Psychiatric: She has a normal mood and affect. Her behavior is normal.  Vitals reviewed.   RECENT LABS AND TESTS: BMET    Component Value Date/Time   NA 139 06/07/2018 0957   K 4.5 06/07/2018 0957   CL 101 06/07/2018 0957   CO2 22 06/07/2018 0957   GLUCOSE 83 06/07/2018 0957   GLUCOSE 80 05/25/2017 1442   BUN 15 06/07/2018 0957   CREATININE 0.92 06/07/2018 0957   CREATININE 0.99 03/31/2016 0952   CALCIUM 9.3 06/07/2018 0957   GFRNONAA 73 06/07/2018 0957   GFRNONAA 68 03/31/2016 0952   GFRAA 85 06/07/2018 0957   GFRAA 78 03/31/2016 0952   Lab Results  Component Value Date   HGBA1C 5.3 06/07/2018   HGBA1C 5.4 01/25/2018   HGBA1C 5.6 10/12/2017   HGBA1C 5.6 05/25/2017   Lab Results  Component Value Date   INSULIN 16.0 06/07/2018   INSULIN 11.2 01/25/2018   INSULIN 17.0 10/12/2017   CBC      Component Value Date/Time   WBC 6.0 10/12/2017 1122   WBC 6.9 05/25/2017 1442   RBC 4.47 10/12/2017 1122   RBC 4.41 05/25/2017 1442   HGB 13.6 10/12/2017 1122   HCT 40.9 10/12/2017 1122   PLT 242.0 05/25/2017 1442   MCV 92 10/12/2017 1122   MCH 30.4 10/12/2017 1122   MCH 29.8 03/05/2017 1100   MCHC 33.3 10/12/2017 1122   MCHC 33.2 05/25/2017 1442   RDW 13.6 10/12/2017 1122   LYMPHSABS 1.9 10/12/2017 1122   EOSABS 0.5 (H) 10/12/2017 1122   BASOSABS 0.0 10/12/2017 1122   Iron/TIBC/Ferritin/ %Sat    Component Value Date/Time   IRON 116 05/25/2017 1442   TIBC 397 10/29/2011 1552   IRONPCTSAT 7 (L) 10/29/2011 1552   Lipid Panel     Component Value Date/Time   CHOL 190 06/07/2018 0957   TRIG 80 06/07/2018 0957   HDL 56 06/07/2018 0957   CHOLHDL 4 05/25/2017 1442   VLDL 17.0 05/25/2017 1442   LDLCALC 118 (H) 06/07/2018 0957   Hepatic Function Panel     Component Value Date/Time   PROT 7.7 06/07/2018 0957   ALBUMIN 4.4 06/07/2018 0957   AST 16 06/07/2018 0957   ALT 11 06/07/2018 0957   ALKPHOS 55 06/07/2018 0957   BILITOT 0.5 06/07/2018 0957   BILIDIR 0.1 10/12/2013 1440   IBILI 0.3 10/12/2013 1440      Component Value Date/Time   TSH 2.600 10/12/2017 1122   TSH 2.32 05/25/2017 1442   TSH 3.24 05/19/2016 1550  Results for ZYONA, PETTAWAY (MRN [id number]) as of 06/29/2018 09:55  Ref. Range 06/07/2018 09:57  Vitamin D, 25-Hydroxy Latest Ref Range: 30.0 - 100.0 ng/mL 63.3    ASSESSMENT AND PLAN: Vitamin D deficiency  Insulin resistance - Plan: metFORMIN (GLUCOPHAGE) 500 MG tablet  Other depression - with emotional eating  - Plan: buPROPion (WELLBUTRIN SR) 150 MG 12 hr tablet  At risk for osteoporosis  Class 1 obesity with serious comorbidity and body mass index (BMI) of 34.0 to 34.9 in adult, unspecified obesity type  PLAN:  Vitamin D Deficiency Connie Blanchard was informed that low vitamin D levels contributes to  fatigue and are associated with obesity,  breast, and colon cancer. Riham agrees to continue taking OTC Vit D 2,000 IU daily and will follow up for routine testing of vitamin D, at least 2-3 times per year. She was informed of the risk of over-replacement of vitamin D and agrees to not increase her dose unless she discusses this with [place] first. Connie Blanchard agrees to follow up with our clinic in 2 weeks with Connie Potash, Connie Blanchard.  At risk for osteopenia and osteoporosis Connie Blanchard was given extended (15 minutes) osteoporosis prevention counseling today. Connie Blanchard is at risk for osteopenia and osteoporsis due to her vitamin D deficiency. She was encouraged to take her vitamin D and follow her higher calcium diet and increase strengthening exercise to help strengthen her bones and decrease her risk of osteopenia and osteoporosis.  Insulin Resistance Connie Blanchard will continue to work on weight loss, exercise, and decreasing simple carbohydrates in her diet to help decrease the risk of diabetes. We dicussed metformin including benefits and risks. She was informed that eating too many simple carbohydrates or too many calories at one sitting increases the likelihood of GI side effects. Connie Blanchard agrees to continue taking metformin 500 mg BID #60 and we will refill for 1 month. Connie Blanchard agrees to follow up with our clinic 2 weeks with Connie Potash, Connie Blanchard as directed to monitor her progress.  Depression with Emotional Eating Behaviors We discussed behavior modification techniques today to help Connie Blanchard deal with her emotional eating and depression. Connie Blanchard agrees to start Wellbutrin SR 150 mg 1 tablet daily #30 with no refills. Connie Blanchard agrees to follow up with our clinic in 2 weeks with Connie Potash, Connie Blanchard.  Obesity Connie Blanchard is currently in the action stage of change. As such, her goal is to continue with weight loss efforts She has agreed to follow the Category 3 plan Connie Blanchard has been instructed to work up to a goal of 150 minutes of combined cardio  and strengthening exercise per week for weight loss and overall health benefits. We discussed the following Behavioral Modification Strategies today: increasing lean protein intake, decreasing simple carbohydrates, increasing vegetables, increase H20 intake, no skipping meals, decrease eating out, holiday eating strategies, and celebration eating strategies  Connie Blanchard is to decrease snacking.   Connie Blanchard has agreed to follow up with our clinic in 2 weeks with Connie Potash, Connie Blanchard. She was informed of the importance of frequent follow up visits to maximize her success with intensive lifestyle modifications for her multiple health conditions.   OBESITY BEHAVIORAL INTERVENTION VISIT  Today's visit was # 15   Starting weight: 233 lbs Starting date: 10/12/17 Today's weight : 220 lbs  Today's date: 06/22/2018 Total lbs lost to date: 13    ASK: We discussed the diagnosis of obesity with Connie Blanchard today and Kinzie agreed to give [place] permission to discuss obesity behavioral modification therapy today.  ASSESS: Lulla has the diagnosis of obesity and her BMI today is 34.45 Larcenia is in the action stage of change   ADVISE: Loray was educated on the multiple health risks of obesity as well as the benefit of weight loss to improve her health. She was advised of the need for long term treatment and the importance of lifestyle modifications to improve her current health and to decrease her risk of future health problems.  AGREE: Multiple dietary modification options and treatment options were discussed and  Oretha agreed to follow the recommendations documented in the above note.  ARRANGE: Soundra was educated on the importance  of frequent visits to treat obesity as outlined per CMS and USPSTF guidelines and agreed to schedule her next follow up appointment today.  Wilhemena Durie, am acting as transcriptionist for [employer], DO  I have reviewed the above documentation for  accuracy and completeness, and I agree with the above. -Jearld Lesch, DO

## 2018-07-01 ENCOUNTER — Other Ambulatory Visit (INDEPENDENT_AMBULATORY_CARE_PROVIDER_SITE_OTHER): Payer: Self-pay | Admitting: Family Medicine

## 2018-07-01 DIAGNOSIS — E8881 Metabolic syndrome: Secondary | ICD-10-CM

## 2018-07-06 ENCOUNTER — Encounter (INDEPENDENT_AMBULATORY_CARE_PROVIDER_SITE_OTHER): Payer: Self-pay | Admitting: Physician Assistant

## 2018-07-06 ENCOUNTER — Ambulatory Visit (INDEPENDENT_AMBULATORY_CARE_PROVIDER_SITE_OTHER): Payer: 59 | Admitting: Physician Assistant

## 2018-07-06 VITALS — BP 137/79 | HR 69 | Temp 97.9°F | Ht 67.0 in | Wt 218.0 lb

## 2018-07-06 DIAGNOSIS — Z9189 Other specified personal risk factors, not elsewhere classified: Secondary | ICD-10-CM

## 2018-07-06 DIAGNOSIS — E8881 Metabolic syndrome: Secondary | ICD-10-CM | POA: Diagnosis not present

## 2018-07-06 DIAGNOSIS — E669 Obesity, unspecified: Secondary | ICD-10-CM | POA: Diagnosis not present

## 2018-07-06 DIAGNOSIS — F3289 Other specified depressive episodes: Secondary | ICD-10-CM | POA: Diagnosis not present

## 2018-07-06 DIAGNOSIS — Z6834 Body mass index (BMI) 34.0-34.9, adult: Secondary | ICD-10-CM

## 2018-07-06 MED ORDER — BUPROPION HCL ER (SR) 150 MG PO TB12: 150.0000 mg | ORAL_TABLET | Freq: Every day | ORAL | 0 refills | Status: DC

## 2018-07-06 MED ORDER — METFORMIN HCL 500 MG PO TABS: 500.0000 mg | ORAL_TABLET | Freq: Two times a day (BID) | ORAL | 0 refills | Status: DC

## 2018-07-06 NOTE — Progress Notes (Signed)
Office: [phone]  /  Fax: [phone]   HPI:   Chief Complaint: OBESITY Connie Blanchard is here to discuss her progress with her obesity treatment plan. She is on the Category 3 plan and is following her eating plan approximately 80-90 % of the time. She states she is doing yoga for 45 minutes 1 time per week. Connie Blanchard did really well with weight loss. She reports portion controlling over the holiday. She wants to talk about Christmas eating strategies.  Her weight is 218 lb (98.9 kg) today and has had a weight loss of 2 pounds over a period of 2 weeks since her last visit. She has lost 15 lbs since starting treatment with [place].  Insulin Resistance Connie Blanchard has a diagnosis of insulin resistance based on her elevated fasting insulin level >5. Although Connie Blanchard's blood glucose readings are still under good control, insulin resistance puts her at greater risk of metabolic syndrome and diabetes. She is on metforin and denies nausea, vomiting, or diarrhea. She continues to work on diet and exercise to decrease risk of diabetes. She denies polyphagia.  At risk for diabetes Connie Blanchard is at higher than average risk for developing diabetes due to her obesity and insulin resistance. She currently denies polyuria or polydipsia.  Depression with emotional eating behaviors Connie Blanchard's blood pressure is normal. She states that bupropion has helped a great deal with cravings. Connie Blanchard struggles with emotional eating and using food for comfort to the extent that it is negatively impacting her health. She often snacks when she is not hungry. Connie Blanchard sometimes feels she is out of control and then feels guilty that she made poor food choices. She has been working on behavior modification techniques to help reduce her emotional eating and has been somewhat successful. She shows no sign of suicidal or homicidal ideations.  Depression screen [facility], Connie Blanchard 2/9 10/12/2017 05/25/2017  Decreased Interest 3 0  Down, Depressed,  Hopeless 3 -  PHQ - 2 Score 6 0  Altered sleeping 3 -  Tired, decreased energy 3 -  Change in appetite 2 -  Feeling bad or failure about yourself  3 -  Trouble concentrating 2 -  Moving slowly or fidgety/restless 2 -  Suicidal thoughts 0 -  PHQ-9 Score 21 -  Difficult doing work/chores Somewhat difficult -    ALLERGIES: Allergies  Allergen Reactions  . Penicillins Rash    Has patient had a PCN reaction causing immediate rash, facial/tongue/throat swelling, SOB or lightheadedness with hypotension: Unknown Has patient had a PCN reaction causing severe rash involving mucus membranes or skin necrosis: Unknown Has patient had a PCN reaction that required hospitalization: No Has patient had a PCN reaction occurring within the last 10 years: No Childhood allergy  If all of the above answers are "NO", then may proceed with Cephalosporin use.   . Sulfa Antibiotics Rash    MEDICATIONS: Current Outpatient Medications on File Prior to Visit  Medication Sig Dispense Refill  . b complex vitamins tablet Take 1 tablet by mouth daily.    . cetirizine (ZYRTEC) 10 MG tablet Take 10 mg by mouth daily as needed for allergies.    Marland Kitchen diclofenac (VOLTAREN) 75 MG EC tablet Take 1 tablet (75 mg total) by mouth 2 (two) times daily. 30 tablet 0  . esomeprazole (NEXIUM) 40 MG capsule TAKE 1 CAPSULE BY MOUTH DAILY BEFORE BREAKFAST. 90 capsule 0  . Multiple Vitamin (MULTIVITAMIN) tablet Take 1 tablet by mouth daily.      . traMADol (ULTRAM) 50 MG tablet  Take 1 tablet (50 mg total) by mouth every 6 (six) hours as needed for severe pain. 6 tablet 0  . Vitamin D, Cholecalciferol, 50 MCG (2000 UT) CAPS Take 2,000 Units by mouth daily.     No current facility-administered medications on file prior to visit.     PAST MEDICAL HISTORY: Past Medical History:  Diagnosis Date  . Acute bronchitis 06/24/2012  . Acute bronchitis with asthma 06/24/2012  . Allergic state 10/15/2013  . Anxiety   . Back pain 10/15/2013    . Chronic sinusitis 02/2012   current runny nose of clear drainage  . Constipation   . Dental crowns present   . Depression   . GERD (gastroesophageal reflux disease)   . H/O gestational diabetes mellitus, not currently pregnant 07/29/2011   h/o   . Headache    history of migraines  . History of blood transfusion   . Hyperglycemia 05/25/2017  . Hypertension   . Iron deficiency anemia   . Leg edema   . Obese   . PCOS (polycystic ovarian syndrome)   . Right shoulder pain 05/25/2017    PAST SURGICAL HISTORY: Past Surgical History:  Procedure Laterality Date  . ABDOMINAL HYSTERECTOMY  02/2017   anemia, fibroids  . COLONOSCOPY    . DILATION AND CURETTAGE OF UTERUS  1987  . NASAL SEPTOPLASTY W/ TURBINOPLASTY  03/23/2012   Procedure: NASAL SEPTOPLASTY WITH TURBINATE REDUCTION;  Surgeon: Jerrell Belfast, MD;  Location: [place];  Service: ENT;  Laterality: Bilateral;  . ROBOTIC ASSISTED TOTAL HYSTERECTOMY WITH SALPINGECTOMY N/A 03/17/2017   Procedure: ROBOTIC ASSISTED TOTAL HYSTERECTOMY WITH SALPINGECTOMY;  Surgeon: Bobbye Charleston, MD;  Location: [facility];  Service: Gynecology;  Laterality: N/A;  . SINUS ENDO W/FUSION  03/23/2012   Procedure: ENDOSCOPIC SINUS SURGERY WITH FUSION NAVIGATION;  Surgeon: Jerrell Belfast, MD;  Location: [place];  Service: ENT;  Laterality: Bilateral;  with fusion scan  . UPPER GI ENDOSCOPY    . WISDOM TOOTH EXTRACTION      SOCIAL HISTORY: Social History   Tobacco Use  . Smoking status: Former Smoker    Packs/day: 1.00    Years: 19.00    Pack years: 19.00    Last attempt to quit: 07/28/2003    Years since quitting: 14.9  . Smokeless tobacco: Never Used  Substance Use Topics  . Alcohol use: Yes    Comment: occasionally  . Drug use: No    FAMILY HISTORY: Family History  Problem Relation Age of Onset  . Diabetes Mother        type 2  . Hypertension Mother   . Depression Mother   . Mental illness Mother         bi-polar  . Pulmonary embolism Mother   . Obesity Mother   . Heart disease Maternal Grandfather   . Stroke Maternal Grandfather   . Other Father        drug addiction  . Depression Maternal Aunt 46       suicide attempt with pneumonia    ROS: Review of Systems  Constitutional: Positive for weight loss.  Gastrointestinal: Negative for diarrhea, nausea and vomiting.  Genitourinary: Negative for frequency.  Endo/Heme/Allergies: Negative for polydipsia.       Negative polyphagia  Psychiatric/Behavioral: Positive for depression. Negative for suicidal ideas.    PHYSICAL EXAM: Blood pressure 137/79, pulse 69, temperature 97.9 F (36.6 C), temperature source Oral, height 5\' 7"  (1.702 m), weight 218 lb (98.9 kg), last menstrual period  03/03/2017, SpO2 99 %. Body mass index is 34.14 kg/m. Physical Exam  Constitutional: She is oriented to person, place, and time. She appears well-developed and well-nourished.  Cardiovascular: Normal rate.  Pulmonary/Chest: Effort normal.  Musculoskeletal: Normal range of motion.  Neurological: She is oriented to person, place, and time.  Skin: Skin is warm and dry.  Psychiatric: She has a normal mood and affect. Her behavior is normal.  Vitals reviewed.   RECENT LABS AND TESTS: BMET    Component Value Date/Time   NA 139 06/07/2018 0957   K 4.5 06/07/2018 0957   CL 101 06/07/2018 0957   CO2 22 06/07/2018 0957   GLUCOSE 83 06/07/2018 0957   GLUCOSE 80 05/25/2017 1442   BUN 15 06/07/2018 0957   CREATININE 0.92 06/07/2018 0957   CREATININE 0.99 03/31/2016 0952   CALCIUM 9.3 06/07/2018 0957   GFRNONAA 73 06/07/2018 0957   GFRNONAA 68 03/31/2016 0952   GFRAA 85 06/07/2018 0957   GFRAA 78 03/31/2016 0952   Lab Results  Component Value Date   HGBA1C 5.3 06/07/2018   HGBA1C 5.4 01/25/2018   HGBA1C 5.6 10/12/2017   HGBA1C 5.6 05/25/2017   Lab Results  Component Value Date   INSULIN 16.0 06/07/2018   INSULIN 11.2 01/25/2018   INSULIN 17.0  10/12/2017   CBC    Component Value Date/Time   WBC 6.0 10/12/2017 1122   WBC 6.9 05/25/2017 1442   RBC 4.47 10/12/2017 1122   RBC 4.41 05/25/2017 1442   HGB 13.6 10/12/2017 1122   HCT 40.9 10/12/2017 1122   PLT 242.0 05/25/2017 1442   MCV 92 10/12/2017 1122   MCH 30.4 10/12/2017 1122   MCH 29.8 03/05/2017 1100   MCHC 33.3 10/12/2017 1122   MCHC 33.2 05/25/2017 1442   RDW 13.6 10/12/2017 1122   LYMPHSABS 1.9 10/12/2017 1122   EOSABS 0.5 (H) 10/12/2017 1122   BASOSABS 0.0 10/12/2017 1122   Iron/TIBC/Ferritin/ %Sat    Component Value Date/Time   IRON 116 05/25/2017 1442   TIBC 397 10/29/2011 1552   IRONPCTSAT 7 (L) 10/29/2011 1552   Lipid Panel     Component Value Date/Time   CHOL 190 06/07/2018 0957   TRIG 80 06/07/2018 0957   HDL 56 06/07/2018 0957   CHOLHDL 4 05/25/2017 1442   VLDL 17.0 05/25/2017 1442   LDLCALC 118 (H) 06/07/2018 0957   Hepatic Function Panel     Component Value Date/Time   PROT 7.7 06/07/2018 0957   ALBUMIN 4.4 06/07/2018 0957   AST 16 06/07/2018 0957   ALT 11 06/07/2018 0957   ALKPHOS 55 06/07/2018 0957   BILITOT 0.5 06/07/2018 0957   BILIDIR 0.1 10/12/2013 1440   IBILI 0.3 10/12/2013 1440      Component Value Date/Time   TSH 2.600 10/12/2017 1122   TSH 2.32 05/25/2017 1442   TSH 3.24 05/19/2016 1550    ASSESSMENT AND PLAN: Insulin resistance - Plan: metFORMIN (GLUCOPHAGE) 500 MG tablet  Other depression - with emotional eating  - Plan: buPROPion (WELLBUTRIN SR) 150 MG 12 hr tablet  At risk for diabetes mellitus  Class 1 obesity with serious comorbidity and body mass index (BMI) of 34.0 to 34.9 in adult, unspecified obesity type  PLAN:  Insulin Resistance Connie Blanchard will continue to work on weight loss, exercise, and decreasing simple carbohydrates in her diet to help decrease the risk of diabetes. We dicussed metformin including benefits and risks. She was informed that eating too many simple carbohydrates or too many calories  at one sitting increases the likelihood of GI side effects. Connie Blanchard agrees to continue taking metformin 500 mg q AM #30 and we will refill for 1 month. Connie Blanchard agrees to follow up with our clinic in 3 weeks as directed to monitor her progress.  Diabetes risk counselling Connie Blanchard was given extended (15 minutes) diabetes prevention counseling today. She is 47 y.o. female and has risk factors for diabetes including obesity and insulin resistance. We discussed intensive lifestyle modifications today with an emphasis on weight loss as well as increasing exercise and decreasing simple carbohydrates in her diet.  Depression with Emotional Eating Behaviors We discussed behavior modification techniques today to help Connie Blanchard deal with her emotional eating and depression. Connie Blanchard to continue taking bupropion SR 150 mg qd #30 and we will refill for 1 month. Connie Blanchard agrees to follow up with our clinic in 3 weeks.  Obesity Connie Blanchard is currently in the action stage of change. As such, her goal is to continue with weight loss efforts She has agreed to follow the Category 3 plan Connie Blanchard has been instructed to work up to a goal of 150 minutes of combined cardio and strengthening exercise per week for weight loss and overall health benefits. We discussed the following Behavioral Modification Strategies today: work on meal planning and easy cooking plans and holiday eating strategies    Connie Blanchard has agreed to follow up with our clinic in 3 weeks. She was informed of the importance of frequent follow up visits to maximize her success with intensive lifestyle modifications for her multiple health conditions.   OBESITY BEHAVIORAL INTERVENTION VISIT  Today's visit was # 16  Starting weight: 233 lbs Starting date: 10/12/17 Today's weight : 218 lbs Today's date: 07/06/2018 Total lbs lost to date: 15    ASK: We discussed the diagnosis of obesity with Connie Blanchard today and Connie Blanchard agreed to  give [place] permission to discuss obesity behavioral modification therapy today.  ASSESS: Connie Blanchard has the diagnosis of obesity and her BMI today is 34.14 Connie Blanchard is in the action stage of change   ADVISE: Connie Blanchard was educated on the multiple health risks of obesity as well as the benefit of weight loss to improve her health. She was advised of the need for long term treatment and the importance of lifestyle modifications.  AGREE: Multiple dietary modification options and treatment options were discussed and  Bob agreed to the above obesity treatment plan.  Wilhemena Durie, am acting as transcriptionist for Abby Potash, PA-C I, Abby Potash, PA-C have reviewed above note and agree with its content

## 2018-07-28 ENCOUNTER — Encounter (INDEPENDENT_AMBULATORY_CARE_PROVIDER_SITE_OTHER): Payer: Self-pay

## 2018-07-28 ENCOUNTER — Ambulatory Visit (INDEPENDENT_AMBULATORY_CARE_PROVIDER_SITE_OTHER): Payer: 59 | Admitting: Physician Assistant

## 2018-08-10 ENCOUNTER — Encounter (INDEPENDENT_AMBULATORY_CARE_PROVIDER_SITE_OTHER): Payer: Self-pay | Admitting: Physician Assistant

## 2018-08-10 ENCOUNTER — Ambulatory Visit (INDEPENDENT_AMBULATORY_CARE_PROVIDER_SITE_OTHER): Payer: 59 | Admitting: Physician Assistant

## 2018-08-10 VITALS — BP 131/84 | HR 71 | Temp 97.9°F | Ht 67.0 in | Wt 221.0 lb

## 2018-08-10 DIAGNOSIS — F3289 Other specified depressive episodes: Secondary | ICD-10-CM | POA: Diagnosis not present

## 2018-08-10 DIAGNOSIS — E8881 Metabolic syndrome: Secondary | ICD-10-CM

## 2018-08-10 DIAGNOSIS — Z9189 Other specified personal risk factors, not elsewhere classified: Secondary | ICD-10-CM | POA: Diagnosis not present

## 2018-08-10 DIAGNOSIS — Z6834 Body mass index (BMI) 34.0-34.9, adult: Secondary | ICD-10-CM

## 2018-08-10 DIAGNOSIS — E669 Obesity, unspecified: Secondary | ICD-10-CM

## 2018-08-10 MED ORDER — BUPROPION HCL ER (SR) 150 MG PO TB12: 150.0000 mg | ORAL_TABLET | Freq: Every day | ORAL | 0 refills | Status: DC

## 2018-08-10 NOTE — Progress Notes (Signed)
Office: [phone]  /  Fax: [phone]   HPI:   Chief Complaint: OBESITY Connie Blanchard is here to discuss her progress with her obesity treatment plan. She is on the  follow the Category 3 plan and is following her eating plan approximately 50 % of the time. She states she is walking 30 minutes 2 times per week. Connie Blanchard reports struggling over the holidays with her plan. She is overeating her vegetable portion at dinner which is causing her to have trouble getting all of her food in at dinner. Her weight is 221 lb (100.2 kg) today and has gained 3 lbs since her last visit. She has lost 12 lbs since starting treatment with [place].  Insulin Resistance Connie Blanchard has a diagnosis of insulin resistance based on her elevated fasting insulin level >5. Although Connie Blanchard's blood glucose readings are still under good control, insulin resistance puts her at greater risk of metabolic syndrome and diabetes. She is taking metformin currently and continues to work on diet and exercise to decrease risk of diabetes. She denies nausea, vomiting or diarrhea. Her blood pressure is normal. She denies polyphagia.  Depression with emotional eating behaviors Connie Blanchard is struggling with emotional eating and using food for comfort to the extent that it is negatively impacting her health. She often snacks when she is not hungry. Connie Blanchard sometimes feels she is out of control and then feels guilty that she made poor food choices. She has been working on behavior modification techniques to help reduce her emotional eating and has been somewhat successful. Connie Blanchard is taking Wellbutrin. She shows no sign of suicidal or homicidal ideations.  Depression screen Connie Blanchard 2/9 10/12/2017 05/25/2017  Decreased Interest 3 0  Down, Depressed, Hopeless 3 -  PHQ - 2 Score 6 0  Altered sleeping 3 -  Tired, decreased energy 3 -  Change in appetite 2 -  Feeling bad or failure about yourself  3 -  Trouble concentrating 2 -  Moving slowly or  fidgety/restless 2 -  Suicidal thoughts 0 -  PHQ-9 Score 21 -  Difficult doing work/chores Somewhat difficult -    At risk for diabetes Connie Blanchard is at higher than average risk for developing diabetes due to her obesity. She currently denies polyuria or polydipsia.   ASSESSMENT AND PLAN:  Insulin resistance  Other depression - with emotional eating - Plan: buPROPion (WELLBUTRIN SR) 150 MG 12 hr tablet  At risk for diabetes mellitus  Class 1 obesity with serious comorbidity and body mass index (BMI) of 34.0 to 34.9 in adult, unspecified obesity type  Other depression - with emotional eating  - Plan: buPROPion (WELLBUTRIN SR) 150 MG 12 hr tablet  PLAN:  Insulin Resistance Connie Blanchard will continue to work on weight loss, exercise, and decreasing simple carbohydrates in her diet to help decrease the risk of diabetes. We dicussed metformin including benefits and risks. She was informed that eating too many simple carbohydrates or too many calories at one sitting increases the likelihood of GI side effects. Connie Blanchard agrees to continue taking metformin for now and prescription was not written today. Connie Blanchard agrees to follow up with our clinic in 2 weeks.  Depression with Emotional Eating Behaviors We discussed behavior modification techniques today to help Connie Blanchard deal with her emotional eating and depression. Connie Blanchard agrees to continue taking Wellbutrin SR 150 mg qd #30 with no refills. Connie Blanchard agrees to follow up with our clinic in 2 weeks.  Diabetes risk counseling Connie Blanchard was given extended (15 minutes) diabetes prevention counseling today.  She is 48 y.o. female and has risk factors for diabetes including obesity. We discussed intensive lifestyle modifications today with an emphasis on weight loss as well as increasing exercise and decreasing simple carbohydrates in her diet.  Obesity Connie Blanchard is currently in the action stage of change. As such, her goal is to continue with weight  loss efforts She has agreed to follow the Category 3 plan Connie Blanchard has been instructed to work up to a goal of 150 minutes of combined cardio and strengthening exercise per week for weight loss and overall health benefits. We discussed the following Behavioral Modification Strategies today: work on meal planning and easy cooking plans and better snacking choices.  Connie Blanchard has agreed to follow up with our clinic in 2 weeks. She was informed of the importance of frequent follow up visits to maximize her success with intensive lifestyle modifications for her multiple health conditions.  ALLERGIES: Allergies  Allergen Reactions  . Penicillins Rash    Has patient had a PCN reaction causing immediate rash, facial/tongue/throat swelling, SOB or lightheadedness with hypotension: Unknown Has patient had a PCN reaction causing severe rash involving mucus membranes or skin necrosis: Unknown Has patient had a PCN reaction that required hospitalization: No Has patient had a PCN reaction occurring within the last 10 years: No Childhood allergy  If all of the above answers are "NO", then may proceed with Cephalosporin use.   . Sulfa Antibiotics Rash    MEDICATIONS: Current Outpatient Medications on File Prior to Visit  Medication Sig Dispense Refill  . b complex vitamins tablet Take 1 tablet by mouth daily.    Marland Kitchen buPROPion (WELLBUTRIN SR) 150 MG 12 hr tablet Take 1 tablet (150 mg total) by mouth daily. 30 tablet 0  . cetirizine (ZYRTEC) 10 MG tablet Take 10 mg by mouth daily as needed for allergies.    Marland Kitchen diclofenac (VOLTAREN) 75 MG EC tablet Take 1 tablet (75 mg total) by mouth 2 (two) times daily. 30 tablet 0  . esomeprazole (NEXIUM) 40 MG capsule TAKE 1 CAPSULE BY MOUTH DAILY BEFORE BREAKFAST. 90 capsule 0  . metFORMIN (GLUCOPHAGE) 500 MG tablet Take 1 tablet (500 mg total) by mouth 2 (two) times daily with a meal. 60 tablet 0  . Multiple Vitamin (MULTIVITAMIN) tablet Take 1 tablet by mouth daily.       . traMADol (ULTRAM) 50 MG tablet Take 1 tablet (50 mg total) by mouth every 6 (six) hours as needed for severe pain. 6 tablet 0  . Vitamin D, Cholecalciferol, 50 MCG (2000 UT) CAPS Take 2,000 Units by mouth daily.     No current facility-administered medications on file prior to visit.     PAST MEDICAL HISTORY: Past Medical History:  Diagnosis Date  . Acute bronchitis 06/24/2012  . Acute bronchitis with asthma 06/24/2012  . Allergic state 10/15/2013  . Anxiety   . Back pain 10/15/2013  . Chronic sinusitis 02/2012   current runny nose of clear drainage  . Constipation   . Dental crowns present   . Depression   . GERD (gastroesophageal reflux disease)   . H/O gestational diabetes mellitus, not currently pregnant 07/29/2011   h/o   . Headache    history of migraines  . History of blood transfusion   . Hyperglycemia 05/25/2017  . Hypertension   . Iron deficiency anemia   . Leg edema   . Obese   . PCOS (polycystic ovarian syndrome)   . Right shoulder pain 05/25/2017    PAST  SURGICAL HISTORY: Past Surgical History:  Procedure Laterality Date  . ABDOMINAL HYSTERECTOMY  02/2017   anemia, fibroids  . COLONOSCOPY    . DILATION AND CURETTAGE OF UTERUS  1987  . NASAL SEPTOPLASTY W/ TURBINOPLASTY  03/23/2012   Procedure: NASAL SEPTOPLASTY WITH TURBINATE REDUCTION;  Surgeon: Jerrell Belfast, MD;  Location: [facility];  Service: ENT;  Laterality: Bilateral;  . ROBOTIC ASSISTED TOTAL HYSTERECTOMY WITH SALPINGECTOMY N/A 03/17/2017   Procedure: ROBOTIC ASSISTED TOTAL HYSTERECTOMY WITH SALPINGECTOMY;  Surgeon: Bobbye Charleston, MD;  Location: [facility];  Service: Gynecology;  Laterality: N/A;  . SINUS ENDO W/FUSION  03/23/2012   Procedure: ENDOSCOPIC SINUS SURGERY WITH FUSION NAVIGATION;  Surgeon: Jerrell Belfast, MD;  Location: [facility];  Service: ENT;  Laterality: Bilateral;  with fusion scan  . UPPER GI ENDOSCOPY    . WISDOM TOOTH EXTRACTION      SOCIAL  HISTORY: Social History   Tobacco Use  . Smoking status: Former Smoker    Packs/day: 1.00    Years: 19.00    Pack years: 19.00    Last attempt to quit: 07/28/2003    Years since quitting: 15.0  . Smokeless tobacco: Never Used  Substance Use Topics  . Alcohol use: Yes    Comment: occasionally  . Drug use: No    FAMILY HISTORY: Family History  Problem Relation Age of Onset  . Diabetes Mother        type 2  . Hypertension Mother   . Depression Mother   . Mental illness Mother        bi-polar  . Pulmonary embolism Mother   . Obesity Mother   . Heart disease Maternal Grandfather   . Stroke Maternal Grandfather   . Other Father        drug addiction  . Depression Maternal Aunt 46       suicide attempt with pneumonia    ROS: Review of Systems  Constitutional: Negative for weight loss.  Genitourinary: Negative for frequency.  Endo/Heme/Allergies: Negative for polydipsia.       Negative for polyphagia  Psychiatric/Behavioral: Negative for suicidal ideas.    PHYSICAL EXAM: Blood pressure 131/84, pulse 71, temperature 97.9 F (36.6 C), temperature source Oral, height 5\' 7"  (1.702 m), weight 221 lb (100.2 kg), last menstrual period 03/03/2017, SpO2 100 %. Body mass index is 34.61 kg/m. Physical Exam Vitals signs reviewed.  Constitutional:      Appearance: Normal appearance. She is obese.  Cardiovascular:     Rate and Rhythm: Normal rate.     Pulses: Normal pulses.  Pulmonary:     Effort: Pulmonary effort is normal.  Musculoskeletal: Normal range of motion.  Skin:    General: Skin is warm and dry.  Neurological:     Mental Status: She is alert and oriented to person, place, and time.  Psychiatric:        Mood and Affect: Mood normal.        Behavior: Behavior normal.     RECENT LABS AND TESTS: BMET    Component Value Date/Time   NA 139 06/07/2018 0957   K 4.5 06/07/2018 0957   CL 101 06/07/2018 0957   CO2 22 06/07/2018 0957   GLUCOSE 83 06/07/2018 0957     GLUCOSE 80 05/25/2017 1442   BUN 15 06/07/2018 0957   CREATININE 0.92 06/07/2018 0957   CREATININE 0.99 03/31/2016 0952   CALCIUM 9.3 06/07/2018 0957   GFRNONAA 73 06/07/2018 0957   GFRNONAA 68 03/31/2016  [place] 85 06/07/2018 0957   GFRAA 78 03/31/2016 0952   Lab Results  Component Value Date   HGBA1C 5.3 06/07/2018   HGBA1C 5.4 01/25/2018   HGBA1C 5.6 10/12/2017   HGBA1C 5.6 05/25/2017   Lab Results  Component Value Date   INSULIN 16.0 06/07/2018   INSULIN 11.2 01/25/2018   INSULIN 17.0 10/12/2017   CBC    Component Value Date/Time   WBC 6.0 10/12/2017 1122   WBC 6.9 05/25/2017 1442   RBC 4.47 10/12/2017 1122   RBC 4.41 05/25/2017 1442   HGB 13.6 10/12/2017 1122   HCT 40.9 10/12/2017 1122   PLT 242.0 05/25/2017 1442   MCV 92 10/12/2017 1122   MCH 30.4 10/12/2017 1122   MCH 29.8 03/05/2017 1100   MCHC 33.3 10/12/2017 1122   MCHC 33.2 05/25/2017 1442   RDW 13.6 10/12/2017 1122   LYMPHSABS 1.9 10/12/2017 1122   EOSABS 0.5 (H) 10/12/2017 1122   BASOSABS 0.0 10/12/2017 1122   Iron/TIBC/Ferritin/ %Sat    Component Value Date/Time   IRON 116 05/25/2017 1442   TIBC 397 10/29/2011 1552   IRONPCTSAT 7 (L) 10/29/2011 1552   Lipid Panel     Component Value Date/Time   CHOL 190 06/07/2018 0957   TRIG 80 06/07/2018 0957   HDL 56 06/07/2018 0957   CHOLHDL 4 05/25/2017 1442   VLDL 17.0 05/25/2017 1442   LDLCALC 118 (H) 06/07/2018 0957   Hepatic Function Panel     Component Value Date/Time   PROT 7.7 06/07/2018 0957   ALBUMIN 4.4 06/07/2018 0957   AST 16 06/07/2018 0957   ALT 11 06/07/2018 0957   ALKPHOS 55 06/07/2018 0957   BILITOT 0.5 06/07/2018 0957   BILIDIR 0.1 10/12/2013 1440   IBILI 0.3 10/12/2013 1440      Component Value Date/Time   TSH 2.600 10/12/2017 1122   TSH 2.32 05/25/2017 1442   TSH 3.24 05/19/2016 1550      OBESITY BEHAVIORAL INTERVENTION VISIT  Today's visit was # 17   Starting weight: 233 lbs Starting date:  10/12/2017 Today's weight :: 221 lb  Today's date: 08/10/2018 Total lbs lost to date: 12  ASK: We discussed the diagnosis of obesity with Connie Blanchard today and Connie Blanchard agreed to give [place] permission to discuss obesity behavioral modification therapy today.  ASSESS: Connie Blanchard has the diagnosis of obesity and her BMI today is 34.61 Connie Blanchard is in the action stage of change   ADVISE: Connie Blanchard was educated on the multiple health risks of obesity as well as the benefit of weight loss to improve her health. She was advised of the need for long term treatment and the importance of lifestyle modifications to improve her current health and to decrease her risk of future health problems.  AGREE: Multiple dietary modification options and treatment options were discussed and  Connie Blanchard agreed to follow the recommendations documented in the above note.  ARRANGE: Connie Blanchard was educated on the importance of frequent visits to treat obesity as outlined per CMS and USPSTF guidelines and agreed to schedule her next follow up appointment today.  I, Tammy Wysor, am acting as Location manager for [employer], PA-C I, Abby Potash, PA-C have reviewed above note and agree with its content

## 2018-08-12 ENCOUNTER — Other Ambulatory Visit (INDEPENDENT_AMBULATORY_CARE_PROVIDER_SITE_OTHER): Payer: Self-pay | Admitting: Physician Assistant

## 2018-08-12 DIAGNOSIS — E8881 Metabolic syndrome: Secondary | ICD-10-CM

## 2018-08-24 ENCOUNTER — Encounter (INDEPENDENT_AMBULATORY_CARE_PROVIDER_SITE_OTHER): Payer: Self-pay

## 2018-08-24 ENCOUNTER — Ambulatory Visit (INDEPENDENT_AMBULATORY_CARE_PROVIDER_SITE_OTHER): Payer: 59 | Admitting: Physician Assistant

## 2018-08-29 ENCOUNTER — Ambulatory Visit (INDEPENDENT_AMBULATORY_CARE_PROVIDER_SITE_OTHER): Payer: 59 | Admitting: Physician Assistant

## 2018-08-29 ENCOUNTER — Encounter (INDEPENDENT_AMBULATORY_CARE_PROVIDER_SITE_OTHER): Payer: Self-pay | Admitting: Physician Assistant

## 2018-08-29 VITALS — BP 135/80 | HR 64 | Temp 97.9°F | Ht 67.0 in | Wt 219.0 lb

## 2018-08-29 DIAGNOSIS — E669 Obesity, unspecified: Secondary | ICD-10-CM

## 2018-08-29 DIAGNOSIS — Z6834 Body mass index (BMI) 34.0-34.9, adult: Secondary | ICD-10-CM

## 2018-08-29 DIAGNOSIS — E8881 Metabolic syndrome: Secondary | ICD-10-CM | POA: Diagnosis not present

## 2018-08-29 NOTE — Progress Notes (Signed)
Office: [phone]  /  Fax: [phone]   HPI:   Chief Complaint: OBESITY Tyisha is here to discuss her progress with her obesity treatment plan. She is on the Category 3 plan and is following her eating plan approximately 75% of the time. She states she is exercising 90 minutes 3 times per week. Gustavia did well with weight loss. She reports that she has been sick with a GI virus over the last 2 days.  Her weight is 219 lb (99.3 kg) today and has had a weight loss of 2  pounds over a period of 2-3  weeks since her last visit. She has lost 14 lbs since starting treatment with [place].  Insulin Resistance Brailyn has a diagnosis of insulin resistance based on her elevated fasting insulin level >5. Although Naydene's blood glucose readings are still under good control, insulin resistance puts her at greater risk of metabolic syndrome and diabetes. She is on metformin and denies nausea, vomiting, diarrhea, or polyphagia. She continues to work on diet and exercise to decrease risk of diabetes.  ALLERGIES: Allergies  Allergen Reactions  . Penicillins Rash    Has patient had a PCN reaction causing immediate rash, facial/tongue/throat swelling, SOB or lightheadedness with hypotension: Unknown Has patient had a PCN reaction causing severe rash involving mucus membranes or skin necrosis: Unknown Has patient had a PCN reaction that required hospitalization: No Has patient had a PCN reaction occurring within the last 10 years: No Childhood allergy  If all of the above answers are "NO", then may proceed with Cephalosporin use.   . Sulfa Antibiotics Rash    MEDICATIONS: Current Outpatient Medications on File Prior to Visit  Medication Sig Dispense Refill  . b complex vitamins tablet Take 1 tablet by mouth daily.    Marland Kitchen buPROPion (WELLBUTRIN SR) 150 MG 12 hr tablet Take 1 tablet (150 mg total) by mouth daily. 30 tablet 0  . cetirizine (ZYRTEC) 10 MG tablet Take 10 mg by mouth daily as needed  for allergies.    Marland Kitchen diclofenac (VOLTAREN) 75 MG EC tablet Take 1 tablet (75 mg total) by mouth 2 (two) times daily. 30 tablet 0  . esomeprazole (NEXIUM) 40 MG capsule TAKE 1 CAPSULE BY MOUTH DAILY BEFORE BREAKFAST. 90 capsule 0  . metFORMIN (GLUCOPHAGE) 500 MG tablet TAKE 1 TABLET (500 MG TOTAL) BY MOUTH 2 (TWO) TIMES DAILY WITH A MEAL. 60 tablet 0  . Multiple Vitamin (MULTIVITAMIN) tablet Take 1 tablet by mouth daily.      . traMADol (ULTRAM) 50 MG tablet Take 1 tablet (50 mg total) by mouth every 6 (six) hours as needed for severe pain. 6 tablet 0  . Vitamin D, Cholecalciferol, 50 MCG (2000 UT) CAPS Take 2,000 Units by mouth daily.     No current facility-administered medications on file prior to visit.     PAST MEDICAL HISTORY: Past Medical History:  Diagnosis Date  . Acute bronchitis 06/24/2012  . Acute bronchitis with asthma 06/24/2012  . Allergic state 10/15/2013  . Anxiety   . Back pain 10/15/2013  . Chronic sinusitis 02/2012   current runny nose of clear drainage  . Constipation   . Dental crowns present   . Depression   . GERD (gastroesophageal reflux disease)   . H/O gestational diabetes mellitus, not currently pregnant 07/29/2011   h/o   . Headache    history of migraines  . History of blood transfusion   . Hyperglycemia 05/25/2017  . Hypertension   . Iron  deficiency anemia   . Leg edema   . Obese   . PCOS (polycystic ovarian syndrome)   . Right shoulder pain 05/25/2017    PAST SURGICAL HISTORY: Past Surgical History:  Procedure Laterality Date  . ABDOMINAL HYSTERECTOMY  02/2017   anemia, fibroids  . COLONOSCOPY    . DILATION AND CURETTAGE OF UTERUS  1987  . NASAL SEPTOPLASTY W/ TURBINOPLASTY  03/23/2012   Procedure: NASAL SEPTOPLASTY WITH TURBINATE REDUCTION;  Surgeon: Jerrell Belfast, MD;  Location: [facility];  Service: ENT;  Laterality: Bilateral;  . ROBOTIC ASSISTED TOTAL HYSTERECTOMY WITH SALPINGECTOMY N/A 03/17/2017   Procedure: ROBOTIC  ASSISTED TOTAL HYSTERECTOMY WITH SALPINGECTOMY;  Surgeon: Bobbye Charleston, MD;  Location: [facility];  Service: Gynecology;  Laterality: N/A;  . SINUS ENDO W/FUSION  03/23/2012   Procedure: ENDOSCOPIC SINUS SURGERY WITH FUSION NAVIGATION;  Surgeon: Jerrell Belfast, MD;  Location: [facility];  Service: ENT;  Laterality: Bilateral;  with fusion scan  . UPPER GI ENDOSCOPY    . WISDOM TOOTH EXTRACTION      SOCIAL HISTORY: Social History   Tobacco Use  . Smoking status: Former Smoker    Packs/day: 1.00    Years: 19.00    Pack years: 19.00    Last attempt to quit: 07/28/2003    Years since quitting: 15.0  . Smokeless tobacco: Never Used  Substance Use Topics  . Alcohol use: Yes    Comment: occasionally  . Drug use: No    FAMILY HISTORY: Family History  Problem Relation Age of Onset  . Diabetes Mother        type 2  . Hypertension Mother   . Depression Mother   . Mental illness Mother        bi-polar  . Pulmonary embolism Mother   . Obesity Mother   . Heart disease Maternal Grandfather   . Stroke Maternal Grandfather   . Other Father        drug addiction  . Depression Maternal Aunt 46       suicide attempt with pneumonia    ROS: Review of Systems  Constitutional: Positive for weight loss.  Gastrointestinal: Negative for diarrhea, nausea and vomiting.  Endo/Heme/Allergies:       Negative polyphagia    PHYSICAL EXAM: Blood pressure 135/80, pulse 64, temperature 97.9 F (36.6 C), temperature source Oral, height 5\' 7"  (1.702 m), weight 219 lb (99.3 kg), last menstrual period 03/03/2017, SpO2 99 %. Body mass index is 34.3 kg/m. Physical Exam Vitals signs reviewed.  Constitutional:      Appearance: Normal appearance. She is obese.  Cardiovascular:     Rate and Rhythm: Normal rate.     Pulses: Normal pulses.  Pulmonary:     Effort: Pulmonary effort is normal.     Breath sounds: Normal breath sounds.  Musculoskeletal: Normal range of motion.  Skin:     General: Skin is warm and dry.  Neurological:     Mental Status: She is alert and oriented to person, place, and time.  Psychiatric:        Mood and Affect: Mood normal.        Behavior: Behavior normal.     RECENT LABS AND TESTS: BMET    Component Value Date/Time   NA 139 06/07/2018 0957   K 4.5 06/07/2018 0957   CL 101 06/07/2018 0957   CO2 22 06/07/2018 0957   GLUCOSE 83 06/07/2018 0957   GLUCOSE 80 05/25/2017 1442   BUN 15 06/07/2018  0957   CREATININE 0.92 06/07/2018 0957   CREATININE 0.99 03/31/2016 0952   CALCIUM 9.3 06/07/2018 0957   GFRNONAA 73 06/07/2018 0957   GFRNONAA 68 03/31/2016 0952   GFRAA 85 06/07/2018 0957   GFRAA 78 03/31/2016 0952   Lab Results  Component Value Date   HGBA1C 5.3 06/07/2018   HGBA1C 5.4 01/25/2018   HGBA1C 5.6 10/12/2017   HGBA1C 5.6 05/25/2017   Lab Results  Component Value Date   INSULIN 16.0 06/07/2018   INSULIN 11.2 01/25/2018   INSULIN 17.0 10/12/2017   CBC    Component Value Date/Time   WBC 6.0 10/12/2017 1122   WBC 6.9 05/25/2017 1442   RBC 4.47 10/12/2017 1122   RBC 4.41 05/25/2017 1442   HGB 13.6 10/12/2017 1122   HCT 40.9 10/12/2017 1122   PLT 242.0 05/25/2017 1442   MCV 92 10/12/2017 1122   MCH 30.4 10/12/2017 1122   MCH 29.8 03/05/2017 1100   MCHC 33.3 10/12/2017 1122   MCHC 33.2 05/25/2017 1442   RDW 13.6 10/12/2017 1122   LYMPHSABS 1.9 10/12/2017 1122   EOSABS 0.5 (H) 10/12/2017 1122   BASOSABS 0.0 10/12/2017 1122   Iron/TIBC/Ferritin/ %Sat    Component Value Date/Time   IRON 116 05/25/2017 1442   TIBC 397 10/29/2011 1552   IRONPCTSAT 7 (L) 10/29/2011 1552   Lipid Panel     Component Value Date/Time   CHOL 190 06/07/2018 0957   TRIG 80 06/07/2018 0957   HDL 56 06/07/2018 0957   CHOLHDL 4 05/25/2017 1442   VLDL 17.0 05/25/2017 1442   LDLCALC 118 (H) 06/07/2018 0957   Hepatic Function Panel     Component Value Date/Time   PROT 7.7 06/07/2018 0957   ALBUMIN 4.4 06/07/2018 0957   AST 16  06/07/2018 0957   ALT 11 06/07/2018 0957   ALKPHOS 55 06/07/2018 0957   BILITOT 0.5 06/07/2018 0957   BILIDIR 0.1 10/12/2013 1440   IBILI 0.3 10/12/2013 1440      Component Value Date/Time   TSH 2.600 10/12/2017 1122   TSH 2.32 05/25/2017 1442   TSH 3.24 05/19/2016 1550    ASSESSMENT AND PLAN: Insulin resistance  Class 1 obesity with serious comorbidity and body mass index (BMI) of 34.0 to 34.9 in adult, unspecified obesity type  PLAN:  Insulin Resistance Doryce will continue to work on weight loss, exercise, and decreasing simple carbohydrates in her diet to help decrease the risk of diabetes. We dicussed metformin including benefits and risks. She was informed that eating too many simple carbohydrates or too many calories at one sitting increases the likelihood of GI side effects. Jamiah agrees to continue taking metformin and she agrees to follow-up with our clinic in 2 weeks as directed to monitor her progress.  I spent > than 50% of the 15 minute visit on counseling as documented in the note.  Obesity Fran is currently in the action stage of change. As such, her goal is to continue with weight loss efforts She has agreed to keep a food journal with 450-600 calories and 40 grams of protein at supper daily and follow the Category 3 plan. Deaven has been instructed to work up to a goal of 150 minutes of combined cardio and strengthening exercise per week for weight loss and overall health benefits. We discussed the following Behavioral Modification Strategies today: work on meal planning and easy cooking plans and planning for success.  Ayisha has agreed to follow up with our clinic in 2 weeks.  She was informed of the importance of frequent follow up visits to maximize her success with intensive lifestyle modifications for her multiple health conditions.   OBESITY BEHAVIORAL INTERVENTION VISIT  Today's visit was # 18   Starting weight: 233 lbs Starting date:  10/12/2017 Today's weight: 219 lbs  Today's date: 08/29/2018 Total lbs lost to date: 14  ASK: We discussed the diagnosis of obesity with [name] today and Shakisha agreed to give [place] permission to discuss obesity behavioral modification therapy today.  ASSESS: Rogena has the diagnosis of obesity and her BMI today is 34.29 Cristiana is in the action stage of change   ADVISE: Kasandra was educated on the multiple health risks of obesity as well as the benefit of weight loss to improve her health. She was advised of the need for long term treatment and the importance of lifestyle modifications.  AGREE: Multiple dietary modification options and treatment options were discussed and  Roxy agreed to the above obesity treatment plan.  Migdalia Dk, am acting as transcriptionist for Abby Potash, PA-C I, Abby Potash, PA-C have reviewed above note and agree with its content

## 2018-09-12 ENCOUNTER — Ambulatory Visit (INDEPENDENT_AMBULATORY_CARE_PROVIDER_SITE_OTHER): Payer: 59 | Admitting: Physician Assistant

## 2018-09-12 ENCOUNTER — Encounter (INDEPENDENT_AMBULATORY_CARE_PROVIDER_SITE_OTHER): Payer: Self-pay | Admitting: Physician Assistant

## 2018-09-12 VITALS — BP 147/78 | HR 62 | Temp 97.8°F | Ht 67.0 in | Wt 220.0 lb

## 2018-09-12 DIAGNOSIS — E669 Obesity, unspecified: Secondary | ICD-10-CM | POA: Diagnosis not present

## 2018-09-12 DIAGNOSIS — Z6834 Body mass index (BMI) 34.0-34.9, adult: Secondary | ICD-10-CM | POA: Diagnosis not present

## 2018-09-12 DIAGNOSIS — E559 Vitamin D deficiency, unspecified: Secondary | ICD-10-CM

## 2018-09-12 NOTE — Progress Notes (Signed)
Office: [phone]  /  Fax: [phone]   HPI:   Chief Complaint: OBESITY Taleya is here to discuss her progress with her obesity treatment plan. She is on the Category 3 plan and keeping a food journal with 450-600 calories and 40 grams of protein at supper daily and is following her eating plan approximately 60-70% of the time. She states she is walking and doing yoga 45 minutes 1 time per week. Giada reports that she has had bad reflux which has caused her to eat less protein. She is not taking her Nexium daily. Her weight is 220 lb (99.8 kg) today and has had a weight gain of 1 pound since her last visit. She has lost 13 lbs since starting treatment with [place].  Vitamin D deficiency Carren has a diagnosis of Vitamin D deficiency. She is currently taking Vit D and denies nausea, vomiting or muscle weakness.  ASSESSMENT AND PLAN:  Vitamin D deficiency  Class 1 obesity with serious comorbidity and body mass index (BMI) of 34.0 to 34.9 in adult, unspecified obesity type  PLAN:  Vitamin D Deficiency Tennille was informed that low Vitamin D levels contributes to fatigue and are associated with obesity, breast, and colon cancer. Miia agrees to continue to take Vit D and will follow-up for routine testing of Vitamin D at her next visit. She was informed of the risk of over-replacement of Vitamin D and agrees to not increase her dose unless she discusses this with [place] first. Chayna agrees to follow-up with our clinic in 2 weeks.  I spent > than 50% of the 15 minute visit on counseling as documented in the note.  Obesity Brylie is currently in the action stage of change. As such, her goal is to continue with weight loss efforts. She has agreed to follow the Category 3 plan and keep a food journal with 450-600 calories and 40 grams of protein at supper daily. Razia has been instructed to work up to a goal of 150 minutes of combined cardio and strengthening exercise per  week for weight loss and overall health benefits. We discussed the following Behavioral Modification Strategies today: increasing lean protein intake and work on meal planning and easy cooking plans.  Cathleen has agreed to follow-up with our clinic in 2 weeks. She was informed of the importance of frequent follow up visits to maximize her success with intensive lifestyle modifications for her multiple health conditions.  ALLERGIES: Allergies  Allergen Reactions  . Penicillins Rash    Has patient had a PCN reaction causing immediate rash, facial/tongue/throat swelling, SOB or lightheadedness with hypotension: Unknown Has patient had a PCN reaction causing severe rash involving mucus membranes or skin necrosis: Unknown Has patient had a PCN reaction that required hospitalization: No Has patient had a PCN reaction occurring within the last 10 years: No Childhood allergy  If all of the above answers are "NO", then may proceed with Cephalosporin use.   . Sulfa Antibiotics Rash    MEDICATIONS: Current Outpatient Medications on File Prior to Visit  Medication Sig Dispense Refill  . b complex vitamins tablet Take 1 tablet by mouth daily.    Marland Kitchen buPROPion (WELLBUTRIN SR) 150 MG 12 hr tablet Take 1 tablet (150 mg total) by mouth daily. 30 tablet 0  . cetirizine (ZYRTEC) 10 MG tablet Take 10 mg by mouth daily as needed for allergies.    Marland Kitchen diclofenac (VOLTAREN) 75 MG EC tablet Take 1 tablet (75 mg total) by mouth 2 (two) times  daily. 30 tablet 0  . esomeprazole (NEXIUM) 40 MG capsule TAKE 1 CAPSULE BY MOUTH DAILY BEFORE BREAKFAST. 90 capsule 0  . metFORMIN (GLUCOPHAGE) 500 MG tablet TAKE 1 TABLET (500 MG TOTAL) BY MOUTH 2 (TWO) TIMES DAILY WITH A MEAL. 60 tablet 0  . Multiple Vitamin (MULTIVITAMIN) tablet Take 1 tablet by mouth daily.      . traMADol (ULTRAM) 50 MG tablet Take 1 tablet (50 mg total) by mouth every 6 (six) hours as needed for severe pain. 6 tablet 0  . Vitamin D, Cholecalciferol, 50  MCG (2000 UT) CAPS Take 2,000 Units by mouth daily.     No current facility-administered medications on file prior to visit.     PAST MEDICAL HISTORY: Past Medical History:  Diagnosis Date  . Acute bronchitis 06/24/2012  . Acute bronchitis with asthma 06/24/2012  . Allergic state 10/15/2013  . Anxiety   . Back pain 10/15/2013  . Chronic sinusitis 02/2012   current runny nose of clear drainage  . Constipation   . Dental crowns present   . Depression   . GERD (gastroesophageal reflux disease)   . H/O gestational diabetes mellitus, not currently pregnant 07/29/2011   h/o   . Headache    history of migraines  . History of blood transfusion   . Hyperglycemia 05/25/2017  . Hypertension   . Iron deficiency anemia   . Leg edema   . Obese   . PCOS (polycystic ovarian syndrome)   . Right shoulder pain 05/25/2017    PAST SURGICAL HISTORY: Past Surgical History:  Procedure Laterality Date  . ABDOMINAL HYSTERECTOMY  02/2017   anemia, fibroids  . COLONOSCOPY    . DILATION AND CURETTAGE OF UTERUS  1987  . NASAL SEPTOPLASTY W/ TURBINOPLASTY  03/23/2012   Procedure: NASAL SEPTOPLASTY WITH TURBINATE REDUCTION;  Surgeon: Jerrell Belfast, MD;  Location: [facility];  Service: ENT;  Laterality: Bilateral;  . ROBOTIC ASSISTED TOTAL HYSTERECTOMY WITH SALPINGECTOMY N/A 03/17/2017   Procedure: ROBOTIC ASSISTED TOTAL HYSTERECTOMY WITH SALPINGECTOMY;  Surgeon: Bobbye Charleston, MD;  Location: [facility];  Service: Gynecology;  Laterality: N/A;  . SINUS ENDO W/FUSION  03/23/2012   Procedure: ENDOSCOPIC SINUS SURGERY WITH FUSION NAVIGATION;  Surgeon: Jerrell Belfast, MD;  Location: [facility];  Service: ENT;  Laterality: Bilateral;  with fusion scan  . UPPER GI ENDOSCOPY    . WISDOM TOOTH EXTRACTION      SOCIAL HISTORY: Social History   Tobacco Use  . Smoking status: Former Smoker    Packs/day: 1.00    Years: 19.00    Pack years: 19.00    Last attempt to quit: 07/28/2003      Years since quitting: 15.1  . Smokeless tobacco: Never Used  Substance Use Topics  . Alcohol use: Yes    Comment: occasionally  . Drug use: No    FAMILY HISTORY: Family History  Problem Relation Age of Onset  . Diabetes Mother        type 2  . Hypertension Mother   . Depression Mother   . Mental illness Mother        bi-polar  . Pulmonary embolism Mother   . Obesity Mother   . Heart disease Maternal Grandfather   . Stroke Maternal Grandfather   . Other Father        drug addiction  . Depression Maternal Aunt 46       suicide attempt with pneumonia   ROS: Review of Systems  Constitutional: Negative  for weight loss.  Gastrointestinal: Negative for nausea and vomiting.  Musculoskeletal:       Negative for muscle weakness.  Endo/Heme/Allergies:       Negative for hypoglycemia.   PHYSICAL EXAM: Blood pressure (!) 147/78, pulse 62, temperature 97.8 F (36.6 C), temperature source Oral, height 5\' 7"  (1.702 m), weight 220 lb (99.8 kg), last menstrual period 03/03/2017, SpO2 98 %. Body mass index is 34.46 kg/m. Physical Exam Vitals signs reviewed.  Constitutional:      Appearance: Normal appearance. She is obese.  Cardiovascular:     Rate and Rhythm: Normal rate.     Pulses: Normal pulses.  Pulmonary:     Effort: Pulmonary effort is normal.     Breath sounds: Normal breath sounds.  Musculoskeletal: Normal range of motion.  Skin:    General: Skin is warm and dry.  Neurological:     Mental Status: She is alert and oriented to person, place, and time.  Psychiatric:        Behavior: Behavior normal.   RECENT LABS AND TESTS: BMET    Component Value Date/Time   NA 139 06/07/2018 0957   K 4.5 06/07/2018 0957   CL 101 06/07/2018 0957   CO2 22 06/07/2018 0957   GLUCOSE 83 06/07/2018 0957   GLUCOSE 80 05/25/2017 1442   BUN 15 06/07/2018 0957   CREATININE 0.92 06/07/2018 0957   CREATININE 0.99 03/31/2016 0952   CALCIUM 9.3 06/07/2018 0957   GFRNONAA 73  06/07/2018 0957   GFRNONAA 68 03/31/2016 0952   GFRAA 85 06/07/2018 0957   GFRAA 78 03/31/2016 0952   Lab Results  Component Value Date   HGBA1C 5.3 06/07/2018   HGBA1C 5.4 01/25/2018   HGBA1C 5.6 10/12/2017   HGBA1C 5.6 05/25/2017   Lab Results  Component Value Date   INSULIN 16.0 06/07/2018   INSULIN 11.2 01/25/2018   INSULIN 17.0 10/12/2017   CBC    Component Value Date/Time   WBC 6.0 10/12/2017 1122   WBC 6.9 05/25/2017 1442   RBC 4.47 10/12/2017 1122   RBC 4.41 05/25/2017 1442   HGB 13.6 10/12/2017 1122   HCT 40.9 10/12/2017 1122   PLT 242.0 05/25/2017 1442   MCV 92 10/12/2017 1122   MCH 30.4 10/12/2017 1122   MCH 29.8 03/05/2017 1100   MCHC 33.3 10/12/2017 1122   MCHC 33.2 05/25/2017 1442   RDW 13.6 10/12/2017 1122   LYMPHSABS 1.9 10/12/2017 1122   EOSABS 0.5 (H) 10/12/2017 1122   BASOSABS 0.0 10/12/2017 1122   Iron/TIBC/Ferritin/ %Sat    Component Value Date/Time   IRON 116 05/25/2017 1442   TIBC 397 10/29/2011 1552   IRONPCTSAT 7 (L) 10/29/2011 1552   Lipid Panel     Component Value Date/Time   CHOL 190 06/07/2018 0957   TRIG 80 06/07/2018 0957   HDL 56 06/07/2018 0957   CHOLHDL 4 05/25/2017 1442   VLDL 17.0 05/25/2017 1442   LDLCALC 118 (H) 06/07/2018 0957   Hepatic Function Panel     Component Value Date/Time   PROT 7.7 06/07/2018 0957   ALBUMIN 4.4 06/07/2018 0957   AST 16 06/07/2018 0957   ALT 11 06/07/2018 0957   ALKPHOS 55 06/07/2018 0957   BILITOT 0.5 06/07/2018 0957   BILIDIR 0.1 10/12/2013 1440   IBILI 0.3 10/12/2013 1440      Component Value Date/Time   TSH 2.600 10/12/2017 1122   TSH 2.32 05/25/2017 1442   TSH 3.24 05/19/2016 1550    Ref. Range  06/07/2018 09:57  Vitamin D, 25-Hydroxy Latest Ref Range: 30.0 - 100.0 ng/mL 63.3   OBESITY BEHAVIORAL INTERVENTION VISIT  Today's visit was #19  Starting weight: 233 lbs Starting date: 10/12/2017 Today's weight: 220 lbs  Today's date: 09/12/2018 Total lbs lost to date: 13    ASK: We discussed the diagnosis of obesity with [name] today and Dymphna agreed to give [place] permission to discuss obesity behavioral modification therapy today.  ASSESS: Maddyn has the diagnosis of obesity and her BMI today is 34.46. Keeghan is in the action stage of change.  ADVISE: Kolby was educated on the multiple health risks of obesity as well as the benefit of weight loss to improve her health. She was advised of the need for long term treatment and the importance of lifestyle modifications to improve her current health and to decrease her risk of future health problems.  AGREE: Multiple dietary modification options and treatment options were discussed and  Juliane agreed to follow the recommendations documented in the above note.  ARRANGE: Tyianna was educated on the importance of frequent visits to treat obesity as outlined per CMS and USPSTF guidelines and agreed to schedule her next follow up appointment today.  Migdalia Dk, am acting as transcriptionist for Abby Potash, PA-C I, Abby Potash, PA-C have reviewed above note and agree with its content

## 2018-09-16 ENCOUNTER — Ambulatory Visit: Payer: Self-pay | Admitting: Family Medicine

## 2018-09-18 ENCOUNTER — Other Ambulatory Visit (INDEPENDENT_AMBULATORY_CARE_PROVIDER_SITE_OTHER): Payer: Self-pay | Admitting: Physician Assistant

## 2018-09-18 DIAGNOSIS — F3289 Other specified depressive episodes: Secondary | ICD-10-CM

## 2018-09-27 ENCOUNTER — Ambulatory Visit (INDEPENDENT_AMBULATORY_CARE_PROVIDER_SITE_OTHER): Payer: 59 | Admitting: Physician Assistant

## 2018-09-27 VITALS — BP 129/76 | HR 66 | Temp 98.0°F | Ht 67.0 in | Wt 219.0 lb

## 2018-09-27 DIAGNOSIS — E8881 Metabolic syndrome: Secondary | ICD-10-CM

## 2018-09-27 DIAGNOSIS — E7849 Other hyperlipidemia: Secondary | ICD-10-CM | POA: Diagnosis not present

## 2018-09-27 DIAGNOSIS — E669 Obesity, unspecified: Secondary | ICD-10-CM

## 2018-09-27 DIAGNOSIS — F3289 Other specified depressive episodes: Secondary | ICD-10-CM

## 2018-09-27 DIAGNOSIS — Z9189 Other specified personal risk factors, not elsewhere classified: Secondary | ICD-10-CM

## 2018-09-27 DIAGNOSIS — Z6834 Body mass index (BMI) 34.0-34.9, adult: Secondary | ICD-10-CM

## 2018-09-27 MED ORDER — BUPROPION HCL ER (SR) 150 MG PO TB12: 150.0000 mg | ORAL_TABLET | Freq: Every day | ORAL | 0 refills | Status: DC

## 2018-09-27 NOTE — Progress Notes (Signed)
Office: [phone]  /  Fax: [phone]   HPI:   Chief Complaint: OBESITY Connie Blanchard is here to discuss her progress with her obesity treatment plan. She is on the keep a food journal with 450-600 calories and 40 grams of protein at supper daily and follow the Category 3 plan and is following her eating plan approximately 75 % of the time. She states she is exercising 0 minutes 0 times per week. Addilee did well with weight loss. She continues to have a difficult time getting all of her protein in at dinner.  Her weight is 219 lb (99.3 kg) today and has had a weight loss of 1 pound over a period of 2 weeks since her last visit. She has lost 14 lbs since starting treatment with [place].  Insulin Resistance Connie Blanchard has a diagnosis of insulin resistance based on her elevated fasting insulin level >5. Although [name]'s blood glucose readings are still under good control, insulin resistance puts her at greater risk of metabolic syndrome and diabetes. She is taking metformin currently and denies nausea, vomiting, diarrhea, or polyphagia. She continues to work on diet and exercise to decrease risk of diabetes.  At risk for diabetes Connie Blanchard is at higher than average risk for developing diabetes due to her obesity and insulin resistance. She currently denies polyuria or polydipsia.  Hyperlipidemia Connie Blanchard has hyperlipidemia and has been trying to improve her cholesterol levels with intensive lifestyle modification including a low saturated fat diet, exercise and weight loss. She is not on medications and denies any chest pain, claudication or myalgias.  Depression with emotional eating behaviors Connie Blanchard is on bupropion and her blood pressure is normal. Connie Blanchard struggles with emotional eating and using food for comfort to the extent that it is negatively impacting her health. She often snacks when she is not hungry. Connie Blanchard sometimes feels she is out of control and then feels guilty that she made  poor food choices. She has been working on behavior modification techniques to help reduce her emotional eating and has been somewhat successful. She shows no sign of suicidal or homicidal ideations.  Depression screen [facility] 2/9 10/12/2017 05/25/2017  Decreased Interest 3 0  Down, Depressed, Hopeless 3 -  PHQ - 2 Score 6 0  Altered sleeping 3 -  Tired, decreased energy 3 -  Change in appetite 2 -  Feeling bad or failure about yourself  3 -  Trouble concentrating 2 -  Moving slowly or fidgety/restless 2 -  Suicidal thoughts 0 -  PHQ-9 Score 21 -  Difficult doing work/chores Somewhat difficult -    ALLERGIES: Allergies  Allergen Reactions  . Penicillins Rash    Has patient had a PCN reaction causing immediate rash, facial/tongue/throat swelling, SOB or lightheadedness with hypotension: Unknown Has patient had a PCN reaction causing severe rash involving mucus membranes or skin necrosis: Unknown Has patient had a PCN reaction that required hospitalization: No Has patient had a PCN reaction occurring within the last 10 years: No Childhood allergy  If all of the above answers are "NO", then may proceed with Cephalosporin use.   . Sulfa Antibiotics Rash    MEDICATIONS: Current Outpatient Medications on File Prior to Visit  Medication Sig Dispense Refill  . b complex vitamins tablet Take 1 tablet by mouth daily.    . cetirizine (ZYRTEC) 10 MG tablet Take 10 mg by mouth daily as needed for allergies.    Marland Kitchen diclofenac (VOLTAREN) 75 MG EC tablet Take 1 tablet (75 mg total) by  mouth 2 (two) times daily. 30 tablet 0  . esomeprazole (NEXIUM) 40 MG capsule TAKE 1 CAPSULE BY MOUTH DAILY BEFORE BREAKFAST. 90 capsule 0  . metFORMIN (GLUCOPHAGE) 500 MG tablet TAKE 1 TABLET (500 MG TOTAL) BY MOUTH 2 (TWO) TIMES DAILY WITH A MEAL. 60 tablet 0  . Multiple Vitamin (MULTIVITAMIN) tablet Take 1 tablet by mouth daily.      . traMADol (ULTRAM) 50 MG tablet Take 1 tablet (50 mg total) by mouth every 6 (six)  hours as needed for severe pain. 6 tablet 0  . Vitamin D, Cholecalciferol, 50 MCG (2000 UT) CAPS Take 2,000 Units by mouth daily.     No current facility-administered medications on file prior to visit.     PAST MEDICAL HISTORY: Past Medical History:  Diagnosis Date  . Acute bronchitis 06/24/2012  . Acute bronchitis with asthma 06/24/2012  . Allergic state 10/15/2013  . Anxiety   . Back pain 10/15/2013  . Chronic sinusitis 02/2012   current runny nose of clear drainage  . Constipation   . Dental crowns present   . Depression   . GERD (gastroesophageal reflux disease)   . H/O gestational diabetes mellitus, not currently pregnant 07/29/2011   h/o   . Headache    history of migraines  . History of blood transfusion   . Hyperglycemia 05/25/2017  . Hypertension   . Iron deficiency anemia   . Leg edema   . Obese   . PCOS (polycystic ovarian syndrome)   . Right shoulder pain 05/25/2017    PAST SURGICAL HISTORY: Past Surgical History:  Procedure Laterality Date  . ABDOMINAL HYSTERECTOMY  02/2017   anemia, fibroids  . COLONOSCOPY    . DILATION AND CURETTAGE OF UTERUS  1987  . NASAL SEPTOPLASTY W/ TURBINOPLASTY  03/23/2012   Procedure: NASAL SEPTOPLASTY WITH TURBINATE REDUCTION;  Surgeon: Jerrell Belfast, MD;  Location: [facility];  Service: ENT;  Laterality: Bilateral;  . ROBOTIC ASSISTED TOTAL HYSTERECTOMY WITH SALPINGECTOMY N/A 03/17/2017   Procedure: ROBOTIC ASSISTED TOTAL HYSTERECTOMY WITH SALPINGECTOMY;  Surgeon: Bobbye Charleston, MD;  Location: [facility];  Service: Gynecology;  Laterality: N/A;  . SINUS ENDO W/FUSION  03/23/2012   Procedure: ENDOSCOPIC SINUS SURGERY WITH FUSION NAVIGATION;  Surgeon: Jerrell Belfast, MD;  Location: [facility];  Service: ENT;  Laterality: Bilateral;  with fusion scan  . UPPER GI ENDOSCOPY    . WISDOM TOOTH EXTRACTION      SOCIAL HISTORY: Social History   Tobacco Use  . Smoking status: Former Smoker    Packs/day:  1.00    Years: 19.00    Pack years: 19.00    Last attempt to quit: 07/28/2003    Years since quitting: 15.1  . Smokeless tobacco: Never Used  Substance Use Topics  . Alcohol use: Yes    Comment: occasionally  . Drug use: No    FAMILY HISTORY: Family History  Problem Relation Age of Onset  . Diabetes Mother        type 2  . Hypertension Mother   . Depression Mother   . Mental illness Mother        bi-polar  . Pulmonary embolism Mother   . Obesity Mother   . Heart disease Maternal Grandfather   . Stroke Maternal Grandfather   . Other Father        drug addiction  . Depression Maternal Aunt 46       suicide attempt with pneumonia    ROS: Review of  Systems  Constitutional: Positive for weight loss.  Cardiovascular: Negative for chest pain and claudication.  Gastrointestinal: Negative for diarrhea, nausea and vomiting.  Genitourinary: Negative for frequency.  Musculoskeletal: Negative for myalgias.  Endo/Heme/Allergies: Negative for polydipsia.       Negative polyphagia  Psychiatric/Behavioral: Positive for depression. Negative for suicidal ideas.    PHYSICAL EXAM: Blood pressure 129/76, pulse 66, temperature 98 F (36.7 C), temperature source Oral, height 5\' 7"  (1.702 m), weight 219 lb (99.3 kg), last menstrual period 03/03/2017, SpO2 98 %. Body mass index is 34.3 kg/m. Physical Exam Vitals signs reviewed.  Constitutional:      Appearance: Normal appearance. She is obese.  Cardiovascular:     Rate and Rhythm: Normal rate.     Pulses: Normal pulses.  Pulmonary:     Effort: Pulmonary effort is normal.     Breath sounds: Normal breath sounds.  Musculoskeletal: Normal range of motion.  Skin:    General: Skin is warm and dry.  Neurological:     Mental Status: She is alert and oriented to person, place, and time.  Psychiatric:        Mood and Affect: Mood normal.        Behavior: Behavior normal.     RECENT LABS AND TESTS: BMET    Component Value Date/Time     NA 139 06/07/2018 0957   K 4.5 06/07/2018 0957   CL 101 06/07/2018 0957   CO2 22 06/07/2018 0957   GLUCOSE 83 06/07/2018 0957   GLUCOSE 80 05/25/2017 1442   BUN 15 06/07/2018 0957   CREATININE 0.92 06/07/2018 0957   CREATININE 0.99 03/31/2016 0952   CALCIUM 9.3 06/07/2018 0957   GFRNONAA 73 06/07/2018 0957   GFRNONAA 68 03/31/2016 0952   GFRAA 85 06/07/2018 0957   GFRAA 78 03/31/2016 0952   Lab Results  Component Value Date   HGBA1C 5.3 06/07/2018   HGBA1C 5.4 01/25/2018   HGBA1C 5.6 10/12/2017   HGBA1C 5.6 05/25/2017   Lab Results  Component Value Date   INSULIN 16.0 06/07/2018   INSULIN 11.2 01/25/2018   INSULIN 17.0 10/12/2017   CBC    Component Value Date/Time   WBC 6.0 10/12/2017 1122   WBC 6.9 05/25/2017 1442   RBC 4.47 10/12/2017 1122   RBC 4.41 05/25/2017 1442   HGB 13.6 10/12/2017 1122   HCT 40.9 10/12/2017 1122   PLT 242.0 05/25/2017 1442   MCV 92 10/12/2017 1122   MCH 30.4 10/12/2017 1122   MCH 29.8 03/05/2017 1100   MCHC 33.3 10/12/2017 1122   MCHC 33.2 05/25/2017 1442   RDW 13.6 10/12/2017 1122   LYMPHSABS 1.9 10/12/2017 1122   EOSABS 0.5 (H) 10/12/2017 1122   BASOSABS 0.0 10/12/2017 1122   Iron/TIBC/Ferritin/ %Sat    Component Value Date/Time   IRON 116 05/25/2017 1442   TIBC 397 10/29/2011 1552   IRONPCTSAT 7 (L) 10/29/2011 1552   Lipid Panel     Component Value Date/Time   CHOL 190 06/07/2018 0957   TRIG 80 06/07/2018 0957   HDL 56 06/07/2018 0957   CHOLHDL 4 05/25/2017 1442   VLDL 17.0 05/25/2017 1442   LDLCALC 118 (H) 06/07/2018 0957   Hepatic Function Panel     Component Value Date/Time   PROT 7.7 06/07/2018 0957   ALBUMIN 4.4 06/07/2018 0957   AST 16 06/07/2018 0957   ALT 11 06/07/2018 0957   ALKPHOS 55 06/07/2018 0957   BILITOT 0.5 06/07/2018 0957   BILIDIR 0.1 10/12/2013 1440  IBILI 0.3 10/12/2013 1440      Component Value Date/Time   TSH 2.600 10/12/2017 1122   TSH 2.32 05/25/2017 1442   TSH 3.24 05/19/2016  1550    ASSESSMENT AND PLAN: Insulin resistance - Plan: Comprehensive metabolic panel, Hemoglobin A1c, Insulin, random, VITAMIN D 25 Hydroxy (Vit-D Deficiency, Fractures)  Other hyperlipidemia - Plan: Lipid Panel With LDL/HDL Ratio  Other depression - with emotional eating - Plan: buPROPion (WELLBUTRIN SR) 150 MG 12 hr tablet  At risk for diabetes mellitus  Class 1 obesity with serious comorbidity and body mass index (BMI) of 34.0 to 34.9 in adult, unspecified obesity type  Other depression - with emotional eating  - Plan: buPROPion (WELLBUTRIN SR) 150 MG 12 hr tablet  PLAN:  Insulin Resistance Illona will continue to work on weight loss, exercise, and decreasing simple carbohydrates in her diet to help decrease the risk of diabetes. We dicussed metformin including benefits and risks. She was informed that eating too many simple carbohydrates or too many calories at one sitting increases the likelihood of GI side effects. Connie Blanchard agrees to continue taking metformin and we will check labs today. Connie Blanchard agrees to follow up with our clinic in 2 weeks as directed to monitor her progress.  Diabetes risk counseling Connie Blanchard was given extended (15 minutes) diabetes prevention counseling today. She is 48 y.o. female and has risk factors for diabetes including obesity and insulin resistance. We discussed intensive lifestyle modifications today with an emphasis on weight loss as well as increasing exercise and decreasing simple carbohydrates in her diet.  Hyperlipidemia Connie Blanchard was informed of the American Heart Association Guidelines emphasizing intensive lifestyle modifications as the first line treatment for hyperlipidemia. We discussed many lifestyle modifications today in depth, and Connie Blanchard will continue to work on decreasing saturated fats such as fatty red meat, butter and many fried foods. She will also increase vegetables and lean protein in her diet and continue to work on exercise  and weight loss efforts. We will check labs today. Connie Blanchard agrees to follow up with our clinic in 2 weeks.  Depression with Emotional Eating Behaviors We discussed behavior modification techniques today to help Connie Blanchard deal with her emotional eating and depression. Connie Blanchard agrees to continue taking bupropion 150 mg qd #30 and we will refill for 1 month. Connie Blanchard agrees to follow up with our clinic in 2 weeks.  Obesity Connie Blanchard is currently in the action stage of change. As such, her goal is to continue with weight loss efforts She has agreed to keep a food journal with 450-600 calories and 40 grams of protein at supper daily and follow the Category 3 plan Connie Blanchard has been instructed to work up to a goal of 150 minutes of combined cardio and strengthening exercise per week for weight loss and overall health benefits. We discussed the following Behavioral Modification Strategies today: work on meal planning and easy cooking plans   Connie Blanchard has agreed to follow up with our clinic in 2 weeks. She was informed of the importance of frequent follow up visits to maximize her success with intensive lifestyle modifications for her multiple health conditions.   OBESITY BEHAVIORAL INTERVENTION VISIT  Today's visit was # 20   Starting weight: 233 lbs Starting date: 10/12/17 Today's weight : 219 lbs  Today's date: 09/27/2018 Total lbs lost to date: 14    09/27/2018  Height 5\' 7"  (1.702 m)  Weight 219 lb (99.3 kg)  BMI (Calculated) 34.29  BLOOD PRESSURE - SYSTOLIC 361  BLOOD PRESSURE -  DIASTOLIC 76   Body Fat % 50.3 %  Total Body Water (lbs) 91 lbs     ASK: We discussed the diagnosis of obesity with Connie Blanchard today and Connie Blanchard agreed to give [place] permission to discuss obesity behavioral modification therapy today.  ASSESS: Connie Blanchard has the diagnosis of obesity and her BMI today is 34.29 Phillis is in the action stage of change   ADVISE: Aahna was educated on the multiple  health risks of obesity as well as the benefit of weight loss to improve her health. She was advised of the need for long term treatment and the importance of lifestyle modifications.  AGREE: Multiple dietary modification options and treatment options were discussed and  Maddi agreed to the above obesity treatment plan.  Wilhemena Durie, am acting as transcriptionist for Abby Potash, PA-C I, Abby Potash, PA-C have reviewed above note and agree with its content

## 2018-09-28 ENCOUNTER — Encounter: Payer: Self-pay | Admitting: Family Medicine

## 2018-09-28 ENCOUNTER — Other Ambulatory Visit: Payer: Self-pay | Admitting: Family Medicine

## 2018-09-28 ENCOUNTER — Encounter (INDEPENDENT_AMBULATORY_CARE_PROVIDER_SITE_OTHER): Payer: Self-pay | Admitting: Physician Assistant

## 2018-09-28 LAB — LIPID PANEL WITH LDL/HDL RATIO
CHOLESTEROL TOTAL: 172 mg/dL (ref 100–199)
HDL: 50 mg/dL (ref >39)
LDL Calculated: 105 mg/dL — ABNORMAL HIGH (ref 0–99)
LDl/HDL Ratio: 2.1 ratio (ref 0.0–3.2)
Triglycerides: 87 mg/dL (ref 0–149)
VLDL Cholesterol Cal: 17 mg/dL (ref 5–40)

## 2018-09-28 LAB — COMPREHENSIVE METABOLIC PANEL
ALBUMIN: 4.2 g/dL (ref 3.8–4.8)
ALT: 13 IU/L (ref 0–32)
AST: 13 IU/L (ref 0–40)
Albumin/Globulin Ratio: 1.4 (ref 1.2–2.2)
Alkaline Phosphatase: 54 IU/L (ref 39–117)
BUN / CREAT RATIO: 11 (ref 9–23)
BUN: 11 mg/dL (ref 6–24)
Bilirubin Total: 0.3 mg/dL (ref 0.0–1.2)
CHLORIDE: 104 mmol/L (ref 96–106)
CO2: 23 mmol/L (ref 20–29)
CREATININE: 1 mg/dL (ref 0.57–1.00)
Calcium: 9 mg/dL (ref 8.7–10.2)
GFR calc non Af Amer: 66 mL/min/{1.73_m2} (ref >59)
GFR, EST AFRICAN AMERICAN: 76 mL/min/{1.73_m2} (ref >59)
GLOBULIN, TOTAL: 3 g/dL (ref 1.5–4.5)
GLUCOSE: 88 mg/dL (ref 65–99)
Potassium: 4.7 mmol/L (ref 3.5–5.2)
SODIUM: 141 mmol/L (ref 134–144)
TOTAL PROTEIN: 7.2 g/dL (ref 6.0–8.5)

## 2018-09-28 LAB — HEMOGLOBIN A1C
Est. average glucose Bld gHb Est-mCnc: 105 mg/dL
Hgb A1c MFr Bld: 5.3 % (ref 4.8–5.6)

## 2018-09-28 LAB — VITAMIN D 25 HYDROXY (VIT D DEFICIENCY, FRACTURES): Vit D, 25-Hydroxy: 34.1 ng/mL (ref 30.0–100.0)

## 2018-09-28 LAB — INSULIN, RANDOM: INSULIN: 13.9 u[IU]/mL (ref 2.6–24.9)

## 2018-09-28 MED ORDER — SCOPOLAMINE 1 MG/3DAYS TD PT72: 1.0000 | MEDICATED_PATCH | TRANSDERMAL | 0 refills | Status: DC

## 2018-09-28 NOTE — Progress Notes (Unsigned)
[place]

## 2018-10-17 ENCOUNTER — Ambulatory Visit (INDEPENDENT_AMBULATORY_CARE_PROVIDER_SITE_OTHER): Payer: 59 | Admitting: Physician Assistant

## 2018-10-17 ENCOUNTER — Encounter (INDEPENDENT_AMBULATORY_CARE_PROVIDER_SITE_OTHER): Payer: Self-pay | Admitting: Physician Assistant

## 2018-10-17 ENCOUNTER — Encounter: Payer: Self-pay | Admitting: Family Medicine

## 2018-10-17 NOTE — Telephone Encounter (Signed)
Called pt and asked if she was still taking her Maxide and her Potassium as we no longer have it listed on her med list. She stated that she is not but with her BP being high after a hike the other day she was concerned and thought that she may need to restart. Advised her that since Dr Charlett Blake had been the prescribing physician and it was regarding her BP being high after a hike that she should contact their office. Pt verbally understood.   York Cerise, CMA

## 2018-10-18 ENCOUNTER — Ambulatory Visit (INDEPENDENT_AMBULATORY_CARE_PROVIDER_SITE_OTHER): Payer: 59 | Admitting: Family Medicine

## 2018-10-18 ENCOUNTER — Other Ambulatory Visit: Payer: Self-pay

## 2018-10-18 ENCOUNTER — Encounter (INDEPENDENT_AMBULATORY_CARE_PROVIDER_SITE_OTHER): Payer: Self-pay | Admitting: Family Medicine

## 2018-10-18 ENCOUNTER — Other Ambulatory Visit: Payer: Self-pay | Admitting: Family Medicine

## 2018-10-18 DIAGNOSIS — F418 Other specified anxiety disorders: Secondary | ICD-10-CM | POA: Diagnosis not present

## 2018-10-18 DIAGNOSIS — E669 Obesity, unspecified: Secondary | ICD-10-CM | POA: Diagnosis not present

## 2018-10-18 DIAGNOSIS — R739 Hyperglycemia, unspecified: Secondary | ICD-10-CM | POA: Diagnosis not present

## 2018-10-18 DIAGNOSIS — I10 Essential (primary) hypertension: Secondary | ICD-10-CM

## 2018-10-18 DIAGNOSIS — E782 Mixed hyperlipidemia: Secondary | ICD-10-CM

## 2018-10-18 MED ORDER — POTASSIUM CHLORIDE CRYS ER 20 MEQ PO TBCR: 20.0000 meq | EXTENDED_RELEASE_TABLET | Freq: Two times a day (BID) | ORAL | 1 refills | Status: DC

## 2018-10-18 MED ORDER — TRIAMTERENE-HCTZ 37.5-25 MG PO TABS: 1.0000 | ORAL_TABLET | Freq: Every day | ORAL | 1 refills | Status: DC

## 2018-10-18 NOTE — Assessment & Plan Note (Addendum)
Not well controlled, no changes to meds. Encouraged heart healthy diet such as the DASH diet and exercise as tolerated. Restarted on Maxzide and KCL and she will let us know if she has any concerning side effects.

## 2018-10-18 NOTE — Assessment & Plan Note (Signed)
hgba1c acceptable, minimize simple carbs. Increase exercise as tolerated.  

## 2018-10-18 NOTE — Assessment & Plan Note (Signed)
Is noting an increase in anxiety with covid and as a result she is eating worse and very stressed. She is going to change BP meds for now but if this worsens may need to discuss anxiety medications.

## 2018-10-18 NOTE — Progress Notes (Signed)
Virtual Visit via Video Note  I connected with Connie Blanchard on 10/18/18 at  2:45 PM EDT by a video enabled telemedicine application and verified that I am speaking with the correct person using two identifiers.   I discussed the limitations of evaluation and management by telemedicine and the availability of in person appointments. The patient expressed understanding and agreed to proceed. Princess Eulas Post CMA helped to get patient on the call.    Patient ID: Connie Blanchard, female    DOB: 01-Oct-1970, 48 y.o.   MRN: [id number]  No chief complaint on file.   HPI Patient is in today for follow up. No recent febrile illness or hospitalizations. She has been under great stress and has not been eating as well. Has gained weight and her blood pressure is up. She is experiencing increasing pedal edema as well. She notes anxiety and anhedonia as well. No suicidal ideation. Denies CP/palp/SOB/HA/congestion/fevers/GI or GU c/o. Taking meds as prescribed  Past Medical History:  Diagnosis Date  . Acute bronchitis 06/24/2012  . Acute bronchitis with asthma 06/24/2012  . Allergic state 10/15/2013  . Anxiety   . Back pain 10/15/2013  . Chronic sinusitis 02/2012   current runny nose of clear drainage  . Constipation   . Dental crowns present   . Depression   . GERD (gastroesophageal reflux disease)   . H/O gestational diabetes mellitus, not currently pregnant 07/29/2011   h/o   . Headache    history of migraines  . History of blood transfusion   . Hyperglycemia 05/25/2017  . Hypertension   . Iron deficiency anemia   . Leg edema   . Obese   . PCOS (polycystic ovarian syndrome)   . Right shoulder pain 05/25/2017    Past Surgical History:  Procedure Laterality Date  . ABDOMINAL HYSTERECTOMY  02/2017   anemia, fibroids  . COLONOSCOPY    . DILATION AND CURETTAGE OF UTERUS  1987  . NASAL SEPTOPLASTY W/ TURBINOPLASTY  03/23/2012   Procedure: NASAL SEPTOPLASTY WITH TURBINATE REDUCTION;   Surgeon: Jerrell Belfast, MD;  Location: [facility];  Service: ENT;  Laterality: Bilateral;  . ROBOTIC ASSISTED TOTAL HYSTERECTOMY WITH SALPINGECTOMY N/A 03/17/2017   Procedure: ROBOTIC ASSISTED TOTAL HYSTERECTOMY WITH SALPINGECTOMY;  Surgeon: Bobbye Charleston, MD;  Location: [facility];  Service: Gynecology;  Laterality: N/A;  . SINUS ENDO W/FUSION  03/23/2012   Procedure: ENDOSCOPIC SINUS SURGERY WITH FUSION NAVIGATION;  Surgeon: Jerrell Belfast, MD;  Location: [facility];  Service: ENT;  Laterality: Bilateral;  with fusion scan  . UPPER GI ENDOSCOPY    . WISDOM TOOTH EXTRACTION      Family History  Problem Relation Age of Onset  . Diabetes Mother        type 2  . Hypertension Mother   . Depression Mother   . Mental illness Mother        bi-polar  . Pulmonary embolism Mother   . Obesity Mother   . Heart disease Maternal Grandfather   . Stroke Maternal Grandfather   . Other Father        drug addiction  . Depression Maternal Aunt 46       suicide attempt with pneumonia    Social History   Socioeconomic History  . Marital status: Married    Spouse name: Dellis Filbert  . Number of children: 4  . Years of education: Not on file  . Highest education level: Not on file  Occupational History  . Occupation:  RN  Social Needs  . Financial resource strain: Not on file  . Food insecurity:    Worry: Not on file    Inability: Not on file  . Transportation needs:    Medical: Not on file    Non-medical: Not on file  Tobacco Use  . Smoking status: Former Smoker    Packs/day: 1.00    Years: 19.00    Pack years: 19.00    Last attempt to quit: 07/28/2003    Years since quitting: 15.2  . Smokeless tobacco: Never Used  Substance and Sexual Activity  . Alcohol use: Yes    Comment: occasionally  . Drug use: No  . Sexual activity: Yes    Partners: Male    Birth control/protection: None    Comment: lives with husband, stepson 1, son and daughter. no dietary  restrictions. eating heart healthy, walking more  Lifestyle  . Physical activity:    Days per week: Not on file    Minutes per session: Not on file  . Stress: Not on file  Relationships  . Social connections:    Talks on phone: Not on file    Gets together: Not on file    Attends religious service: Not on file    Active member of club or organization: Not on file    Attends meetings of clubs or organizations: Not on file    Relationship status: Not on file  . Intimate partner violence:    Fear of current or ex partner: Not on file    Emotionally abused: Not on file    Physically abused: Not on file    Forced sexual activity: Not on file  Other Topics Concern  . Not on file  Social History Narrative  . Not on file    Outpatient Medications Prior to Visit  Medication Sig Dispense Refill  . b complex vitamins tablet Take 1 tablet by mouth daily.    Marland Kitchen buPROPion (WELLBUTRIN SR) 150 MG 12 hr tablet Take 1 tablet (150 mg total) by mouth daily. 30 tablet 0  . cetirizine (ZYRTEC) 10 MG tablet Take 10 mg by mouth daily as needed for allergies.    Marland Kitchen diclofenac (VOLTAREN) 75 MG EC tablet Take 1 tablet (75 mg total) by mouth 2 (two) times daily. 30 tablet 0  . esomeprazole (NEXIUM) 40 MG capsule TAKE 1 CAPSULE BY MOUTH DAILY BEFORE BREAKFAST. 90 capsule 0  . metFORMIN (GLUCOPHAGE) 500 MG tablet TAKE 1 TABLET (500 MG TOTAL) BY MOUTH 2 (TWO) TIMES DAILY WITH A MEAL. 60 tablet 0  . Multiple Vitamin (MULTIVITAMIN) tablet Take 1 tablet by mouth daily.      Marland Kitchen scopolamine (TRANSDERM-SCOP, 1.5 MG,) 1 MG/3DAYS Place 1 patch (1.5 mg total) onto the skin every 3 (three) days. 4 patch 0  . traMADol (ULTRAM) 50 MG tablet Take 1 tablet (50 mg total) by mouth every 6 (six) hours as needed for severe pain. 6 tablet 0  . Vitamin D, Cholecalciferol, 50 MCG (2000 UT) CAPS Take 2,000 Units by mouth daily.     No facility-administered medications prior to visit.     Allergies  Allergen Reactions  .  Penicillins Rash    Has patient had a PCN reaction causing immediate rash, facial/tongue/throat swelling, SOB or lightheadedness with hypotension: Unknown Has patient had a PCN reaction causing severe rash involving mucus membranes or skin necrosis: Unknown Has patient had a PCN reaction that required hospitalization: No Has patient had a PCN reaction occurring within the  last 10 years: No Childhood allergy  If all of the above answers are "NO", then may proceed with Cephalosporin use.   . Sulfa Antibiotics Rash    Review of Systems  Constitutional: Positive for malaise/fatigue. Negative for fever.  HENT: Negative for congestion.   Eyes: Negative for blurred vision.  Respiratory: Negative for shortness of breath.   Cardiovascular: Positive for leg swelling. Negative for chest pain and palpitations.  Gastrointestinal: Negative for abdominal pain, blood in stool and nausea.  Genitourinary: Negative for dysuria and frequency.  Musculoskeletal: Negative for falls.  Skin: Negative for rash.  Neurological: Negative for dizziness, loss of consciousness and headaches.  Endo/Heme/Allergies: Negative for environmental allergies.  Psychiatric/Behavioral: Positive for depression. The patient is nervous/anxious.        Objective:    Physical Exam Constitutional:      General: She is not in acute distress.    Appearance: She is well-developed.  HENT:     Head: Normocephalic and atraumatic.     Nose: Nose normal.  Neurological:     Mental Status: She is alert and oriented to person, place, and time.  Psychiatric:        Mood and Affect: Mood normal.        Behavior: Behavior normal.     LMP 03/03/2017 (Exact Date)  Wt Readings from Last 3 Encounters:  09/27/18 219 lb (99.3 kg)  09/12/18 220 lb (99.8 kg)  08/29/18 219 lb (99.3 kg)    Diabetic Foot Exam - Simple   No data filed     Lab Results  Component Value Date   WBC 6.0 10/12/2017   HGB 13.6 10/12/2017   HCT 40.9  10/12/2017   PLT 242.0 05/25/2017   GLUCOSE 88 09/27/2018   CHOL 172 09/27/2018   TRIG 87 09/27/2018   HDL 50 09/27/2018   LDLCALC 105 (H) 09/27/2018   ALT 13 09/27/2018   AST 13 09/27/2018   NA 141 09/27/2018   K 4.7 09/27/2018   CL 104 09/27/2018   CREATININE 1.00 09/27/2018   BUN 11 09/27/2018   CO2 23 09/27/2018   TSH 2.600 10/12/2017   HGBA1C 5.3 09/27/2018    Lab Results  Component Value Date   TSH 2.600 10/12/2017   Lab Results  Component Value Date   WBC 6.0 10/12/2017   HGB 13.6 10/12/2017   HCT 40.9 10/12/2017   MCV 92 10/12/2017   PLT 242.0 05/25/2017   Lab Results  Component Value Date   NA 141 09/27/2018   K 4.7 09/27/2018   CO2 23 09/27/2018   GLUCOSE 88 09/27/2018   BUN 11 09/27/2018   CREATININE 1.00 09/27/2018   BILITOT 0.3 09/27/2018   ALKPHOS 54 09/27/2018   AST 13 09/27/2018   ALT 13 09/27/2018   PROT 7.2 09/27/2018   ALBUMIN 4.2 09/27/2018   CALCIUM 9.0 09/27/2018   GFR 64.23 05/25/2017   Lab Results  Component Value Date   CHOL 172 09/27/2018   Lab Results  Component Value Date   HDL 50 09/27/2018   Lab Results  Component Value Date   LDLCALC 105 (H) 09/27/2018   Lab Results  Component Value Date   TRIG 87 09/27/2018   Lab Results  Component Value Date   CHOLHDL 4 05/25/2017   Lab Results  Component Value Date   HGBA1C 5.3 09/27/2018       Assessment & Plan:   Problem List Items Addressed This Visit    Depression with anxiety  Is noting an increase in anxiety with covid and as a result she is eating worse and very stressed. She is going to change BP meds for now but if this worsens may need to discuss anxiety medications.       Obese    Has been following with healthy weight and wellness and had been doing well until Covid hit. Now she is eating wrong. Is using [facility] encouraged to [employer] or [employer].      Essential hypertension    Not well controlled, no changes to meds. Encouraged  heart healthy diet such as the DASH diet and exercise as tolerated. Restarted on Maxzide and KCL and she will let us know if she has any concerning side effects.       Relevant Medications   triamterene-hydrochlorothiazide (MAXZIDE-25) 37.5-25 MG tablet   Hyperlipidemia, mixed    Encouraged heart healthy diet, increase exercise, avoid trans fats, consider a krill oil cap daily      Relevant Medications   triamterene-hydrochlorothiazide (MAXZIDE-25) 37.5-25 MG tablet   Hyperglycemia    hgba1c acceptable, minimize simple carbs. Increase exercise as tolerated.          I am having Lucas Mallow. [place] start on triamterene-hydrochlorothiazide. I am also having her maintain her multivitamin, esomeprazole, b complex vitamins, cetirizine, diclofenac, traMADol, Vitamin D (Cholecalciferol), metFORMIN, buPROPion, scopolamine, and potassium chloride SA.  Meds ordered this encounter  Medications  . potassium chloride SA (KLOR-CON M20) 20 MEQ tablet    Sig: Take 1 tablet (20 mEq total) by mouth 2 (two) times daily.    Dispense:  180 tablet    Refill:  1  . triamterene-hydrochlorothiazide (MAXZIDE-25) 37.5-25 MG tablet    Sig: Take 1 tablet by mouth daily.    Dispense:  90 tablet    Refill:  1     I discussed the assessment and treatment plan with the patient. The patient was provided an opportunity to ask questions and all were answered. The patient agreed with the plan and demonstrated an understanding of the instructions.   The patient was advised to call back or seek an in-person evaluation if the symptoms worsen or if the condition fails to improve as anticipated.  I provided 22 minutes of non-face-to-face time during this encounter.   Penni Homans, MD

## 2018-10-18 NOTE — Assessment & Plan Note (Signed)
Has been following with healthy weight and wellness and had been doing well until Covid hit. Now she is eating wrong. Is using [facility] encouraged to [employer] or [employer].

## 2018-10-18 NOTE — Assessment & Plan Note (Signed)
Encouraged heart healthy diet, increase exercise, avoid trans fats, consider a krill oil cap daily 

## 2018-10-19 NOTE — Telephone Encounter (Signed)
Please advise 

## 2018-10-20 ENCOUNTER — Encounter (INDEPENDENT_AMBULATORY_CARE_PROVIDER_SITE_OTHER): Payer: Self-pay

## 2018-10-27 ENCOUNTER — Ambulatory Visit: Payer: Self-pay | Admitting: *Deleted

## 2018-10-27 NOTE — Telephone Encounter (Signed)
Patient calling with complaint of BP of 140/100 and HR of 90 right before calling the office. Pt states she feels tired and also has a headache which she states she experienced since being restarted on Maxzide which was prescribed during OV on 10/18/18. Pt states she has been taking her BP meds without missing any doses. Pt states that her BP earlier today was 144/94 two hours after taking BP medication which she takes one time daily. Pt does not report any symptoms other than headache at this time. Pt advised that if symptoms become worse or if BP increases to return call to the office. Pt verbalized understanding. Pt can be contacted at [phone] if appt is required or if PCP recommendations could be given without office visit. Email address in chart verified.   Reason for Disposition . Systolic BP  >= 643 OR Diastolic >= 329  Answer Assessment - Initial Assessment Questions 1. BLOOD PRESSURE: "What is the blood pressure?" "Did you take at least two measurements 5 minutes apart?"     140/00 2. ONSET: "When did you take your blood pressure?"     Right before calling the office 3. HOW: "How did you obtain the blood pressure?" (e.g., visiting nurse, automatic home BP monitor)     arm 4. HISTORY: "Do you have a history of high blood pressure?"     yes 5. MEDICATIONS: "Are you taking any medications for blood pressure?" "Have you missed any doses recently?"     maxide 37.5/12.5 6. OTHER SYMPTOMS: "Do you have any symptoms?" (e.g., headache, chest pain, blurred vision, difficulty breathing, weakness)     Feeling tired and feels like heart is pumping 7. PREGNANCY: "Is there any chance you are pregnant?" "When was your last menstrual period?"     No had a hysterectomy  Protocols used: HIGH BLOOD PRESSURE-A-AH

## 2018-10-29 ENCOUNTER — Other Ambulatory Visit: Payer: Self-pay | Admitting: Family Medicine

## 2018-10-29 MED ORDER — METOPROLOL SUCCINATE ER 25 MG PO TB24: 25.0000 mg | ORAL_TABLET | Freq: Every day | ORAL | 1 refills | Status: DC

## 2018-10-29 NOTE — Telephone Encounter (Signed)
Have her stop the Maxzide and start Metoprolol XR 25 mg daily, I sent it in and have her check her BP and pulse and let us know what they run this next week.

## 2018-11-01 NOTE — Telephone Encounter (Signed)
Left message for patient to call the office back   Nurse triage may handle  

## 2018-11-15 ENCOUNTER — Other Ambulatory Visit: Payer: Self-pay | Admitting: Family Medicine

## 2018-12-03 ENCOUNTER — Other Ambulatory Visit: Payer: Self-pay | Admitting: Family Medicine

## 2018-12-09 ENCOUNTER — Other Ambulatory Visit: Payer: Self-pay | Admitting: Family Medicine

## 2018-12-09 ENCOUNTER — Other Ambulatory Visit (HOSPITAL_COMMUNITY): Payer: Self-pay | Admitting: Gastroenterology

## 2018-12-09 ENCOUNTER — Other Ambulatory Visit: Payer: Self-pay | Admitting: Gastroenterology

## 2018-12-09 DIAGNOSIS — R1011 Right upper quadrant pain: Secondary | ICD-10-CM

## 2019-01-19 ENCOUNTER — Other Ambulatory Visit (HOSPITAL_COMMUNITY): Payer: 59

## 2019-01-19 ENCOUNTER — Ambulatory Visit (HOSPITAL_COMMUNITY): Payer: 59

## 2019-01-25 ENCOUNTER — Encounter (HOSPITAL_COMMUNITY)
Admission: RE | Admit: 2019-01-25 | Discharge: 2019-01-25 | Disposition: A | Discharge status: Home / Self Care | Payer: 59 | Source: Ambulatory Visit | Attending: Gastroenterology | Admitting: Gastroenterology

## 2019-01-25 ENCOUNTER — Ambulatory Visit (HOSPITAL_COMMUNITY)
Admission: RE | Admit: 2019-01-25 | Discharge: 2019-01-25 | Disposition: A | Discharge status: Home / Self Care | Payer: 59 | Source: Ambulatory Visit | Attending: Gastroenterology | Admitting: Gastroenterology

## 2019-01-25 DIAGNOSIS — R1011 Right upper quadrant pain: Secondary | ICD-10-CM | POA: Diagnosis present

## 2019-01-25 MED ORDER — TECHNETIUM TC 99M MEBROFENIN IV KIT
5.0000 | PACK | Freq: Once | INTRAVENOUS | Status: AC | PRN
  Administered 2019-01-25: 5 via INTRAVENOUS

## 2019-03-02 ENCOUNTER — Telehealth (INDEPENDENT_AMBULATORY_CARE_PROVIDER_SITE_OTHER): Payer: 59 | Admitting: Family Medicine

## 2019-03-02 ENCOUNTER — Encounter (INDEPENDENT_AMBULATORY_CARE_PROVIDER_SITE_OTHER): Payer: Self-pay | Admitting: Family Medicine

## 2019-03-02 ENCOUNTER — Other Ambulatory Visit: Payer: Self-pay

## 2019-03-02 DIAGNOSIS — Z6836 Body mass index (BMI) 36.0-36.9, adult: Secondary | ICD-10-CM | POA: Diagnosis not present

## 2019-03-02 DIAGNOSIS — E8881 Metabolic syndrome: Secondary | ICD-10-CM | POA: Diagnosis not present

## 2019-03-02 DIAGNOSIS — I10 Essential (primary) hypertension: Secondary | ICD-10-CM | POA: Diagnosis not present

## 2019-03-02 MED ORDER — METFORMIN HCL 500 MG PO TABS: 500.0000 mg | ORAL_TABLET | Freq: Two times a day (BID) | ORAL | 0 refills | Status: DC

## 2019-03-02 NOTE — Progress Notes (Signed)
Office: [phone]  /  Fax: [phone] TeleHealth Visit:  Connie Blanchard has verbally consented to this TeleHealth visit today. The patient is located at home, the provider is located at the [facility] office. The participants in this visit include the listed provider and patient. The visit was conducted today via FaceTime.  HPI:   Chief Complaint: OBESITY Connie Blanchard is here to discuss her progress with her obesity treatment plan. She is on the Category 3 plan and is following her eating plan approximately 80% for 4 days. She states she is walking 30 minutes 2 times per week. Connie Blanchard states she has gained approximately 20 lbs since the start of the pandemic and states she was eating as much as possible for comfort. She voices for months she has struggled with anxiety secondary to the pandemic and has realized this and is ready to recommit. She reports her weight to be 238 lbs. We were unable to weigh the patient today for this TeleHealth visit. She feels as if she has gained weight since her last visit. She has lost 14 lbs since starting treatment with [place].  Insulin Resistance Connie Blanchard has a diagnosis of insulin resistance based on her elevated fasting insulin level >5. Although Connie Blanchard's blood glucose readings are still under good control, insulin resistance puts her at greater risk of metabolic syndrome and diabetes. She is taking metformin currently with no GI side effects. She continues to work on diet and exercise to decrease risk of diabetes. She admits to carb cravings.  Hypertension Connie Blanchard is a 48 y.o. female with hypertension.  [name] denies chest pain, chest pressure, or headache. She is working weight loss to help control her blood pressure with the goal of decreasing her risk of heart attack and stroke. [name]'s blood pressure was initially elevated. She is back on blood pressure medication since the start of the pandemic.  ASSESSMENT AND  PLAN:  Essential hypertension  Insulin resistance - Plan: metFORMIN (GLUCOPHAGE) 500 MG tablet  Class 2 severe obesity with serious comorbidity and body mass index (BMI) of 36.0 to 36.9 in adult, unspecified obesity type ([place])  PLAN:  Insulin Resistance Connie Blanchard will continue to work on weight loss, exercise, and decreasing simple carbohydrates in her diet to help decrease the risk of diabetes. We dicussed metformin including benefits and risks. She was informed that eating too many simple carbohydrates or too many calories at one sitting increases the likelihood of GI side effects. Connie Blanchard was given a refill on her metformin 500 mg PO BID #60 with 0 refills. She agrees to follow-up with our clinic in 2 weeks.  Hypertension We discussed sodium restriction, working on healthy weight loss, and a regular exercise program as the means to achieve improved blood pressure control. Connie Blanchard agreed with this plan and agreed to follow-up as directed. We will continue to monitor her blood pressure as well as her progress with the above lifestyle modifications. She will continue her medications as prescribed and we will re-evaluate at her first in-office appointment. She will watch for signs of hypotension as she continues her lifestyle modifications.  Obesity Connie Blanchard is currently in the action stage of change. As such, her goal is to continue with weight loss efforts. She has agreed to follow the Category 3 plan. Connie Blanchard has been instructed to work up to a goal of 150 minutes of combined cardio and strengthening exercise per week for weight loss and overall health benefits. We discussed the following Behavioral Modification  Strategies today: increasing lean protein intake, increasing vegetables, work on meal planning and easy cooking plans, keeping healthy foods in the home, and planning for success.  Connie Blanchard has agreed to follow-up with our clinic in 2 weeks. She was informed of the importance of  frequent follow-up visits to maximize her success with intensive lifestyle modifications for her multiple health conditions.  ALLERGIES: Allergies  Allergen Reactions  . Penicillins Rash    Has patient had a PCN reaction causing immediate rash, facial/tongue/throat swelling, SOB or lightheadedness with hypotension: Unknown Has patient had a PCN reaction causing severe rash involving mucus membranes or skin necrosis: Unknown Has patient had a PCN reaction that required hospitalization: No Has patient had a PCN reaction occurring within the last 10 years: No Childhood allergy  If all of the above answers are "NO", then may proceed with Cephalosporin use.   . Sulfa Antibiotics Rash    MEDICATIONS: Current Outpatient Medications on File Prior to Visit  Medication Sig Dispense Refill  . b complex vitamins tablet Take 1 tablet by mouth daily.    . cetirizine (ZYRTEC) 10 MG tablet Take 10 mg by mouth daily as needed for allergies.    Marland Kitchen esomeprazole (NEXIUM) 40 MG capsule TAKE 1 CAPSULE BY MOUTH DAILY BEFORE BREAKFAST. 90 capsule 0  . KLOR-CON M20 20 MEQ tablet TAKE 1 TABLET (20 MEQ TOTAL) BY MOUTH 3 (THREE) TIMES DAILY. 90 tablet 0  . Multiple Vitamin (MULTIVITAMIN) tablet Take 1 tablet by mouth daily.      Marland Kitchen scopolamine (TRANSDERM-SCOP, 1.5 MG,) 1 MG/3DAYS Place 1 patch (1.5 mg total) onto the skin every 3 (three) days. 4 patch 0  . triamterene-hydrochlorothiazide (MAXZIDE-25) 37.5-25 MG tablet Take 1 tablet by mouth daily.    . Vitamin D, Cholecalciferol, 50 MCG (2000 UT) CAPS Take 2,000 Units by mouth daily.    Marland Kitchen buPROPion (WELLBUTRIN SR) 150 MG 12 hr tablet Take 1 tablet (150 mg total) by mouth daily. (Patient not taking: Reported on 03/02/2019) 30 tablet 0  . diclofenac (VOLTAREN) 75 MG EC tablet Take 1 tablet (75 mg total) by mouth 2 (two) times daily. (Patient not taking: Reported on 03/02/2019) 30 tablet 0  . metoprolol succinate (TOPROL-XL) 25 MG 24 hr tablet TAKE 1 TABLET BY MOUTH EVERY  DAY (Patient not taking: Reported on 03/02/2019) 90 tablet 1  . traMADol (ULTRAM) 50 MG tablet Take 1 tablet (50 mg total) by mouth every 6 (six) hours as needed for severe pain. (Patient not taking: Reported on 03/02/2019) 6 tablet 0   No current facility-administered medications on file prior to visit.     PAST MEDICAL HISTORY: Past Medical History:  Diagnosis Date  . Acute bronchitis 06/24/2012  . Acute bronchitis with asthma 06/24/2012  . Allergic state 10/15/2013  . Anxiety   . Back pain 10/15/2013  . Chronic sinusitis 02/2012   current runny nose of clear drainage  . Constipation   . Dental crowns present   . Depression   . GERD (gastroesophageal reflux disease)   . H/O gestational diabetes mellitus, not currently pregnant 07/29/2011   h/o   . Headache    history of migraines  . History of blood transfusion   . Hyperglycemia 05/25/2017  . Hypertension   . Iron deficiency anemia   . Leg edema   . Obese   . PCOS (polycystic ovarian syndrome)   . Right shoulder pain 05/25/2017    PAST SURGICAL HISTORY: Past Surgical History:  Procedure Laterality Date  . ABDOMINAL  HYSTERECTOMY  02/2017   anemia, fibroids  . COLONOSCOPY    . DILATION AND CURETTAGE OF UTERUS  1987  . NASAL SEPTOPLASTY W/ TURBINOPLASTY  03/23/2012   Procedure: NASAL SEPTOPLASTY WITH TURBINATE REDUCTION;  Surgeon: Jerrell Belfast, MD;  Location: [facility];  Service: ENT;  Laterality: Bilateral;  . ROBOTIC ASSISTED TOTAL HYSTERECTOMY WITH SALPINGECTOMY N/A 03/17/2017   Procedure: ROBOTIC ASSISTED TOTAL HYSTERECTOMY WITH SALPINGECTOMY;  Surgeon: Bobbye Charleston, MD;  Location: [facility];  Service: Gynecology;  Laterality: N/A;  . SINUS ENDO W/FUSION  03/23/2012   Procedure: ENDOSCOPIC SINUS SURGERY WITH FUSION NAVIGATION;  Surgeon: Jerrell Belfast, MD;  Location: [facility];  Service: ENT;  Laterality: Bilateral;  with fusion scan  . UPPER GI ENDOSCOPY    . WISDOM TOOTH EXTRACTION       SOCIAL HISTORY: Social History   Tobacco Use  . Smoking status: Former Smoker    Packs/day: 1.00    Years: 19.00    Pack years: 19.00    Quit date: 07/28/2003    Years since quitting: 15.6  . Smokeless tobacco: Never Used  Substance Use Topics  . Alcohol use: Yes    Comment: occasionally  . Drug use: No    FAMILY HISTORY: Family History  Problem Relation Age of Onset  . Diabetes Mother        type 2  . Hypertension Mother   . Depression Mother   . Mental illness Mother        bi-polar  . Pulmonary embolism Mother   . Obesity Mother   . Heart disease Maternal Grandfather   . Stroke Maternal Grandfather   . Other Father        drug addiction  . Depression Maternal Aunt 46       suicide attempt with pneumonia   ROS: Review of Systems  Cardiovascular: Negative for chest pain.       Negative for chest pressure.  Neurological: Negative for headaches.   PHYSICAL EXAM: Pt in no acute distress  RECENT LABS AND TESTS: BMET    Component Value Date/Time   NA 141 09/27/2018 1108   K 4.7 09/27/2018 1108   CL 104 09/27/2018 1108   CO2 23 09/27/2018 1108   GLUCOSE 88 09/27/2018 1108   GLUCOSE 80 05/25/2017 1442   BUN 11 09/27/2018 1108   CREATININE 1.00 09/27/2018 1108   CREATININE 0.99 03/31/2016 0952   CALCIUM 9.0 09/27/2018 1108   GFRNONAA 66 09/27/2018 1108   GFRNONAA 68 03/31/2016 0952   GFRAA 76 09/27/2018 1108   GFRAA 78 03/31/2016 0952   Lab Results  Component Value Date   HGBA1C 5.3 09/27/2018   HGBA1C 5.3 06/07/2018   HGBA1C 5.4 01/25/2018   HGBA1C 5.6 10/12/2017   HGBA1C 5.6 05/25/2017   Lab Results  Component Value Date   INSULIN 13.9 09/27/2018   INSULIN 16.0 06/07/2018   INSULIN 11.2 01/25/2018   INSULIN 17.0 10/12/2017   CBC    Component Value Date/Time   WBC 6.0 10/12/2017 1122   WBC 6.9 05/25/2017 1442   RBC 4.47 10/12/2017 1122   RBC 4.41 05/25/2017 1442   HGB 13.6 10/12/2017 1122   HCT 40.9 10/12/2017 1122   PLT 242.0 05/25/2017  1442   MCV 92 10/12/2017 1122   MCH 30.4 10/12/2017 1122   MCH 29.8 03/05/2017 1100   MCHC 33.3 10/12/2017 1122   MCHC 33.2 05/25/2017 1442   RDW 13.6 10/12/2017 1122   LYMPHSABS 1.9 10/12/2017 1122  EOSABS 0.5 (H) 10/12/2017 1122   BASOSABS 0.0 10/12/2017 1122   Iron/TIBC/Ferritin/ %Sat    Component Value Date/Time   IRON 116 05/25/2017 1442   TIBC 397 10/29/2011 1552   IRONPCTSAT 7 (L) 10/29/2011 1552   Lipid Panel     Component Value Date/Time   CHOL 172 09/27/2018 1108   TRIG 87 09/27/2018 1108   HDL 50 09/27/2018 1108   CHOLHDL 4 05/25/2017 1442   VLDL 17.0 05/25/2017 1442   LDLCALC 105 (H) 09/27/2018 1108   Hepatic Function Panel     Component Value Date/Time   PROT 7.2 09/27/2018 1108   ALBUMIN 4.2 09/27/2018 1108   AST 13 09/27/2018 1108   ALT 13 09/27/2018 1108   ALKPHOS 54 09/27/2018 1108   BILITOT 0.3 09/27/2018 1108   BILIDIR 0.1 10/12/2013 1440   IBILI 0.3 10/12/2013 1440      Component Value Date/Time   TSH 2.600 10/12/2017 1122   TSH 2.32 05/25/2017 1442   TSH 3.24 05/19/2016 1550   Results for AIRYN, ELLZEY (MRN [id number]) as of 03/02/2019 10:35  Ref. Range 09/27/2018 11:08  Vitamin D, 25-Hydroxy Latest Ref Range: 30.0 - 100.0 ng/mL 34.1    I, Michaelene Song, am acting as Location manager for Ilene Qua, MD I have reviewed the above documentation for accuracy and completeness, and I agree with the above. - Ilene Qua, MD

## 2019-03-16 ENCOUNTER — Other Ambulatory Visit: Payer: Self-pay

## 2019-03-16 ENCOUNTER — Telehealth (INDEPENDENT_AMBULATORY_CARE_PROVIDER_SITE_OTHER): Payer: 59 | Admitting: Family Medicine

## 2019-03-16 VITALS — BP 108/70 | HR 67 | Temp 98.0°F | Ht 67.0 in | Wt 232.0 lb

## 2019-03-16 DIAGNOSIS — E8881 Metabolic syndrome: Secondary | ICD-10-CM | POA: Diagnosis not present

## 2019-03-16 DIAGNOSIS — Z6836 Body mass index (BMI) 36.0-36.9, adult: Secondary | ICD-10-CM

## 2019-03-16 DIAGNOSIS — I10 Essential (primary) hypertension: Secondary | ICD-10-CM

## 2019-03-16 DIAGNOSIS — E559 Vitamin D deficiency, unspecified: Secondary | ICD-10-CM | POA: Diagnosis not present

## 2019-03-16 DIAGNOSIS — Z9189 Other specified personal risk factors, not elsewhere classified: Secondary | ICD-10-CM | POA: Diagnosis not present

## 2019-03-21 NOTE — Progress Notes (Signed)
Office: [phone]  /  Fax: [phone]   HPI:   Chief Complaint: OBESITY Connie Blanchard is here to discuss her progress with her obesity treatment plan. She is on the Category 3 plan and is following her eating plan approximately 80 % of the time. She states she is walking for 30 minutes 1-2 times per week. Connie Blanchard is doing a little bit of walking and has been more on the plan. She is doing more meal planning and making sure food is in the house. Her home scale reports a weight of 236 lbs. She is working from home.  Her weight is 232 lb (105.2 kg) today and has gained 13 lbs since her last visit. She has lost 1 lb since starting treatment with [place].  Insulin Resistance Connie Blanchard has a diagnosis of insulin resistance based on her elevated fasting insulin level >5. Last insulin level was 13.9. Although Connie Blanchard's blood glucose readings are still under good control, insulin resistance puts her at greater risk of metabolic syndrome and diabetes. She is taking metformin currently and continues to work on diet and exercise to decrease risk of diabetes.  At risk for diabetes Connie Blanchard is at higher than average risk for developing diabetes due to her obesity and insulin resistance. She currently denies polyuria or polydipsia.  Vitamin D Deficiency Connie Blanchard has a diagnosis of vitamin D deficiency. She is currently taking OTC Vit D. Last Vit D level was 34.1. She denies nausea, vomiting or muscle weakness.  Hypertension Connie Blanchard is a 48 y.o. female with hypertension. Connie Blanchard's blood pressure is well controlled. She denies chest pain, chest pressure, or headaches. She is working on weight loss to help control her blood pressure with the goal of decreasing her risk of heart attack and stroke.   ASSESSMENT AND PLAN:  Insulin resistance - Plan: Hemoglobin A1c, Insulin, random  Vitamin D deficiency - Plan: Vitamin D 1,25 dihydroxy  Essential hypertension - Plan: Comprehensive metabolic panel,  Lipid panel  At risk for diabetes mellitus  Class 2 severe obesity with serious comorbidity and body mass index (BMI) of 36.0 to 36.9 in adult, unspecified obesity type ([place])  PLAN:  Insulin Resistance Connie Blanchard will continue to work on weight loss, exercise, and decreasing simple carbohydrates in her diet to help decrease the risk of diabetes. We dicussed metformin including benefits and risks. She was informed that eating too many simple carbohydrates or too many calories at one sitting increases the likelihood of GI side effects. Connie Blanchard agrees to continue taking metformin, and we will check Hgb A1c, insulin, and FLP today. [name] agrees to follow up with our clinic in 2 weeks as directed to monitor her progress.  Diabetes risk counseling Connie Blanchard was given extended (15 minutes) diabetes prevention counseling today. She is 48 y.o. female and has risk factors for diabetes including obesity and insulin resistance. We discussed intensive lifestyle modifications today with an emphasis on weight loss as well as increasing exercise and decreasing simple carbohydrates in her diet.  Vitamin D Deficiency Connie Blanchard was informed that low vitamin D levels contributes to fatigue and are associated with obesity, breast, and colon cancer. Connie Blanchard agrees to continue taking OTC Vit D 2,000 IU daily and will follow up for routine testing of vitamin D, at least 2-3 times per year. She was informed of the risk of over-replacement of vitamin D and agrees to not increase her dose unless she discusses this with [place] first. We will check Vit D level today. Connie Blanchard agrees to follow up  with our clinic in 2 weeks.  Hypertension We discussed sodium restriction, working on healthy weight loss, and a regular exercise program as the means to achieve improved blood pressure control. Connie Blanchard agreed with this plan and agreed to follow up as directed. We will continue to monitor her blood pressure as well as her progress with  the above lifestyle modifications. Connie Blanchard agrees to continue her medications and will watch for signs of hypotension as she continues her lifestyle modifications. We will check CMP today. Connie Blanchard agrees to follow up with our clinic in 2 weeks.  Obesity Connie Blanchard is currently in the action stage of change. As such, her goal is to continue with weight loss efforts She has agreed to follow the Category 3 plan Connie Blanchard has been instructed to work up to a goal of 150 minutes of combined cardio and strengthening exercise per week for weight loss and overall health benefits. We discussed the following Behavioral Modification Strategies today: increasing lean protein intake, increasing vegetables and work on meal planning and easy cooking plans, keeping healthy foods in the home, and planning for success   Connie Blanchard has agreed to follow up with our clinic in 2 weeks. She was informed of the importance of frequent follow up visits to maximize her success with intensive lifestyle modifications for her multiple health conditions.  ALLERGIES: Allergies  Allergen Reactions   Penicillins Rash    Has patient had a PCN reaction causing immediate rash, facial/tongue/throat swelling, SOB or lightheadedness with hypotension: Unknown Has patient had a PCN reaction causing severe rash involving mucus membranes or skin necrosis: Unknown Has patient had a PCN reaction that required hospitalization: No Has patient had a PCN reaction occurring within the last 10 years: No Childhood allergy  If all of the above answers are "NO", then may proceed with Cephalosporin use.    Sulfa Antibiotics Rash    MEDICATIONS: Current Outpatient Medications on File Prior to Visit  Medication Sig Dispense Refill   b complex vitamins tablet Take 1 tablet by mouth daily.     cetirizine (ZYRTEC) 10 MG tablet Take 10 mg by mouth daily as needed for allergies.     esomeprazole (NEXIUM) 40 MG capsule TAKE 1 CAPSULE BY MOUTH  DAILY BEFORE BREAKFAST. 90 capsule 0   KLOR-CON M20 20 MEQ tablet TAKE 1 TABLET (20 MEQ TOTAL) BY MOUTH 3 (THREE) TIMES DAILY. 90 tablet 0   metFORMIN (GLUCOPHAGE) 500 MG tablet Take 1 tablet (500 mg total) by mouth 2 (two) times daily with a meal. 60 tablet 0   Multiple Vitamin (MULTIVITAMIN) tablet Take 1 tablet by mouth daily.       triamterene-hydrochlorothiazide (MAXZIDE-25) 37.5-25 MG tablet Take 1 tablet by mouth daily.     Vitamin D, Cholecalciferol, 50 MCG (2000 UT) CAPS Take 2,000 Units by mouth daily.     No current facility-administered medications on file prior to visit.     PAST MEDICAL HISTORY: Past Medical History:  Diagnosis Date   Acute bronchitis 06/24/2012   Acute bronchitis with asthma 06/24/2012   Allergic state 10/15/2013   Anxiety    Back pain 10/15/2013   Chronic sinusitis 02/2012   current runny nose of clear drainage   Constipation    Dental crowns present    Depression    GERD (gastroesophageal reflux disease)    H/O gestational diabetes mellitus, not currently pregnant 07/29/2011   h/o    Headache    history of migraines   History of blood transfusion  Hyperglycemia 05/25/2017   Hypertension    Iron deficiency anemia    Leg edema    Obese    PCOS (polycystic ovarian syndrome)    Right shoulder pain 05/25/2017    PAST SURGICAL HISTORY: Past Surgical History:  Procedure Laterality Date   ABDOMINAL HYSTERECTOMY  02/2017   anemia, fibroids   COLONOSCOPY     DILATION AND CURETTAGE OF UTERUS  1987   NASAL SEPTOPLASTY W/ TURBINOPLASTY  03/23/2012   Procedure: NASAL SEPTOPLASTY WITH TURBINATE REDUCTION;  Surgeon: Jerrell Belfast, MD;  Location: [facility];  Service: ENT;  Laterality: Bilateral;   ROBOTIC ASSISTED TOTAL HYSTERECTOMY WITH SALPINGECTOMY N/A 03/17/2017   Procedure: ROBOTIC ASSISTED TOTAL HYSTERECTOMY WITH SALPINGECTOMY;  Surgeon: Bobbye Charleston, MD;  Location: [facility];  Service: Gynecology;   Laterality: N/A;   SINUS ENDO W/FUSION  03/23/2012   Procedure: ENDOSCOPIC SINUS SURGERY WITH FUSION NAVIGATION;  Surgeon: Jerrell Belfast, MD;  Location: [facility];  Service: ENT;  Laterality: Bilateral;  with fusion scan   UPPER GI ENDOSCOPY     WISDOM TOOTH EXTRACTION      SOCIAL HISTORY: Social History   Tobacco Use   Smoking status: Former Smoker    Packs/day: 1.00    Years: 19.00    Pack years: 19.00    Quit date: 07/28/2003    Years since quitting: 15.6   Smokeless tobacco: Never Used  Substance Use Topics   Alcohol use: Yes    Comment: occasionally   Drug use: No    FAMILY HISTORY: Family History  Problem Relation Age of Onset   Diabetes Mother        type 2   Hypertension Mother    Depression Mother    Mental illness Mother        bi-polar   Pulmonary embolism Mother    Obesity Mother    Heart disease Maternal Grandfather    Stroke Maternal Grandfather    Other Father        drug addiction   Depression Maternal Aunt 42       suicide attempt with pneumonia    ROS: Review of Systems  Constitutional: Negative for weight loss.  Cardiovascular: Negative for chest pain.       Negative chest pressure  Gastrointestinal: Negative for nausea and vomiting.  Genitourinary: Negative for frequency.  Musculoskeletal:       Negative muscle weakness  Neurological: Negative for headaches.  Endo/Heme/Allergies: Negative for polydipsia.    PHYSICAL EXAM: Blood pressure 108/70, pulse 67, temperature 98 F (36.7 C), temperature source Oral, height 5\' 7"  (1.702 m), weight 232 lb (105.2 kg), last menstrual period 03/03/2017, SpO2 97 %. Body mass index is 36.34 kg/m. Physical Exam Vitals signs reviewed.  Constitutional:      Appearance: Normal appearance. She is obese.  Cardiovascular:     Rate and Rhythm: Normal rate.     Pulses: Normal pulses.  Pulmonary:     Effort: Pulmonary effort is normal.     Breath sounds: Normal breath  sounds.  Musculoskeletal: Normal range of motion.  Skin:    General: Skin is warm and dry.  Neurological:     Mental Status: She is alert and oriented to person, place, and time.  Psychiatric:        Mood and Affect: Mood normal.        Behavior: Behavior normal.     RECENT LABS AND TESTS: BMET    Component Value Date/Time   NA 139  03/16/2019 1112   K 4.3 03/16/2019 1112   CL 99 03/16/2019 1112   CO2 24 03/16/2019 1112   GLUCOSE 92 03/16/2019 1112   GLUCOSE 80 05/25/2017 1442   BUN 16 03/16/2019 1112   CREATININE 0.94 03/16/2019 1112   CREATININE 0.99 03/31/2016 0952   CALCIUM 9.5 03/16/2019 1112   GFRNONAA 71 03/16/2019 1112   GFRNONAA 68 03/31/2016 0952   GFRAA 82 03/16/2019 1112   GFRAA 78 03/31/2016 0952   Lab Results  Component Value Date   HGBA1C 5.3 03/16/2019   HGBA1C 5.3 09/27/2018   HGBA1C 5.3 06/07/2018   HGBA1C 5.4 01/25/2018   HGBA1C 5.6 10/12/2017   Lab Results  Component Value Date   INSULIN 23.4 03/16/2019   INSULIN 13.9 09/27/2018   INSULIN 16.0 06/07/2018   INSULIN 11.2 01/25/2018   INSULIN 17.0 10/12/2017   CBC    Component Value Date/Time   WBC 6.0 10/12/2017 1122   WBC 6.9 05/25/2017 1442   RBC 4.47 10/12/2017 1122   RBC 4.41 05/25/2017 1442   HGB 13.6 10/12/2017 1122   HCT 40.9 10/12/2017 1122   PLT 242.0 05/25/2017 1442   MCV 92 10/12/2017 1122   MCH 30.4 10/12/2017 1122   MCH 29.8 03/05/2017 1100   MCHC 33.3 10/12/2017 1122   MCHC 33.2 05/25/2017 1442   RDW 13.6 10/12/2017 1122   LYMPHSABS 1.9 10/12/2017 1122   EOSABS 0.5 (H) 10/12/2017 1122   BASOSABS 0.0 10/12/2017 1122   Iron/TIBC/Ferritin/ %Sat    Component Value Date/Time   IRON 116 05/25/2017 1442   TIBC 397 10/29/2011 1552   IRONPCTSAT 7 (L) 10/29/2011 1552   Lipid Panel     Component Value Date/Time   CHOL 205 (H) 03/16/2019 1112   TRIG 124 03/16/2019 1112   HDL 56 03/16/2019 1112   CHOLHDL 3.7 03/16/2019 1112   CHOLHDL 4 05/25/2017 1442   VLDL 17.0  05/25/2017 1442   LDLCALC 124 (H) 03/16/2019 1112   Hepatic Function Panel     Component Value Date/Time   PROT 7.9 03/16/2019 1112   ALBUMIN 4.4 03/16/2019 1112   AST 17 03/16/2019 1112   ALT 22 03/16/2019 1112   ALKPHOS 55 03/16/2019 1112   BILITOT 0.4 03/16/2019 1112   BILIDIR 0.1 10/12/2013 1440   IBILI 0.3 10/12/2013 1440      Component Value Date/Time   TSH 2.600 10/12/2017 1122   TSH 2.32 05/25/2017 1442   TSH 3.24 05/19/2016 1550      OBESITY BEHAVIORAL INTERVENTION VISIT  Today's visit was # 22   Starting weight: 233 lbs Starting date: 10/12/17 Today's weight : 232 lbs Today's date: 03/16/2019 Total lbs lost to date: 1    ASK: We discussed the diagnosis of obesity with [name] today and Joelene Millin agreed to give [place] permission to discuss obesity behavioral modification therapy today.  ASSESS: Courtlyn has the diagnosis of obesity and her BMI today is 36.33 Deaira is in the action stage of change   ADVISE: Clinique was educated on the multiple health risks of obesity as well as the benefit of weight loss to improve her health. She was advised of the need for long term treatment and the importance of lifestyle modifications to improve her current health and to decrease her risk of future health problems.  AGREE: Multiple dietary modification options and treatment options were discussed and  Serina agreed to follow the recommendations documented in the above note.  ARRANGE: Nyah was educated on the importance of  frequent visits to treat obesity as outlined per CMS and USPSTF guidelines and agreed to schedule her next follow up appointment today.  I, Trixie Dredge, am acting as transcriptionist for Ilene Qua, MD  I have reviewed the above documentation for accuracy and completeness, and I agree with the above. - Ilene Qua, MD

## 2019-03-26 LAB — LIPID PANEL
Chol/HDL Ratio: 3.7 ratio (ref 0.0–4.4)
Cholesterol, Total: 205 mg/dL — ABNORMAL HIGH (ref 100–199)
HDL: 56 mg/dL (ref >39)
LDL Calculated: 124 mg/dL — ABNORMAL HIGH (ref 0–99)
Triglycerides: 124 mg/dL (ref 0–149)
VLDL Cholesterol Cal: 25 mg/dL (ref 5–40)

## 2019-03-26 LAB — COMPREHENSIVE METABOLIC PANEL
ALT: 22 IU/L (ref 0–32)
AST: 17 IU/L (ref 0–40)
Albumin/Globulin Ratio: 1.3 (ref 1.2–2.2)
Albumin: 4.4 g/dL (ref 3.8–4.8)
Alkaline Phosphatase: 55 IU/L (ref 39–117)
BUN/Creatinine Ratio: 17 (ref 9–23)
BUN: 16 mg/dL (ref 6–24)
Bilirubin Total: 0.4 mg/dL (ref 0.0–1.2)
CO2: 24 mmol/L (ref 20–29)
Calcium: 9.5 mg/dL (ref 8.7–10.2)
Chloride: 99 mmol/L (ref 96–106)
Creatinine, Ser: 0.94 mg/dL (ref 0.57–1.00)
GFR calc Af Amer: 82 mL/min/{1.73_m2} (ref >59)
GFR calc non Af Amer: 71 mL/min/{1.73_m2} (ref >59)
Globulin, Total: 3.5 g/dL (ref 1.5–4.5)
Glucose: 92 mg/dL (ref 65–99)
Potassium: 4.3 mmol/L (ref 3.5–5.2)
Sodium: 139 mmol/L (ref 134–144)
Total Protein: 7.9 g/dL (ref 6.0–8.5)

## 2019-03-26 LAB — HEMOGLOBIN A1C
Est. average glucose Bld gHb Est-mCnc: 105 mg/dL
Hgb A1c MFr Bld: 5.3 % (ref 4.8–5.6)

## 2019-03-26 LAB — INSULIN, RANDOM: INSULIN: 23.4 u[IU]/mL (ref 2.6–24.9)

## 2019-03-26 LAB — VITAMIN D 1,25 DIHYDROXY
Vitamin D 1, 25 (OH)2 Total: 59 pg/mL
Vitamin D2 1, 25 (OH)2: 10 pg/mL
Vitamin D3 1, 25 (OH)2: 49 pg/mL

## 2019-03-29 ENCOUNTER — Other Ambulatory Visit: Payer: Self-pay

## 2019-03-29 ENCOUNTER — Ambulatory Visit (INDEPENDENT_AMBULATORY_CARE_PROVIDER_SITE_OTHER): Payer: 59 | Admitting: Family Medicine

## 2019-03-29 ENCOUNTER — Encounter (INDEPENDENT_AMBULATORY_CARE_PROVIDER_SITE_OTHER): Payer: Self-pay | Admitting: Family Medicine

## 2019-03-29 VITALS — BP 121/76 | HR 73 | Temp 98.1°F | Ht 67.0 in | Wt 234.0 lb

## 2019-03-29 DIAGNOSIS — E559 Vitamin D deficiency, unspecified: Secondary | ICD-10-CM | POA: Diagnosis not present

## 2019-03-29 DIAGNOSIS — E8881 Metabolic syndrome: Secondary | ICD-10-CM

## 2019-03-29 DIAGNOSIS — Z6836 Body mass index (BMI) 36.0-36.9, adult: Secondary | ICD-10-CM | POA: Diagnosis not present

## 2019-04-04 NOTE — Progress Notes (Signed)
Office: [phone]  /  Fax: [phone]   HPI:   Chief Complaint: OBESITY [facility] is here to discuss her progress with her obesity treatment plan. She is on the Category 3 plan and is following her eating plan approximately 90 % of the time. She states she is walking for 30 minutes 1-2 times per week. [facility] returned to our clinic after 2 weeks back on the plan. She is feeling better with getting back on the plan. She denies hunger or cravings that are overwhelming. She substitutes Yasso or Cleo bars.  Her weight is 234 lb (106.1 kg) today and has gained 2 lbs since her last visit. She has lost 0 lbs since starting treatment with [place].  Insulin Resistance [facility] has a diagnosis of insulin resistance based on her elevated fasting insulin level >5. Last insulin increased to 23.4 (from 13.9), and Hgb A1c of 5.3. Although [facility]'s blood glucose readings are still under good control, insulin resistance puts her at greater risk of metabolic syndrome and diabetes. She is taking metformin currently and continues to work on diet and exercise to decrease risk of diabetes.  Vitamin D Deficiency [facility] has a diagnosis of vitamin D deficiency. She is currently taking Vit D supplementation. She notes fatigue and denies nausea, vomiting or muscle weakness.  ASSESSMENT AND PLAN:  Insulin resistance  Vitamin D deficiency  Class 2 severe obesity with serious comorbidity and body mass index (BMI) of 36.0 to 36.9 in adult, unspecified obesity type ([facility])  PLAN:  Insulin Resistance Chala will continue to work on weight loss, exercise, and decreasing simple carbohydrates in her diet to help decrease the risk of diabetes. We dicussed metformin including benefits and risks. She was informed that eating too many simple carbohydrates or too many calories at one sitting increases the likelihood of GI side effects. Yazmyn agrees to continue taking metformin, no refill needed. [facility] agrees to  follow up with our clinic in 2 weeks as directed to monitor her progress.  Vitamin D Deficiency [facility] was informed that low vitamin D levels contributes to fatigue and are associated with obesity, breast, and colon cancer. Sherby agrees to continue taking Vit D supplementation 2,000 IU daily and will follow up for routine testing of vitamin D, at least 2-3 times per year. She was informed of the risk of over-replacement of vitamin D and agrees to not increase her dose unless she discusses this with [place] first. [facility] agrees to follow up with our clinic in 2 weeks.  I spent > than 50% of the 15 minute visit on counseling as documented in the note.  Obesity Connie [facility] is currently in the action stage of change. As such, her goal is to continue with weight loss efforts She has agreed to follow the Category 3 plan [facility] has been instructed to work up to a goal of 150 minutes of combined cardio and strengthening exercise per week for weight loss and overall health benefits. We discussed the following Behavioral Modification Strategies today: increasing lean protein intake, increasing vegetables and work on meal planning and easy cooking plans, keeping healthy foods in the home, and planning for success   Connie [facility] has agreed to follow up with our clinic in 2 weeks. She was informed of the importance of frequent follow up visits to maximize her success with intensive lifestyle modifications for her multiple health conditions.  ALLERGIES: Allergies  Allergen Reactions  . Penicillins Rash    Has patient had a PCN reaction causing immediate rash, facial/tongue/throat swelling,  SOB or lightheadedness with hypotension: Unknown Has patient had a PCN reaction causing severe rash involving mucus membranes or skin necrosis: Unknown Has patient had a PCN reaction that required hospitalization: No Has patient had a PCN reaction occurring within the last 10 years: No Childhood allergy  If all of the  above answers are "NO", then may proceed with Cephalosporin use.   . Sulfa Antibiotics Rash    MEDICATIONS: Current Outpatient Medications on File Prior to Visit  Medication Sig Dispense Refill  . b complex vitamins tablet Take 1 tablet by mouth daily.    . cetirizine (ZYRTEC) 10 MG tablet Take 10 mg by mouth daily as needed for allergies.    Marland Kitchen esomeprazole (NEXIUM) 40 MG capsule TAKE 1 CAPSULE BY MOUTH DAILY BEFORE BREAKFAST. 90 capsule 0  . KLOR-CON M20 20 MEQ tablet TAKE 1 TABLET (20 MEQ TOTAL) BY MOUTH 3 (THREE) TIMES DAILY. 90 tablet 0  . metFORMIN (GLUCOPHAGE) 500 MG tablet Take 1 tablet (500 mg total) by mouth 2 (two) times daily with a meal. 60 tablet 0  . Multiple Vitamin (MULTIVITAMIN) tablet Take 1 tablet by mouth daily.      Marland Kitchen triamterene-hydrochlorothiazide (MAXZIDE-25) 37.5-25 MG tablet Take 1 tablet by mouth daily.    . Vitamin D, Cholecalciferol, 50 MCG (2000 UT) CAPS Take 2,000 Units by mouth daily.     No current facility-administered medications on file prior to visit.     PAST MEDICAL HISTORY: Past Medical History:  Diagnosis Date  . Acute bronchitis 06/24/2012  . Acute bronchitis with asthma 06/24/2012  . Allergic state 10/15/2013  . Anxiety   . Back pain 10/15/2013  . Chronic sinusitis 02/2012   current runny nose of clear drainage  . Constipation   . Dental crowns present   . Depression   . GERD (gastroesophageal reflux disease)   . H/O gestational diabetes mellitus, not currently pregnant 07/29/2011   h/o   . Headache    history of migraines  . History of blood transfusion   . Hyperglycemia 05/25/2017  . Hypertension   . Iron deficiency anemia   . Leg edema   . Obese   . PCOS (polycystic ovarian syndrome)   . Right shoulder pain 05/25/2017    PAST SURGICAL HISTORY: Past Surgical History:  Procedure Laterality Date  . ABDOMINAL HYSTERECTOMY  02/2017   anemia, fibroids  . COLONOSCOPY    . DILATION AND CURETTAGE OF UTERUS  1987  . NASAL  SEPTOPLASTY W/ TURBINOPLASTY  03/23/2012   Procedure: NASAL SEPTOPLASTY WITH TURBINATE REDUCTION;  Surgeon: Jerrell Belfast, MD;  [facility];  Service: ENT;  Laterality: Bilateral;  . ROBOTIC ASSISTED TOTAL HYSTERECTOMY WITH SALPINGECTOMY N/A 03/17/2017   Procedure: ROBOTIC ASSISTED TOTAL HYSTERECTOMY WITH SALPINGECTOMY;  Surgeon: [facility] Charleston, MD;  [facility];  Service: Gynecology;  Laterality: N/A;  . SINUS ENDO W/FUSION  03/23/2012   Procedure: ENDOSCOPIC SINUS SURGERY WITH FUSION NAVIGATION;  Surgeon: Jerrell Belfast, MD;  Location: [facility];  Service: ENT;  Laterality: Bilateral;  with fusion scan  . UPPER GI ENDOSCOPY    . WISDOM TOOTH EXTRACTION      SOCIAL HISTORY: Social History   Tobacco Use  . Smoking status: Former Smoker    Packs/day: 1.00    Years: 19.00    Pack years: 19.00    Quit date: 07/28/2003    Years since quitting: 15.6  . Smokeless tobacco: Never Used  Substance Use Topics  . Alcohol use: Yes  Comment: occasionally  . Drug use: No    FAMILY HISTORY: Family History  Problem Relation Age of Onset  . Diabetes Mother        type 2  . Hypertension Mother   . Depression Mother   . Mental illness Mother        bi-polar  . Pulmonary embolism Mother   . Obesity Mother   . Heart disease Maternal Grandfather   . Stroke Maternal Grandfather   . Other Father        drug addiction  . Depression Maternal Aunt 46       suicide attempt with pneumonia    ROS: Review of Systems  Constitutional: Positive for malaise/fatigue. Negative for weight loss.  Gastrointestinal: Negative for nausea and vomiting.  Musculoskeletal:       Negative muscle weakness    PHYSICAL EXAM: Blood pressure 121/76, pulse 73, temperature 98.1 F (36.7 C), temperature source Oral, height 5\' 7"  (1.702 m), last menstrual period 03/03/2017, SpO2 97 %. Body mass index is 36.34 kg/m. Physical Exam Vitals signs reviewed.   Constitutional:      Appearance: Normal appearance. She is obese.  Cardiovascular:     Rate and Rhythm: Normal rate.     Pulses: Normal pulses.  Pulmonary:     Effort: Pulmonary effort is normal.     Breath sounds: Normal breath sounds.  Musculoskeletal: Normal range of motion.  Skin:    General: Skin is warm and dry.  Neurological:     Mental Status: She is alert and oriented to person, place, and time.  Psychiatric:        Mood and Affect: Mood normal.        Behavior: Behavior normal.     RECENT LABS AND TESTS: BMET    Component Value Date/Time   NA 139 03/16/2019 1112   K 4.3 03/16/2019 1112   CL 99 03/16/2019 1112   CO2 24 03/16/2019 1112   GLUCOSE 92 03/16/2019 1112   GLUCOSE 80 05/25/2017 1442   BUN 16 03/16/2019 1112   CREATININE 0.94 03/16/2019 1112   CREATININE 0.99 03/31/2016 0952   CALCIUM 9.5 03/16/2019 1112   GFRNONAA 71 03/16/2019 1112   GFRNONAA 68 03/31/2016 0952   GFRAA 82 03/16/2019 1112   GFRAA 78 03/31/2016 0952   Lab Results  Component Value Date   HGBA1C 5.3 03/16/2019   HGBA1C 5.3 09/27/2018   HGBA1C 5.3 06/07/2018   HGBA1C 5.4 01/25/2018   HGBA1C 5.6 10/12/2017   Lab Results  Component Value Date   INSULIN 23.4 03/16/2019   INSULIN 13.9 09/27/2018   INSULIN 16.0 06/07/2018   INSULIN 11.2 01/25/2018   INSULIN 17.0 10/12/2017   CBC    Component Value Date/Time   WBC 6.0 10/12/2017 1122   WBC 6.9 05/25/2017 1442   RBC 4.47 10/12/2017 1122   RBC 4.41 05/25/2017 1442   HGB 13.6 10/12/2017 1122   HCT 40.9 10/12/2017 1122   PLT 242.0 05/25/2017 1442   MCV 92 10/12/2017 1122   MCH 30.4 10/12/2017 1122   MCH 29.8 03/05/2017 1100   MCHC 33.3 10/12/2017 1122   MCHC 33.2 05/25/2017 1442   RDW 13.6 10/12/2017 1122   LYMPHSABS 1.9 10/12/2017 1122   EOSABS 0.5 (H) 10/12/2017 1122   BASOSABS 0.0 10/12/2017 1122   Iron/TIBC/Ferritin/ %Sat    Component Value Date/Time   IRON 116 05/25/2017 1442   TIBC 397 10/29/2011 1552    IRONPCTSAT 7 (L) 10/29/2011 1552   Lipid Panel  Component Value Date/Time   CHOL 205 (H) 03/16/2019 1112   TRIG 124 03/16/2019 1112   HDL 56 03/16/2019 1112   CHOLHDL 3.7 03/16/2019 1112   CHOLHDL 4 05/25/2017 1442   VLDL 17.0 05/25/2017 1442   LDLCALC 124 (H) 03/16/2019 1112   Hepatic Function Panel     Component Value Date/Time   PROT 7.9 03/16/2019 1112   ALBUMIN 4.4 03/16/2019 1112   AST 17 03/16/2019 1112   ALT 22 03/16/2019 1112   ALKPHOS 55 03/16/2019 1112   BILITOT 0.4 03/16/2019 1112   BILIDIR 0.1 10/12/2013 1440   IBILI 0.3 10/12/2013 1440      Component Value Date/Time   TSH 2.600 10/12/2017 1122   TSH 2.32 05/25/2017 1442   TSH 3.24 05/19/2016 1550      OBESITY BEHAVIORAL INTERVENTION VISIT  Today's visit was # 23   Starting weight: 233 lbs Starting date: 10/12/17 Today's weight : 234 lbs  Today's date: 03/29/2019 Total lbs lost to date: 0    ASK: We discussed the diagnosis of obesity with [name] today and Joelene Millin agreed to give [place] permission to discuss obesity behavioral modification therapy today.  ASSESS: Mauresha has the diagnosis of obesity and her BMI today is 36.64 Shavannah is in the action stage of change   ADVISE: Mysty was educated on the multiple health risks of obesity as well as the benefit of weight loss to improve her health. She was advised of the need for long term treatment and the importance of lifestyle modifications to improve her current health and to decrease her risk of future health problems.  AGREE: Multiple dietary modification options and treatment options were discussed and  Zailyn agreed to follow the recommendations documented in the above note.  ARRANGE: Elizabth was educated on the importance of frequent visits to treat obesity as outlined per CMS and USPSTF guidelines and agreed to schedule her next follow up appointment today.  I, Trixie Dredge, am acting as transcriptionist for Ilene Qua, MD  I have reviewed the above documentation for accuracy and completeness, and I agree with the above. - Ilene Qua, MD

## 2019-04-07 ENCOUNTER — Other Ambulatory Visit: Payer: Self-pay

## 2019-04-07 ENCOUNTER — Ambulatory Visit (INDEPENDENT_AMBULATORY_CARE_PROVIDER_SITE_OTHER): Payer: 59

## 2019-04-07 DIAGNOSIS — Z23 Encounter for immunization: Secondary | ICD-10-CM

## 2019-04-13 ENCOUNTER — Other Ambulatory Visit: Payer: Self-pay | Admitting: Family Medicine

## 2019-04-13 ENCOUNTER — Encounter (INDEPENDENT_AMBULATORY_CARE_PROVIDER_SITE_OTHER): Payer: Self-pay

## 2019-04-14 LAB — SPECIMEN STATUS REPORT

## 2019-04-14 LAB — VITAMIN D 25 HYDROXY (VIT D DEFICIENCY, FRACTURES): Vit D, 25-Hydroxy: 43.9 ng/mL (ref 30.0–100.0)

## 2019-04-15 ENCOUNTER — Other Ambulatory Visit (INDEPENDENT_AMBULATORY_CARE_PROVIDER_SITE_OTHER): Payer: Self-pay | Admitting: Family Medicine

## 2019-04-15 DIAGNOSIS — E8881 Metabolic syndrome: Secondary | ICD-10-CM

## 2019-04-17 ENCOUNTER — Ambulatory Visit (INDEPENDENT_AMBULATORY_CARE_PROVIDER_SITE_OTHER): Payer: 59 | Admitting: Family Medicine

## 2019-05-02 ENCOUNTER — Telehealth: Payer: Self-pay

## 2019-05-02 ENCOUNTER — Other Ambulatory Visit: Payer: Self-pay

## 2019-05-02 ENCOUNTER — Ambulatory Visit (INDEPENDENT_AMBULATORY_CARE_PROVIDER_SITE_OTHER): Payer: 59 | Admitting: Family Medicine

## 2019-05-02 DIAGNOSIS — Z20828 Contact with and (suspected) exposure to other viral communicable diseases: Secondary | ICD-10-CM

## 2019-05-02 DIAGNOSIS — Z20822 Contact with and (suspected) exposure to covid-19: Secondary | ICD-10-CM

## 2019-05-02 DIAGNOSIS — J029 Acute pharyngitis, unspecified: Secondary | ICD-10-CM | POA: Diagnosis not present

## 2019-05-02 DIAGNOSIS — R739 Hyperglycemia, unspecified: Secondary | ICD-10-CM

## 2019-05-02 MED ORDER — AZITHROMYCIN 250 MG PO TABS: ORAL_TABLET | ORAL | 0 refills | Status: DC

## 2019-05-02 NOTE — Telephone Encounter (Signed)
Pharmacy called and stated that with the diagnosis of exposure to covid their pharmacy can not give out the zpack. If she would have tested positive they could.  However she states there is a way around this.  Pharmacist states if provider sends in the azithromycin "loose pills" and not write rx as a zpak they could refill it due to their strict guidelines.

## 2019-05-02 NOTE — Telephone Encounter (Signed)
Spoke with pharmacist directly. Clarified that this was not prescribed for Covid exposure but for pharyngitis. So prescription was processed.

## 2019-05-02 NOTE — Telephone Encounter (Signed)
I gave this to her for pharyngitis so they should be able to give it to her

## 2019-05-02 NOTE — Telephone Encounter (Signed)
Prescription approved.

## 2019-05-02 NOTE — Telephone Encounter (Signed)
Copied from [place] [id number]. Topic: General - Inquiry >> May 02, 2019  1:14 PM Mathis Bud wrote: Reason for CRM: Patient states she had a virtual appt with PCP. Patient called stating pharmacy will not give her the zepac for possible covid exposure and patient is requesting pcp to send something else over to pharmacy. Patient is also requesting a call back when pcp has sent something over.  Call back # [phone] PHARMACY  [facility] #[id number] - [place] [place], [place] [phone] (Phone) [phone] (Fax)

## 2019-05-02 NOTE — Addendum Note (Signed)
Addended by: Magdalene Molly A on: 05/02/2019 03:11 PM   Modules accepted: Orders

## 2019-05-02 NOTE — Addendum Note (Signed)
Addended by: Magdalene Molly A on: 05/02/2019 02:53 PM   Modules accepted: Orders

## 2019-05-04 LAB — NOVEL CORONAVIRUS, NAA: SARS-CoV-2, NAA: NOT DETECTED

## 2019-05-07 DIAGNOSIS — J029 Acute pharyngitis, unspecified: Secondary | ICD-10-CM

## 2019-05-07 HISTORY — DX: Acute pharyngitis, unspecified: J02.9

## 2019-05-07 NOTE — Progress Notes (Signed)
Virtual Visit via Video Note  I connected with [facility] on 05/02/19 at 10:00 AM EDT by a video enabled telemedicine application and verified that I am speaking with the correct person using two identifiers.  Location: Patient: home Provider: office   I discussed the limitations of evaluation and management by telemedicine and the availability of in person appointments. The patient expressed understanding and agreed to proceed. Magdalene Molly, CMA was able to get the patient set up on visit, video   Subjective:    Patient ID: [facility], female    DOB: [birth year], 48 y.o.   MRN: [id number]  No chief complaint on file.   HPI Patient is in today for evaluation of sore throat. She is noting several days of burning in her throat with redness and white patch on right. She notes nasal congestion and some sneazing as well. Some fatigue is endorsed but no fevers, nausea, diarrhea or SOB. Notes her daughter had a stuffy head and sore throat for awhile but is better now. Denies CP/palp/SOB/HA/fevers/GI or GU c/o. Taking meds as prescribed   Past Medical History:  Diagnosis Date  . Acute bronchitis 06/24/2012  . Acute bronchitis with asthma 06/24/2012  . Allergic state 10/15/2013  . Anxiety   . Back pain 10/15/2013  . Chronic sinusitis 02/2012   current runny nose of clear drainage  . Constipation   . Dental crowns present   . Depression   . GERD (gastroesophageal reflux disease)   . H/O gestational diabetes mellitus, not currently pregnant 07/29/2011   h/o   . Headache    history of migraines  . History of blood transfusion   . Hyperglycemia 05/25/2017  . Hypertension   . Iron deficiency anemia   . Leg edema   . Obese   . PCOS (polycystic ovarian syndrome)   . Right shoulder pain 05/25/2017    Past Surgical History:  Procedure Laterality Date  . ABDOMINAL HYSTERECTOMY  02/2017   anemia, fibroids  . COLONOSCOPY    . DILATION AND CURETTAGE OF UTERUS  1987  . NASAL  SEPTOPLASTY W/ TURBINOPLASTY  03/23/2012   Procedure: NASAL SEPTOPLASTY WITH TURBINATE REDUCTION;  Surgeon: Jerrell Belfast, MD;  Location: [facility];  Service: ENT;  Laterality: Bilateral;  . ROBOTIC ASSISTED TOTAL HYSTERECTOMY WITH SALPINGECTOMY N/A 03/17/2017   Procedure: ROBOTIC ASSISTED TOTAL HYSTERECTOMY WITH SALPINGECTOMY;  Surgeon: Bobbye Charleston, MD;  Location: [facility];  Service: Gynecology;  Laterality: N/A;  . SINUS ENDO W/FUSION  03/23/2012   Procedure: ENDOSCOPIC SINUS SURGERY WITH FUSION NAVIGATION;  Surgeon: Jerrell Belfast, MD;  Location: [facility];  Service: ENT;  Laterality: Bilateral;  with fusion scan  . UPPER GI ENDOSCOPY    . WISDOM TOOTH EXTRACTION      Family History  Problem Relation Age of Onset  . Diabetes Mother        type 2  . Hypertension Mother   . Depression Mother   . Mental illness Mother        bi-polar  . Pulmonary embolism Mother   . Obesity Mother   . Heart disease Maternal Grandfather   . Stroke Maternal Grandfather   . Other Father        drug addiction  . Depression Maternal Aunt 46       suicide attempt with pneumonia    Social History   Socioeconomic History  . Marital status: Married    Spouse name: Dellis Filbert  . Number of children: 4  .  Years of education: Not on file  . Highest education level: Not on file  Occupational History  . Occupation: Therapist, sports  Social Needs  . Financial resource strain: Not on file  . Food insecurity    Worry: Not on file    Inability: Not on file  . Transportation needs    Medical: Not on file    Non-medical: Not on file  Tobacco Use  . Smoking status: Former Smoker    Packs/day: 1.00    Years: 19.00    Pack years: 19.00    Quit date: 07/28/2003    Years since quitting: 15.7  . Smokeless tobacco: Never Used  Substance and Sexual Activity  . Alcohol use: Yes    Comment: occasionally  . Drug use: No  . Sexual activity: Yes    Partners: Male    Birth  control/protection: None    Comment: lives with husband, stepson 25, son and daughter. no dietary restrictions. eating heart healthy, walking more  Lifestyle  . Physical activity    Days per week: Not on file    Minutes per session: Not on file  . Stress: Not on file  Relationships  . Social Herbalist on phone: Not on file    Gets together: Not on file    Attends religious service: Not on file    Active member of club or organization: Not on file    Attends meetings of clubs or organizations: Not on file    Relationship status: Not on file  . Intimate partner violence    Fear of current or ex partner: Not on file    Emotionally abused: Not on file    Physically abused: Not on file    Forced sexual activity: Not on file  Other Topics Concern  . Not on file  Social History Narrative  . Not on file    Outpatient Medications Prior to Visit  Medication Sig Dispense Refill  . b complex vitamins tablet Take 1 tablet by mouth daily.    . cetirizine (ZYRTEC) 10 MG tablet Take 10 mg by mouth daily as needed for allergies.    Marland Kitchen esomeprazole (NEXIUM) 40 MG capsule TAKE 1 CAPSULE BY MOUTH DAILY BEFORE BREAKFAST. 90 capsule 0  . KLOR-CON M20 20 MEQ tablet TAKE 1 TABLET (20 MEQ TOTAL) BY MOUTH 3 (THREE) TIMES DAILY. 90 tablet 0  . metFORMIN (GLUCOPHAGE) 500 MG tablet Take 1 tablet (500 mg total) by mouth 2 (two) times daily with a meal. 60 tablet 0  . Multiple Vitamin (MULTIVITAMIN) tablet Take 1 tablet by mouth daily.      Marland Kitchen triamterene-hydrochlorothiazide (MAXZIDE-25) 37.5-25 MG tablet TAKE 1 TABLET BY MOUTH EVERY DAY 90 tablet 1  . Vitamin D, Cholecalciferol, 50 MCG (2000 UT) CAPS Take 2,000 Units by mouth daily.     No facility-administered medications prior to visit.     Allergies  Allergen Reactions  . Penicillins Rash    Has patient had a PCN reaction causing immediate rash, facial/tongue/throat swelling, SOB or lightheadedness with hypotension: Unknown Has patient had  a PCN reaction causing severe rash involving mucus membranes or skin necrosis: Unknown Has patient had a PCN reaction that required hospitalization: No Has patient had a PCN reaction occurring within the last 10 years: No Childhood allergy  If all of the above answers are "NO", then may proceed with Cephalosporin use.   . Sulfa Antibiotics Rash    Review of Systems  Constitutional: Positive for malaise/fatigue. Negative  for fever.  HENT: Positive for congestion and sore throat.   Eyes: Negative for blurred vision.  Respiratory: Negative for shortness of breath.   Cardiovascular: Negative for chest pain, palpitations and leg swelling.  Gastrointestinal: Negative for abdominal pain, blood in stool and nausea.  Genitourinary: Negative for dysuria and frequency.  Musculoskeletal: Negative for falls.  Skin: Negative for rash.  Neurological: Negative for dizziness, loss of consciousness and headaches.  Endo/Heme/Allergies: Negative for environmental allergies.  Psychiatric/Behavioral: Negative for depression. The patient is not nervous/anxious.        Objective:    Physical Exam Constitutional:      Appearance: Normal appearance. She is not ill-appearing.  HENT:     Head: Normocephalic and atraumatic.  Eyes:     General:        Right eye: No discharge.        Left eye: No discharge.  Pulmonary:     Effort: Pulmonary effort is normal.  Neurological:     Mental Status: She is alert and oriented to person, place, and time.  Psychiatric:        Mood and Affect: Mood normal.        Behavior: Behavior normal.     LMP 03/03/2017 (Exact Date)  Wt Readings from Last 3 Encounters:  03/29/19 234 lb (106.1 kg)  03/16/19 232 lb (105.2 kg)  09/27/18 219 lb (99.3 kg)    Diabetic Foot Exam - Simple   No data filed     Lab Results  Component Value Date   WBC 6.0 10/12/2017   HGB 13.6 10/12/2017   HCT 40.9 10/12/2017   PLT 242.0 05/25/2017   GLUCOSE 92 03/16/2019   CHOL 205  (H) 03/16/2019   TRIG 124 03/16/2019   HDL 56 03/16/2019   LDLCALC 124 (H) 03/16/2019   ALT 22 03/16/2019   AST 17 03/16/2019   NA 139 03/16/2019   K 4.3 03/16/2019   CL 99 03/16/2019   CREATININE 0.94 03/16/2019   BUN 16 03/16/2019   CO2 24 03/16/2019   TSH 2.600 10/12/2017   HGBA1C 5.3 03/16/2019    Lab Results  Component Value Date   TSH 2.600 10/12/2017   Lab Results  Component Value Date   WBC 6.0 10/12/2017   HGB 13.6 10/12/2017   HCT 40.9 10/12/2017   MCV 92 10/12/2017   PLT 242.0 05/25/2017   Lab Results  Component Value Date   NA 139 03/16/2019   K 4.3 03/16/2019   CO2 24 03/16/2019   GLUCOSE 92 03/16/2019   BUN 16 03/16/2019   CREATININE 0.94 03/16/2019   BILITOT 0.4 03/16/2019   ALKPHOS 55 03/16/2019   AST 17 03/16/2019   ALT 22 03/16/2019   PROT 7.9 03/16/2019   ALBUMIN 4.4 03/16/2019   CALCIUM 9.5 03/16/2019   GFR 64.23 05/25/2017   Lab Results  Component Value Date   CHOL 205 (H) 03/16/2019   Lab Results  Component Value Date   HDL 56 03/16/2019   Lab Results  Component Value Date   LDLCALC 124 (H) 03/16/2019   Lab Results  Component Value Date   TRIG 124 03/16/2019   Lab Results  Component Value Date   CHOLHDL 3.7 03/16/2019   Lab Results  Component Value Date   HGBA1C 5.3 03/16/2019       Assessment & Plan:   Problem List Items Addressed This Visit    Hyperglycemia    hgba1c acceptable, minimize simple carbs. Increase exercise as tolerated.  Pharyngitis    Started on Azithromycin for possible stress and encouraged to use probiotic daily. Sent for COVID testing which was negative ultimately       Other Visit Diagnoses    Exposure to COVID-19 virus    -  Primary   Sore throat          I am having Lucas Mallow. Berline Lopes maintain her multivitamin, esomeprazole, b complex vitamins, cetirizine, Vitamin D (Cholecalciferol), Klor-Con M20, metFORMIN, and triamterene-hydrochlorothiazide.  Meds ordered this encounter   Medications  . DISCONTD: azithromycin (ZITHROMAX) 250 MG tablet    Sig: 2 tabs po once and then 1 tab po daily    Dispense:  6 tablet    Refill:  0    I discussed the assessment and treatment plan with the patient. The patient was provided an opportunity to ask questions and all were answered. The patient agreed with the plan and demonstrated an understanding of the instructions.   The patient was advised to call back or seek an in-person evaluation if the symptoms worsen or if the condition fails to improve as anticipated.  I provided 15 minutes of non-face-to-face time during this encounter.   Penni Homans, MD

## 2019-05-07 NOTE — Assessment & Plan Note (Signed)
hgba1c acceptable, minimize simple carbs. Increase exercise as tolerated.  

## 2019-05-07 NOTE — Assessment & Plan Note (Signed)
Started on Azithromycin for possible stress and encouraged to use probiotic daily. Sent for COVID testing which was negative ultimately

## 2019-05-16 ENCOUNTER — Ambulatory Visit (INDEPENDENT_AMBULATORY_CARE_PROVIDER_SITE_OTHER): Payer: 59 | Admitting: Family Medicine

## 2019-05-16 ENCOUNTER — Encounter (INDEPENDENT_AMBULATORY_CARE_PROVIDER_SITE_OTHER): Payer: Self-pay

## 2019-05-31 ENCOUNTER — Ambulatory Visit (INDEPENDENT_AMBULATORY_CARE_PROVIDER_SITE_OTHER): Payer: 59 | Admitting: Family Medicine

## 2019-05-31 ENCOUNTER — Other Ambulatory Visit: Payer: Self-pay

## 2019-05-31 VITALS — BP 119/77 | HR 81 | Temp 98.1°F | Ht 67.0 in | Wt 233.0 lb

## 2019-05-31 DIAGNOSIS — Z6836 Body mass index (BMI) 36.0-36.9, adult: Secondary | ICD-10-CM | POA: Diagnosis not present

## 2019-05-31 DIAGNOSIS — R7303 Prediabetes: Secondary | ICD-10-CM | POA: Diagnosis not present

## 2019-05-31 DIAGNOSIS — Z9189 Other specified personal risk factors, not elsewhere classified: Secondary | ICD-10-CM | POA: Diagnosis not present

## 2019-05-31 MED ORDER — METFORMIN HCL 500 MG PO TABS: 500.0000 mg | ORAL_TABLET | Freq: Two times a day (BID) | ORAL | 0 refills | Status: DC

## 2019-05-31 NOTE — Progress Notes (Signed)
Office: [phone]  /  Fax: [phone]   HPI:   Chief Complaint: OBESITY Connie Blanchard is here to discuss her progress with her obesity treatment plan. She is on the Category 3 plan and is following her eating plan approximately 20-30% of the time. She states she is doing Psychologist, educational classes 45 minutes 3 times per week. Connie Blanchard has gotten off track with her diet and struggles with meal planning, especially for dinner. Hunger is mostly controlled. Her weight is 233 lb (105.7 kg) today and has had a weight loss of 1 pound over a period of 2 months since her last visit. She has lost 0 lbs since starting treatment with [place].  Pre-Diabetes Connie Blanchard has a diagnosis of prediabetes based on her elevated Hgb A1c and was informed this puts her at greater risk of developing diabetes. She is tolerating metformin well and continues to work on diet but is struggling more this month. She denies nausea, vomiting, or hypoglycemia.  At risk for diabetes Connie Blanchard is at higher than average risk for developing diabetes due to her obesity. She currently denies polyuria or polydipsia.  ASSESSMENT AND PLAN:  Insulin resistance  Prediabetes - Plan: metFORMIN (GLUCOPHAGE) 500 MG tablet  At risk for diabetes mellitus  Class 2 severe obesity with serious comorbidity and body mass index (BMI) of 36.0 to 36.9 in adult, unspecified obesity type ([place])  PLAN:  Pre-Diabetes Connie Blanchard will continue to work on weight loss, exercise, and decreasing simple carbohydrates in her diet to help decrease the risk of diabetes. We dicussed metformin including benefits and risks. She was informed that eating too many simple carbohydrates or too many calories at one sitting increases the likelihood of GI side effects. Connie Blanchard was given a refill on her metformin 500 mg #60 with 0 refills and agrees to follow-up with our clinic in 2-3 weeks. She will continue her diet as prescribed.  Diabetes risk counseling Connie Blanchard was  given extended (15 minutes) diabetes prevention counseling today. She is 48 y.o. female and has risk factors for diabetes including obesity. We discussed intensive lifestyle modifications today with an emphasis on weight loss as well as increasing exercise and decreasing simple carbohydrates in her diet.  Obesity Connie Blanchard is currently in the action stage of change. As such, her goal is to continue with weight loss efforts. She has agreed to follow the Category 3 plan and journal 350-550 calories and 40+ grams of protein at supper. Shredded [employer] were given. Connie Blanchard has been instructed to work up to a goal of 150 minutes of combined cardio and strengthening exercise per week for weight loss and overall health benefits. We discussed the following Behavioral Modification Strategies today: increasing lean protein intake and decreasing simple carbohydrates.  Connie Blanchard has agreed to follow-up with our clinic in 2-3 weeks. She was informed of the importance of frequent follow-up visits to maximize her success with intensive lifestyle modifications for her multiple health conditions.  ALLERGIES: Allergies  Allergen Reactions   Penicillins Rash    Has patient had a PCN reaction causing immediate rash, facial/tongue/throat swelling, SOB or lightheadedness with hypotension: Unknown Has patient had a PCN reaction causing severe rash involving mucus membranes or skin necrosis: Unknown Has patient had a PCN reaction that required hospitalization: No Has patient had a PCN reaction occurring within the last 10 years: No Childhood allergy  If all of the above answers are "NO", then may proceed with Cephalosporin use.    Sulfa Antibiotics Rash    MEDICATIONS:  Current Outpatient Medications on File Prior to Visit  Medication Sig Dispense Refill   b complex vitamins tablet Take 1 tablet by mouth daily.     cetirizine (ZYRTEC) 10 MG tablet Take 10 mg by mouth daily as needed for allergies.       esomeprazole (NEXIUM) 40 MG capsule TAKE 1 CAPSULE BY MOUTH DAILY BEFORE BREAKFAST. 90 capsule 0   estradiol (ESTRACE) 0.5 MG tablet Take 0.5 mg by mouth daily.     KLOR-CON M20 20 MEQ tablet TAKE 1 TABLET (20 MEQ TOTAL) BY MOUTH 3 (THREE) TIMES DAILY. 90 tablet 0   Multiple Vitamin (MULTIVITAMIN) tablet Take 1 tablet by mouth daily.       triamterene-hydrochlorothiazide (MAXZIDE-25) 37.5-25 MG tablet TAKE 1 TABLET BY MOUTH EVERY DAY 90 tablet 1   Vitamin D, Cholecalciferol, 50 MCG (2000 UT) CAPS Take 2,000 Units by mouth daily.     No current facility-administered medications on file prior to visit.     PAST MEDICAL HISTORY: Past Medical History:  Diagnosis Date   Acute bronchitis 06/24/2012   Acute bronchitis with asthma 06/24/2012   Allergic state 10/15/2013   Anxiety    Back pain 10/15/2013   Chronic sinusitis 02/2012   current runny nose of clear drainage   Constipation    Dental crowns present    Depression    GERD (gastroesophageal reflux disease)    H/O gestational diabetes mellitus, not currently pregnant 07/29/2011   h/o    Headache    history of migraines   History of blood transfusion    Hyperglycemia 05/25/2017   Hypertension    Iron deficiency anemia    Leg edema    Obese    PCOS (polycystic ovarian syndrome)    Right shoulder pain 05/25/2017    PAST SURGICAL HISTORY: Past Surgical History:  Procedure Laterality Date   ABDOMINAL HYSTERECTOMY  02/2017   anemia, fibroids   COLONOSCOPY     DILATION AND CURETTAGE OF UTERUS  1987   NASAL SEPTOPLASTY W/ TURBINOPLASTY  03/23/2012   Procedure: NASAL SEPTOPLASTY WITH TURBINATE REDUCTION;  Surgeon: Jerrell Belfast, MD;  Location: [facility];  Service: ENT;  Laterality: Bilateral;   ROBOTIC ASSISTED TOTAL HYSTERECTOMY WITH SALPINGECTOMY N/A 03/17/2017   Procedure: ROBOTIC ASSISTED TOTAL HYSTERECTOMY WITH SALPINGECTOMY;  Surgeon: Bobbye Charleston, MD;  Location: [facility];   Service: Gynecology;  Laterality: N/A;   SINUS ENDO W/FUSION  03/23/2012   Procedure: ENDOSCOPIC SINUS SURGERY WITH FUSION NAVIGATION;  Surgeon: Jerrell Belfast, MD;  Location: [facility];  Service: ENT;  Laterality: Bilateral;  with fusion scan   UPPER GI ENDOSCOPY     WISDOM TOOTH EXTRACTION      SOCIAL HISTORY: Social History   Tobacco Use   Smoking status: Former Smoker    Packs/day: 1.00    Years: 19.00    Pack years: 19.00    Quit date: 07/28/2003    Years since quitting: 15.8   Smokeless tobacco: Never Used  Substance Use Topics   Alcohol use: Yes    Comment: occasionally   Drug use: No    FAMILY HISTORY: Family History  Problem Relation Age of Onset   Diabetes Mother        type 2   Hypertension Mother    Depression Mother    Mental illness Mother        bi-polar   Pulmonary embolism Mother    Obesity Mother    Heart disease Maternal Grandfather  Stroke Maternal Grandfather    Other Father        drug addiction   Depression Maternal Aunt 46       suicide attempt with pneumonia   ROS: Review of Systems  Gastrointestinal: Negative for nausea and vomiting.  Endo/Heme/Allergies:       Negative for hypoglycemia.   PHYSICAL EXAM: Blood pressure 119/77, pulse 81, temperature 98.1 F (36.7 C), temperature source Oral, height 5\' 7"  (1.702 m), weight 233 lb (105.7 kg), last menstrual period 03/03/2017, SpO2 94 %. Body mass index is 36.49 kg/m. Physical Exam Vitals signs reviewed.  Constitutional:      Appearance: Normal appearance. She is obese.  Cardiovascular:     Rate and Rhythm: Normal rate.     Pulses: Normal pulses.  Pulmonary:     Effort: Pulmonary effort is normal.     Breath sounds: Normal breath sounds.  Musculoskeletal: Normal range of motion.  Skin:    General: Skin is warm and dry.  Neurological:     Mental Status: She is alert and oriented to person, place, and time.  Psychiatric:        Behavior: Behavior  normal.   RECENT LABS AND TESTS: BMET    Component Value Date/Time   NA 139 03/16/2019 1112   K 4.3 03/16/2019 1112   CL 99 03/16/2019 1112   CO2 24 03/16/2019 1112   GLUCOSE 92 03/16/2019 1112   GLUCOSE 80 05/25/2017 1442   BUN 16 03/16/2019 1112   CREATININE 0.94 03/16/2019 1112   CREATININE 0.99 03/31/2016 0952   CALCIUM 9.5 03/16/2019 1112   GFRNONAA 71 03/16/2019 1112   GFRNONAA 68 03/31/2016 0952   GFRAA 82 03/16/2019 1112   GFRAA 78 03/31/2016 0952   Lab Results  Component Value Date   HGBA1C 5.3 03/16/2019   HGBA1C 5.3 09/27/2018   HGBA1C 5.3 06/07/2018   HGBA1C 5.4 01/25/2018   HGBA1C 5.6 10/12/2017   Lab Results  Component Value Date   INSULIN 23.4 03/16/2019   INSULIN 13.9 09/27/2018   INSULIN 16.0 06/07/2018   INSULIN 11.2 01/25/2018   INSULIN 17.0 10/12/2017   CBC    Component Value Date/Time   WBC 6.0 10/12/2017 1122   WBC 6.9 05/25/2017 1442   RBC 4.47 10/12/2017 1122   RBC 4.41 05/25/2017 1442   HGB 13.6 10/12/2017 1122   HCT 40.9 10/12/2017 1122   PLT 242.0 05/25/2017 1442   MCV 92 10/12/2017 1122   MCH 30.4 10/12/2017 1122   MCH 29.8 03/05/2017 1100   MCHC 33.3 10/12/2017 1122   MCHC 33.2 05/25/2017 1442   RDW 13.6 10/12/2017 1122   LYMPHSABS 1.9 10/12/2017 1122   EOSABS 0.5 (H) 10/12/2017 1122   BASOSABS 0.0 10/12/2017 1122   Iron/TIBC/Ferritin/ %Sat    Component Value Date/Time   IRON 116 05/25/2017 1442   TIBC 397 10/29/2011 1552   IRONPCTSAT 7 (L) 10/29/2011 1552   Lipid Panel     Component Value Date/Time   CHOL 205 (H) 03/16/2019 1112   TRIG 124 03/16/2019 1112   HDL 56 03/16/2019 1112   CHOLHDL 3.7 03/16/2019 1112   CHOLHDL 4 05/25/2017 1442   VLDL 17.0 05/25/2017 1442   LDLCALC 124 (H) 03/16/2019 1112   Hepatic Function Panel     Component Value Date/Time   PROT 7.9 03/16/2019 1112   ALBUMIN 4.4 03/16/2019 1112   AST 17 03/16/2019 1112   ALT 22 03/16/2019 1112   ALKPHOS 55 03/16/2019 1112   BILITOT 0.4  03/16/2019 1112   BILIDIR 0.1 10/12/2013 1440   IBILI 0.3 10/12/2013 1440      Component Value Date/Time   TSH 2.600 10/12/2017 1122   TSH 2.32 05/25/2017 1442   TSH 3.24 05/19/2016 1550   Results for ALIZAY, SCALISI (MRN [id number]) as of 05/31/2019 17:26  Ref. Range 03/16/2019 11:12  Vitamin D, 25-Hydroxy Latest Ref Range: 30.0 - 100.0 ng/mL 43.9   OBESITY BEHAVIORAL INTERVENTION VISIT  Today's visit was #24  Starting weight: 233 lbs Starting date: 10/12/2017 Today's weight: 233 lbs  Today's date: 05/31/2019 Total lbs lost to date: 0    05/31/2019  Height 5\' 7"  (1.702 m)  Weight 233 lb (105.7 kg)  BMI (Calculated) 36.48  BLOOD PRESSURE - SYSTOLIC [id number]  BLOOD PRESSURE - DIASTOLIC 77   Body Fat % 41 %  Total Body Water (lbs) 95.2 lbs   ASK: We discussed the diagnosis of obesity with Connie Blanchard today and Connie Blanchard agreed to give [place] permission to discuss obesity behavioral modification therapy today.  ASSESS: Connie Blanchard has the diagnosis of obesity and her BMI today is 36.6. Connie Blanchard is in the action stage of change.   ADVISE: Connie Blanchard was educated on the multiple health risks of obesity as well as the benefit of weight loss to improve her health. She was advised of the need for long term treatment and the importance of lifestyle modifications to improve her current health and to decrease her risk of future health problems.  AGREE: Multiple dietary modification options and treatment options were discussed and  Connie Blanchard agreed to follow the recommendations documented in the above note.  ARRANGE: Connie Blanchard was educated on the importance of frequent visits to treat obesity as outlined per CMS and USPSTF guidelines and agreed to schedule her next follow up appointment today.  I, Michaelene Song, am acting as Location manager for Dennard Nip, MD  I have reviewed the above documentation for accuracy and completeness, and I agree with the above. -Dennard Nip, MD

## 2019-06-05 ENCOUNTER — Encounter: Payer: Self-pay | Admitting: Family Medicine

## 2019-06-17 ENCOUNTER — Encounter (INDEPENDENT_AMBULATORY_CARE_PROVIDER_SITE_OTHER): Payer: Self-pay | Admitting: Family Medicine

## 2019-06-19 ENCOUNTER — Ambulatory Visit (INDEPENDENT_AMBULATORY_CARE_PROVIDER_SITE_OTHER): Payer: 59 | Admitting: Family Medicine

## 2019-06-24 ENCOUNTER — Other Ambulatory Visit (INDEPENDENT_AMBULATORY_CARE_PROVIDER_SITE_OTHER): Payer: Self-pay | Admitting: Family Medicine

## 2019-06-24 DIAGNOSIS — R7303 Prediabetes: Secondary | ICD-10-CM

## 2019-07-08 ENCOUNTER — Encounter (INDEPENDENT_AMBULATORY_CARE_PROVIDER_SITE_OTHER): Payer: Self-pay | Admitting: Family Medicine

## 2019-07-13 ENCOUNTER — Ambulatory Visit: Payer: Self-pay | Admitting: Family Medicine

## 2019-07-29 ENCOUNTER — Other Ambulatory Visit (INDEPENDENT_AMBULATORY_CARE_PROVIDER_SITE_OTHER): Payer: Self-pay | Admitting: Family Medicine

## 2019-07-29 DIAGNOSIS — R7303 Prediabetes: Secondary | ICD-10-CM

## 2019-08-26 ENCOUNTER — Encounter: Payer: Self-pay | Admitting: Family Medicine

## 2019-08-28 NOTE — Telephone Encounter (Signed)
Spoke with patient about message below and asked her to request refills in her sons chart she agreed and voiced her understanding

## 2019-09-27 ENCOUNTER — Encounter: Payer: Self-pay | Admitting: Family Medicine

## 2019-09-27 ENCOUNTER — Other Ambulatory Visit: Payer: Self-pay | Admitting: Family Medicine

## 2019-09-27 DIAGNOSIS — I10 Essential (primary) hypertension: Secondary | ICD-10-CM

## 2019-09-27 DIAGNOSIS — E782 Mixed hyperlipidemia: Secondary | ICD-10-CM

## 2019-09-27 DIAGNOSIS — R600 Localized edema: Secondary | ICD-10-CM

## 2019-09-27 MED ORDER — SUMATRIPTAN SUCCINATE 50 MG PO TABS: 50.0000 mg | ORAL_TABLET | ORAL | 2 refills | Status: DC | PRN

## 2020-01-30 ENCOUNTER — Other Ambulatory Visit: Payer: Self-pay | Admitting: Surgery

## 2020-01-31 ENCOUNTER — Other Ambulatory Visit: Payer: Self-pay | Admitting: Obstetrics and Gynecology

## 2020-01-31 DIAGNOSIS — R1032 Left lower quadrant pain: Secondary | ICD-10-CM

## 2020-02-16 ENCOUNTER — Ambulatory Visit
Admission: RE | Admit: 2020-02-16 | Discharge: 2020-02-16 | Disposition: A | Discharge status: Home / Self Care | Payer: 59 | Source: Ambulatory Visit | Attending: Obstetrics and Gynecology | Admitting: Obstetrics and Gynecology

## 2020-02-16 ENCOUNTER — Other Ambulatory Visit: Payer: Self-pay

## 2020-02-16 DIAGNOSIS — R1032 Left lower quadrant pain: Secondary | ICD-10-CM

## 2020-02-16 MED ORDER — IOPAMIDOL (ISOVUE-300) INJECTION 61%
100.0000 mL | Freq: Once | INTRAVENOUS | Status: AC | PRN
  Administered 2020-02-16: 100 mL via INTRAVENOUS

## 2020-03-13 ENCOUNTER — Encounter (INDEPENDENT_AMBULATORY_CARE_PROVIDER_SITE_OTHER): Payer: Self-pay

## 2020-03-14 ENCOUNTER — Encounter: Payer: Self-pay | Admitting: Family Medicine

## 2020-03-26 ENCOUNTER — Ambulatory Visit (INDEPENDENT_AMBULATORY_CARE_PROVIDER_SITE_OTHER): Payer: 59 | Admitting: Family Medicine

## 2020-03-26 ENCOUNTER — Other Ambulatory Visit: Payer: Self-pay

## 2020-03-26 ENCOUNTER — Encounter (INDEPENDENT_AMBULATORY_CARE_PROVIDER_SITE_OTHER): Payer: Self-pay | Admitting: Family Medicine

## 2020-03-26 VITALS — BP 122/87 | HR 75 | Temp 98.3°F | Ht 67.0 in | Wt 217.0 lb

## 2020-03-26 DIAGNOSIS — I10 Essential (primary) hypertension: Secondary | ICD-10-CM | POA: Diagnosis not present

## 2020-03-26 DIAGNOSIS — Z9189 Other specified personal risk factors, not elsewhere classified: Secondary | ICD-10-CM

## 2020-03-26 DIAGNOSIS — K219 Gastro-esophageal reflux disease without esophagitis: Secondary | ICD-10-CM | POA: Diagnosis not present

## 2020-03-26 DIAGNOSIS — E559 Vitamin D deficiency, unspecified: Secondary | ICD-10-CM

## 2020-03-26 DIAGNOSIS — R5383 Other fatigue: Secondary | ICD-10-CM

## 2020-03-26 DIAGNOSIS — E88819 Insulin resistance, unspecified: Secondary | ICD-10-CM

## 2020-03-26 DIAGNOSIS — R0602 Shortness of breath: Secondary | ICD-10-CM | POA: Diagnosis not present

## 2020-03-26 DIAGNOSIS — Z8632 Personal history of gestational diabetes: Secondary | ICD-10-CM

## 2020-03-26 DIAGNOSIS — E669 Obesity, unspecified: Secondary | ICD-10-CM

## 2020-03-26 DIAGNOSIS — Z6834 Body mass index (BMI) 34.0-34.9, adult: Secondary | ICD-10-CM

## 2020-03-26 DIAGNOSIS — Z1331 Encounter for screening for depression: Secondary | ICD-10-CM | POA: Diagnosis not present

## 2020-03-26 DIAGNOSIS — E8881 Metabolic syndrome: Secondary | ICD-10-CM

## 2020-03-26 DIAGNOSIS — Z0289 Encounter for other administrative examinations: Secondary | ICD-10-CM

## 2020-03-26 DIAGNOSIS — F3289 Other specified depressive episodes: Secondary | ICD-10-CM

## 2020-03-26 HISTORY — DX: Personal history of gestational diabetes: Z86.32

## 2020-03-26 HISTORY — DX: Vitamin D deficiency, unspecified: E55.9

## 2020-03-26 NOTE — Progress Notes (Signed)
Chief Complaint:   OBESITY Connie Blanchard (MR# [id number]) is a 49 y.o. female who presents for evaluation and treatment of obesity and related comorbidities. Current BMI is Body mass index is 33.99 kg/m. Brianah has been struggling with her weight for many years and has been unsuccessful in either losing weight, maintaining weight loss, or reaching her healthy weight goal.  Vada is currently in the action stage of change and ready to dedicate time achieving and maintaining a healthier weight. Chandel is interested in becoming our patient and working on intensive lifestyle modifications including (but not limited to) diet and exercise for weight loss.  Hellon is a returning patient.  When she was last here, her RMR was 2196 on 10/12/2017.  She worked mainly with Dr. Leafy Ro and was last seen in 05/2019.  She was on the Category 3 Plan.  Mylene's habits were reviewed today and are as follows: her desired weight loss is 43 pounds, she has been heavy most of her life, she started gaining weight after having children, her heaviest weight ever was 242 pounds, she craves carbs and sweets, she snacks frequently in the evenings, she wakes up frequently in the middle of the night to eat, she skips meals frequently, she is frequently drinking liquids with calories, she frequently eats larger portions than normal and she struggles with emotional eating.  Depression Screen Ashiyah's Food and Mood (modified PHQ-9) score was 18.  Depression screen PHQ 2/9 03/26/2020  Decreased Interest 1  Down, Depressed, Hopeless 1  PHQ - 2 Score 2  Altered sleeping 0  Tired, decreased energy 0  Change in appetite 1  Feeling bad or failure about yourself  0  Trouble concentrating 0  Moving slowly or fidgety/restless 0  Suicidal thoughts -  PHQ-9 Score 3  Difficult doing work/chores -   Subjective:   1. Other fatigue Dannette denies daytime somnolence and admits to waking up still tired. Patent  has a history of symptoms of morning fatigue, morning headache and snoring. Haya generally gets 5 hours of sleep per night, and states that she has poor quality sleep. Snoring is present. Apneic episodes are not present. Epworth Sleepiness Score is 3.  2. Shortness of breath on exertion Joelene Millin notes increasing shortness of breath with exercising and seems to be worsening over time with weight gain. She notes getting out of breath sooner with activity than she used to. This has gotten worse recently. Allycia denies shortness of breath at rest or orthopnea.  3. Gastroesophageal reflux disease, unspecified whether esophagitis present Emanuelle is taking Nexium daily.  She says her symptoms are controlled currently.  She says she only occasionally takes Nexium.  4. Essential hypertension- diet controlled currently She takes Maxzide most days.  When she lost weight, shd did not need it.  Lately, she has not been taking it and her blood pressure has been 122/75, 110/68, 130/80 at home.   Review: taking medications as instructed, no medication side effects noted, no chest pain on exertion, no dyspnea on exertion, no swelling of ankles.   BP Readings from Last 3 Encounters:  03/26/20 122/87  05/31/19 119/77  03/29/19 121/76   5. Vitamin D deficiency Odalys's Vitamin D level was 43.9 on 03/16/2019. She is currently taking no vitamin D supplement.  She has not taken any vitamin D supplementation since last seen here at the clinic.  6. Insulin resistance Alexzandria has a diagnosis of insulin resistance based on her elevated fasting insulin level >5. She  continues to work on diet and exercise to decrease her risk of diabetes.  She has a history of gestational diabetes.  Lab Results  Component Value Date   INSULIN 10.8 03/26/2020   INSULIN 23.4 03/16/2019   INSULIN 13.9 09/27/2018   INSULIN 16.0 06/07/2018   INSULIN 11.2 01/25/2018   Lab Results  Component Value Date   HGBA1C 5.5 03/26/2020     7. Other depression, with emotional eating Infant was on Effexor in the past.  She is on no medication currently.  She endorses anxiety and emotional eating.  Jaynie is struggling with emotional eating and using food for comfort to the extent that it is negatively impacting her health. She has been working on behavior modification techniques to help reduce her emotional eating and has been unsuccessful. She shows no sign of suicidal or homicidal ideations.  8. At risk for diabetes mellitus Gretchen is at higher than average risk for developing diabetes due to her obesity.   Assessment/Plan:   1. Other fatigue Paulla does feel that her weight is causing her energy to be lower than it should be. Fatigue may be related to obesity, depression or many other causes. Labs will be ordered, and in the meanwhile, Lamya will focus on self care including making healthy food choices, increasing physical activity and focusing on stress reduction.  - EKG 12-Lead - Vitamin B12 - CBC with Differential/Platelet - Lipid Panel With LDL/HDL Ratio - T3 - T4, free - TSH - VITAMIN D 25 Hydroxy (Vit-D Deficiency, Fractures) - Hemoglobin A1c - Insulin, random - Folate  2. Shortness of breath on exertion Sanaiyah does feel that she gets out of breath more easily that she used to when she exercises. Gelila's shortness of breath appears to be obesity related and exercise induced. She has agreed to work on weight loss and gradually increase exercise to treat her exercise induced shortness of breath. Will continue to monitor closely.  - Vitamin B12 - Lipid Panel With LDL/HDL Ratio - Folate  3. Gastroesophageal reflux disease, unspecified whether esophagitis present Check labs, prudent nutritional plan, weight loss.  Intensive lifestyle modifications are the first line treatment for this issue. We discussed several lifestyle modifications today and she will continue to work on diet, exercise and weight  loss efforts. Orders and follow up as documented in patient record.   Counseling . If a person has gastroesophageal reflux disease (GERD), food and stomach acid move back up into the esophagus and cause symptoms or problems such as damage to the esophagus. . Anti-reflux measures include: raising the head of the bed, avoiding tight clothing or belts, avoiding eating late at night, not lying down shortly after mealtime, and achieving weight loss. . Avoid ASA, NSAID's, caffeine, alcohol, and tobacco.  . OTC Pepcid and/or Tums are often very helpful for as needed use.  Marland Kitchen However, for persisting chronic or daily symptoms, stronger medications like Omeprazole may be needed. . You may need to avoid foods and drinks such as: ? Coffee and tea (with or without caffeine). ? Drinks that contain alcohol. ? Energy drinks and sports drinks. ? Bubbly (carbonated) drinks or sodas. ? Chocolate and cocoa. ? Peppermint and mint flavorings. ? Garlic and onions. ? Horseradish. ? Spicy and acidic foods. These include peppers, chili powder, curry powder, vinegar, hot sauces, and BBQ sauce. ? Citrus fruit juices and citrus fruits, such as oranges, lemons, and limes. ? Tomato-based foods. These include red sauce, chili, salsa, and pizza with red sauce. ? Maceo Pro  and fatty foods. These include donuts, french fries, potato chips, and high-fat dressings. ? High-fat meats. These include hot dogs, rib eye steak, sausage, ham, and bacon.  - Vitamin B12 - CBC with Differential/Platelet  4. Essential hypertension- diet controlled currently Jada is working on healthy weight loss and exercise to improve blood pressure control. We will watch for signs of hypotension as she continues her lifestyle modifications.  Continue home blood pressure monitoring and prudent nutritional plan and weight loss.  Check labs.  Will follow closely.  - Vitamin B12 - CBC with Differential/Platelet - Comprehensive metabolic panel - Lipid  Panel With LDL/HDL Ratio  5. Vitamin D deficiency Low Vitamin D level contributes to fatigue and are associated with obesity, breast, and colon cancer.  Check labs, prudent nutritional plan, weight loss.  Treatment if deficiency is present.  - Vitamin B12 - VITAMIN D 25 Hydroxy (Vit-D Deficiency, Fractures)  6. Insulin resistance Jennings will continue to work on weight loss, exercise, and decreasing simple carbohydrates to help decrease the risk of diabetes. Carlotta agreed to follow-up with [place] as directed to closely monitor her progress.  Check labs today, prudent nutritional plan, weight loss.  - Vitamin B12 - CBC with Differential/Platelet - T3 - T4, free - TSH - Hemoglobin A1c - Insulin, random - Folate  7. Other depression, with emotional eating Patient was referred to Dr. Mallie Mussel, our Bariatric Psychologist, for evaluation due to her elevated PHQ-9 score and significant struggles with emotional eating.  This is per patient request.  She will follow-up here or with her PCP if she feels counseling and conservative measures are not working.  Weight loss, prudent nutritional plan, and eventually increase activity.  - Vitamin B12 - Folate  9. At risk for heart disease Due to Mckinzee's current state of health and medical condition(s), they are at a significantly higher risk for heart disease.   This puts the patient at much greater risk to subsequently develop cardiopulmonary conditions that can significantly affect patient's quality of life in a negative manner as well.  At least 25+ minutes was spent on counseling Maelle about these concerns today and I stressed the importance of reversing risks factors of obesity, esp truncal and visceral fat, hypertension, hyperlipidemia, pre- and diabetes when applicable.   Initial goal is to lose at least 5-10% of starting weight to help reduce these risk factors.   Counseling: Intensive lifestyle modifications discussed with Janielle as most  appropriate first line treatment.  she will continue to work on diet, exercise and weight loss efforts.  We will continue to reassess these conditions on a fairly regular basis in an attempt to decrease patient's overall morbidity and mortality.  Evidence-based interventions for health behavior change were utilized today including the discussion of self monitoring techniques, problem-solving barriers and SMART goal setting techniques.  Specifically regarding patient's less desirable eating habits and patterns, we employed the technique of small changes when Yaritzel has not been able to fully commit to her prudent nutritional plan.  10. Class 1 obesity with serious comorbidity and body mass index (BMI) of 34.0 to 34.9 in adult, unspecified obesity type Cherre is currently in the action stage of change and her goal is to continue with weight loss efforts. I recommend Seleste begin the structured treatment plan as follows:  She has agreed to the Category 4 Plan.  Exercise goals: As is.   Behavioral modification strategies: increasing lean protein intake, decreasing simple carbohydrates, no skipping meals, meal planning and cooking strategies and  planning for success.  She was informed of the importance of frequent follow-up visits to maximize her success with intensive lifestyle modifications for her multiple health conditions. She was informed we would discuss her lab results at her next visit unless there is a critical issue that needs to be addressed sooner. Delila agreed to keep her next visit at the agreed upon time to discuss these results.  Objective:   Blood pressure 122/87, pulse 75, temperature 98.3 F (36.8 C), height 5\' 7"  (1.702 m), weight 217 lb (98.4 kg), last menstrual period 03/03/2017, SpO2 99 %. Body mass index is 33.99 kg/m.  EKG: Normal sinus rhythm, rate 75 bpm.  Indirect Calorimeter completed today shows a VO2 of 363 and a REE of 2526.  Her calculated basal metabolic  rate is 4580 thus her basal metabolic rate is better than expected.  General: Cooperative, alert, well developed, in no acute distress. HEENT: Conjunctivae and lids unremarkable. Cardiovascular: Regular rhythm.  Lungs: Normal work of breathing. Neurologic: No focal deficits.   Lab Results  Component Value Date   CREATININE 0.79 03/26/2020   BUN 12 03/26/2020   NA 141 03/26/2020   K 4.2 03/26/2020   CL 100 03/26/2020   CO2 27 03/26/2020   Lab Results  Component Value Date   ALT 12 03/26/2020   AST 15 03/26/2020   ALKPHOS 54 03/26/2020   BILITOT 0.4 03/26/2020   Lab Results  Component Value Date   HGBA1C 5.5 03/26/2020   HGBA1C 5.3 03/16/2019   HGBA1C 5.3 09/27/2018   HGBA1C 5.3 06/07/2018   HGBA1C 5.4 01/25/2018   Lab Results  Component Value Date   INSULIN 10.8 03/26/2020   INSULIN 23.4 03/16/2019   INSULIN 13.9 09/27/2018   INSULIN 16.0 06/07/2018   INSULIN 11.2 01/25/2018   Lab Results  Component Value Date   TSH 2.080 03/26/2020   Lab Results  Component Value Date   CHOL 201 (H) 03/26/2020   HDL 74 03/26/2020   LDLCALC 110 (H) 03/26/2020   TRIG 98 03/26/2020   CHOLHDL 3.7 03/16/2019   Lab Results  Component Value Date   WBC 5.3 03/26/2020   HGB 14.4 03/26/2020   HCT 44.0 03/26/2020   MCV 93 03/26/2020   PLT 231 03/26/2020   Lab Results  Component Value Date   IRON 116 05/25/2017   TIBC 397 10/29/2011   Attestation Statements:   Reviewed by clinician on day of visit: allergies, medications, problem list, medical history, surgical history, family history, social history, and previous encounter notes.  I, Water quality scientist, CMA, am acting as Location manager for [employer], DO.  I have reviewed the above documentation for accuracy and completeness, and I agree with the above. Mellody Dance, DO
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
## 2020-03-27 ENCOUNTER — Telehealth (INDEPENDENT_AMBULATORY_CARE_PROVIDER_SITE_OTHER): Payer: 59 | Admitting: Psychology

## 2020-03-27 DIAGNOSIS — F3289 Other specified depressive episodes: Secondary | ICD-10-CM

## 2020-03-27 LAB — COMPREHENSIVE METABOLIC PANEL
ALT: 12 IU/L (ref 0–32)
AST: 15 IU/L (ref 0–40)
Albumin/Globulin Ratio: 1.3 (ref 1.2–2.2)
Albumin: 4.4 g/dL (ref 3.8–4.9)
Alkaline Phosphatase: 54 IU/L (ref 48–121)
BUN/Creatinine Ratio: 15 (ref 9–23)
BUN: 12 mg/dL (ref 6–24)
Bilirubin Total: 0.4 mg/dL (ref 0.0–1.2)
CO2: 27 mmol/L (ref 20–29)
Calcium: 9.1 mg/dL (ref 8.7–10.2)
Chloride: 100 mmol/L (ref 96–106)
Creatinine, Ser: 0.79 mg/dL (ref 0.57–1.00)
GFR calc Af Amer: 100 mL/min/{1.73_m2} (ref >59)
GFR calc non Af Amer: 87 mL/min/{1.73_m2} (ref >59)
Globulin, Total: 3.3 g/dL (ref 1.5–4.5)
Glucose: 84 mg/dL (ref 65–99)
Potassium: 4.2 mmol/L (ref 3.5–5.2)
Sodium: 141 mmol/L (ref 134–144)
Total Protein: 7.7 g/dL (ref 6.0–8.5)

## 2020-03-27 LAB — CBC WITH DIFFERENTIAL/PLATELET
Basophils Absolute: 0 10*3/uL (ref 0.0–0.2)
Basos: 1 %
EOS (ABSOLUTE): 0.4 10*3/uL (ref 0.0–0.4)
Eos: 8 %
Hematocrit: 44 % (ref 34.0–46.6)
Hemoglobin: 14.4 g/dL (ref 11.1–15.9)
Immature Grans (Abs): 0 10*3/uL (ref 0.0–0.1)
Immature Granulocytes: 0 %
Lymphocytes Absolute: 1.5 10*3/uL (ref 0.7–3.1)
Lymphs: 28 %
MCH: 30.4 pg (ref 26.6–33.0)
MCHC: 32.7 g/dL (ref 31.5–35.7)
MCV: 93 fL (ref 79–97)
Monocytes Absolute: 0.5 10*3/uL (ref 0.1–0.9)
Monocytes: 10 %
Neutrophils Absolute: 2.9 10*3/uL (ref 1.4–7.0)
Neutrophils: 53 %
Platelets: 231 10*3/uL (ref 150–450)
RBC: 4.73 x10E6/uL (ref 3.77–5.28)
RDW: 12.3 % (ref 11.7–15.4)
WBC: 5.3 10*3/uL (ref 3.4–10.8)

## 2020-03-27 LAB — LIPID PANEL WITH LDL/HDL RATIO
Cholesterol, Total: 201 mg/dL — ABNORMAL HIGH (ref 100–199)
HDL: 74 mg/dL (ref >39)
LDL Chol Calc (NIH): 110 mg/dL — ABNORMAL HIGH (ref 0–99)
LDL/HDL Ratio: 1.5 ratio (ref 0.0–3.2)
Triglycerides: 98 mg/dL (ref 0–149)
VLDL Cholesterol Cal: 17 mg/dL (ref 5–40)

## 2020-03-27 LAB — FOLATE: Folate: 9.8 ng/mL (ref >3.0)

## 2020-03-27 LAB — INSULIN, RANDOM: INSULIN: 10.8 u[IU]/mL (ref 2.6–24.9)

## 2020-03-27 LAB — HEMOGLOBIN A1C
Est. average glucose Bld gHb Est-mCnc: 111 mg/dL
Hgb A1c MFr Bld: 5.5 % (ref 4.8–5.6)

## 2020-03-27 LAB — VITAMIN B12: Vitamin B-12: 455 pg/mL (ref 232–1245)

## 2020-03-27 LAB — T4, FREE: Free T4: 1.09 ng/dL (ref 0.82–1.77)

## 2020-03-27 LAB — VITAMIN D 25 HYDROXY (VIT D DEFICIENCY, FRACTURES): Vit D, 25-Hydroxy: 35.2 ng/mL (ref 30.0–100.0)

## 2020-03-27 LAB — TSH: TSH: 2.08 u[IU]/mL (ref 0.450–4.500)

## 2020-03-27 LAB — T3: T3, Total: 148 ng/dL (ref 71–180)

## 2020-03-31 ENCOUNTER — Other Ambulatory Visit: Payer: Self-pay | Admitting: Family Medicine

## 2020-04-01 DIAGNOSIS — Z9189 Other specified personal risk factors, not elsewhere classified: Secondary | ICD-10-CM | POA: Insufficient documentation

## 2020-04-01 HISTORY — DX: Other specified personal risk factors, not elsewhere classified: Z91.89

## 2020-04-02 NOTE — Progress Notes (Unsigned)
  Office: [phone]  /  Fax: [phone]    Date: April 16, 2020   Appointment Start Time: *** Duration: *** minutes Provider: Glennie Isle, Psy.D. Type of Session: Individual Therapy  Location of Patient: {gbptloc:23249} Location of Provider: Provider's Home Type of Contact: Telepsychological Visit via MyChart Video Visit  Session Content: This provider called Connie Blanchard at 10:32am as she did not present for the telepsychological appointment. A HIPAA compliant voicemail was left requesting a call back. As such, today's appointment was initiated *** minutes late.  Connie Blanchard is a 49 y.o. female presenting for a follow-up appointment to address the previously established treatment goal of increasing coping skills. Today's appointment was a telepsychological visit due to COVID-19. Connie Blanchard provided verbal consent for today's telepsychological appointment and she is aware she is responsible for securing confidentiality on her end of the session. Prior to proceeding with today's appointment, Connie Blanchard's physical location at the time of this appointment was obtained as well a phone number she could be reached at in the event of technical difficulties. Connie Blanchard and this provider participated in today's telepsychological service.   This provider conducted a brief check-in. ***Psychoeducation regarding triggers for emotional eating was provided. Connie Blanchard was provided a handout, and encouraged to utilize the handout between now and the next appointment to increase awareness of triggers and frequency. Connie Blanchard agreed. This provider also discussed behavioral strategies for specific triggers, such as placing the utensil down when conversing to avoid mindless eating. Connie Blanchard provided verbal consent during today's appointment for this provider to send *** via e-mail.  Connie Blanchard was receptive to today's appointment as evidenced by openness to sharing, responsiveness to feedback, and  {gbreceptiveness:23401}.  Mental Status Examination:  Appearance: {Appearance:22431} Behavior: {Behavior:22445} Mood: {gbmood:21757} Affect: {Affect:22436} Speech: {Speech:22432} Eye Contact: {Eye Contact:22433} Psychomotor Activity: {Motor Activity:22434} Gait: {gbgait:23404} Thought Process: {thought process:22448}  Thought Content/Perception: {disturbances:22451} Orientation: {Orientation:22437} Memory/Concentration: {gbcognition:22449} Insight/Judgment: {Insight:22446}  Interventions:  {Interventions for Progress Notes:23405}  DSM-5 Diagnosis(es): 311 (F32.8) Other Specified Depressive Disorder, Emotional Eating Behaviors  Treatment Goal & Progress: During the initial appointment with this provider, the following treatment goal was established: increase coping skills. Connie Blanchard has demonstrated progress in her goal as evidenced by {gbtxprogress:22839}. Connie Blanchard also {gbtxprogress2:22951}.  Plan: The next appointment will be scheduled in {gbweeks:21758}, which will be {gbtxmodality:23402}. The next session will focus on {Plan for Next Appointment:23400}.

## 2020-04-09 ENCOUNTER — Ambulatory Visit (INDEPENDENT_AMBULATORY_CARE_PROVIDER_SITE_OTHER): Payer: 59 | Admitting: Family Medicine

## 2020-04-09 ENCOUNTER — Encounter (INDEPENDENT_AMBULATORY_CARE_PROVIDER_SITE_OTHER): Payer: Self-pay

## 2020-04-16 ENCOUNTER — Telehealth (INDEPENDENT_AMBULATORY_CARE_PROVIDER_SITE_OTHER): Payer: Self-pay | Admitting: Psychology

## 2020-04-16 ENCOUNTER — Telehealth (INDEPENDENT_AMBULATORY_CARE_PROVIDER_SITE_OTHER): Payer: 59 | Admitting: Psychology

## 2020-04-16 NOTE — Telephone Encounter (Signed)
°  Office: [phone]  /  Fax: [phone]  Date of Call: April 16, 2020  Time of Call: 10:32am Provider: Glennie Isle, PsyD  CONTENT:This provider called Connie Blanchard to check-in as she did not present for today's MyChart Video Visit appointment at 10:30am. A HIPAA compliant voicemail was left requesting a call back. Of note, this provider stayed on the MyChart Video Visit appointment for 5 minutes prior to signing off per the clinic's grace period policy.    PLAN:  This provider will wait for Glenn to call back. No further follow-up planned by this provider.

## 2020-05-01 ENCOUNTER — Other Ambulatory Visit: Payer: Self-pay | Admitting: Family Medicine

## 2020-06-27 ENCOUNTER — Encounter: Payer: Self-pay | Admitting: Medical

## 2020-06-27 ENCOUNTER — Ambulatory Visit: Payer: 59 | Admitting: Medical

## 2020-06-27 ENCOUNTER — Other Ambulatory Visit: Payer: Self-pay

## 2020-06-27 VITALS — BP 120/80 | HR 74 | Temp 98.5°F | Resp 18 | Ht 67.0 in | Wt 234.0 lb

## 2020-06-27 DIAGNOSIS — R0789 Other chest pain: Secondary | ICD-10-CM

## 2020-06-27 DIAGNOSIS — R739 Hyperglycemia, unspecified: Secondary | ICD-10-CM

## 2020-06-27 DIAGNOSIS — R000 Tachycardia, unspecified: Secondary | ICD-10-CM

## 2020-06-27 DIAGNOSIS — I10 Essential (primary) hypertension: Secondary | ICD-10-CM

## 2020-06-27 NOTE — Progress Notes (Signed)
Subjective:    Patient ID: Connie Blanchard, female    DOB: [birth year], 49 y.o.   MRN: [id number]  HPI  Pt in for follow up from recent chest tightness and high pulse.   Pt states happened day of thanksgiving. Very busy cleaning house and cooking for thanksgiving meal. She found out she had guest that were coming early. She got very stressed and started to feel slight low level tight sensation and palpitation. Then started to feel anxious. Pt counted 180. Pt is nurse. By the time went to ED and rested pulse came down to 120.   Pt had ekg, labs which included troponin.   Pt had 2 cups of coffee which is normal for her. Pt takes zyrtec daily but no d formulation. Pt has taken zyrtec before and no reaction.  Pt was given metoprolol on dc from ED. She states got ha with metoprolol, dizziness, gi issues and vivid dreams. Just took one tablet. Then she stopped.   ekg done in ED showed rate 85. Troponin normal, metabolic panel normal and chrest xray was normal.  Pt has mild high cholesterol. Does have htn.  Pt not diabetic. No fh of mi or stroke. Pt does have hx of smoking. Quit 49 yo. Was pack a day smoker for about 16 years. Pt is going thru menopause. Started one year ago.   No jaw pain, no shoulder pain or any arm pain.       Review of Systems  Constitutional: Negative for chills, fatigue and fever.  Respiratory: Negative for cough, chest tightness, shortness of breath and wheezing.   Cardiovascular: Negative for chest pain and palpitations.  Gastrointestinal: Negative for abdominal pain and nausea.  Musculoskeletal: Negative for back pain.  Skin: Negative for rash.  Neurological: Negative for dizziness, syncope, speech difficulty, weakness, light-headedness, numbness and headaches.  Hematological: Negative for adenopathy. Does not bruise/bleed easily.  Psychiatric/Behavioral: Negative for behavioral problems, confusion and suicidal ideas. The patient is not nervous/anxious.      Past Medical History:  Diagnosis Date  . Acute bronchitis 06/24/2012  . Acute bronchitis with asthma 06/24/2012  . Allergic state 10/15/2013  . Anxiety   . Back pain 10/15/2013  . Chronic sinusitis 02/2012   current runny nose of clear drainage  . Constipation   . Dental crowns present   . Depression   . GERD (gastroesophageal reflux disease)   . H/O gestational diabetes mellitus, not currently pregnant 07/29/2011   h/o   . Headache    history of migraines  . History of blood transfusion   . Hyperglycemia 05/25/2017  . Hypertension   . Iron deficiency anemia   . Leg edema   . Obese   . PCOS (polycystic ovarian syndrome)   . Right shoulder pain 05/25/2017     Social History   Socioeconomic History  . Marital status: Married    Spouse name: Dellis Filbert  . Number of children: 4  . Years of education: Not on file  . Highest education level: Not on file  Occupational History  . Occupation: Therapist, sports  Tobacco Use  . Smoking status: Former Smoker    Packs/day: 1.00    Years: 19.00    Pack years: 19.00    Quit date: 07/28/2003    Years since quitting: 16.9  . Smokeless tobacco: Never Used  Substance and Sexual Activity  . Alcohol use: Yes    Comment: occasionally  . Drug use: No  . Sexual activity: Yes    Partners:  Male    Birth control/protection: None    Comment: lives with husband, stepson 25, son and daughter. no dietary restrictions. eating heart healthy, walking more  Other Topics Concern  . Not on file  Social History Narrative  . Not on file   Social Determinants of Health   Financial Resource Strain:   . Difficulty of Paying Living Expenses: Not on file  Food Insecurity:   . Worried About Charity fundraiser in the Last Year: Not on file  . Ran Out of Food in the Last Year: Not on file  Transportation Needs:   . Lack of Transportation (Medical): Not on file  . Lack of Transportation (Non-Medical): Not on file  Physical Activity:   . Days of Exercise per Week:  Not on file  . Minutes of Exercise per Session: Not on file  Stress:   . Feeling of Stress : Not on file  Social Connections:   . Frequency of Communication with Friends and Family: Not on file  . Frequency of Social Gatherings with Friends and Family: Not on file  . Attends Religious Services: Not on file  . Active Member of Clubs or Organizations: Not on file  . Attends Archivist Meetings: Not on file  . Marital Status: Not on file  Intimate Partner Violence:   . Fear of Current or Ex-Partner: Not on file  . Emotionally Abused: Not on file  . Physically Abused: Not on file  . Sexually Abused: Not on file    Past Surgical History:  Procedure Laterality Date  . ABDOMINAL HYSTERECTOMY  02/2017   anemia, fibroids  . COLONOSCOPY    . DILATION AND CURETTAGE OF UTERUS  1987  . NASAL SEPTOPLASTY W/ TURBINOPLASTY  03/23/2012   Procedure: NASAL SEPTOPLASTY WITH TURBINATE REDUCTION;  Surgeon: Jerrell Belfast, MD;  Location: [facility];  Service: ENT;  Laterality: Bilateral;  . ROBOTIC ASSISTED TOTAL HYSTERECTOMY WITH SALPINGECTOMY N/A 03/17/2017   Procedure: ROBOTIC ASSISTED TOTAL HYSTERECTOMY WITH SALPINGECTOMY;  Surgeon: Bobbye Charleston, MD;  Location: [facility];  Service: Gynecology;  Laterality: N/A;  . SINUS ENDO W/FUSION  03/23/2012   Procedure: ENDOSCOPIC SINUS SURGERY WITH FUSION NAVIGATION;  Surgeon: Jerrell Belfast, MD;  Location: [facility];  Service: ENT;  Laterality: Bilateral;  with fusion scan  . UPPER GI ENDOSCOPY    . WISDOM TOOTH EXTRACTION      Family History  Problem Relation Age of Onset  . Diabetes Mother        type 2  . Hypertension Mother   . Depression Mother   . Mental illness Mother        bi-polar  . Pulmonary embolism Mother   . Obesity Mother   . Heart disease Maternal Grandfather   . Stroke Maternal Grandfather   . Other Father        drug addiction  . Depression Maternal Aunt 59       suicide attempt with  pneumonia    Allergies  Allergen Reactions  . Penicillins Rash    Has patient had a PCN reaction causing immediate rash, facial/tongue/throat swelling, SOB or lightheadedness with hypotension: Unknown Has patient had a PCN reaction causing severe rash involving mucus membranes or skin necrosis: Unknown Has patient had a PCN reaction that required hospitalization: No Has patient had a PCN reaction occurring within the last 10 years: No Childhood allergy  If all of the above answers are "NO", then may proceed with Cephalosporin use.   Marland Kitchen  Sulfa Antibiotics Rash    Current Outpatient Medications on File Prior to Visit  Medication Sig Dispense Refill  . b complex vitamins tablet Take 1 tablet by mouth daily.    . cetirizine (ZYRTEC) 10 MG tablet Take 10 mg by mouth daily as needed for allergies.    Marland Kitchen esomeprazole (NEXIUM) 40 MG capsule TAKE 1 CAPSULE BY MOUTH DAILY BEFORE BREAKFAST. 90 capsule 0  . estradiol (ESTRACE) 0.5 MG tablet Take 0.5 mg by mouth daily.    Marland Kitchen KLOR-CON M20 20 MEQ tablet TAKE 1 TABLET BY MOUTH TWICE A DAY 180 tablet 1  . Multiple Vitamin (MULTIVITAMIN) tablet Take 1 tablet by mouth daily.      . SUMAtriptan (IMITREX) 50 MG tablet Take 1 tablet (50 mg total) by mouth every 2 (two) hours as needed for migraine or headache. May repeat in 2 hours if headache persists or recurs. 10 tablet 2  . triamterene-hydrochlorothiazide (MAXZIDE-25) 37.5-25 MG tablet TAKE 1 TABLET BY MOUTH EVERY DAY 90 tablet 1   No current facility-administered medications on file prior to visit.    BP 120/80 (BP Location: Right Arm, Patient Position: Sitting, Cuff Size: Large)   Pulse 74   Temp 98.5 F (36.9 C) (Oral)   Resp 18   Ht 5\' 7"  (1.702 m)   Wt 234 lb (106.1 kg)   LMP 03/03/2017 (Exact Date)   BMI 36.65 kg/m       Objective:   Physical Exam  General Mental Status- Alert. General Appearance- Not in acute distress.   Skin General: Color- Normal Color. Moisture- Normal  Moisture.   Chest and Lung Exam Auscultation: Breath Sounds:-Normal.  Cardiovascular Auscultation:Rythm- Regular. Murmurs & Other Heart Sounds:Auscultation of the heart reveals- No Murmurs.  Abdomen Inspection:-Inspeection Normal. Palpation/Percussion:Note:No mass. Palpation and Percussion of the abdomen reveal- Non Tender, Non Distended + BS, no rebound or guarding.  Neurologic Cranial Nerve exam:- CN III-XII intact(No nystagmus), symmetric smile. Strength:- 5/5 equal and symmetric strength both upper and lower extremities.  Lower ext- no pedal edema.    Assessment & Plan:  History of brief very low level chest pressure and mild to moderate tachycardia.  I when checked manually pulse high  but pulse in  ED was in moderate range then normal.  Work-up was overall negative.  EKG from ED was reviewed today.  Compared to prior EKGs no significant changes.  During office visit pulse was steadily in the 70-80 range consistently.  Would recommend that you avoid excess caffeine intake.  Make sure not taking in any decongestants.  Recommend getting pulse ox monitor or smart watch.  That way you can periodically check your pulse and see if you run tachycardic.  I want you to get future fasting lipid panel, Z8H and metabolic panel.  I want to assess your cardiac risk factors.  Unfortunately you could not tolerate beta-blocker.  Based on today's exam is not clear that that is necessary at this point.  If you have any significant chest pain, palpitations or tachycardia then recommend be seen in the emergency department.  I want you to get the fasting labs and follow-up with Dr. Charlett Blake or myself in about 10 to 14 days.  Considering possibly referring you to cardiologist but you did have some extenuating circumstances around the tachycardic event that makes me hesitate presently.  We'll follow you closely.  Mackie Pai, PA-C    Time spent with patient today was   minutes which consisted of  chart revdiew, discussing diagnosis, work  up treatment and documentation.

## 2020-06-27 NOTE — Patient Instructions (Signed)
History of brief very low level chest pressure and mild to moderate tachycardia.  I when checked manually pulse high  but pulse in  ED was in moderate range then normal.  Work-up was overall negative.  EKG from ED was reviewed today.  Compared to prior EKGs no significant changes.  During office visit pulse was steadily in the 70-80 range consistently.  Would recommend that you avoid excess caffeine intake.  Make sure not taking in any decongestants.  Recommend getting pulse ox monitor or smart watch.  That way you can periodically check your pulse and see if you run tachycardic.  I want you to get future fasting lipid panel, Z6X and metabolic panel.  I want to assess your cardiac risk factors.  Unfortunately you could not tolerate beta-blocker.  Based on today's exam is not clear that that is necessary at this point.  If you have any significant chest pain, palpitations or tachycardia then recommend be seen in the emergency department.  I want you to get the fasting labs and follow-up with Dr. Charlett Blake or myself in about 10 to 14 days.  Considering possibly referring you to cardiologist but you did have some extenuating circumstances around the tachycardic event that makes me hesitate presently.  We'll follow you closely.

## 2020-07-01 ENCOUNTER — Other Ambulatory Visit: Payer: Self-pay

## 2020-07-01 ENCOUNTER — Other Ambulatory Visit (INDEPENDENT_AMBULATORY_CARE_PROVIDER_SITE_OTHER): Payer: 59

## 2020-07-01 DIAGNOSIS — R739 Hyperglycemia, unspecified: Secondary | ICD-10-CM | POA: Diagnosis not present

## 2020-07-01 DIAGNOSIS — I10 Essential (primary) hypertension: Secondary | ICD-10-CM

## 2020-07-01 LAB — COMPREHENSIVE METABOLIC PANEL
ALT: 15 U/L (ref 0–35)
AST: 16 U/L (ref 0–37)
Albumin: 4.3 g/dL (ref 3.5–5.2)
Alkaline Phosphatase: 47 U/L (ref 39–117)
BUN: 15 mg/dL (ref 6–23)
CO2: 31 mEq/L (ref 19–32)
Calcium: 9.2 mg/dL (ref 8.4–10.5)
Chloride: 98 mEq/L (ref 96–112)
Creatinine, Ser: 0.92 mg/dL (ref 0.40–1.20)
GFR: 72.02 mL/min (ref >60.00)
Glucose, Bld: 87 mg/dL (ref 70–99)
Potassium: 4.1 mEq/L (ref 3.5–5.1)
Sodium: 139 mEq/L (ref 135–145)
Total Bilirubin: 0.7 mg/dL (ref 0.2–1.2)
Total Protein: 8 g/dL (ref 6.0–8.3)

## 2020-07-01 LAB — LIPID PANEL
Cholesterol: 228 mg/dL — ABNORMAL HIGH (ref 0–200)
HDL: 67.9 mg/dL (ref >39.00)
LDL Cholesterol: 144 mg/dL — ABNORMAL HIGH (ref 0–99)
NonHDL: 160.29
Total CHOL/HDL Ratio: 3
Triglycerides: 82 mg/dL (ref 0.0–149.0)
VLDL: 16.4 mg/dL (ref 0.0–40.0)

## 2020-07-01 LAB — HEMOGLOBIN A1C: Hgb A1c MFr Bld: 5.4 % (ref 4.6–6.5)

## 2020-07-03 ENCOUNTER — Inpatient Hospital Stay: Payer: 59 | Admitting: Family Medicine

## 2020-07-11 ENCOUNTER — Other Ambulatory Visit: Payer: Self-pay

## 2020-07-11 ENCOUNTER — Telehealth (INDEPENDENT_AMBULATORY_CARE_PROVIDER_SITE_OTHER): Payer: 59 | Admitting: Medical

## 2020-07-11 VITALS — Ht 67.0 in | Wt 230.0 lb

## 2020-07-11 DIAGNOSIS — I10 Essential (primary) hypertension: Secondary | ICD-10-CM | POA: Diagnosis not present

## 2020-07-11 DIAGNOSIS — E785 Hyperlipidemia, unspecified: Secondary | ICD-10-CM

## 2020-07-11 NOTE — Progress Notes (Signed)
° °  Subjective:    Patient ID: Connie Blanchard, female    DOB: [birth year], 49 y.o.   MRN: [id number]  HPI  Virtual Visit via Telephone Note  I connected with Connie Blanchard on 07/11/20 at 11:20 AM EST by telephone and verified that I am speaking with the correct person using two identifiers.  Location: Patient: home Provider: office  video link failed   I discussed the limitations, risks, security and privacy concerns of performing an evaluation and management service by telephone and the availability of in person appointments. I also discussed with the patient that there may be a patient responsible charge related to this service. The patient expressed understanding and agreed to proceed.  Pt did not check her vitals today. Does not have bp cuff.    History of Present Illness:   Pt in for follow up.   Pt states no recurrent chest pain. Pt states her pulse readings have been between 70-101(but higher range with activity).  When she exercise pulse max 167 after 20 minutes on ellipitical. After exercise came down quickly.  No chest pressure, palpiations or pain on exercise. No abnormal sob when exercising.   She states since I talked with her did gradual start exercise program  Pt has htn and is on triamterine/hctz.  On labs done recently cholesterol elevated. Pt started low cholesterol diet and exercise.     Observations/Objective: General- no acute distress, pleasant, alert and oriented.    Assessment and Plan: Glad to hear that you are doing well since the last visit.  No longer having any recurrent chest pressure symptoms or any abnormal high pulse.  We did do labs to evaluate cardiovascular risk factors and your cholesterol was mild to moderate elevated.  Glad to hear that you have already made diet modifications and restarted on Krill oil.  Would recommend repeating lipid panel in 3 months when you see your PCP.  You do have history of hypertension and recommend  continue triamterene/HCTZ.  Do want you to get blood pressure monitor over-the-counter and check blood pressure periodically.  Hopefully will be close to 130/80.  If you notice any levels above 140/90 please let us know.  If  any recurrent chest pressure sensation also please let us know.  In that event would refer you to cardiologist. If signficant type pain/severe  then advise ED evaluation.  Follow up in 3 months pcp or as needed. Recommend come in fasting that day so can get fasting lipid panel.  Mackie Pai, PA-C   Follow Up Instructions:    I discussed the assessment and treatment plan with the patient. The patient was provided an opportunity to ask questions and all were answered. The patient agreed with the plan and demonstrated an understanding of the instructions.   The patient was advised to call back or seek an in-person evaluation if the symptoms worsen or if the condition fails to improve as anticipated.  Time spent with patient today was 20  minutes which consisted of chart revdiew, discussing diagnosis, work up treatment and documentation.   Mackie Pai, PA-C    Review of Systems     Objective:   Physical Exam        Assessment & Plan:

## 2020-07-11 NOTE — Patient Instructions (Addendum)
Glad to hear that you are doing well since the last visit.  No longer having any recurrent chest pressure symptoms or any abnormal high pulse.  We did do labs to evaluate cardiovascular risk factors and your cholesterol was mild to moderate elevated.  Glad to hear that you have already made diet modifications and restarted on Krill oil.  Would recommend repeating lipid panel in 3 months when you see your PCP.  You do have history of hypertension and recommend continue triamterene/HCTZ.  Do want you to get blood pressure monitor over-the-counter and check blood pressure periodically.  Hopefully will be close to 130/80.  If you notice any levels above 140/90 please let us know.  If  any recurrent mild/transient chest pressure sensation  let us know.  In that event would refer you to cardiologist. If signficant type pain/severe  then advise ED evaluation.  Follow up in 3 months pcp or as needed. Recommend come in fasting that day so can get fasting lipid panel.

## 2020-08-02 IMAGING — NM NUCLEAR MEDICINE HEPATOBILIARY IMAGING WITH GALLBLADDER EF
2 series · 12 of 12 positions shown · non-contrast
Comparison: None.

CLINICAL DATA: 48-year-old female with RIGHT upper quadrant pain
and bloating

EXAM:
NUCLEAR MEDICINE HEPATOBILIARY IMAGING WITH GALLBLADDER EF
TECHNIQUE: Sequential images of the abdomen were obtained [DATE] minutes
following intravenous administration of radiopharmaceutical. After
oral ingestion of Ensure, gallbladder ejection fraction was
determined. At 60 min, normal ejection fraction is greater than 33%.
RADIOPHARMACEUTICALS:  4.9 mCi Gc-99m  Choletec IV

[he hepatobiliary · 4.52mm/px · 6 of 60 frames shown (1 of 2)]
[frame 6/60]
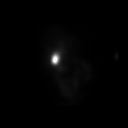
[frame 16/60]
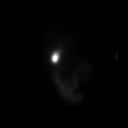
[frame 26/60]
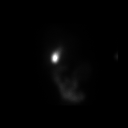
[frame 36/60]
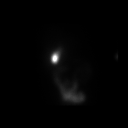
[frame 46/60]
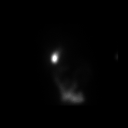
[frame 56/60]
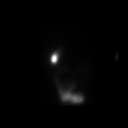

[he hepatobiliary · 4.52mm/px · 6 of 60 frames shown (2 of 2)]
[frame 6/60]
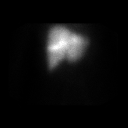
[frame 16/60]
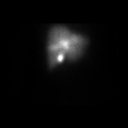
[frame 26/60]
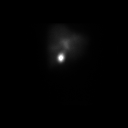
[frame 36/60]
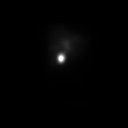
[frame 46/60]
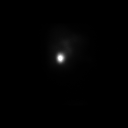
[frame 56/60]
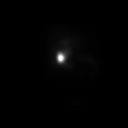

[12 of 12 positions shown; findings below may reference images not displayed]

FINDINGS: Prompt uptake and biliary excretion of activity by the liver is
seen. Gallbladder activity is visualized, consistent with patency of
cystic duct. Biliary activity passes into small bowel, consistent
with patent common bile duct.

Calculated gallbladder ejection fraction is 40%. (Normal gallbladder
ejection fraction with Ensure is greater than 33%.)
IMPRESSION: 1. Patent cystic duct and common bile duct.
2. Normal gallbladder ejection fraction.

## 2020-08-02 IMAGING — US ULTRASOUND ABDOMEN LIMITED
1 series · 14 of 25 positions shown · non-contrast
Comparison: None.

CLINICAL DATA: Right upper quadrant pain

EXAM:
ULTRASOUND ABDOMEN LIMITED RIGHT UPPER QUADRANT

[Series 1: ultrasound abdomen limited · 14 of 46 slices shown]
[im 1/46]
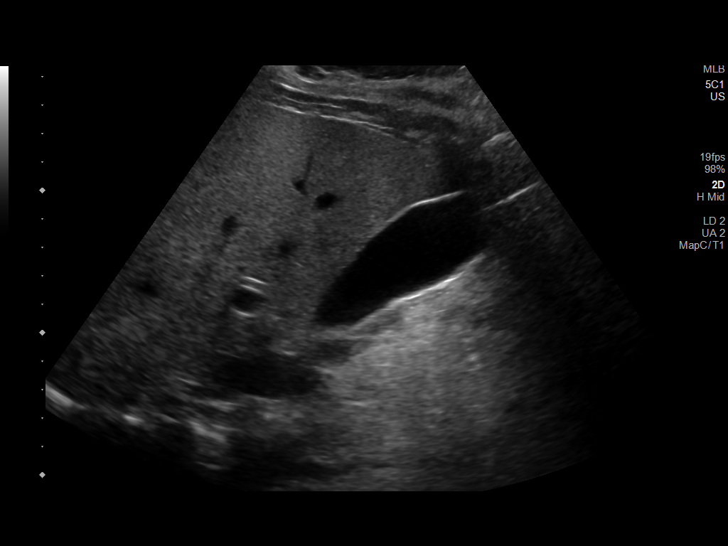
[im 4/46]
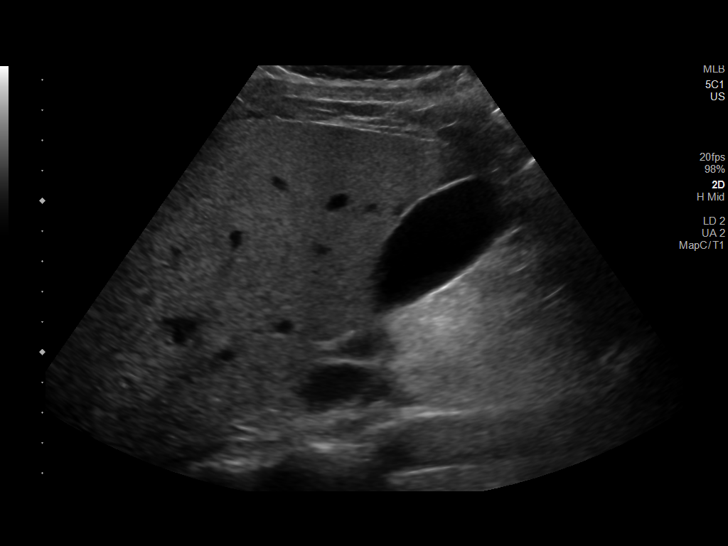
[im 8/46]
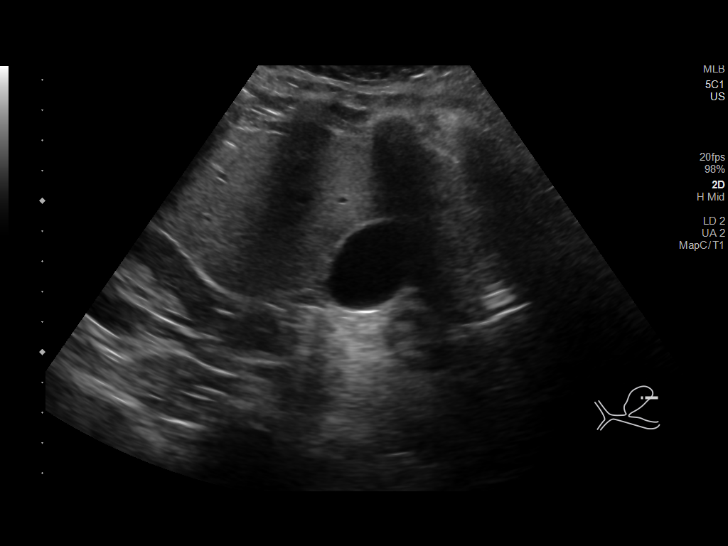
[im 12/46]
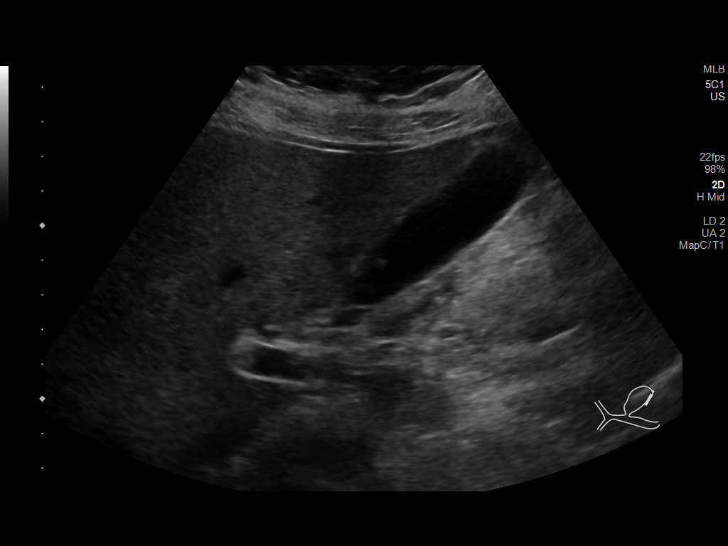
[im 16/46]
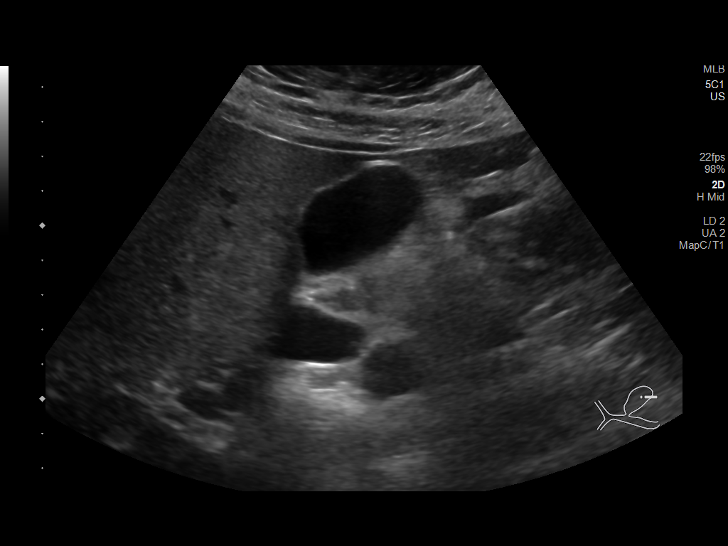
[im 17/46]
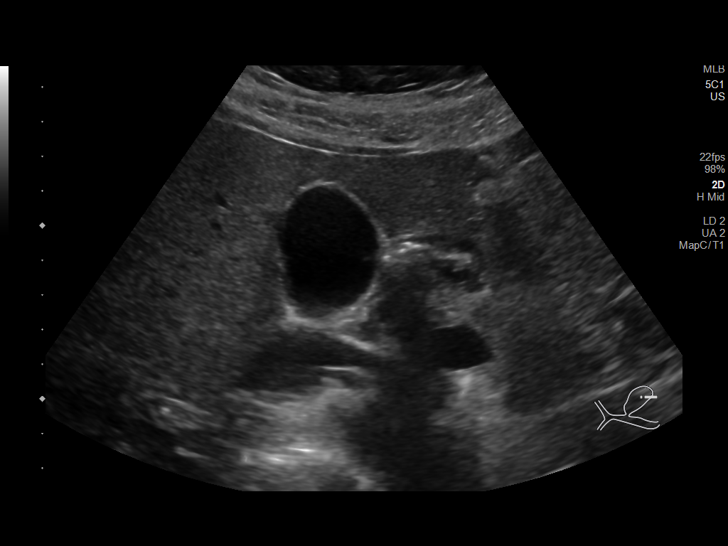
[im 21/46]
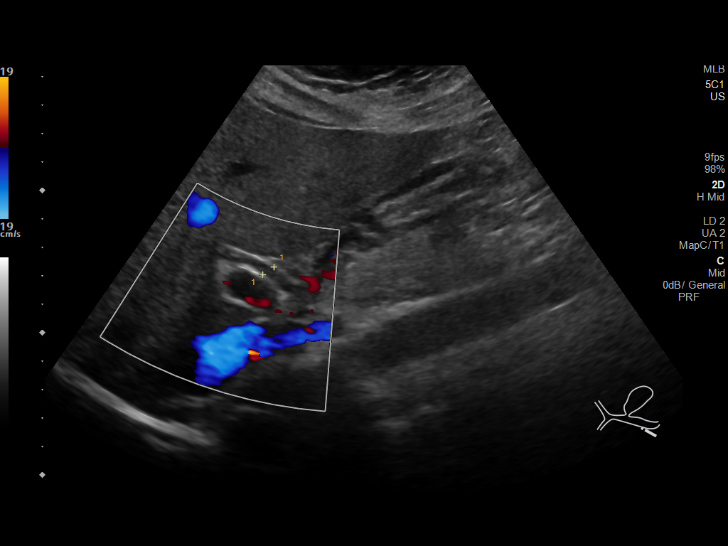
[im 25/46]
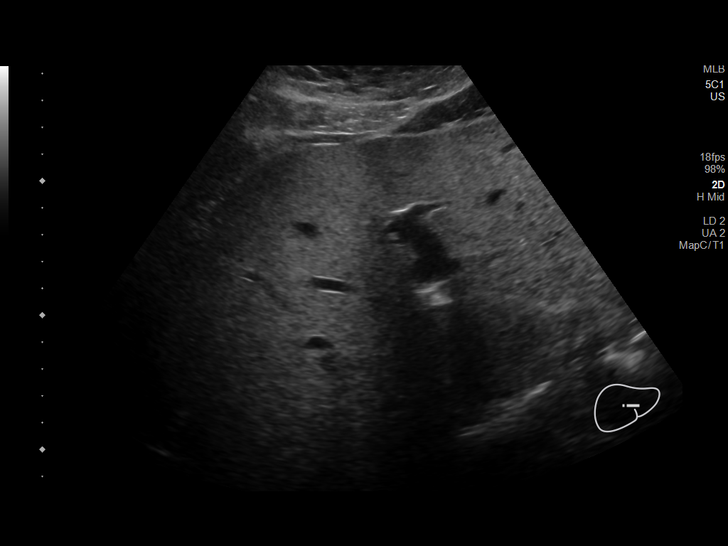
[im 29/46]
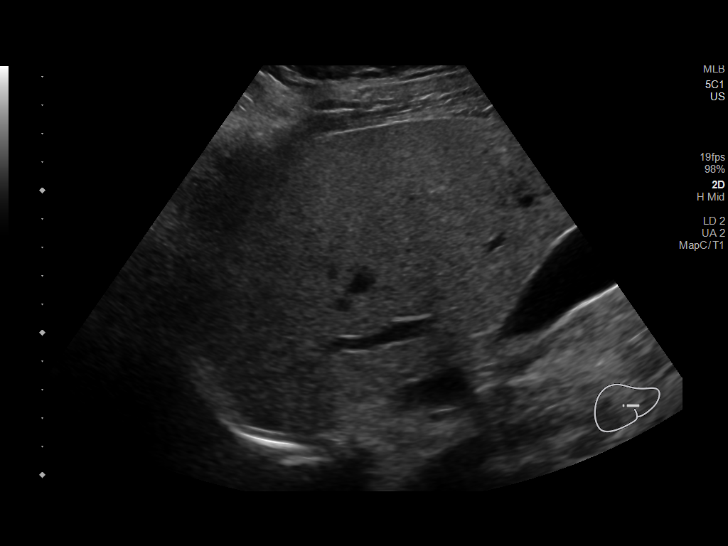
[im 31/46]
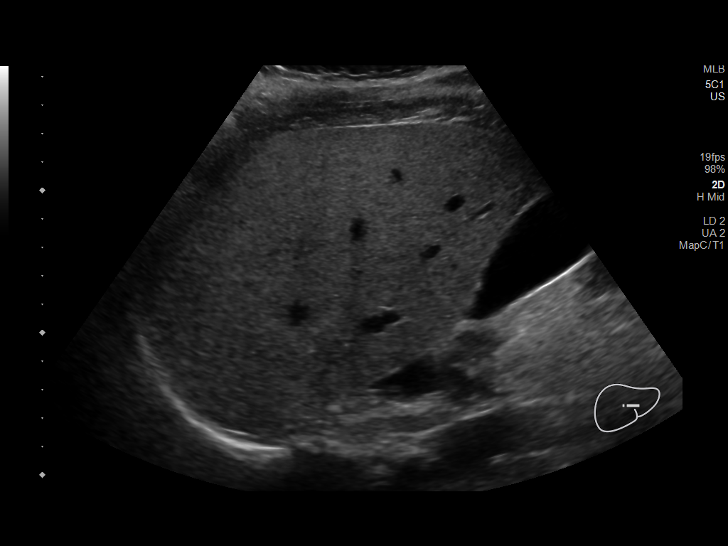
[im 34/46]
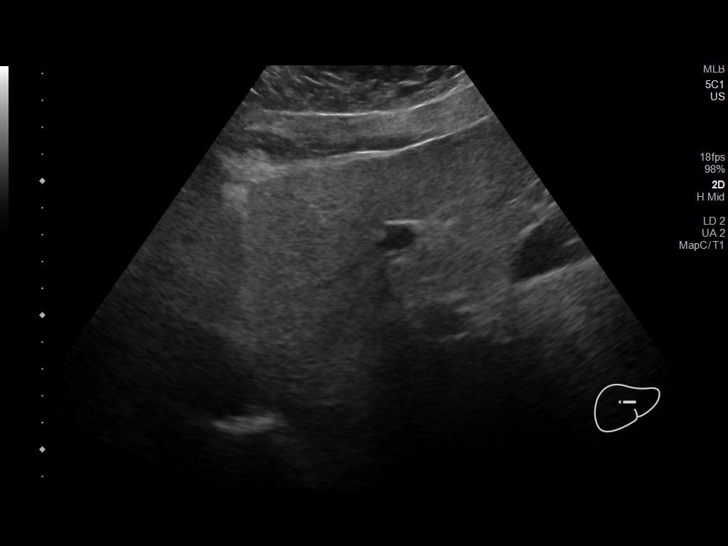
[im 38/46]
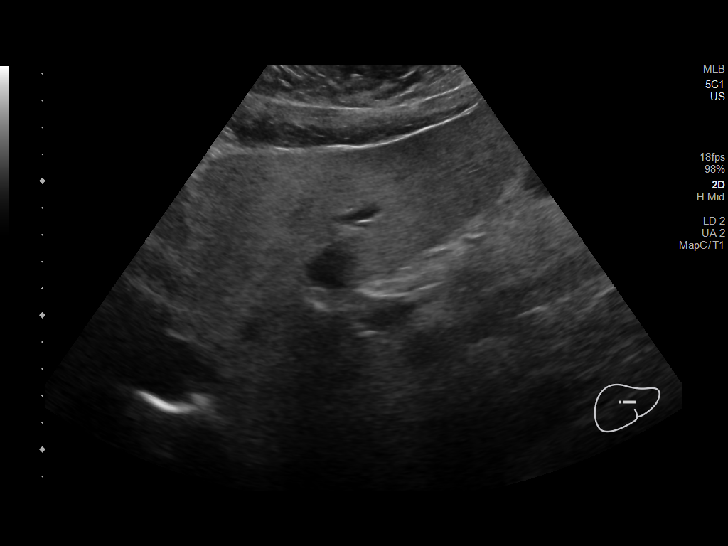
[im 42/46]
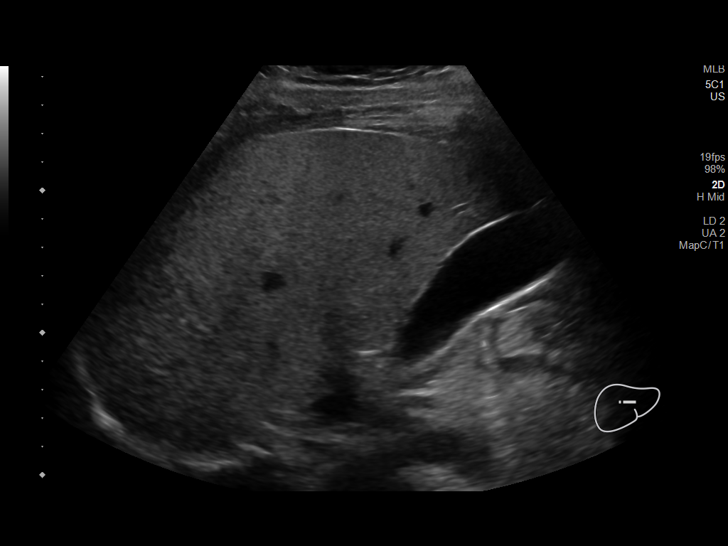
[im 46/46]
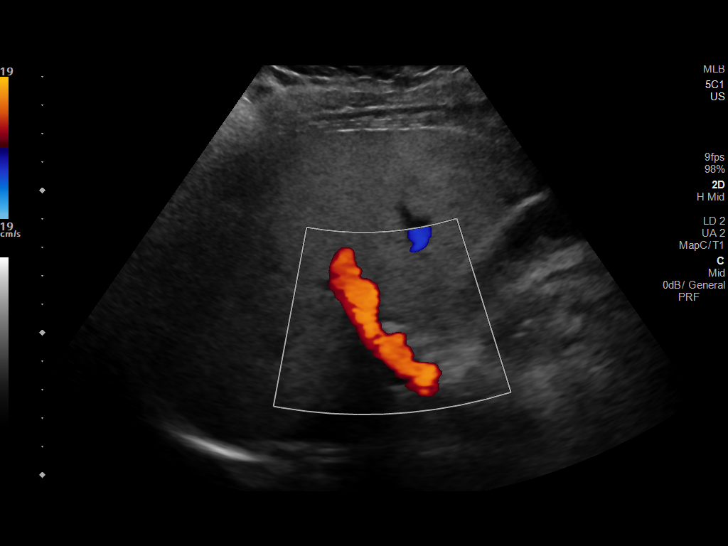

[14 of 25 positions shown; findings below may reference images not displayed]

FINDINGS: Gallbladder:

Within the gallbladder, there is a 6 mm echogenic focus which
neither moves nor shadows, an apparent polyp. There are no echogenic
foci in the gallbladder which move and shadow as is expected with
gallstones. There is no gallbladder wall thickening or
pericholecystic fluid. No sonographic Murphy sign noted by
sonographer.

Common bile duct:

Diameter: 5 mm. No intrahepatic or extrahepatic biliary duct
dilatation.

Liver:

No focal lesion identified. Within normal limits in parenchymal
echogenicity. Portal vein is patent on color Doppler imaging with
normal direction of blood flow towards the liver.
IMPRESSION: 1. 6 mm apparent polyp within the gallbladder. Per consensus
guidelines, a polyp of this size does not warrant additional imaging
surveillance. Gallbladder otherwise appears unremarkable.

2.  Study otherwise unremarkable.

## 2020-08-05 ENCOUNTER — Telehealth: Payer: Self-pay | Admitting: Medical

## 2020-08-05 ENCOUNTER — Encounter: Payer: Self-pay | Admitting: Medical

## 2020-08-05 DIAGNOSIS — R000 Tachycardia, unspecified: Secondary | ICD-10-CM

## 2020-08-05 DIAGNOSIS — R002 Palpitations: Secondary | ICD-10-CM

## 2020-08-05 NOTE — Telephone Encounter (Signed)
Referral to cardiologist placed. 

## 2020-08-06 ENCOUNTER — Other Ambulatory Visit: Payer: Self-pay

## 2020-08-06 DIAGNOSIS — R519 Headache, unspecified: Secondary | ICD-10-CM | POA: Insufficient documentation

## 2020-08-06 DIAGNOSIS — R600 Localized edema: Secondary | ICD-10-CM | POA: Insufficient documentation

## 2020-08-06 DIAGNOSIS — Z98811 Dental restoration status: Secondary | ICD-10-CM | POA: Insufficient documentation

## 2020-08-06 DIAGNOSIS — K824 Cholesterolosis of gallbladder: Secondary | ICD-10-CM

## 2020-08-06 DIAGNOSIS — D509 Iron deficiency anemia, unspecified: Secondary | ICD-10-CM | POA: Insufficient documentation

## 2020-08-06 DIAGNOSIS — R1011 Right upper quadrant pain: Secondary | ICD-10-CM

## 2020-08-06 DIAGNOSIS — Z9289 Personal history of other medical treatment: Secondary | ICD-10-CM | POA: Insufficient documentation

## 2020-08-06 DIAGNOSIS — F32A Depression, unspecified: Secondary | ICD-10-CM | POA: Insufficient documentation

## 2020-08-06 DIAGNOSIS — K5900 Constipation, unspecified: Secondary | ICD-10-CM | POA: Insufficient documentation

## 2020-08-06 DIAGNOSIS — R1032 Left lower quadrant pain: Secondary | ICD-10-CM

## 2020-08-06 DIAGNOSIS — I10 Essential (primary) hypertension: Secondary | ICD-10-CM | POA: Insufficient documentation

## 2020-08-06 DIAGNOSIS — F419 Anxiety disorder, unspecified: Secondary | ICD-10-CM | POA: Insufficient documentation

## 2020-08-06 DIAGNOSIS — R1033 Periumbilical pain: Secondary | ICD-10-CM

## 2020-08-06 HISTORY — DX: Periumbilical pain: R10.33

## 2020-08-06 HISTORY — DX: Left lower quadrant pain: R10.32

## 2020-08-06 HISTORY — DX: Cholesterolosis of gallbladder: K82.4

## 2020-08-06 HISTORY — DX: Right upper quadrant pain: R10.11

## 2020-08-07 ENCOUNTER — Ambulatory Visit: Payer: 59 | Admitting: Medical

## 2020-08-08 ENCOUNTER — Ambulatory Visit: Payer: 59 | Admitting: Cardiology

## 2020-08-20 ENCOUNTER — Encounter: Payer: Self-pay | Admitting: Medical

## 2020-08-20 ENCOUNTER — Ambulatory Visit: Payer: 59 | Admitting: Cardiology

## 2020-08-20 DIAGNOSIS — G4700 Insomnia, unspecified: Secondary | ICD-10-CM | POA: Insufficient documentation

## 2020-08-20 DIAGNOSIS — E669 Obesity, unspecified: Secondary | ICD-10-CM | POA: Insufficient documentation

## 2020-08-24 NOTE — Progress Notes (Deleted)
Cardiology Office Note:    Date:  08/24/2020   ID:  IKEA POSTEN, DOB 07/23/1971, MRN [id number]  PCP:  Mosie Lukes, MD  Cardiologist:  Shirlee More, MD   Referring MD: Mackie Pai, PA-C  ASSESSMENT:    No diagnosis found. PLAN:    In order of problems listed above:  1. ***  Next appointment   Medication Adjustments/Labs and Tests Ordered: Current medicines are reviewed at length with the patient today.  Concerns regarding medicines are outlined above.  No orders of the defined types were placed in this encounter.  No orders of the defined types were placed in this encounter.    No chief complaint on file. ***  History of Present Illness:    Connie Blanchard is a 50 y.o. female who is being seen today for the evaluation of *** at the request of [place], Percell Miller, [place].  She was seen by her PCP 06/27/2020 with complaints of chest discomfort and rapid heart rate.  She had an EKG 03/26/2020 showing sinus rhythm and was normal. CT abdominal pelvis July 2021 showed no vascular calcification. She has hyperlipidemia with LDL cholesterol of 144. Hemoglobin A1c normal 5.4%. Past Medical History:  Diagnosis Date  . Acute bronchitis 06/24/2012  . Acute bronchitis with asthma 06/24/2012  . Allergic state 10/15/2013  . Anemia 08/25/2011  . Anxiety   . Anxiety   . At risk for heart disease 04/01/2020  . Back pain 10/15/2013  . Cervical disc disease 04/22/2015  . Chronic sinusitis 02/2012   current runny nose of clear drainage  . Colon cancer screening 07/29/2011   Sees Dr Donne Hazel of dermatology in [place] of GYN for paps and [facility] and denies any concerns  . Constipation   . Dental crowns present   . Depression   . Depression with anxiety    in the past   . Essential hypertension 10/06/2015  . Gallbladder polyp 08/06/2020  . GERD (gastroesophageal reflux disease)   . H/O gestational diabetes mellitus, not currently pregnant 07/29/2011   h/o   .  Headache    history of migraines  . History of blood transfusion   . History of gestational diabetes mellitus (GDM) 03/26/2020  . Hyperglycemia 05/25/2017  . Hyperlipidemia, mixed 10/06/2015  . Hypertension   . Insomnia   . Insulin resistance 06/29/2018  . Iron deficiency anemia   . Laryngopharyngeal reflux (LPR) 02/2012   current runny nose of clear drainage  . Left lower quadrant pain 08/06/2020  . Left shoulder pain 05/26/2017  . Leg edema   . Nausea 10/15/2013  . Obese   . Obesity   . Other fatigue 10/12/2017  . PCOS (polycystic ovarian syndrome)   . Pedal edema 10/06/2015  . Periumbilical pain [id number]  . Pharyngitis 05/07/2019  . Postoperative state 03/17/2017  . Right shoulder pain 05/25/2017  . Right upper quadrant pain 08/06/2020  . Shortness of breath on exertion 10/12/2017  . Vitamin D deficiency 03/26/2020    Past Surgical History:  Procedure Laterality Date  . ABDOMINAL HYSTERECTOMY  02/2017   anemia, fibroids  . COLONOSCOPY    . DILATION AND CURETTAGE OF UTERUS  1987  . NASAL SEPTOPLASTY W/ TURBINOPLASTY  03/23/2012   Procedure: NASAL SEPTOPLASTY WITH TURBINATE REDUCTION;  Surgeon: Jerrell Belfast, MD;  Location: [place];  Service: ENT;  Laterality: Bilateral;  . ROBOTIC ASSISTED TOTAL HYSTERECTOMY WITH SALPINGECTOMY N/A 03/17/2017   Procedure: ROBOTIC ASSISTED TOTAL HYSTERECTOMY WITH SALPINGECTOMY;  Surgeon:  Bobbye Charleston, MD;  Location: [facility];  Service: Gynecology;  Laterality: N/A;  . SINUS ENDO W/FUSION  03/23/2012   Procedure: ENDOSCOPIC SINUS SURGERY WITH FUSION NAVIGATION;  Surgeon: Jerrell Belfast, MD;  Location: [place];  Service: ENT;  Laterality: Bilateral;  with fusion scan  . UPPER GI ENDOSCOPY    . WISDOM TOOTH EXTRACTION      Current Medications: No outpatient medications have been marked as taking for the 08/26/20 encounter (Appointment) with Richardo Priest, MD.     Allergies:   Sulfa antibiotics and  Penicillins   Social History   Socioeconomic History  . Marital status: Married    Spouse name: Connie Blanchard  . Number of children: 4  . Years of education: Not on file  . Highest education level: Not on file  Occupational History  . Occupation: Therapist, sports  Tobacco Use  . Smoking status: Former Smoker    Packs/day: 1.00    Years: 19.00    Pack years: 19.00    Quit date: 07/28/2003    Years since quitting: 17.0  . Smokeless tobacco: Never Used  Substance and Sexual Activity  . Alcohol use: Yes    Comment: occasionally  . Drug use: No  . Sexual activity: Yes    Partners: Male    Birth control/protection: None    Comment: lives with husband, stepson 64, son and daughter. no dietary restrictions. eating heart healthy, walking more  Other Topics Concern  . Not on file  Social History Narrative  . Not on file   Social Determinants of Health   Financial Resource Strain: Not on file  Food Insecurity: Not on file  Transportation Needs: Not on file  Physical Activity: Not on file  Stress: Not on file  Social Connections: Not on file     Family History: The patient's ***family history includes Depression in her mother; Depression (age of onset: 50) in her maternal aunt; Diabetes in her mother; Heart disease in her maternal grandfather; Hypertension in her mother; Mental illness in her mother; Obesity in her mother; Other in her father; Pulmonary embolism in her mother; Stroke in her maternal grandfather.  ROS:   ROS Please see the history of present illness.    *** All other systems reviewed and are negative.  EKGs/Labs/Other Studies Reviewed:    The following studies were reviewed today: ***  EKG:  EKG is *** ordered today.  The ekg ordered today is personally reviewed and demonstrates ***  Recent Labs: 03/26/2020: Hemoglobin 14.4; Platelets 231; TSH 2.080 07/01/2020: ALT 15; BUN 15; Creatinine, Ser 0.92; Potassium 4.1; Sodium 139  Recent Lipid Panel    Component Value Date/Time    CHOL 228 (H) 07/01/2020 1016   CHOL 201 (H) 03/26/2020 1123   TRIG 82.0 07/01/2020 1016   HDL 67.90 07/01/2020 1016   HDL 74 03/26/2020 1123   CHOLHDL 3 07/01/2020 1016   VLDL 16.4 07/01/2020 1016   LDLCALC 144 (H) 07/01/2020 1016   LDLCALC 110 (H) 03/26/2020 1123    Physical Exam:    VS:  LMP 03/03/2017 (Exact Date)     Wt Readings from Last 3 Encounters:  07/11/20 230 lb (104.3 kg)  06/27/20 234 lb (106.1 kg)  03/26/20 217 lb (98.4 kg)     GEN: *** Well nourished, well developed in no acute distress HEENT: Normal NECK: No JVD; No carotid bruits LYMPHATICS: No lymphadenopathy CARDIAC: ***RRR, no murmurs, rubs, gallops RESPIRATORY:  Clear to auscultation without rales, wheezing or rhonchi  ABDOMEN: Soft, non-tender, non-distended MUSCULOSKELETAL:  No edema; No deformity  SKIN: Warm and dry NEUROLOGIC:  Alert and oriented x 3 PSYCHIATRIC:  Normal affect     Signed, Shirlee More, MD  08/24/2020 2:34 PM    [place]

## 2020-08-26 ENCOUNTER — Ambulatory Visit: Payer: 59 | Admitting: Cardiology

## 2020-09-13 ENCOUNTER — Ambulatory Visit: Payer: 59 | Admitting: Cardiology

## 2020-09-13 ENCOUNTER — Other Ambulatory Visit: Payer: Self-pay

## 2020-09-13 ENCOUNTER — Encounter: Payer: Self-pay | Admitting: Cardiology

## 2020-09-13 VITALS — BP 110/80 | HR 81 | Ht 67.0 in | Wt 228.0 lb

## 2020-09-13 DIAGNOSIS — R0602 Shortness of breath: Secondary | ICD-10-CM | POA: Diagnosis not present

## 2020-09-13 DIAGNOSIS — Z6835 Body mass index (BMI) 35.0-35.9, adult: Secondary | ICD-10-CM | POA: Insufficient documentation

## 2020-09-13 DIAGNOSIS — E782 Mixed hyperlipidemia: Secondary | ICD-10-CM | POA: Insufficient documentation

## 2020-09-13 DIAGNOSIS — R002 Palpitations: Secondary | ICD-10-CM

## 2020-09-13 DIAGNOSIS — E669 Obesity, unspecified: Secondary | ICD-10-CM | POA: Insufficient documentation

## 2020-09-13 DIAGNOSIS — F419 Anxiety disorder, unspecified: Secondary | ICD-10-CM

## 2020-09-13 DIAGNOSIS — O24419 Gestational diabetes mellitus in pregnancy, unspecified control: Secondary | ICD-10-CM | POA: Diagnosis not present

## 2020-09-13 DIAGNOSIS — I10 Essential (primary) hypertension: Secondary | ICD-10-CM

## 2020-09-13 MED ORDER — PROPRANOLOL HCL 20 MG PO TABS: 20.0000 mg | ORAL_TABLET | Freq: Three times a day (TID) | ORAL | 3 refills | Status: DC | PRN

## 2020-09-13 NOTE — Progress Notes (Signed)
Cardiology Office Note:    Date:  09/13/2020   ID:  [name], DOB 10-Jan-1971, MRN [id number]  PCP:  Mosie Lukes, MD  Cardiologist:  Berniece Salines, DO  Electrophysiologist:  None   Referring MD: Elise Benne   Chief Complaint  Patient presents with  . Palpitations    History of Present Illness:    Connie Blanchard is a 50 y.o. female with a hx of anxiety, migraine headaches, hypertension is here today to be evaluated for intermittent palpitations and is of breath.  The patient tells me that initially started on Thanksgiving day she was hosting dinner she felt significantly anxious because this was her first time.  During the time of her cooking she fell chest palpitation chest tightness and noted that her heart rate initially was 180 and then she tried to check it went up to 190.  After she has some gets over she proceeded to the emergency department to be evaluated.  While in the emergency department she noticed that her heart rate had come down to 121 she was taken back this was confirmed show sinus tach 120.  All cardiac enzymes were normal EKG was normal she was asked to go home to follow-up with her PCP.  She did well until this past Sunday where she was talking about some family issues she got really stressed she does have a pulse ox she placed on her finger her oxygen level was fine but she had heart rate in the 190s.  It continued to stay up that way and the palpitations were she ended up bearing down and within a few seconds that have resolved.  She did not pass out on any of these episodes.  But she has shortness of breath.  Past Medical History:  Diagnosis Date  . Acute bronchitis 06/24/2012  . Acute bronchitis with asthma 06/24/2012  . Allergic state 10/15/2013  . Anemia 08/25/2011  . Anxiety   . Anxiety   . At risk for heart disease 04/01/2020  . Back pain 10/15/2013  . Cervical disc disease 04/22/2015  . Chronic sinusitis 02/2012   current runny nose of clear  drainage  . Colon cancer screening 07/29/2011   Sees Dr Donne Hazel of dermatology in [place] of GYN for paps and [facility] and denies any concerns  . Constipation   . Dental crowns present   . Depression   . Depression with anxiety    in the past   . Essential hypertension 10/06/2015  . Gallbladder polyp 08/06/2020  . GERD (gastroesophageal reflux disease)   . H/O gestational diabetes mellitus, not currently pregnant 07/29/2011   h/o   . Headache    history of migraines  . History of blood transfusion   . History of gestational diabetes mellitus (GDM) 03/26/2020  . Hyperglycemia 05/25/2017  . Hyperlipidemia, mixed 10/06/2015  . Hypertension   . Insomnia   . Insulin resistance 06/29/2018  . Iron deficiency anemia   . Laryngopharyngeal reflux (LPR) 02/2012   current runny nose of clear drainage  . Left lower quadrant pain 08/06/2020  . Left shoulder pain 05/26/2017  . Leg edema   . Nausea 10/15/2013  . Obese   . Obesity   . Other fatigue 10/12/2017  . PCOS (polycystic ovarian syndrome)   . Pedal edema 10/06/2015  . Periumbilical pain 12/31/3014  . Pharyngitis 05/07/2019  . Postoperative state 03/17/2017  . Right shoulder pain 05/25/2017  . Right upper quadrant pain 08/06/2020  .  Shortness of breath on exertion 10/12/2017  . Vitamin D deficiency 03/26/2020    Past Surgical History:  Procedure Laterality Date  . ABDOMINAL HYSTERECTOMY  02/2017   anemia, fibroids  . COLONOSCOPY    . DILATION AND CURETTAGE OF UTERUS  1987  . NASAL SEPTOPLASTY W/ TURBINOPLASTY  03/23/2012   Procedure: NASAL SEPTOPLASTY WITH TURBINATE REDUCTION;  Surgeon: Jerrell Belfast, MD;  Location: [place];  Service: ENT;  Laterality: Bilateral;  . ROBOTIC ASSISTED TOTAL HYSTERECTOMY WITH SALPINGECTOMY N/A 03/17/2017   Procedure: ROBOTIC ASSISTED TOTAL HYSTERECTOMY WITH SALPINGECTOMY;  Surgeon: Bobbye Charleston, MD;  Location: [facility];  Service: Gynecology;  Laterality: N/A;  . SINUS  ENDO W/FUSION  03/23/2012   Procedure: ENDOSCOPIC SINUS SURGERY WITH FUSION NAVIGATION;  Surgeon: Jerrell Belfast, MD;  Location: [place];  Service: ENT;  Laterality: Bilateral;  with fusion scan  . UPPER GI ENDOSCOPY    . WISDOM TOOTH EXTRACTION      Current Medications: Current Meds  Medication Sig  . cetirizine (ZYRTEC) 10 MG tablet Take 10 mg by mouth daily as needed for allergies.  Marland Kitchen esomeprazole (NEXIUM) 40 MG capsule TAKE 1 CAPSULE BY MOUTH DAILY BEFORE BREAKFAST.  Marland Kitchen estradiol (ESTRACE) 0.5 MG tablet Take 0.5 mg by mouth daily.  . Multiple Vitamin (MULTIVITAMIN) tablet Take 1 tablet by mouth daily.  . potassium chloride SA (KLOR-CON) 20 MEQ tablet Take 20 mEq by mouth daily.  . propranolol (INDERAL) 20 MG tablet Take 1 tablet (20 mg total) by mouth every 8 (eight) hours as needed (palpations, heart rate greater than 130).  . SUMAtriptan (IMITREX) 50 MG tablet Take 1 tablet (50 mg total) by mouth every 2 (two) hours as needed for migraine or headache. May repeat in 2 hours if headache persists or recurs.  . triamterene-hydrochlorothiazide (MAXZIDE-25) 37.5-25 MG tablet TAKE 1 TABLET BY MOUTH EVERY DAY  . [DISCONTINUED] KLOR-CON M20 20 MEQ tablet TAKE 1 TABLET BY MOUTH TWICE A DAY (Patient taking differently: Take 20 mEq by mouth daily.)     Allergies:   Sulfa antibiotics and Penicillins   Social History   Socioeconomic History  . Marital status: Married    Spouse name: Dellis Filbert  . Number of children: 4  . Years of education: Not on file  . Highest education level: Not on file  Occupational History  . Occupation: Therapist, sports  Tobacco Use  . Smoking status: Former Smoker    Packs/day: 1.00    Years: 19.00    Pack years: 19.00    Quit date: 07/28/2003    Years since quitting: 17.1  . Smokeless tobacco: Never Used  Substance and Sexual Activity  . Alcohol use: Yes    Comment: occasionally  . Drug use: No  . Sexual activity: Yes    Partners: Male    Birth  control/protection: None    Comment: lives with husband, stepson 74, son and daughter. no dietary restrictions. eating heart healthy, walking more  Other Topics Concern  . Not on file  Social History Narrative  . Not on file   Social Determinants of Health   Financial Resource Strain: Not on file  Food Insecurity: Not on file  Transportation Needs: Not on file  Physical Activity: Not on file  Stress: Not on file  Social Connections: Not on file     Family History: The patient's family history includes Depression in her mother; Depression (age of onset: 71) in her maternal aunt; Diabetes in her mother; Heart disease in  her maternal grandfather; Hypertension in her mother; Mental illness in her mother; Obesity in her mother; Other in her father; Pulmonary embolism in her mother; Stroke in her maternal grandfather.  ROS:   Review of Systems  Constitution: Negative for decreased appetite, fever and weight gain.  HENT: Negative for congestion, ear discharge, hoarse voice and sore throat.   Eyes: Negative for discharge, redness, vision loss in right eye and visual halos.  Cardiovascular: Negative for chest pain, dyspnea on exertion, leg swelling, orthopnea and palpitations.  Respiratory: Negative for cough, hemoptysis, shortness of breath and snoring.   Endocrine: Negative for heat intolerance and polyphagia.  Hematologic/Lymphatic: Negative for bleeding problem. Does not bruise/bleed easily.  Skin: Negative for flushing, nail changes, rash and suspicious lesions.  Musculoskeletal: Negative for arthritis, joint pain, muscle cramps, myalgias, neck pain and stiffness.  Gastrointestinal: Negative for abdominal pain, bowel incontinence, diarrhea and excessive appetite.  Genitourinary: Negative for decreased libido, genital sores and incomplete emptying.  Neurological: Negative for brief paralysis, focal weakness, headaches and loss of balance.  Psychiatric/Behavioral: Negative for altered  mental status, depression and suicidal ideas.  Allergic/Immunologic: Negative for HIV exposure and persistent infections.    EKGs/Labs/Other Studies Reviewed:    The following studies were reviewed today:   EKG:  The ekg ordered today demonstrates sinus rhythm, heart rate 81 bpm.  Recent Labs: 03/26/2020: Hemoglobin 14.4; Platelets 231; TSH 2.080 07/01/2020: ALT 15; BUN 15; Creatinine, Ser 0.92; Potassium 4.1; Sodium 139  Recent Lipid Panel    Component Value Date/Time   CHOL 228 (H) 07/01/2020 1016   CHOL 201 (H) 03/26/2020 1123   TRIG 82.0 07/01/2020 1016   HDL 67.90 07/01/2020 1016   HDL 74 03/26/2020 1123   CHOLHDL 3 07/01/2020 1016   VLDL 16.4 07/01/2020 1016   LDLCALC 144 (H) 07/01/2020 1016   LDLCALC 110 (H) 03/26/2020 1123    Physical Exam:    VS:  BP 110/80 (BP Location: Left Arm, Patient Position: Sitting, Cuff Size: Large)   Pulse 81   Ht 5\' 7"  (1.702 m)   Wt 228 lb (103.4 kg)   LMP 03/03/2017 (Exact Date)   SpO2 97%   BMI 35.71 kg/m     Wt Readings from Last 3 Encounters:  09/13/20 228 lb (103.4 kg)  07/11/20 230 lb (104.3 kg)  06/27/20 234 lb (106.1 kg)     GEN: Well nourished, well developed in no acute distress HEENT: Normal NECK: No JVD; No carotid bruits LYMPHATICS: No lymphadenopathy CARDIAC: S1S2 noted,RRR, no murmurs, rubs, gallops RESPIRATORY:  Clear to auscultation without rales, wheezing or rhonchi  ABDOMEN: Soft, non-tender, non-distended, +bowel sounds, no guarding. EXTREMITIES: No edema, No cyanosis, no clubbing MUSCULOSKELETAL:  No deformity  SKIN: Warm and dry NEUROLOGIC:  Alert and oriented x 3, non-focal PSYCHIATRIC:  Normal affect, good insight  ASSESSMENT:    1. Palpitations   2. Shortness of breath   3. Anxiety   4. History of gestational diabetes   5. Obesity (BMI 30-39.9)   6. Hypertension, unspecified type   7. Mixed hyperlipidemia    PLAN:     She is having these rare incidence of abrupt palpitations and  recently was able to be aborted by Valsalva maneuver.  I am suspecting that this may be SVT.  It is so rare and seldom.  I discussed with the patient she is going to get Kardiamobile to help track this and be able to monitor her heart during these episodes.  Using a ZIO monitor or  event monitor may not really help as the patient does have long time at least 2 months in between her episodes.  In the meantime though I am going to start her on as needed propanolol for heart rate greater than 130 during these episodes.  She is in agreement with the plan.  She has had shortness of breath and she is CAD long-term hypertension I think an echocardiogram and understanding is the patient does have some grade of diastolic dysfunction is important here because it does affect her long-term prognosis.  We will get blood work which will include BMP, mag and TSH.  The patient understands the need to lose weight with diet and exercise. We have discussed specific strategies for this.  The patient is in agreement with the above plan. The patient left the office in stable condition.  The patient will follow up in 3 months or sooner if needed.   Medication Adjustments/Labs and Tests Ordered: Current medicines are reviewed at length with the patient today.  Concerns regarding medicines are outlined above.  Orders Placed This Encounter  Procedures  . Basic metabolic panel  . Magnesium  . TSH  . EKG 12-Lead  . ECHOCARDIOGRAM COMPLETE   Meds ordered this encounter  Medications  . propranolol (INDERAL) 20 MG tablet    Sig: Take 1 tablet (20 mg total) by mouth every 8 (eight) hours as needed (palpations, heart rate greater than 130).    Dispense:  90 tablet    Refill:  3    Patient Instructions  Medication Instructions:  Your physician has recommended you make the following change in your medication:  START: Propranolol 20 mg every 8 hours as needed for palpations, Heart rate greater than 130 *If you need a  refill on your cardiac medications before your next appointment, please call your pharmacy*   Lab Work: Your physician recommends that you return for lab work: [place], TSH  If you have labs (blood work) drawn today and your tests are completely normal, you will receive your results only by: Marland Kitchen MyChart Message (if you have MyChart) OR . A paper copy in the mail If you have any lab test that is abnormal or we need to change your treatment, we will call you to review the results.   Testing/Procedures: Your physician has requested that you have an echocardiogram. Echocardiography is a painless test that uses sound waves to create images of your heart. It provides your doctor with information about the size and shape of your heart and how well your heart's chambers and valves are working. This procedure takes approximately one hour. There are no restrictions for this procedure.   Follow-Up: At [facility], you and your health needs are our priority.  As part of our continuing mission to provide you with exceptional heart care, we have created designated Provider Care Teams.  These Care Teams include your primary Cardiologist (physician) and Advanced Practice Providers (APPs -  Physician Assistants and Nurse Practitioners) who all work together to provide you with the care you need, when you need it.  We recommend signing up for the patient portal called "MyChart".  Sign up information is provided on this After Visit Summary.  MyChart is used to connect with patients for Virtual Visits (Telemedicine).  Patients are able to view lab/test results, encounter notes, upcoming appointments, etc.  Non-urgent messages can be sent to your provider as well.   To learn more about what you can do with MyChart, go to  [email or web address].    Your next appointment:   6 month(s)  The format for your next appointment:   In Person  Provider:   Berniece Salines, DO   Other Instructions       Adopting a Healthy Lifestyle.  Know what a healthy weight is for you (roughly BMI <25) and aim to maintain this   Aim for 7+ servings of fruits and vegetables daily   65-80+ fluid ounces of water or unsweet tea for healthy kidneys   Limit to max 1 drink of alcohol per day; avoid smoking/tobacco   Limit animal fats in diet for cholesterol and heart health - choose grass fed whenever available   Avoid highly processed foods, and foods high in saturated/trans fats   Aim for low stress - take time to unwind and care for your mental health   Aim for 150 min of moderate intensity exercise weekly for heart health, and weights twice weekly for bone health   Aim for 7-9 hours of sleep daily   When it comes to diets, agreement about the perfect plan isnt easy to find, even among the experts. Experts at the [place] developed an idea known as the Healthy Eating Plate. Just imagine a plate divided into logical, healthy portions.   The emphasis is on diet quality:   Load up on vegetables and fruits - one-half of your plate: Aim for color and variety, and remember that potatoes dont count.   Go for whole grains - one-quarter of your plate: Whole wheat, barley, wheat berries, quinoa, oats, brown rice, and foods made with them. If you want pasta, go with whole wheat pasta.   Protein power - one-quarter of your plate: Fish, chicken, beans, and nuts are all healthy, versatile protein sources. Limit red meat.   The diet, however, does go beyond the plate, offering a few other suggestions.   Use healthy plant oils, such as olive, canola, soy, corn, sunflower and peanut. Check the labels, and avoid partially hydrogenated oil, which have unhealthy trans fats.   If youre thirsty, drink water. Coffee and tea are good in moderation, but skip sugary drinks and limit milk and dairy products to one or two daily servings.   The type of carbohydrate in the diet is more important  than the amount. Some sources of carbohydrates, such as vegetables, fruits, whole grains, and beans-are healthier than others.   Finally, stay active  Signed, Berniece Salines, DO  09/13/2020 1:57 PM    [facility]

## 2020-09-13 NOTE — Patient Instructions (Signed)
Medication Instructions:  Your physician has recommended you make the following change in your medication:  START: Propranolol 20 mg every 8 hours as needed for palpations, Heart rate greater than 130 *If you need a refill on your cardiac medications before your next appointment, please call your pharmacy*   Lab Work: Your physician recommends that you return for lab work: Potter, TSH  If you have labs (blood work) drawn today and your tests are completely normal, you will receive your results only by: Marland Kitchen MyChart Message (if you have MyChart) OR . A paper copy in the mail If you have any lab test that is abnormal or we need to change your treatment, we will call you to review the results.   Testing/Procedures: Your physician has requested that you have an echocardiogram. Echocardiography is a painless test that uses sound waves to create images of your heart. It provides your doctor with information about the size and shape of your heart and how well your heart's chambers and valves are working. This procedure takes approximately one hour. There are no restrictions for this procedure.   Follow-Up: At [facility], you and your health needs are our priority.  As part of our continuing mission to provide you with exceptional heart care, we have created designated Provider Care Teams.  These Care Teams include your primary Cardiologist (physician) and Advanced Practice Providers (APPs -  Physician Assistants and Nurse Practitioners) who all work together to provide you with the care you need, when you need it.  We recommend signing up for the patient portal called "MyChart".  Sign up information is provided on this After Visit Summary.  MyChart is used to connect with patients for Virtual Visits (Telemedicine).  Patients are able to view lab/test results, encounter notes, upcoming appointments, etc.  Non-urgent messages can be sent to your provider as well.   To learn more about what  you can do with MyChart, go to [email or web address].    Your next appointment:   6 month(s)  The format for your next appointment:   In Person  Provider:   Berniece Salines, DO   Other Instructions

## 2020-09-14 LAB — BASIC METABOLIC PANEL
BUN/Creatinine Ratio: 12 (ref 9–23)
BUN: 12 mg/dL (ref 6–24)
CO2: 24 mmol/L (ref 20–29)
Calcium: 9.8 mg/dL (ref 8.7–10.2)
Chloride: 96 mmol/L (ref 96–106)
Creatinine, Ser: 0.97 mg/dL (ref 0.57–1.00)
GFR calc Af Amer: 78 mL/min/{1.73_m2} (ref >59)
GFR calc non Af Amer: 68 mL/min/{1.73_m2} (ref >59)
Glucose: 93 mg/dL (ref 65–99)
Potassium: 3.8 mmol/L (ref 3.5–5.2)
Sodium: 136 mmol/L (ref 134–144)

## 2020-09-14 LAB — TSH: TSH: 2.12 u[IU]/mL (ref 0.450–4.500)

## 2020-09-14 LAB — MAGNESIUM: Magnesium: 2.1 mg/dL (ref 1.6–2.3)

## 2020-09-16 ENCOUNTER — Telehealth: Payer: Self-pay | Admitting: Cardiology

## 2020-09-16 NOTE — Telephone Encounter (Signed)
Patient informed of results.  

## 2020-09-16 NOTE — Telephone Encounter (Signed)
Pt called in returning [facility] call for lab results  Best cb number  [place]

## 2020-09-23 ENCOUNTER — Other Ambulatory Visit: Payer: Self-pay | Admitting: Surgery

## 2020-10-11 ENCOUNTER — Ambulatory Visit (HOSPITAL_BASED_OUTPATIENT_CLINIC_OR_DEPARTMENT_OTHER)
Admission: RE | Admit: 2020-10-11 | Discharge: 2020-10-11 | Disposition: A | Discharge status: Home / Self Care | Payer: 59 | Source: Ambulatory Visit | Attending: Cardiology | Admitting: Cardiology

## 2020-10-11 ENCOUNTER — Other Ambulatory Visit: Payer: Self-pay

## 2020-10-11 DIAGNOSIS — F419 Anxiety disorder, unspecified: Secondary | ICD-10-CM | POA: Diagnosis present

## 2020-10-11 DIAGNOSIS — R0602 Shortness of breath: Secondary | ICD-10-CM | POA: Diagnosis not present

## 2020-10-11 DIAGNOSIS — R002 Palpitations: Secondary | ICD-10-CM | POA: Diagnosis not present

## 2020-10-11 LAB — ECHOCARDIOGRAM COMPLETE
Area-P 1/2: 3.5 cm2
S' Lateral: 2.69 cm

## 2020-11-24 ENCOUNTER — Other Ambulatory Visit: Payer: Self-pay | Admitting: Family Medicine

## 2020-12-09 ENCOUNTER — Other Ambulatory Visit: Payer: Self-pay | Admitting: Cardiology

## 2021-01-13 ENCOUNTER — Ambulatory Visit: Payer: 59 | Admitting: Podiatry

## 2021-01-13 ENCOUNTER — Encounter: Payer: Self-pay | Admitting: Podiatry

## 2021-01-13 ENCOUNTER — Other Ambulatory Visit: Payer: Self-pay

## 2021-01-13 ENCOUNTER — Ambulatory Visit (INDEPENDENT_AMBULATORY_CARE_PROVIDER_SITE_OTHER): Payer: 59

## 2021-01-13 DIAGNOSIS — M7662 Achilles tendinitis, left leg: Secondary | ICD-10-CM | POA: Diagnosis not present

## 2021-01-13 DIAGNOSIS — M722 Plantar fascial fibromatosis: Secondary | ICD-10-CM

## 2021-01-13 MED ORDER — TRIAMCINOLONE ACETONIDE 10 MG/ML IJ SUSP
10.0000 mg | Freq: Once | INTRAMUSCULAR | Status: AC
  Administered 2021-01-13: 10 mg

## 2021-01-13 MED ORDER — DICLOFENAC SODIUM 75 MG PO TBEC
75.0000 mg | DELAYED_RELEASE_TABLET | Freq: Two times a day (BID) | ORAL | 2 refills | Status: DC
Start: 2021-01-13 — End: 2021-11-17

## 2021-01-13 NOTE — Progress Notes (Signed)
Subjective:   Patient ID: Connie Blanchard, female   DOB: 50 y.o.   MRN: [id number]   HPI Patient presents stating that she has a lot of pain in her left heel it seems to be in the back and the bottom of the back seems worse.  States its been going on for several months she is trying to be more active and it does not allow her and she does not smoke   Review of Systems  All other systems reviewed and are negative.      Objective:  Physical Exam Vitals and nursing note reviewed.  Constitutional:      Appearance: She is well-developed.  Pulmonary:     Effort: Pulmonary effort is normal.  Musculoskeletal:        General: Normal range of motion.  Skin:    General: Skin is warm.  Neurological:     Mental Status: She is alert.    Neurovascular status intact muscle strength was found to be adequate range of motion adequate.  Patient is noted to have exquisite discomfort in the posterior aspect of the right heel medial side Achilles at insertion and discomfort plantar heel not to the same degree but present.  Patient has good digital perfusion well oriented x3 mild equinus noted     Assessment:  Acute Achilles tendinitis right with Planter fasciitis also present     Plan:  H&P reviewed both conditions and I did discuss injection explaining risk.  Patient wants procedure understanding risk and today I did sterile prep of the medial side of the Achilles and injected 3 mg Dexasone Kenalog 5 mg Xylocaine applied air fracture walker to completely immobilize and reappoint for [place] to recheck again in the next 3 weeks and also continue diclofenac 75 mg twice daily  X-rays indicate posterior and plantar spur formation right

## 2021-01-13 NOTE — Patient Instructions (Signed)
Plantar Fasciitis (Heel Spur Syndrome) with Rehab The plantar fascia is a fibrous, ligament-like, soft-tissue structure that spans the bottom of the foot. Plantar fasciitis is a condition that causes pain in the foot due to inflammation of the tissue. SYMPTOMS   Pain and tenderness on the underneath side of the foot.  Pain that worsens with standing or walking. CAUSES  Plantar fasciitis is caused by irritation and injury to the plantar fascia on the underneath side of the foot. Common mechanisms of injury include:  Direct trauma to bottom of the foot.  Damage to a small nerve that runs under the foot where the main fascia attaches to the heel bone.  Stress placed on the plantar fascia due to bone spurs. RISK INCREASES WITH:   Activities that place stress on the plantar fascia (running, jumping, pivoting, or cutting).  Poor strength and flexibility.  Improperly fitted shoes.  Tight calf muscles.  Flat feet.  Failure to warm-up properly before activity.  Obesity. PREVENTION  Warm up and stretch properly before activity.  Allow for adequate recovery between workouts.  Maintain physical fitness:  Strength, flexibility, and endurance.  Cardiovascular fitness.  Maintain a health body weight.  Avoid stress on the plantar fascia.  Wear properly fitted shoes, including arch supports for individuals who have flat feet.  PROGNOSIS  If treated properly, then the symptoms of plantar fasciitis usually resolve without surgery. However, occasionally surgery is necessary.  RELATED COMPLICATIONS   Recurrent symptoms that may result in a chronic condition.  Problems of the lower back that are caused by compensating for the injury, such as limping.  Pain or weakness of the foot during push-off following surgery.  Chronic inflammation, scarring, and partial or complete fascia tear, occurring more often from repeated injections.  TREATMENT  Treatment initially involves the  use of ice and medication to help reduce pain and inflammation. The use of strengthening and stretching exercises may help reduce pain with activity, especially stretches of the Achilles tendon. These exercises may be performed at home or with a therapist. Your caregiver may recommend that you use heel cups of arch supports to help reduce stress on the plantar fascia. Occasionally, corticosteroid injections are given to reduce inflammation. If symptoms persist for greater than 6 months despite non-surgical (conservative), then surgery may be recommended.   MEDICATION   If pain medication is necessary, then nonsteroidal anti-inflammatory medications, such as aspirin and ibuprofen, or other minor pain relievers, such as acetaminophen, are often recommended.  Do not take pain medication within 7 days before surgery.  Prescription pain relievers may be given if deemed necessary by your caregiver. Use only as directed and only as much as you need.  Corticosteroid injections may be given by your caregiver. These injections should be reserved for the most serious cases, because they may only be given a certain number of times.  HEAT AND COLD  Cold treatment (icing) relieves pain and reduces inflammation. Cold treatment should be applied for 10 to 15 minutes every 2 to 3 hours for inflammation and pain and immediately after any activity that aggravates your symptoms. Use ice packs or massage the area with a piece of ice (ice massage).  Heat treatment may be used prior to performing the stretching and strengthening activities prescribed by your caregiver, physical therapist, or athletic trainer. Use a heat pack or soak the injury in warm water.  SEEK IMMEDIATE MEDICAL CARE IF:  Treatment seems to offer no benefit, or the condition worsens.  Any medications   produce adverse side effects.  EXERCISES- RANGE OF MOTION (ROM) AND STRETCHING EXERCISES - Plantar Fasciitis (Heel Spur Syndrome) These exercises  may help you when beginning to rehabilitate your injury. Your symptoms may resolve with or without further involvement from your physician, physical therapist or athletic trainer. While completing these exercises, remember:   Restoring tissue flexibility helps normal motion to return to the joints. This allows healthier, less painful movement and activity.  An effective stretch should be held for at least 30 seconds.  A stretch should never be painful. You should only feel a gentle lengthening or release in the stretched tissue.  RANGE OF MOTION - Toe Extension, Flexion  Sit with your right / left leg crossed over your opposite knee.  Grasp your toes and gently pull them back toward the top of your foot. You should feel a stretch on the bottom of your toes and/or foot.  Hold this stretch for 10 seconds.  Now, gently pull your toes toward the bottom of your foot. You should feel a stretch on the top of your toes and or foot.  Hold this stretch for 10 seconds. Repeat  times. Complete this stretch 3 times per day.   RANGE OF MOTION - Ankle Dorsiflexion, Active Assisted  Remove shoes and sit on a chair that is preferably not on a carpeted surface.  Place right / left foot under knee. Extend your opposite leg for support.  Keeping your heel down, slide your right / left foot back toward the chair until you feel a stretch at your ankle or calf. If you do not feel a stretch, slide your bottom forward to the edge of the chair, while still keeping your heel down.  Hold this stretch for 10 seconds. Repeat 3 times. Complete this stretch 2 times per day.   STRETCH  Gastroc, Standing  Place hands on wall.  Extend right / left leg, keeping the front knee somewhat bent.  Slightly point your toes inward on your back foot.  Keeping your right / left heel on the floor and your knee straight, shift your weight toward the wall, not allowing your back to arch.  You should feel a gentle stretch  in the right / left calf. Hold this position for 10 seconds. Repeat 3 times. Complete this stretch 2 times per day.  STRETCH  Soleus, Standing  Place hands on wall.  Extend right / left leg, keeping the other knee somewhat bent.  Slightly point your toes inward on your back foot.  Keep your right / left heel on the floor, bend your back knee, and slightly shift your weight over the back leg so that you feel a gentle stretch deep in your back calf.  Hold this position for 10 seconds. Repeat 3 times. Complete this stretch 2 times per day.  STRETCH  Gastrocsoleus, Standing  Note: This exercise can place a lot of stress on your foot and ankle. Please complete this exercise only if specifically instructed by your caregiver.   Place the ball of your right / left foot on a step, keeping your other foot firmly on the same step.  Hold on to the wall or a rail for balance.  Slowly lift your other foot, allowing your body weight to press your heel down over the edge of the step.  You should feel a stretch in your right / left calf.  Hold this position for 10 seconds.  Repeat this exercise with a slight bend in your right /   left knee. Repeat 3 times. Complete this stretch 2 times per day.   STRENGTHENING EXERCISES - Plantar Fasciitis (Heel Spur Syndrome)  These exercises may help you when beginning to rehabilitate your injury. They may resolve your symptoms with or without further involvement from your physician, physical therapist or athletic trainer. While completing these exercises, remember:   Muscles can gain both the endurance and the strength needed for everyday activities through controlled exercises.  Complete these exercises as instructed by your physician, physical therapist or athletic trainer. Progress the resistance and repetitions only as guided.  STRENGTH - Towel Curls  Sit in a chair positioned on a non-carpeted surface.  Place your foot on a towel, keeping your heel  on the floor.  Pull the towel toward your heel by only curling your toes. Keep your heel on the floor. Repeat 3 times. Complete this exercise 2 times per day.  STRENGTH - Ankle Inversion  Secure one end of a rubber exercise band/tubing to a fixed object (table, pole). Loop the other end around your foot just before your toes.  Place your fists between your knees. This will focus your strengthening at your ankle.  Slowly, pull your big toe up and in, making sure the band/tubing is positioned to resist the entire motion.  Hold this position for 10 seconds.  Have your muscles resist the band/tubing as it slowly pulls your foot back to the starting position. Repeat 3 times. Complete this exercises 2 times per day.  Document Released: 07/13/2005 Document Revised: 10/05/2011 Document Reviewed: 10/25/2008 [employer] Patient Information 2014 [employer]. Achilles Tendinitis  with Rehab Achilles tendinitis is a disorder of the Achilles tendon. The Achilles tendon connects the large calf muscles (Gastrocnemius and Soleus) to the heel bone (calcaneus). This tendon is sometimes called the heel cord. It is important for pushing-off and standing on your toes and is important for walking, running, or jumping. Tendinitis is often caused by overuse and repetitive microtrauma. SYMPTOMS  Pain, tenderness, swelling, warmth, and redness may occur over the Achilles tendon even at rest.  Pain with pushing off, or flexing or extending the ankle.  Pain that is worsened after or during activity. CAUSES   Overuse sometimes seen with rapid increase in exercise programs or in sports requiring running and jumping.  Poor physical conditioning (strength and flexibility or endurance).  Running sports, especially training running down hills.  Inadequate warm-up before practice or play or failure to stretch before participation.  Injury to the tendon. PREVENTION   Warm up and stretch before practice or  competition.  Allow time for adequate rest and recovery between practices and competition.  Keep up conditioning.  Keep up ankle and leg flexibility.  Improve or keep muscle strength and endurance.  Improve cardiovascular fitness.  Use proper technique.  Use proper equipment (shoes, skates).  To help prevent recurrence, taping, protective strapping, or an adhesive bandage may be recommended for several weeks after healing is complete. PROGNOSIS   Recovery may take weeks to several months to heal.  Longer recovery is expected if symptoms have been prolonged.  Recovery is usually quicker if the inflammation is due to a direct blow as compared with overuse or sudden strain. RELATED COMPLICATIONS   Healing time will be prolonged if the condition is not correctly treated. The injury must be given plenty of time to heal.  Symptoms can reoccur if activity is resumed too soon.  Untreated, tendinitis may increase the risk of tendon rupture requiring additional time for recovery   and possibly surgery. TREATMENT   The first treatment consists of rest anti-inflammatory medication, and ice to relieve the pain.  Stretching and strengthening exercises after resolution of pain will likely help reduce the risk of recurrence. Referral to a physical therapist or athletic trainer for further evaluation and treatment may be helpful.  A walking boot or cast may be recommended to rest the Achilles tendon. This can help break the cycle of inflammation and microtrauma.  Arch supports (orthotics) may be prescribed or recommended by your caregiver as an adjunct to therapy and rest.  Surgery to remove the inflamed tendon lining or degenerated tendon tissue is rarely necessary and has shown less than predictable results. MEDICATION   Nonsteroidal anti-inflammatory medications, such as aspirin and ibuprofen, may be used for pain and inflammation relief. Do not take within 7 days before surgery. Take  these as directed by your caregiver. Contact your caregiver immediately if any bleeding, stomach upset, or signs of allergic reaction occur. Other minor pain relievers, such as acetaminophen, may also be used.  Pain relievers may be prescribed as necessary by your caregiver. Do not take prescription pain medication for longer than 4 to 7 days. Use only as directed and only as much as you need.  Cortisone injections are rarely indicated. Cortisone injections may weaken tendons and predispose to rupture. It is better to give the condition more time to heal than to use them. HEAT AND COLD  Cold is used to relieve pain and reduce inflammation for acute and chronic Achilles tendinitis. Cold should be applied for 10 to 15 minutes every 2 to 3 hours for inflammation and pain and immediately after any activity that aggravates your symptoms. Use ice packs or an ice massage.  Heat may be used before performing stretching and strengthening activities prescribed by your caregiver. Use a heat pack or a warm soak. SEEK MEDICAL CARE IF:  Symptoms get worse or do not improve in 2 weeks despite treatment.  New, unexplained symptoms develop. Drugs used in treatment may produce side effects.  EXERCISES:  RANGE OF MOTION (ROM) AND STRETCHING EXERCISES - Achilles Tendinitis  These exercises may help you when beginning to rehabilitate your injury. Your symptoms may resolve with or without further involvement from your physician, physical therapist or athletic trainer. While completing these exercises, remember:   Restoring tissue flexibility helps normal motion to return to the joints. This allows healthier, less painful movement and activity.  An effective stretch should be held for at least 30 seconds.  A stretch should never be painful. You should only feel a gentle lengthening or release in the stretched tissue.  STRETCH  Gastroc, Standing   Place hands on wall.  Extend right / left leg, keeping the  front knee somewhat bent.  Slightly point your toes inward on your back foot.  Keeping your right / left heel on the floor and your knee straight, shift your weight toward the wall, not allowing your back to arch.  You should feel a gentle stretch in the right / left calf. Hold this position for 10 seconds. Repeat 3 times. Complete this stretch 2 times per day.  STRETCH  Soleus, Standing   Place hands on wall.  Extend right / left leg, keeping the other knee somewhat bent.  Slightly point your toes inward on your back foot.  Keep your right / left heel on the floor, bend your back knee, and slightly shift your weight over the back leg so that you feel a   gentle stretch deep in your back calf.  Hold this position for 10 seconds. Repeat 3 times. Complete this stretch 2 times per day.  STRETCH  Gastrocsoleus, Standing  Note: This exercise can place a lot of stress on your foot and ankle. Please complete this exercise only if specifically instructed by your caregiver.   Place the ball of your right / left foot on a step, keeping your other foot firmly on the same step.  Hold on to the wall or a rail for balance.  Slowly lift your other foot, allowing your body weight to press your heel down over the edge of the step.  You should feel a stretch in your right / left calf.  Hold this position for 10 seconds.  Repeat this exercise with a slight bend in your knee. Repeat 3 times. Complete this stretch 2 times per day.   STRENGTHENING EXERCISES - Achilles Tendinitis These exercises may help you when beginning to rehabilitate your injury. They may resolve your symptoms with or without further involvement from your physician, physical therapist or athletic trainer. While completing these exercises, remember:   Muscles can gain both the endurance and the strength needed for everyday activities through controlled exercises.  Complete these exercises as instructed by your physician,  physical therapist or athletic trainer. Progress the resistance and repetitions only as guided.  You may experience muscle soreness or fatigue, but the pain or discomfort you are trying to eliminate should never worsen during these exercises. If this pain does worsen, stop and make certain you are following the directions exactly. If the pain is still present after adjustments, discontinue the exercise until you can discuss the trouble with your clinician.  STRENGTH - Plantar-flexors   Sit with your right / left leg extended. Holding onto both ends of a rubber exercise band/tubing, loop it around the ball of your foot. Keep a slight tension in the band.  Slowly push your toes away from you, pointing them downward.  Hold this position for 10 seconds. Return slowly, controlling the tension in the band/tubing. Repeat 3 times. Complete this exercise 2 times per day.   STRENGTH - Plantar-flexors   Stand with your feet shoulder width apart. Steady yourself with a wall or table using as little support as needed.  Keeping your weight evenly spread over the width of your feet, rise up on your toes.*  Hold this position for 10 seconds. Repeat 3 times. Complete this exercise 2 times per day.  *If this is too easy, shift your weight toward your right / left leg until you feel challenged. Ultimately, you may be asked to do this exercise with your right / left foot only.  STRENGTH  Plantar-flexors, Eccentric  Note: This exercise can place a lot of stress on your foot and ankle. Please complete this exercise only if specifically instructed by your caregiver.   Place the balls of your feet on a step. With your hands, use only enough support from a wall or rail to keep your balance.  Keep your knees straight and rise up on your toes.  Slowly shift your weight entirely to your right / left toes and pick up your opposite foot. Gently and with controlled movement, lower your weight through your right /  left foot so that your heel drops below the level of the step. You will feel a slight stretch in the back of your calf at the end position.  Use the healthy leg to help rise up onto   the balls of both feet, then lower weight only on the right / left leg again. Build up to 15 repetitions. Then progress to 3 consecutive sets of 15 repetitions.*  After completing the above exercise, complete the same exercise with a slight knee bend (about 30 degrees). Again, build up to 15 repetitions. Then progress to 3 consecutive sets of 15 repetitions.* Perform this exercise 2 times per day.  *When you easily complete 3 sets of 15, your physician, physical therapist or athletic trainer may advise you to add resistance by wearing a backpack filled with additional weight.  STRENGTH - Plantar Flexors, Seated   Sit on a chair that allows your feet to rest flat on the ground. If necessary, sit at the edge of the chair.  Keeping your toes firmly on the ground, lift your right / left heel as far as you can without increasing any discomfort in your ankle. Repeat 3 times. Complete this exercise 2 times a day.  

## 2021-02-10 ENCOUNTER — Ambulatory Visit: Payer: 59 | Admitting: Podiatry

## 2021-02-20 ENCOUNTER — Encounter: Payer: Self-pay | Admitting: Podiatry

## 2021-02-20 ENCOUNTER — Ambulatory Visit: Payer: 59 | Admitting: Podiatry

## 2021-02-20 ENCOUNTER — Other Ambulatory Visit: Payer: Self-pay

## 2021-02-20 DIAGNOSIS — M722 Plantar fascial fibromatosis: Secondary | ICD-10-CM | POA: Diagnosis not present

## 2021-02-20 DIAGNOSIS — M7662 Achilles tendinitis, left leg: Secondary | ICD-10-CM

## 2021-02-21 NOTE — Progress Notes (Signed)
Subjective:   Patient ID: Connie Blanchard, female   DOB: 50 y.o.   MRN: [id number]   HPI Patient states that she is quite improved on her left both back of heel and bottom of heel stating she has mild pain only on the back and has stopped wearing her boot over the last week   ROS      Objective:  Physical Exam  Neurovascular status found to be within normal limits and unchanged with patient found to have continued equinus condition mild discomfort posterior heel but improved minimal discomfort plantarly with no swelling noted currently or increased erythema     Assessment:  Improvement in Achilles tendinitis Planter fasciitis left with conservative treatment     Plan:  H&P reviewed condition recommended finishing oral anti-inflammatory continued ice and stretch exercises shoes with heel lifts and boot as needed.  If symptoms remain under control she does not need to be seen back if they get worse we will need to check and consider other treatments for the future

## 2021-02-23 ENCOUNTER — Other Ambulatory Visit: Payer: Self-pay | Admitting: Family Medicine

## 2021-03-10 ENCOUNTER — Ambulatory Visit: Payer: 59 | Admitting: Cardiology

## 2021-04-01 ENCOUNTER — Encounter: Payer: 59 | Admitting: Family Medicine

## 2021-04-04 ENCOUNTER — Ambulatory Visit: Payer: 59 | Admitting: Cardiology

## 2021-04-29 ENCOUNTER — Other Ambulatory Visit: Payer: Self-pay | Admitting: Surgery

## 2021-04-29 ENCOUNTER — Encounter (HOSPITAL_COMMUNITY): Payer: Self-pay | Admitting: Surgery

## 2021-04-29 ENCOUNTER — Other Ambulatory Visit: Payer: Self-pay

## 2021-04-29 NOTE — Anesthesia Preprocedure Evaluation (Addendum)
Anesthesia Evaluation  Patient identified by MRN, date of birth, ID band Patient awake    Reviewed: Allergy & Precautions, NPO status , Patient's Chart, lab work & pertinent test results, reviewed documented beta blocker date and time   Airway Mallampati: II  TM Distance: >3 FB Neck ROM: Full    Dental no notable dental hx.    Pulmonary former smoker,    Pulmonary exam normal breath sounds clear to auscultation       Cardiovascular hypertension, Pt. on home beta blockers Normal cardiovascular exam Rhythm:Regular Rate:Normal     Neuro/Psych  Headaches, PSYCHIATRIC DISORDERS Anxiety Depression    GI/Hepatic Neg liver ROS, hiatal hernia, GERD  Medicated and Controlled,  Endo/Other  PCOS (polycystic ovarian syndrome)  Renal/GU negative Renal ROS     Musculoskeletal negative musculoskeletal ROS (+)   Abdominal (+) + obese,   Peds  Hematology negative hematology ROS (+)   Anesthesia Other Findings BILIARY DYSKINESIS & GALLBLADDER POLYP  Reproductive/Obstetrics                           Anesthesia Physical Anesthesia Plan  ASA: 2  Anesthesia Plan: General   Post-op Pain Management:    Induction: Intravenous  PONV Risk Score and Plan: 4 or greater and Ondansetron, Dexamethasone, Midazolam, Scopolamine patch - Pre-op and Treatment may vary due to age or medical condition  Airway Management Planned: Oral ETT  Additional Equipment:   Intra-op Plan:   Post-operative Plan: Extubation in OR  Informed Consent: I have reviewed the patients History and Physical, chart, labs and discussed the procedure including the risks, benefits and alternatives for the proposed anesthesia with the patient or authorized representative who has indicated his/her understanding and acceptance.     Dental advisory given  Plan Discussed with: CRNA  Anesthesia Plan Comments: (Reviewed  APP note by Durel Salts, FNP  )      Anesthesia Quick Evaluation

## 2021-04-29 NOTE — Progress Notes (Addendum)
Connie Blanchard denies chest pains, palpations, or shortness of breath. Patient denies having any s/s of Covid in her household.  Patient denies any known exposure to Covid.   PCP is Dr. Penni Homans. Saw Dr. Alphonzo Dublin in February 2022. Connie Blanchard has a history of palpations and shortness of breath. An EKG was done on 09/13/20, and Echo was done on 10/11/20. I saw notes where Connie Blanchard called cardiologist office x 2 to ask if she is cleared for surgery, "since my ECHO was normal."  Connie Blanchard reports that she was told Yes she is cleared.  I do not find notes with that confirmation.  I called Dr. Ninfa Linden office t see if there is a clearance not there, I was told, that none was found.Connie Blanchard denies any recent palpations, and she denies anxiety.  Connie Blanchard takes Ozepmic  1 time a week - on Friday for weight loss.  I instructed patient to shower with antibiotic soap, if it is available.  Dry off with a clean towel. Do not put lotion, powder, cologne or deodorant or makeup.No jewelry or piercings. Men may shave their face and neck. Woman should not shave. No nail polish, artificial or acrylic nails. Wear clean clothes, brush your teeth. Glasses, contact lens,dentures or partials may not be worn in the OR. If you need to wear them, please bring a case for glasses, do not wear contacts or bring a case, the hospital does not have contact cases, dentures or partials will have to be removed , make sure they are clean, we will provide a denture cup to put them in. You will need some one to drive you home and a responsible person over the age of 12 to stay with you for the first 24 hours after surgery.  I will ask PA-C for anesthesia to review.

## 2021-04-29 NOTE — Progress Notes (Signed)
Anesthesia Chart Review:  Pt is a same day work up   Case: [id number] Date/Time: 05/01/21 0830   Procedure: LAPAROSCOPIC CHOLECYSTECTOMY   Anesthesia type: General   Pre-op diagnosis: BILIARY DYSKINESIS & GALLBLADDER POLYP   Location: [facility] / [facility]   Surgeons: Coralie Keens, MD       DISCUSSION: Pt is 50 years old with hx HTN, palpitations, IDA    PROVIDERS: - PCP Is Mosie Lukes, MD - Cardiologist is Berniece Salines, DO who saw pt 09/13/20 for palpitations. Per note, it sounds as if pt having rare episodes of SVT. As events are rare, Dr. Harriet Masson elected not to get Zio patch and instead recommended pt get Kardiamobile and monitor events at home.    LABS: Will be obtained day of surgery    EKG 09/13/20: NSR   CV: Echo 10/11/20:   1. Left ventricular ejection fraction, by estimation, is 60 to 65%. The left ventricle has normal function. The left ventricle has no regional wall motion abnormalities. Left ventricular diastolic parameters are consistent with Grade I diastolic  dysfunction (impaired relaxation).   2. Right ventricular systolic function is normal. The right ventricular size is normal.   3. The mitral valve is normal in structure. No evidence of mitral valve regurgitation. No evidence of mitral stenosis.   4. The aortic valve is normal in structure. Aortic valve regurgitation is not visualized. No aortic stenosis is present.   5. The inferior vena cava is normal in size with greater than 50%  respiratory variability, suggesting right atrial pressure of 3 mmHg.    Past Medical History:  Diagnosis Date   Acute bronchitis 06/24/2012   Acute bronchitis with asthma 06/24/2012   Allergic state 10/15/2013   Anemia 08/25/2011   Anxiety    Anxiety    not current   At risk for heart disease 04/01/2020   Back pain 10/15/2013   Cervical disc disease 04/22/2015   Chronic sinusitis 02/2012   current runny nose of clear drainage   Colon cancer screening 07/29/2011    [facility] Don[facility]el [facility] Philis Pique of GYN for paps and [facility] and denies any concerns   Constipation    Dental crowns present    Depression    Depression with anxiety    in the past    Essential hypertension 10/06/2015   Gallbladder polyp 08/06/2020   GERD (gastroesophageal reflux disease)    H/O gestational diabetes mellitus, not currently pregnant 07/29/2011   h/o    Headache    history of migraines   History of blood transfusion    History of blood transfusion    History of gestational diabetes mellitus (GDM) 03/26/2020   History of hiatal hernia    Hyperglycemia 05/25/2017   Hyperlipidemia, mixed 10/06/2015   Hypertension    Insomnia    Insulin resistance 06/29/2018   Iron deficiency anemia    Laryngopharyngeal reflux (LPR) 02/2012   current runny nose of clear drainage   Left lower quadrant pain 08/06/2020   Left shoulder pain 05/26/2017   Leg edema    Nausea 10/15/2013   Obese    Obesity    Other fatigue 10/12/2017   PCOS (polycystic ovarian syndrome)    Pedal edema 76/22/6333   Periumbilical pain 54/56/2563   Pharyngitis 05/07/2019   Postoperative state 03/17/2017   Right shoulder pain 05/25/2017   Right upper quadrant pain 08/06/2020   Shortness of breath on exertion 10/12/2017   Vitamin D  deficiency 03/26/2020    Past Surgical History:  Procedure Laterality Date   ABDOMINAL HYSTERECTOMY  02/2017   anemia, fibroids   COLONOSCOPY     DILATION AND CURETTAGE OF UTERUS  1987   NASAL SEPTOPLASTY W/ TURBINOPLASTY  03/23/2012   Procedure: NASAL SEPTOPLASTY WITH TURBINATE REDUCTION;  Surgeon: Jerrell Belfast, MD;  Location: [facility];  Service: ENT;  Laterality: Bilateral;   ROBOTIC ASSISTED TOTAL HYSTERECTOMY WITH SALPINGECTOMY N/A 03/17/2017   Procedure: ROBOTIC ASSISTED TOTAL HYSTERECTOMY WITH SALPINGECTOMY;  Surgeon: Bobbye Charleston, MD;  Location: [facility];  Service: Gynecology;  Laterality: N/A;   SINUS ENDO  W/FUSION  03/23/2012   Procedure: ENDOSCOPIC SINUS SURGERY WITH FUSION NAVIGATION;  Surgeon: Jerrell Belfast, MD;  Location: [facility];  Service: ENT;  Laterality: Bilateral;  with fusion scan   UPPER GI ENDOSCOPY     WISDOM TOOTH EXTRACTION      MEDICATIONS: No current facility-administered medications for this encounter.    diclofenac (VOLTAREN) 75 MG EC tablet   esomeprazole (NEXIUM) 40 MG capsule   estradiol (ESTRACE) 0.5 MG tablet   ibuprofen (ADVIL) 600 MG tablet   KLOR-CON M20 20 MEQ tablet   propranolol (INDERAL) 20 MG tablet   Semaglutide,0.25 or 0.5MG /DOS, (OZEMPIC, 0.25 OR 0.5 MG/DOSE,) 2 MG/1.5ML SOPN   SUMAtriptan (IMITREX) 50 MG tablet   triamterene-hydrochlorothiazide (MAXZIDE-25) 37.5-25 MG tablet   If labs acceptable day of surgery, I anticipate pt can proceed with surgery as scheduled.  Willeen Cass, PhD, FNP-BC [facility]/Anesthesiology Phone: [phone] 04/29/2021 2:06 PM

## 2021-04-30 NOTE — H&P (Signed)
Connie Blanchard  Location: [facility] Patient #: [id number] DOB: 02/01/1971 Married / Language: English / Race: White Female   History of Present Illness  The patient is a 50 year old female who presents with abdominal pain.  Chief complaint: Right upper quadrant abdominal pain with bloating  This is a pleasant 50 year old female referred by Dr. Collene Mares for right upper quadrant abdominal pain and bloating.  Her symptoms have been occurring for some time now.  They have been after fatty meals but now have recurred more often.  She will have sharp pain in the epigastric and right upper quadrant as well as bloating and nausea.  She will also have diarrhea.  She has had a gallbladder ultrasound showing a probable polyp measuring about 6 mm.  There were no obvious stones in the bile duct was normal.  She had a HIDA scan showing 40% gallbladder ejection fraction.  She is otherwise healthy without complaints.   Past Surgical History Sabino Gasser, [place];  Hysterectomy (not due to cancer) - Partial    Diagnostic Studies History Sabino Gasser, [place];  Colonoscopy   1-5 years ago Mammogram   1-3 years ago Pap Smear   1-5 years ago  Allergies Sabino Gasser, [place];  Penicillins   Sulfa Drugs    Medication History Sabino Gasser, CMA;  Estradiol  (0.5MG  Tablet, Oral) Active. Medications Reconciled   Pregnancy / Birth History Sabino Gasser, CMA Age at menarche   44 years. Gravida   3 Maternal age   27-20 Para   2  Other Problems Sabino Gasser, [place];  Gastroesophageal Reflux Disease   Hemorrhoids   High blood pressure      Review of Systems Sabino Gasser CMA;  General Not Present- Appetite Loss, Chills, Fatigue, Fever, Night Sweats, Weight Gain and Weight Loss. Skin Not Present- Change in Wart/Mole, Dryness, Hives, Jaundice, New Lesions, Non-Healing Wounds, Rash and Ulcer. HEENT Present- Seasonal Allergies and Sore Throat. Not Present- Earache, Hearing Loss, Hoarseness, Nose  Bleed, Oral Ulcers, Ringing in the Ears, Sinus Pain, Visual Disturbances, Wears glasses/contact lenses and Yellow Eyes. Respiratory Not Present- Bloody sputum, Chronic Cough, Difficulty Breathing, Snoring and Wheezing. Breast Not Present- Breast Mass, Breast Pain, Nipple Discharge and Skin Changes. Cardiovascular Not Present- Chest Pain, Difficulty Breathing Lying Down, Leg Cramps, Palpitations, Rapid Heart Rate, Shortness of Breath and Swelling of Extremities. Gastrointestinal Present- Abdominal Pain, Bloating, Excessive gas, Hemorrhoids, Indigestion and Nausea. Not Present- Bloody Stool, Change in Bowel Habits, Chronic diarrhea, Constipation, Difficulty Swallowing, Gets full quickly at meals, Rectal Pain and Vomiting. Female Genitourinary Not Present- Frequency, Nocturia, Painful Urination, Pelvic Pain and Urgency. Musculoskeletal Not Present- Back Pain, Joint Pain, Joint Stiffness, Muscle Pain, Muscle Weakness and Swelling of Extremities. Neurological Not Present- Decreased Memory, Fainting, Headaches, Numbness, Seizures, Tingling, Tremor, Trouble walking and Weakness. Psychiatric Not Present- Anxiety, Bipolar, Change in Sleep Pattern, Depression, Fearful and Frequent crying. Endocrine Not Present- Cold Intolerance, Excessive Hunger, Hair Changes, Heat Intolerance, Hot flashes and New Diabetes. Hematology Not Present- Blood Thinners, Easy Bruising, Excessive bleeding, Gland problems, HIV and Persistent Infections.  Vitals   Weight: 205.6 lb   Height: 67 in  Body Surface Area: 2.05 m   Body Mass Index: 32.2 kg/m   Temp.: 98 F  (Tympanic)    Pulse: 118 (Regular)    BP: 122/72(Sitting, Left Arm, Standard)       Physical Exam  The physical exam findings are as follows: Note:  On exam, she appears well  Today her abdomen is  soft and nontender. There is no hepatomegaly and no hernias  Lungs clear  CV RRR  Neuro grossly intact  Skin without jaundice    Assessment & Plan    BILIARY DYSKINESIA (K82.8) GALLBLADDER POLYP (K82.4)  Impression: I reviewed her ultrasound and HIDA scan. I suspect she does have biliary dyskinesia and some mild chronic cholecystitis. This small 6 mm polyp in the gallbladder may be a stone. I discussed the diagnosis with her in detail. We discussed continued expectant management first proceeding with a cholecystectomy. I gave her literature regarding gallbladder surgery and we discussed this in detail. I discussed the risks of surgery which includes but is not limited to bleeding, infection, injury to surrounding structures, bile duct injury, bile leak, the need to convert to an open procedure, cardiopulmonary issues, the chance this may not resolve her symptoms, postoperative diarrhea, etc. She understands and is leaning toward proceeding with surgery.

## 2021-05-01 ENCOUNTER — Ambulatory Visit (HOSPITAL_COMMUNITY): Payer: 59 | Admitting: Emergency Medicine

## 2021-05-01 ENCOUNTER — Ambulatory Visit (HOSPITAL_COMMUNITY)
Admission: RE | Admit: 2021-05-01 | Discharge: 2021-05-01 | Disposition: A | Discharge status: Home / Self Care | Payer: 59 | Attending: Surgery | Admitting: Surgery

## 2021-05-01 ENCOUNTER — Encounter (HOSPITAL_COMMUNITY): Payer: Self-pay | Admitting: Surgery

## 2021-05-01 ENCOUNTER — Other Ambulatory Visit: Payer: Self-pay

## 2021-05-01 ENCOUNTER — Encounter (HOSPITAL_COMMUNITY)
Admission: RE | Disposition: A | Discharge status: Home / Self Care | Payer: Self-pay | Source: Home / Self Care | Attending: Surgery

## 2021-05-01 DIAGNOSIS — K811 Chronic cholecystitis: Secondary | ICD-10-CM | POA: Insufficient documentation

## 2021-05-01 DIAGNOSIS — Z880 Allergy status to penicillin: Secondary | ICD-10-CM | POA: Diagnosis not present

## 2021-05-01 DIAGNOSIS — Z882 Allergy status to sulfonamides status: Secondary | ICD-10-CM | POA: Insufficient documentation

## 2021-05-01 DIAGNOSIS — Z791 Long term (current) use of non-steroidal anti-inflammatories (NSAID): Secondary | ICD-10-CM | POA: Diagnosis not present

## 2021-05-01 DIAGNOSIS — Z79899 Other long term (current) drug therapy: Secondary | ICD-10-CM | POA: Diagnosis not present

## 2021-05-01 DIAGNOSIS — K828 Other specified diseases of gallbladder: Secondary | ICD-10-CM | POA: Diagnosis present

## 2021-05-01 DIAGNOSIS — I10 Essential (primary) hypertension: Secondary | ICD-10-CM | POA: Diagnosis not present

## 2021-05-01 DIAGNOSIS — Z87891 Personal history of nicotine dependence: Secondary | ICD-10-CM | POA: Diagnosis not present

## 2021-05-01 HISTORY — DX: Personal history of other diseases of the digestive system: Z87.19

## 2021-05-01 HISTORY — PX: CHOLECYSTECTOMY: SHX55

## 2021-05-01 LAB — BASIC METABOLIC PANEL
Anion gap: 10 (ref 5–15)
BUN: 9 mg/dL (ref 6–20)
CO2: 26 mmol/L (ref 22–32)
Calcium: 9.1 mg/dL (ref 8.9–10.3)
Chloride: 100 mmol/L (ref 98–111)
Creatinine, Ser: 0.98 mg/dL (ref 0.44–1.00)
GFR, Estimated: >60 mL/min (ref >60)
Glucose, Bld: 96 mg/dL (ref 70–99)
Potassium: 3.9 mmol/L (ref 3.5–5.1)
Sodium: 136 mmol/L (ref 135–145)

## 2021-05-01 LAB — CBC
HCT: 40.5 % (ref 36.0–46.0)
Hemoglobin: 14.2 g/dL (ref 12.0–15.0)
MCH: 32.9 pg (ref 26.0–34.0)
MCHC: 35.1 g/dL (ref 30.0–36.0)
MCV: 93.8 fL (ref 80.0–100.0)
Platelets: 259 10*3/uL (ref 150–400)
RBC: 4.32 MIL/uL (ref 3.87–5.11)
RDW: 12.4 % (ref 11.5–15.5)
WBC: 5.6 10*3/uL (ref 4.0–10.5)
nRBC: 0 % (ref 0.0–0.2)

## 2021-05-01 LAB — GLUCOSE, CAPILLARY: Glucose-Capillary: 96 mg/dL (ref 70–99)

## 2021-05-01 SURGERY — LAPAROSCOPIC CHOLECYSTECTOMY
Anesthesia: General

## 2021-05-01 MED ORDER — LIDOCAINE 2% (20 MG/ML) 5 ML SYRINGE
INTRAMUSCULAR | Status: DC | PRN
  Administered 2021-05-01: 60 mg via INTRAVENOUS

## 2021-05-01 MED ORDER — KETOROLAC TROMETHAMINE 30 MG/ML IJ SOLN
INTRAMUSCULAR | Status: AC
  Filled 2021-05-01: qty 1

## 2021-05-01 MED ORDER — ROCURONIUM BROMIDE 10 MG/ML (PF) SYRINGE
PREFILLED_SYRINGE | INTRAVENOUS | Status: AC
  Filled 2021-05-01: qty 10

## 2021-05-01 MED ORDER — ONDANSETRON HCL 4 MG/2ML IJ SOLN
INTRAMUSCULAR | Status: DC | PRN
  Administered 2021-05-01: 4 mg via INTRAVENOUS

## 2021-05-01 MED ORDER — AMISULPRIDE (ANTIEMETIC) 5 MG/2ML IV SOLN: 10.0000 mg | Freq: Once | INTRAVENOUS | Status: DC | PRN

## 2021-05-01 MED ORDER — DEXAMETHASONE SODIUM PHOSPHATE 10 MG/ML IJ SOLN
INTRAMUSCULAR | Status: AC
  Filled 2021-05-01: qty 1

## 2021-05-01 MED ORDER — KETOROLAC TROMETHAMINE 30 MG/ML IJ SOLN
30.0000 mg | Freq: Once | INTRAMUSCULAR | Status: AC | PRN
  Administered 2021-05-01: 30 mg via INTRAVENOUS

## 2021-05-01 MED ORDER — PROPOFOL 10 MG/ML IV BOLUS
INTRAVENOUS | Status: DC | PRN
  Administered 2021-05-01: 150 mg via INTRAVENOUS

## 2021-05-01 MED ORDER — ORAL CARE MOUTH RINSE: 15.0000 mL | Freq: Once | OROMUCOSAL | Status: AC

## 2021-05-01 MED ORDER — LIDOCAINE 2% (20 MG/ML) 5 ML SYRINGE
INTRAMUSCULAR | Status: AC
  Filled 2021-05-01: qty 5

## 2021-05-01 MED ORDER — PHENYLEPHRINE 40 MCG/ML (10ML) SYRINGE FOR IV PUSH (FOR BLOOD PRESSURE SUPPORT)
PREFILLED_SYRINGE | INTRAVENOUS | Status: DC | PRN
  Administered 2021-05-01: 80 ug via INTRAVENOUS

## 2021-05-01 MED ORDER — ROCURONIUM BROMIDE 10 MG/ML (PF) SYRINGE
PREFILLED_SYRINGE | INTRAVENOUS | Status: DC | PRN
  Administered 2021-05-01: 50 mg via INTRAVENOUS

## 2021-05-01 MED ORDER — SCOPOLAMINE 1 MG/3DAYS TD PT72
1.0000 | MEDICATED_PATCH | TRANSDERMAL | Status: DC
  Administered 2021-05-01: 1.5 mg via TRANSDERMAL
  Filled 2021-05-01: qty 1

## 2021-05-01 MED ORDER — CHLORHEXIDINE GLUCONATE CLOTH 2 % EX PADS: 6.0000 | MEDICATED_PAD | Freq: Once | CUTANEOUS | Status: DC

## 2021-05-01 MED ORDER — CEFAZOLIN SODIUM-DEXTROSE 2-4 GM/100ML-% IV SOLN
2.0000 g | INTRAVENOUS | Status: AC
  Administered 2021-05-01: 2 g via INTRAVENOUS
  Filled 2021-05-01: qty 100

## 2021-05-01 MED ORDER — BUPIVACAINE-EPINEPHRINE (PF) 0.25% -1:200000 IJ SOLN
INTRAMUSCULAR | Status: AC
  Filled 2021-05-01: qty 30

## 2021-05-01 MED ORDER — CHLORHEXIDINE GLUCONATE 0.12 % MT SOLN
15.0000 mL | Freq: Once | OROMUCOSAL | Status: AC
  Administered 2021-05-01: 15 mL via OROMUCOSAL
  Filled 2021-05-01: qty 15

## 2021-05-01 MED ORDER — BUPIVACAINE-EPINEPHRINE 0.25% -1:200000 IJ SOLN
INTRAMUSCULAR | Status: DC | PRN
  Administered 2021-05-01: 30 mL

## 2021-05-01 MED ORDER — OXYCODONE HCL 5 MG PO TABS
5.0000 mg | ORAL_TABLET | Freq: Once | ORAL | Status: DC | PRN
Start: 2021-05-01 — End: 2021-05-01

## 2021-05-01 MED ORDER — DEXAMETHASONE SODIUM PHOSPHATE 10 MG/ML IJ SOLN
INTRAMUSCULAR | Status: DC | PRN
  Administered 2021-05-01: 8 mg via INTRAVENOUS

## 2021-05-01 MED ORDER — ACETAMINOPHEN 500 MG PO TABS
1000.0000 mg | ORAL_TABLET | ORAL | Status: AC
  Administered 2021-05-01: 1000 mg via ORAL
  Filled 2021-05-01: qty 2

## 2021-05-01 MED ORDER — MIDAZOLAM HCL 2 MG/2ML IJ SOLN
INTRAMUSCULAR | Status: AC
  Filled 2021-05-01: qty 2

## 2021-05-01 MED ORDER — LACTATED RINGERS IV SOLN: INTRAVENOUS | Status: DC

## 2021-05-01 MED ORDER — OXYCODONE HCL 5 MG PO TABS: 5.0000 mg | ORAL_TABLET | Freq: Four times a day (QID) | ORAL | 0 refills | Status: DC | PRN

## 2021-05-01 MED ORDER — OXYCODONE HCL 5 MG/5ML PO SOLN: 5.0000 mg | Freq: Once | ORAL | Status: DC | PRN

## 2021-05-01 MED ORDER — 0.9 % SODIUM CHLORIDE (POUR BTL) OPTIME
TOPICAL | Status: DC | PRN
  Administered 2021-05-01: 1000 mL

## 2021-05-01 MED ORDER — PROMETHAZINE HCL 25 MG/ML IJ SOLN: 6.2500 mg | INTRAMUSCULAR | Status: DC | PRN

## 2021-05-01 MED ORDER — SUGAMMADEX SODIUM 200 MG/2ML IV SOLN
INTRAVENOUS | Status: DC | PRN
  Administered 2021-05-01: 417.2 mg via INTRAVENOUS

## 2021-05-01 MED ORDER — FENTANYL CITRATE (PF) 100 MCG/2ML IJ SOLN: 25.0000 ug | INTRAMUSCULAR | Status: DC | PRN

## 2021-05-01 MED ORDER — ONDANSETRON HCL 4 MG/2ML IJ SOLN
INTRAMUSCULAR | Status: AC
  Filled 2021-05-01: qty 2

## 2021-05-01 MED ORDER — MIDAZOLAM HCL 2 MG/2ML IJ SOLN
INTRAMUSCULAR | Status: DC | PRN
Start: 2021-05-01 — End: 2021-05-01
  Administered 2021-05-01: 2 mg via INTRAVENOUS

## 2021-05-01 MED ORDER — FENTANYL CITRATE (PF) 100 MCG/2ML IJ SOLN
INTRAMUSCULAR | Status: DC | PRN
  Administered 2021-05-01: 50 ug via INTRAVENOUS
  Administered 2021-05-01: 200 ug via INTRAVENOUS

## 2021-05-01 MED ORDER — SODIUM CHLORIDE 0.9 % IR SOLN
Status: DC | PRN
  Administered 2021-05-01: 1000 mL

## 2021-05-01 MED ORDER — FENTANYL CITRATE (PF) 250 MCG/5ML IJ SOLN
INTRAMUSCULAR | Status: AC
  Filled 2021-05-01: qty 5

## 2021-05-01 SURGICAL SUPPLY — 33 items
APPLIER CLIP 5 13 M/L LIGAMAX5 (MISCELLANEOUS) ×2
BAG COUNTER SPONGE SURGICOUNT (BAG) ×2 IMPLANT
CANISTER SUCT 3000ML PPV (MISCELLANEOUS) ×2 IMPLANT
CHLORAPREP W/TINT 26 (MISCELLANEOUS) ×2 IMPLANT
CLIP APPLIE 5 13 M/L LIGAMAX5 (MISCELLANEOUS) ×1 IMPLANT
COVER SURGICAL LIGHT HANDLE (MISCELLANEOUS) ×2 IMPLANT
DERMABOND ADVANCED (GAUZE/BANDAGES/DRESSINGS) ×1
DERMABOND ADVANCED .7 DNX12 (GAUZE/BANDAGES/DRESSINGS) ×1 IMPLANT
ELECT REM PT RETURN 9FT ADLT (ELECTROSURGICAL) ×2
ELECTRODE REM PT RTRN 9FT ADLT (ELECTROSURGICAL) ×1 IMPLANT
GLOVE SURG SIGNA 7.5 PF LTX (GLOVE) ×2 IMPLANT
GOWN STRL REUS W/ TWL LRG LVL3 (GOWN DISPOSABLE) ×2 IMPLANT
GOWN STRL REUS W/ TWL XL LVL3 (GOWN DISPOSABLE) ×1 IMPLANT
GOWN STRL REUS W/TWL LRG LVL3 (GOWN DISPOSABLE) ×4
GOWN STRL REUS W/TWL XL LVL3 (GOWN DISPOSABLE) ×2
KIT BASIN OR (CUSTOM PROCEDURE TRAY) ×2 IMPLANT
KIT TURNOVER KIT B (KITS) ×2 IMPLANT
NS IRRIG 1000ML POUR BTL (IV SOLUTION) ×2 IMPLANT
PAD ARMBOARD 7.5X6 YLW CONV (MISCELLANEOUS) ×2 IMPLANT
POUCH SPECIMEN RETRIEVAL 10MM (ENDOMECHANICALS) ×2 IMPLANT
SCISSORS LAP 5X35 DISP (ENDOMECHANICALS) ×2 IMPLANT
SET IRRIG TUBING LAPAROSCOPIC (IRRIGATION / IRRIGATOR) ×2 IMPLANT
SET TUBE SMOKE EVAC HIGH FLOW (TUBING) ×2 IMPLANT
SLEEVE ENDOPATH XCEL 5M (ENDOMECHANICALS) ×4 IMPLANT
SPECIMEN JAR SMALL (MISCELLANEOUS) ×2 IMPLANT
SUT MNCRL AB 4-0 PS2 18 (SUTURE) ×2 IMPLANT
TOWEL GREEN STERILE (TOWEL DISPOSABLE) ×2 IMPLANT
TOWEL GREEN STERILE FF (TOWEL DISPOSABLE) ×2 IMPLANT
TRAY LAPAROSCOPIC MC (CUSTOM PROCEDURE TRAY) ×2 IMPLANT
TROCAR XCEL BLUNT TIP 100MML (ENDOMECHANICALS) ×2 IMPLANT
TROCAR XCEL NON-BLD 5MMX100MML (ENDOMECHANICALS) ×2 IMPLANT
WARMER LAPAROSCOPE (MISCELLANEOUS) ×2 IMPLANT
WATER STERILE IRR 1000ML POUR (IV SOLUTION) ×2 IMPLANT

## 2021-05-01 NOTE — Discharge Instructions (Signed)
[facility] ______[facility] °LAPAROSCOPIC SURGERY: POST OP INSTRUCTIONS °Always review your discharge instruction sheet given to you by the facility where your surgery was performed. °IF YOU HAVE DISABILITY OR FAMILY LEAVE FORMS, YOU MUST BRING THEM TO THE OFFICE FOR PROCESSING.   °DO NOT GIVE THEM TO YOUR DOCTOR. ° °A prescription for pain medication may be given to you upon discharge.  Take your pain medication as prescribed, if needed.  If narcotic pain medicine is not needed, then you may take acetaminophen (Tylenol) or ibuprofen (Advil) as needed. °Take your usually prescribed medications unless otherwise directed. °If you need a refill on your pain medication, please contact your pharmacy.  They will contact our office to request authorization. Prescriptions will not be filled after 5pm or on week-ends. °You should follow a light diet the first few days after arrival home, such as soup and crackers, etc.  Be sure to include lots of fluids daily. °Most patients will experience some swelling and bruising in the area of the incisions.  Ice packs will help.  Swelling and bruising can take several days to resolve.  °It is common to experience some constipation if taking pain medication after surgery.  Increasing fluid intake and taking a stool softener (such as Colace) will usually help or prevent this problem from occurring.  A mild laxative (Milk of Magnesia or Miralax) should be taken according to package instructions if there are no bowel movements after 48 hours. °Unless discharge instructions indicate otherwise, you may remove your bandages 24-48 hours after surgery, and you may shower at that time.  You may have steri-strips (small skin tapes) in place directly over the incision.  These strips should be left on the skin for 7-10 days.  If your surgeon used skin glue on the incision, you may shower in 24 hours.  The glue will flake off over the next 2-3 weeks.  Any sutures or staples will be  removed at the office during your follow-up visit. °ACTIVITIES:  You may resume regular (light) daily activities beginning the next day--such as daily self-care, walking, climbing stairs--gradually increasing activities as tolerated.  You may have sexual intercourse when it is comfortable.  Refrain from any heavy lifting or straining until approved by your doctor. °You may drive when you are no longer taking prescription pain medication, you can comfortably wear a seatbelt, and you can safely maneuver your car and apply brakes. °RETURN TO WORK:  __________________________________________________________ °You should see your doctor in the office for a follow-up appointment approximately 2-3 weeks after your surgery.  Make sure that you call for this appointment within a day or two after you arrive home to insure a convenient appointment time. °OTHER INSTRUCTIONS: OK TO SHOWER STARTING TOMORROW °ICE PACK, TYLENOL, AND IBUPROFEN ALSO FOR PAIN °NO LIFTING MORE THAN 15 POUNDS FOR 2 WEEKS__________________________________________________________________________________________________________________________ __________________________________________________________________________________________________________________________ °WHEN TO CALL YOUR DOCTOR: °Fever over 101.0 °Inability to urinate °Continued bleeding from incision. °Increased pain, redness, or drainage from the incision. °Increasing abdominal pain ° °The clinic staff is available to answer your questions during regular business hours.  Please don’t hesitate to call and ask to speak to one of the nurses for clinical concerns.  If you have a medical emergency, go to the nearest emergency room or call 911.  A surgeon from [facility] is always on call at the hospital. °[address], [address], [place], [place]  [address] ? [address] [address], [place], [place]   [address] °[phone] ? [phone] ? FAX (  [phone] °Web site:  [email or web address]  ° ° °

## 2021-05-01 NOTE — Op Note (Signed)
Laparoscopic Cholecystectomy Procedure Note  Indications: This patient presents with symptomatic gallbladder disease and will undergo laparoscopic cholecystectomy.  Pre-operative Diagnosis: Biliary Dyskinesia  Post-operative Diagnosis: Biliary Dyskinesia  Surgeon: Coralie Keens MD  Assistants: Vernell Leep MD  Anesthesia: General endotracheal anesthesia  ASA Class: 2  Procedure Details  The patient was seen again in the Holding Room. The risks, benefits, complications, treatment options, and expected outcomes were discussed with the patient. The possibilities of reaction to medication, pulmonary aspiration, perforation of viscus, bleeding, recurrent infection, finding a normal gallbladder, the need for additional procedures, failure to diagnose a condition, the possible need to convert to an open procedure, and creating a complication requiring transfusion or operation were discussed with the patient. The likelihood of improving the patient's symptoms with return to their baseline status is good.  The patient and/or family concurred with the proposed plan, giving informed consent. The site of surgery properly noted. The patient was taken to Operating Room, identified as [name] and the procedure verified as Laparoscopic Cholecystectomy. A Time Out was held and the above information confirmed.  Prior to the induction of general anesthesia, antibiotic prophylaxis was administered. General endotracheal anesthesia was then administered and tolerated well. After the induction, the abdomen was prepped with Chloraprep and draped in sterile fashion. The patient was positioned in the supine position.  A 3 cm vertical incision was made on midline below the umbilicus. We dissected down to the abdominal fascia with blunt dissection.  The fascia was incised vertically and we entered the peritoneal cavity bluntly.  A pursestring suture of 0-Vicryl was placed around the fascial opening.  The  Hasson cannula was inserted and secured with the stay suture.  Pneumoperitoneum was then created with CO2 and tolerated well without any adverse changes in the patient's vital signs. An 5 mm port was placed in the subxiphoid position.  Two 5 mm ports were placed in the right upper quadrant.  We positioned the patient in reverse Trendelenburg, tilted slightly to the patient's left.  The gallbladder was identified, the fundus grasped and retracted cephalad. Adhesions were lysed bluntly and with the electrocautery where indicated, taking care not to injure any adjacent organs or viscus. The infundibulum was grasped and retracted laterally, exposing the peritoneum overlying the triangle of Calot. This was then divided and exposed in a blunt fashion. The cystic duct was clearly identified and bluntly dissected circumferentially. A critical view of the cystic duct and cystic artery was obtained.  The cystic duct was then ligated with clips and divided. The cystic artery was, dissected free, ligated with clips and divided as well.   The gallbladder was dissected from the liver bed in retrograde fashion with the electrocautery. The gallbladder was removed and placed in an Endocatch sac. The liver bed was irrigated and inspected. Hemostasis was achieved with the electrocautery. Copious irrigation was utilized and was repeatedly aspirated until clear.  The gallbladder and Endocatch sac were then removed through the umbilical port site.  The pursestring suture was used to close the umbilical fascia.    We again inspected the right upper quadrant for hemostasis.  Pneumoperitoneum was released as we removed the trocars.  30 cc of 0.1% marcaine was then equally divided across all of the skin incisions. 4-0 Monocryl was used to close the skin. Dermabond was then used to finish all closures. The patient was then extubated and brought to the recovery room in stable condition. Instrument, sponge, and needle counts were correct  at closure and  at the conclusion of the case.   Findings: Chronic cholecystitis   Estimated Blood Loss: Minimal         Drains: N/a         Specimens: Gallbladder           Complications: None; patient tolerated the procedure well.         Disposition: PACU - hemodynamically stable.         Condition: stable

## 2021-05-01 NOTE — Anesthesia Postprocedure Evaluation (Signed)
Anesthesia Post Note  Patient: Connie Blanchard  Procedure(s) Performed: LAPAROSCOPIC CHOLECYSTECTOMY     Patient location during evaluation: PACU Anesthesia Type: General Level of consciousness: awake Pain management: pain level controlled Vital Signs Assessment: post-procedure vital signs reviewed and stable Respiratory status: spontaneous breathing, nonlabored ventilation, respiratory function stable and patient connected to nasal cannula oxygen Cardiovascular status: blood pressure returned to baseline and stable Postop Assessment: no apparent nausea or vomiting Anesthetic complications: no   No notable events documented.  Last Vitals:  Vitals:   05/01/21 1015 05/01/21 1027  BP: 133/79 132/69  Pulse: 76 72  Resp: 13 12  Temp:  36.6 C  SpO2: 98% 100%    Last Pain:  Vitals:   05/01/21 1027  TempSrc:   PainSc: 2                  Makhya Arave P Lysandra Loughmiller

## 2021-05-01 NOTE — Transfer of Care (Signed)
Immediate Anesthesia Transfer of Care Note  Patient: Connie Blanchard  Procedure(s) Performed: LAPAROSCOPIC CHOLECYSTECTOMY  Patient Location: PACU  Anesthesia Type:General  Level of Consciousness: awake, alert , oriented and patient cooperative  Airway & Oxygen Therapy: Patient Spontanous Breathing and Patient connected to face mask oxygen  Post-op Assessment: Report given to RN and Post -op Vital signs reviewed and stable  Post vital signs: Reviewed and stable  Last Vitals:  Vitals Value Taken Time  BP 130/83 05/01/21 0959  Temp    Pulse 75 05/01/21 1001  Resp 9 05/01/21 1001  SpO2 98 % 05/01/21 1001  Vitals shown include unvalidated device data.  Last Pain:  Vitals:   05/01/21 0657  TempSrc:   PainSc: 3       Patients Stated Pain Goal: 2 (92/33/00 7622)  Complications: No notable events documented.

## 2021-05-01 NOTE — Interval H&P Note (Signed)
History and Physical Interval Note:no change in H and P  05/01/2021 8:16 AM  Connie Blanchard  has presented today for surgery, with the diagnosis of BILIARY DYSKINESIS & GALLBLADDER POLYP.  The various methods of treatment have been discussed with the patient and family. After consideration of risks, benefits and other options for treatment, the patient has consented to  Procedure(s): LAPAROSCOPIC CHOLECYSTECTOMY (N/A) as a surgical intervention.  The patient's history has been reviewed, patient examined, no change in status, stable for surgery.  I have reviewed the patient's chart and labs.  Questions were answered to the patient's satisfaction.     Coralie Keens

## 2021-05-01 NOTE — Anesthesia Procedure Notes (Signed)
Procedure Name: Intubation Date/Time: 05/01/2021 9:00 AM Performed by: Minerva Ends, CRNA Pre-anesthesia Checklist: Patient identified, Emergency Drugs available, Suction available and Patient being monitored Patient Re-evaluated:Patient Re-evaluated prior to induction Oxygen Delivery Method: Circle system utilized Preoxygenation: Pre-oxygenation with 100% oxygen Induction Type: IV induction Ventilation: Mask ventilation without difficulty Laryngoscope Size: Mac and 3 Grade View: Grade I Tube type: Oral Tube size: 7.0 mm Number of attempts: 1 Airway Equipment and Method: Stylet and Oral airway Placement Confirmation: ETT inserted through vocal cords under direct vision, positive ETCO2 and breath sounds checked- equal and bilateral Secured at: 22 cm Tube secured with: Tape Dental Injury: Teeth and Oropharynx as per pre-operative assessment

## 2021-05-02 ENCOUNTER — Encounter (HOSPITAL_COMMUNITY): Payer: Self-pay | Admitting: Surgery

## 2021-05-05 LAB — SURGICAL PATHOLOGY

## 2021-05-16 ENCOUNTER — Ambulatory Visit: Payer: 59 | Admitting: Cardiology

## 2021-06-01 ENCOUNTER — Other Ambulatory Visit: Payer: Self-pay | Admitting: Family Medicine

## 2021-06-30 ENCOUNTER — Ambulatory Visit: Payer: 59 | Admitting: Podiatry

## 2021-07-08 ENCOUNTER — Other Ambulatory Visit: Payer: Self-pay | Admitting: Obstetrics and Gynecology

## 2021-07-08 DIAGNOSIS — Z1231 Encounter for screening mammogram for malignant neoplasm of breast: Secondary | ICD-10-CM

## 2021-08-08 ENCOUNTER — Ambulatory Visit
Admission: RE | Admit: 2021-08-08 | Discharge: 2021-08-08 | Disposition: A | Discharge status: Home / Self Care | Payer: 59 | Source: Ambulatory Visit | Attending: Obstetrics and Gynecology | Admitting: Obstetrics and Gynecology

## 2021-08-08 DIAGNOSIS — Z1231 Encounter for screening mammogram for malignant neoplasm of breast: Secondary | ICD-10-CM

## 2021-08-11 ENCOUNTER — Ambulatory Visit: Payer: 59 | Admitting: Family Medicine

## 2021-08-12 ENCOUNTER — Other Ambulatory Visit: Payer: Self-pay | Admitting: Obstetrics and Gynecology

## 2021-08-12 DIAGNOSIS — R928 Other abnormal and inconclusive findings on diagnostic imaging of breast: Secondary | ICD-10-CM

## 2021-08-25 ENCOUNTER — Inpatient Hospital Stay: Admission: RE | Admit: 2021-08-25 | Payer: 59 | Source: Ambulatory Visit

## 2021-08-28 ENCOUNTER — Ambulatory Visit
Admission: RE | Admit: 2021-08-28 | Discharge: 2021-08-28 | Disposition: A | Discharge status: Home / Self Care | Payer: 59 | Source: Ambulatory Visit | Attending: Obstetrics and Gynecology | Admitting: Obstetrics and Gynecology

## 2021-08-28 DIAGNOSIS — R928 Other abnormal and inconclusive findings on diagnostic imaging of breast: Secondary | ICD-10-CM

## 2021-09-02 ENCOUNTER — Other Ambulatory Visit: Payer: 59

## 2021-09-02 ENCOUNTER — Other Ambulatory Visit: Payer: Self-pay | Admitting: Family Medicine

## 2021-10-11 ENCOUNTER — Other Ambulatory Visit: Payer: Self-pay | Admitting: Cardiology

## 2021-10-11 ENCOUNTER — Encounter: Payer: Self-pay | Admitting: Cardiology

## 2021-10-11 DIAGNOSIS — R002 Palpitations: Secondary | ICD-10-CM

## 2021-10-13 ENCOUNTER — Ambulatory Visit (INDEPENDENT_AMBULATORY_CARE_PROVIDER_SITE_OTHER): Payer: 59

## 2021-10-13 DIAGNOSIS — R002 Palpitations: Secondary | ICD-10-CM

## 2021-10-13 NOTE — Progress Notes (Unsigned)
Enrolled for [employer] to mail a ZIO XT long term holter monitor to the patients address on file.  

## 2021-10-18 DIAGNOSIS — R002 Palpitations: Secondary | ICD-10-CM

## 2021-11-17 ENCOUNTER — Encounter: Payer: Self-pay | Admitting: Cardiology

## 2021-11-17 ENCOUNTER — Ambulatory Visit: Payer: 59 | Admitting: Cardiology

## 2021-11-17 ENCOUNTER — Telehealth: Payer: Self-pay | Admitting: Cardiology

## 2021-11-17 VITALS — BP 128/86 | HR 84 | Ht 67.0 in | Wt 231.0 lb

## 2021-11-17 DIAGNOSIS — E669 Obesity, unspecified: Secondary | ICD-10-CM

## 2021-11-17 DIAGNOSIS — I471 Supraventricular tachycardia: Secondary | ICD-10-CM

## 2021-11-17 DIAGNOSIS — Z79899 Other long term (current) drug therapy: Secondary | ICD-10-CM

## 2021-11-17 DIAGNOSIS — I10 Essential (primary) hypertension: Secondary | ICD-10-CM

## 2021-11-17 DIAGNOSIS — I4729 Other ventricular tachycardia: Secondary | ICD-10-CM | POA: Diagnosis not present

## 2021-11-17 DIAGNOSIS — R079 Chest pain, unspecified: Secondary | ICD-10-CM | POA: Diagnosis not present

## 2021-11-17 MED ORDER — METOPROLOL TARTRATE 100 MG PO TABS: ORAL_TABLET | ORAL | 0 refills | Status: DC

## 2021-11-17 MED ORDER — PROPRANOLOL HCL 20 MG PO TABS: 20.0000 mg | ORAL_TABLET | Freq: Three times a day (TID) | ORAL | 3 refills | Status: DC

## 2021-11-17 NOTE — Patient Instructions (Addendum)
Medication Instructions:  ?Your physician has recommended you make the following change in your medication:  ?START: Propanolol 20 mg every 8 hours daily.  ?*If you need a refill on your cardiac medications before your next appointment, please call your pharmacy* ? ? ?Lab Work: ?Your physician recommends that you return for lab work in:  ?TODAY: BMET, Ma g ?If you have labs (blood work) drawn today and your tests are completely normal, you will receive your results only by: ?MyChart Message (if you have MyChart) OR ?A paper copy in the mail ?If you have any lab test that is abnormal or we need to change your treatment, we will call you to review the results. ? ? ?Testing/Procedures: ? ? ?Your cardiac CT will be scheduled at one of the below locations:  ? ?[facility] ?[address] ?[place], [place] [address] ?[phone] ? ? ?If scheduled at [facility], please arrive at the [facility] En[facility] (Entrance C2) of [facility] 30 minutes prior to test start time. ?You can use the FREE valet parking offered at entrance C (encouraged to control the heart rate for the test)  ?Proceed to the [facility] Radiology Department (first floor) to check-in and test prep. ? ?All radiology patients and guests should use entrance C2 at [facility], accessed from [facility], even though the hospital's physical address listed is [address] ? ? ? ? ?Please follow these instructions carefully (unless otherwise directed): ? ? ?On the Night Before the Test: ?Be sure to Drink plenty of water. ?Do not consume any caffeinated/decaffeinated beverages or chocolate 12 hours prior to your test. ?Do not take any antihistamines 12 hours prior to your test. ? ?On the Day of the Test: ?Drink plenty of water until 1 hour prior to the test. ?Do not eat any food 4 hours prior to the test. ?You may take your regular medications prior to the test.  ?Take metoprolol (Lopressor)  two hours prior to test. ?HOLD Hydrochlorothiazide and Propranolol morning of the test. ?FEMALES- please wear underwire-free bra if available, avoid dresses & tight clothing ? ?     ?After the Test: ?Drink plenty of water. ?After receiving IV contrast, you may experience a mild flushed feeling. This is normal. ?On occasion, you may experience a mild rash up to 24 hours after the test. This is not dangerous. If this occurs, you can take Benadryl 25 mg and increase your fluid intake. ?If you experience trouble breathing, this can be serious. If it is severe call 911 IMMEDIATELY. If it is mild, please call our office. ?If you take any of these medications: Glipizide/Metformin, Avandament, Glucavance, please do not take 48 hours after completing test unless otherwise instructed. ? ?We will call to schedule your test 2-4 weeks out understanding that some insurance companies will need an authorization prior to the service being performed.  ? ?For non-scheduling related questions, please contact the cardiac imaging nurse navigator should you have any questions/concerns: ?Marchia Bond, Cardiac Imaging Nurse Navigator ?Gordy Clement, Cardiac Imaging Nurse Navigator ?[facility] ?Direct Office Dial: [phone]  ? ?For scheduling needs, including cancellations and rescheduling, please call [place], [phone]. ? ? ? ?Follow-Up: ?At [facility], you and your health needs are our priority.  As part of our continuing mission to provide you with exceptional heart care, we have created designated Provider Care Teams.  These Care Teams include your primary Cardiologist (physician) and Advanced Practice Providers (  APPs -  Physician Assistants and Nurse Practitioners) who all work together to provide you with the care you need, when you need it. ? ?We recommend signing up for the patient portal called "MyChart".  Sign up information is provided on this After Visit Summary.  MyChart is used to  connect with patients for Virtual Visits (Telemedicine).  Patients are able to view lab/test results, encounter notes, upcoming appointments, etc.  Non-urgent messages can be sent to your provider as well.   ?To learn more about what you can do with MyChart, go to [email or web address].   ? ?Your next appointment:   ?6 month(s) ? ?The format for your next appointment:   ?In Person ? ?Provider:   ?Berniece Salines, DO   ? ? ?Other Instructions ? ? ?Important Information About Sugar ? ? ? ? ?  ?

## 2021-11-17 NOTE — Telephone Encounter (Signed)
Called pt to set up follow up appt per Dr. Terrial Rhodes request.  ?

## 2021-11-17 NOTE — Telephone Encounter (Signed)
Follow Up: ? ? ?Patient said she received her Monitor results. She said she would like for Dr Harriet Masson or the nurse to please go over her results with her please. ? ?

## 2021-11-18 LAB — BASIC METABOLIC PANEL
BUN/Creatinine Ratio: 9 (ref 9–23)
BUN: 9 mg/dL (ref 6–24)
CO2: 26 mmol/L (ref 20–29)
Calcium: 9.6 mg/dL (ref 8.7–10.2)
Chloride: 98 mmol/L (ref 96–106)
Creatinine, Ser: 0.97 mg/dL (ref 0.57–1.00)
Glucose: 95 mg/dL (ref 70–99)
Potassium: 4 mmol/L (ref 3.5–5.2)
Sodium: 142 mmol/L (ref 134–144)
eGFR: 70 mL/min/{1.73_m2} (ref >59)

## 2021-11-18 LAB — MAGNESIUM: Magnesium: 2 mg/dL (ref 1.6–2.3)

## 2021-11-18 NOTE — Progress Notes (Signed)
?Cardiology Office Note:   ? ?Date:  11/18/2021  ? ?ID:  Connie Blanchard, DOB 10-28-1970, MRN [id number] ? ?PCP:  Mosie Lukes, MD  ?Cardiologist:  Berniece Salines, DO  ?Electrophysiologist:  None  ? ?Referring MD: Mosie Lukes, MD  ? ?" I am doing much better" ? ? ?History of Present Illness:   ? ?Connie Blanchard is a 51 y.o. female with a hx of anxiety, gestational diabetes, obesity, migraine headaches, hypertension, paroxysmal supraventricular tachycardia and nonsustained ventricular tachycardia here today for follow-up visit. ? ?I saw the patient in February 2018 at that time she had been experiencing intermittent palpitations as well as shortness of breath.  Given the rarity of her symptoms I asked the patient to get a mobile Kardia for monitoring.  We also had her start propanolol as needed for heart rate greater than 130 bpm. ? ?In the meantime she did tell me that she was experiencing significant palpitations.  We will transition the patient to as needed propanolol to daily propanolol.  At the same time we will place a monitor on the patient.  She tells me that she had been transitioned to the daily propanolol she has had much better symptoms.  But she does report that she had times where she feel intermittent chest tightness.  She is concerned about this. ? ?Past Medical History:  ?Diagnosis Date  ? Acute bronchitis 06/24/2012  ? Acute bronchitis with asthma 06/24/2012  ? Allergic state 10/15/2013  ? Anemia 08/25/2011  ? Anxiety   ? Anxiety   ? not current  ? At risk for heart disease 04/01/2020  ? Back pain 10/15/2013  ? Cervical disc disease 04/22/2015  ? Chronic sinusitis 02/2012  ? current runny nose of clear drainage  ? Colon cancer screening 07/29/2011  ? Sees Dr Donne Hazel of dermatology in [place] of [facility]YN for paps and [facility] and denies any concerns  ? Constipation   ? Dental crowns present   ? Depression   ? Depression with anxiety   ? in the past   ? Essential hypertension  10/06/2015  ? Gallbladder polyp 08/06/2020  ? GERD (gastroesophageal reflux disease)   ? H/O gestational diabetes mellitus, not currently pregnant 07/29/2011  ? h/o   ? Headache   ? history of migraines  ? History of blood transfusion   ? History of blood transfusion   ? History of gestational diabetes mellitus (GDM) 03/26/2020  ? History of hiatal hernia   ? Hyperglycemia 05/25/2017  ? Hyperlipidemia, mixed 10/06/2015  ? Hypertension   ? Insomnia   ? Insulin resistance 06/29/2018  ? Iron deficiency anemia   ? Laryngopharyngeal reflux (LPR) 02/2012  ? current runny nose of clear drainage  ? Left lower quadrant pain 08/06/2020  ? Left shoulder pain 05/26/2017  ? Leg edema   ? Nausea 10/15/2013  ? Obese   ? Obesity   ? Other fatigue 10/12/2017  ? PCOS (polycystic ovarian syndrome)   ? Pedal edema 10/06/2015  ? Periumbilical pain 40/97/3532  ? Pharyngitis 05/07/2019  ? Postoperative state 03/17/2017  ? Right shoulder pain 05/25/2017  ? Right upper quadrant pain 08/06/2020  ? Shortness of breath on exertion 10/12/2017  ? Vitamin D deficiency 03/26/2020  ? ? ?Past Surgical History:  ?Procedure Laterality Date  ? ABDOMINAL HYSTERECTOMY  02/2017  ? anemia, fibroids  ? CHOLECYSTECTOMY N/A 05/01/2021  ? Procedure: LAPAROSCOPIC CHOLECYSTECTOMY;  Surgeon: Coralie Keens, MD;  Location: [place];  Service: General;  Laterality: N/A;  ? COLONOSCOPY    ? [place] OF UTERUS  1987  ? NASAL SEPTOPLASTY W/ TURBINOPLASTY  03/23/2012  ? Procedure: NASAL SEPTOPLASTY WITH TURBINATE REDUCTION;  Surgeon: Jerrell Belfast, MD;  Location: [place];  Service: ENT;  Laterality: Bilateral;  ? ROBOTIC ASSISTED TOTAL HYSTERECTOMY WITH SALPINGECTOMY N/A 03/17/2017  ? Procedure: ROBOTIC ASSISTED TOTAL HYSTERECTOMY WITH SALPINGECTOMY;  Surgeon: Bobbye Charleston, MD;  Location: [facility];  Service: Gynecology;  Laterality: N/A;  ? SINUS ENDO W/FUSION  03/23/2012  ? Procedure: ENDOSCOPIC SINUS SURGERY WITH FUSION NAVIGATION;   Surgeon: Jerrell Belfast, MD;  Location: [place];  Service: ENT;  Laterality: Bilateral;  with fusion scan  ? UPPER GI ENDOSCOPY    ? WISDOM TOOTH EXTRACTION    ? ? ?Current Medications: ?Current Meds  ?Medication Sig  ? estradiol (ESTRACE) 0.5 MG tablet Take 0.5 mg by mouth daily.  ? ibuprofen (ADVIL) 600 MG tablet Take 600 mg by mouth every 6 (six) hours as needed.  ? KLOR-CON M20 20 MEQ tablet TAKE 1 TABLET BY MOUTH TWICE A DAY  ? metoprolol tartrate (LOPRESSOR) 100 MG tablet Take 2 hours prior to CT  ? pantoprazole (PROTONIX) 40 MG tablet Take 40 mg by mouth every morning.  ? propranolol (INDERAL) 20 MG tablet Take 1 tablet (20 mg total) by mouth 3 (three) times daily.  ? Semaglutide-Weight Management (WEGOVY) 0.5 MG/0.5ML SOAJ INJECT 0.5 MG UNDER SKIN EVERY WEEK  ? SUMAtriptan (IMITREX) 50 MG tablet Take 1 tablet (50 mg total) by mouth every 2 (two) hours as needed for migraine or headache. May repeat in 2 hours if headache persists or recurs.  ? triamterene-hydrochlorothiazide (MAXZIDE-25) 37.5-25 MG tablet TAKE 1 TABLET BY MOUTH EVERY DAY  ? [DISCONTINUED] propranolol (INDERAL) 20 MG tablet TAKE 1 TABLET BY MOUTH EVERY 8 HOURS AS NEEDED FOR PALPATIONS OR HEART RATE GREATER THAN 130  ?  ? ?Allergies:   Sulfa antibiotics and Penicillins  ? ?Social History  ? ?Socioeconomic History  ? Marital status: Married  ?  Spouse name: Dellis Filbert  ? Number of children: 4  ? Years of education: Not on file  ? Highest education level: Not on file  ?Occupational History  ? Occupation: Therapist, sports  ?Tobacco Use  ? Smoking status: Former  ?  Packs/day: 1.00  ?  Years: 19.00  ?  Pack years: 19.00  ?  Types: Cigarettes  ?  Quit date: 07/28/2003  ?  Years since quitting: 18.3  ? Smokeless tobacco: Never  ?Substance and Sexual Activity  ? Alcohol use: Not Currently  ?  Comment: occasionally  ? Drug use: Yes  ?  Comment: CBD oil - last week 04/29/21  ? Sexual activity: Yes  ?  Partners: Male  ?  Birth control/protection: None  ?   Comment: lives with husband, stepson 49, son and daughter. no dietary restrictions. eating heart healthy, walking more  ?Other Topics Concern  ? Not on file  ?Social History Narrative  ? Not on file  ? ?Social Determinants of Health  ? ?Financial Resource Strain: Not on file  ?Food Insecurity: Not on file  ?Transportation Needs: Not on file  ?Physical Activity: Not on file  ?Stress: Not on file  ?Social Connections: Not on file  ?  ? ?Family History: ?The patient's family history includes Depression in her mother; Depression (age of onset: 50) in her maternal aunt; Diabetes in her mother; Heart disease in her maternal grandfather; Hypertension in her  mother; Mental illness in her mother; Obesity in her mother; Other in her father; Pulmonary embolism in her mother; Stroke in her maternal grandfather. ? ?ROS:   ?Review of Systems  ?Constitution: Negative for decreased appetite, fever and weight gain.  ?HENT: Negative for congestion, ear discharge, hoarse voice and sore throat.   ?Eyes: Negative for discharge, redness, vision loss in right eye and visual halos.  ?Cardiovascular: Negative for chest pain, dyspnea on exertion, leg swelling, orthopnea and palpitations.  ?Respiratory: Negative for cough, hemoptysis, shortness of breath and snoring.   ?Endocrine: Negative for heat intolerance and polyphagia.  ?Hematologic/Lymphatic: Negative for bleeding problem. Does not bruise/bleed easily.  ?Skin: Negative for flushing, nail changes, rash and suspicious lesions.  ?Musculoskeletal: Negative for arthritis, joint pain, muscle cramps, myalgias, neck pain and stiffness.  ?Gastrointestinal: Negative for abdominal pain, bowel incontinence, diarrhea and excessive appetite.  ?Genitourinary: Negative for decreased libido, genital sores and incomplete emptying.  ?Neurological: Negative for brief paralysis, focal weakness, headaches and loss of balance.  ?Psychiatric/Behavioral: Negative for altered mental status, depression and  suicidal ideas.  ?Allergic/Immunologic: Negative for HIV exposure and persistent infections.  ? ? ?EKGs/Labs/Other Studies Reviewed:   ? ?The following studies were reviewed today: ? ? ?EKG: None today ? ?Fraser Din

## 2021-12-05 ENCOUNTER — Telehealth (HOSPITAL_COMMUNITY): Payer: Self-pay | Admitting: *Deleted

## 2021-12-05 NOTE — Telephone Encounter (Signed)
Reaching out to patient to offer assistance regarding upcoming cardiac imaging study; pt verbalizes understanding of appt date/time, parking situation and where to check in, pre-test NPO status and medications ordered, and verified current allergies; name and call back number provided for further questions should they arise ? ?Gordy Clement RN Navigator Cardiac Imaging ? Heart and Vascular ?[phone] office ?[phone] cell ? ?Patient to take '100mg'$  metoprolol tartrate two hours prior to her cardiac CT Scan.  She is aware to arrive at 8:30am. ?

## 2021-12-07 ENCOUNTER — Other Ambulatory Visit: Payer: Self-pay | Admitting: Family Medicine

## 2021-12-08 NOTE — Progress Notes (Deleted)
Subjective:    Patient ID: Connie Blanchard, female    DOB: 11-29-1970, 51 y.o.   MRN: [id number]  No chief complaint on file.   HPI Patient is in today for her annual physical exam.  Past Medical History:  Diagnosis Date   Acute bronchitis 06/24/2012   Acute bronchitis with asthma 06/24/2012   Allergic state 10/15/2013   Anemia 08/25/2011   Anxiety    Anxiety    not current   At risk for heart disease 04/01/2020   Back pain 10/15/2013   Cervical disc disease 04/22/2015   Chronic sinusitis 02/2012   current runny nose of clear drainage   Colon cancer screening 07/29/2011   Sees Dr Donne Hazel of dermatology in [facility] Dr Philis Pique of GYN for paps and [facility] and denies any concerns   Constipation    Dental crowns present    Depression    Depression with anxiety    in the past    Essential hypertension 10/06/2015   Gallbladder polyp 08/06/2020   GERD (gastroesophageal reflux disease)    H/O gestational diabetes mellitus, not currently pregnant 07/29/2011   h/o    Headache    history of migraines   History of blood transfusion    History of blood transfusion    History of gestational diabetes mellitus (GDM) 03/26/2020   History of hiatal hernia    Hyperglycemia 05/25/2017   Hyperlipidemia, mixed 10/06/2015   Hypertension    Insomnia    Insulin resistance 06/29/2018   Iron deficiency anemia    Laryngopharyngeal reflux (LPR) 02/2012   current runny nose of clear drainage   Left lower quadrant pain 08/06/2020   Left shoulder pain 05/26/2017   Leg edema    Nausea 10/15/2013   Obese    Obesity    Other fatigue 10/12/2017   PCOS (polycystic ovarian syndrome)    Pedal edema 76/54/6503   Periumbilical pain 54/65/6812   Pharyngitis 05/07/2019   Postoperative state 03/17/2017   Right shoulder pain 05/25/2017   Right upper quadrant pain 08/06/2020   Shortness of breath on exertion 10/12/2017   Vitamin D deficiency 03/26/2020    Past Surgical History:   Procedure Laterality Date   ABDOMINAL HYSTERECTOMY  02/2017   anemia, fibroids   CHOLECYSTECTOMY N/A 05/01/2021   Procedure: LAPAROSCOPIC CHOLECYSTECTOMY;  Surgeon: Coralie Keens, MD;  Location: [place];  Service: General;  Laterality: N/A;   COLONOSCOPY     [place] OF UTERUS  1987   NASAL SEPTOPLASTY W/ TURBINOPLASTY  03/23/2012   Procedure: NASAL SEPTOPLASTY WITH TURBINATE REDUCTION;  Surgeon: Jerrell Belfast, MD;  Location: [place];  Service: ENT;  Laterality: Bilateral;   ROBOTIC ASSISTED TOTAL HYSTERECTOMY WITH SALPINGECTOMY N/A 03/17/2017   Procedure: ROBOTIC ASSISTED TOTAL HYSTERECTOMY WITH SALPINGECTOMY;  Surgeon: Bobbye Charleston, MD;  Location: [place] [facility];  Service: Gynecology;  Laterality: N/A;   SINUS ENDO W/FUSION  03/23/2012   Procedure: ENDOSCOPIC SINUS SURGERY WITH FUSION NAVIGATION;  Surgeon: Jerrell Belfast, MD;  Location: [place];  Service: ENT;  Laterality: Bilateral;  with fusion scan   UPPER GI ENDOSCOPY     WISDOM TOOTH EXTRACTION      Family History  Problem Relation Age of Onset   Diabetes Mother        type 2   Hypertension Mother    Depression Mother    Mental illness Mother        bi-polar   Pulmonary embolism Mother    Obesity  Mother    Heart disease Maternal Grandfather    Stroke Maternal Grandfather    Other Father        drug addiction   Depression Maternal Aunt 75       suicide attempt with pneumonia    Social History   Socioeconomic History   Marital status: Married    Spouse name: Dellis Filbert   Number of children: 4   Years of education: Not on file   Highest education level: Not on file  Occupational History   Occupation: RN  Tobacco Use   Smoking status: Former    Packs/day: 1.00    Years: 19.00    Pack years: 19.00    Types: Cigarettes    Quit date: 07/28/2003    Years since quitting: 18.3   Smokeless tobacco: Never  Substance and Sexual Activity   Alcohol use: Not Currently     Comment: occasionally   Drug use: Yes    Comment: CBD oil - last week 04/29/21   Sexual activity: Yes    Partners: Male    Birth control/protection: None    Comment: lives with husband, stepson 33, son and daughter. no dietary restrictions. eating heart healthy, walking more  Other Topics Concern   Not on file  Social History Narrative   Not on file   Social Determinants of Health   Financial Resource Strain: Not on file  Food Insecurity: Not on file  Transportation Needs: Not on file  Physical Activity: Not on file  Stress: Not on file  Social Connections: Not on file  Intimate Partner Violence: Not on file    Outpatient Medications Prior to Visit  Medication Sig Dispense Refill   estradiol (ESTRACE) 0.5 MG tablet Take 0.5 mg by mouth daily.     ibuprofen (ADVIL) 600 MG tablet Take 600 mg by mouth every 6 (six) hours as needed.     KLOR-CON M20 20 MEQ tablet TAKE 1 TABLET BY MOUTH TWICE A DAY 180 tablet 1   metoprolol tartrate (LOPRESSOR) 100 MG tablet Take 2 hours prior to CT 1 tablet 0   pantoprazole (PROTONIX) 40 MG tablet Take 40 mg by mouth every morning.     propranolol (INDERAL) 20 MG tablet Take 1 tablet (20 mg total) by mouth 3 (three) times daily. 270 tablet 3   Semaglutide-Weight Management (WEGOVY) 0.5 MG/0.5ML SOAJ INJECT 0.5 MG UNDER SKIN EVERY WEEK     SUMAtriptan (IMITREX) 50 MG tablet Take 1 tablet (50 mg total) by mouth every 2 (two) hours as needed for migraine or headache. May repeat in 2 hours if headache persists or recurs. 10 tablet 2   triamterene-hydrochlorothiazide (MAXZIDE-25) 37.5-25 MG tablet TAKE 1 TABLET BY MOUTH EVERY DAY 90 tablet 0   No facility-administered medications prior to visit.    Allergies  Allergen Reactions   Sulfa Antibiotics Rash   Penicillins Rash    Has patient had a PCN reaction causing immediate rash, facial/tongue/throat swelling, SOB or lightheadedness with hypotension: Unknown Has patient had a PCN reaction causing  severe rash involving mucus membranes or skin necrosis: Unknown Has patient had a PCN reaction that required hospitalization: No Has patient had a PCN reaction occurring within the last 10 years: No Childhood allergy  If all of the above answers are "NO", then may proceed with Cephalosporin use.     ROS     Objective:    Physical Exam  LMP 03/03/2017 (Exact Date)  Wt Readings from Last 3 Encounters:  11/17/21 231  lb (104.8 kg)  05/01/21 230 lb (104.3 kg)  09/13/20 228 lb (103.4 kg)    Diabetic Foot Exam - Simple   No data filed    Lab Results  Component Value Date   WBC 5.6 05/01/2021   HGB 14.2 05/01/2021   HCT 40.5 05/01/2021   PLT 259 05/01/2021   GLUCOSE 95 11/17/2021   CHOL 228 (H) 07/01/2020   TRIG 82.0 07/01/2020   HDL 67.90 07/01/2020   LDLCALC 144 (H) 07/01/2020   ALT 15 07/01/2020   AST 16 07/01/2020   NA 142 11/17/2021   K 4.0 11/17/2021   CL 98 11/17/2021   CREATININE 0.97 11/17/2021   BUN 9 11/17/2021   CO2 26 11/17/2021   TSH 2.120 09/13/2020   HGBA1C 5.4 07/01/2020    Lab Results  Component Value Date   TSH 2.120 09/13/2020   Lab Results  Component Value Date   WBC 5.6 05/01/2021   HGB 14.2 05/01/2021   HCT 40.5 05/01/2021   MCV 93.8 05/01/2021   PLT 259 05/01/2021   Lab Results  Component Value Date   NA 142 11/17/2021   K 4.0 11/17/2021   CO2 26 11/17/2021   GLUCOSE 95 11/17/2021   BUN 9 11/17/2021   CREATININE 0.97 11/17/2021   BILITOT 0.7 07/01/2020   ALKPHOS 47 07/01/2020   AST 16 07/01/2020   ALT 15 07/01/2020   PROT 8.0 07/01/2020   ALBUMIN 4.3 07/01/2020   CALCIUM 9.6 11/17/2021   ANIONGAP 10 05/01/2021   EGFR 70 11/17/2021   GFR 72.02 07/01/2020   Lab Results  Component Value Date   CHOL 228 (H) 07/01/2020   Lab Results  Component Value Date   HDL 67.90 07/01/2020   Lab Results  Component Value Date   LDLCALC 144 (H) 07/01/2020   Lab Results  Component Value Date   TRIG 82.0 07/01/2020   Lab  Results  Component Value Date   CHOLHDL 3 07/01/2020   Lab Results  Component Value Date   HGBA1C 5.4 07/01/2020       Assessment & Plan:   Problem List Items Addressed This Visit   None   I am having Joelene Millin H. Berline Lopes "Kim" maintain her estradiol, SUMAtriptan, ibuprofen, Klor-Con M20, pantoprazole, Wegovy, metoprolol tartrate, propranolol, and triamterene-hydrochlorothiazide.  No orders of the defined types were placed in this encounter.

## 2021-12-09 ENCOUNTER — Encounter: Payer: 59 | Admitting: Family Medicine

## 2021-12-09 ENCOUNTER — Encounter (HOSPITAL_COMMUNITY): Payer: Self-pay

## 2021-12-09 ENCOUNTER — Ambulatory Visit (HOSPITAL_COMMUNITY)
Admission: RE | Admit: 2021-12-09 | Discharge: 2021-12-09 | Disposition: A | Discharge status: Home / Self Care | Payer: 59 | Source: Ambulatory Visit | Attending: Cardiology | Admitting: Cardiology

## 2021-12-09 DIAGNOSIS — R079 Chest pain, unspecified: Secondary | ICD-10-CM | POA: Insufficient documentation

## 2021-12-09 DIAGNOSIS — I10 Essential (primary) hypertension: Secondary | ICD-10-CM

## 2021-12-09 DIAGNOSIS — E782 Mixed hyperlipidemia: Secondary | ICD-10-CM

## 2021-12-09 DIAGNOSIS — E8881 Metabolic syndrome: Secondary | ICD-10-CM

## 2021-12-09 DIAGNOSIS — Z0000 Encounter for general adult medical examination without abnormal findings: Secondary | ICD-10-CM

## 2021-12-09 MED ORDER — NITROGLYCERIN 0.4 MG SL SUBL
0.8000 mg | SUBLINGUAL_TABLET | Freq: Once | SUBLINGUAL | Status: AC
  Administered 2021-12-09: 0.8 mg via SUBLINGUAL

## 2021-12-09 MED ORDER — NITROGLYCERIN 0.4 MG SL SUBL
SUBLINGUAL_TABLET | SUBLINGUAL | Status: AC
  Filled 2021-12-09: qty 2

## 2021-12-09 MED ORDER — IOHEXOL 350 MG/ML SOLN
100.0000 mL | Freq: Once | INTRAVENOUS | Status: AC | PRN
  Administered 2021-12-09: 100 mL via INTRAVENOUS

## 2021-12-10 ENCOUNTER — Encounter: Payer: Self-pay | Admitting: Cardiology

## 2021-12-10 ENCOUNTER — Telehealth: Payer: Self-pay | Admitting: Family Medicine

## 2021-12-10 DIAGNOSIS — I10 Essential (primary) hypertension: Secondary | ICD-10-CM

## 2021-12-10 DIAGNOSIS — E782 Mixed hyperlipidemia: Secondary | ICD-10-CM

## 2021-12-10 NOTE — Telephone Encounter (Signed)
Pt resched cpe due to [facility] family emergency. Pt would like to know if she would order all labs for cholesterol. Please advise  ?

## 2021-12-12 NOTE — Telephone Encounter (Signed)
Labs ordered, lab appointment made.

## 2021-12-15 ENCOUNTER — Other Ambulatory Visit: Payer: 59

## 2021-12-25 ENCOUNTER — Encounter: Payer: Self-pay | Admitting: Family Medicine

## 2021-12-25 ENCOUNTER — Other Ambulatory Visit (INDEPENDENT_AMBULATORY_CARE_PROVIDER_SITE_OTHER): Payer: 59

## 2021-12-25 DIAGNOSIS — I10 Essential (primary) hypertension: Secondary | ICD-10-CM

## 2021-12-25 DIAGNOSIS — E782 Mixed hyperlipidemia: Secondary | ICD-10-CM | POA: Diagnosis not present

## 2021-12-25 LAB — COMPREHENSIVE METABOLIC PANEL
ALT: 18 U/L (ref 0–35)
AST: 16 U/L (ref 0–37)
Albumin: 3.8 g/dL (ref 3.5–5.2)
Alkaline Phosphatase: 45 U/L (ref 39–117)
BUN: 12 mg/dL (ref 6–23)
CO2: 30 mEq/L (ref 19–32)
Calcium: 8.8 mg/dL (ref 8.4–10.5)
Chloride: 98 mEq/L (ref 96–112)
Creatinine, Ser: 1.08 mg/dL (ref 0.40–1.20)
GFR: 58.8 mL/min — ABNORMAL LOW (ref >60.00)
Glucose, Bld: 93 mg/dL (ref 70–99)
Potassium: 3.8 mEq/L (ref 3.5–5.1)
Sodium: 139 mEq/L (ref 135–145)
Total Bilirubin: 0.5 mg/dL (ref 0.2–1.2)
Total Protein: 6.8 g/dL (ref 6.0–8.3)

## 2021-12-25 LAB — LIPID PANEL
Cholesterol: 179 mg/dL (ref 0–200)
HDL: 48 mg/dL (ref >39.00)
Total CHOL/HDL Ratio: 4
Triglycerides: 403 mg/dL — ABNORMAL HIGH (ref 0.0–149.0)

## 2021-12-25 LAB — CBC
HCT: 38.7 % (ref 36.0–46.0)
Hemoglobin: 13.3 g/dL (ref 12.0–15.0)
MCHC: 34.4 g/dL (ref 30.0–36.0)
MCV: 93.1 fl (ref 78.0–100.0)
Platelets: 216 10*3/uL (ref 150.0–400.0)
RBC: 4.15 Mil/uL (ref 3.87–5.11)
RDW: 13.8 % (ref 11.5–15.5)
WBC: 5.5 10*3/uL (ref 4.0–10.5)

## 2021-12-25 LAB — TSH: TSH: 4.47 u[IU]/mL (ref 0.35–5.50)

## 2021-12-25 LAB — LDL CHOLESTEROL, DIRECT: Direct LDL: 83 mg/dL

## 2021-12-26 NOTE — Addendum Note (Signed)
Addended by: Lynnea Ferrier R on: 12/26/2021 09:05 AM   Modules accepted: Orders

## 2021-12-29 ENCOUNTER — Ambulatory Visit: Payer: 59 | Admitting: Medical

## 2021-12-29 VITALS — BP 133/77 | HR 66 | Temp 98.5°F | Resp 18 | Ht 67.0 in | Wt 230.0 lb

## 2021-12-29 DIAGNOSIS — I10 Essential (primary) hypertension: Secondary | ICD-10-CM

## 2021-12-29 DIAGNOSIS — E782 Mixed hyperlipidemia: Secondary | ICD-10-CM | POA: Diagnosis not present

## 2021-12-29 DIAGNOSIS — K219 Gastro-esophageal reflux disease without esophagitis: Secondary | ICD-10-CM | POA: Diagnosis not present

## 2021-12-29 DIAGNOSIS — R739 Hyperglycemia, unspecified: Secondary | ICD-10-CM | POA: Diagnosis not present

## 2021-12-29 NOTE — Progress Notes (Signed)
Subjective:    Patient ID: Connie Blanchard, female    DOB: [birth year], 51 y.o.   MRN: [id number]  HPI Pt has htn- on maxzide. 25-37.5 daily also on propanolol 20 mg tid.   Gerd- pt on dexilant 60 mg daily. Resolved her symptoms.   Pt starting back on wegovy. Starting back on wegovy.   Migraines treated with imitrex. If takes early enough will resolve HA.  4 days ago lipid down. Triglycerides were elevated. Pt was offered to consider crestor low dose. Pt wants to improve her diet and recheck her diet. Pt admits sometimes eating a lot of processed carbohydrates.    Review of Systems  Constitutional:  Negative for chills, fatigue and fever.  HENT:  Negative for congestion, drooling and ear discharge.   Respiratory:  Negative for cough, chest tightness, shortness of breath and wheezing.   Cardiovascular:  Negative for chest pain and palpitations.  Gastrointestinal:  Negative for abdominal pain.  Genitourinary:  Negative for dysuria and enuresis.  Musculoskeletal:  Negative for back pain.  Neurological:  Negative for dizziness and headaches.  Hematological:  Negative for adenopathy. Does not bruise/bleed easily.  Psychiatric/Behavioral:  Negative for behavioral problems and decreased concentration.    Past Medical History:  Diagnosis Date   Acute bronchitis 06/24/2012   Acute bronchitis with asthma 06/24/2012   Allergic state 10/15/2013   Anemia 08/25/2011   Anxiety    Anxiety    not current   At risk for heart disease 04/01/2020   Back pain 10/15/2013   Cervical disc disease 04/22/2015   Chronic sinusitis 02/2012   current runny nose of clear drainage   Colon cancer screening 07/29/2011   Sees Dr Donne Hazel of dermatology in [place] of [facility] for paps and [facility] and denies any concerns   Constipation    Dental crowns present    Depression    Depression with anxiety    in the past    Essential hypertension 10/06/2015   Gallbladder polyp 08/06/2020    GERD (gastroesophageal reflux disease)    H/O gestational diabetes mellitus, not currently pregnant 07/29/2011   h/o    Headache    history of migraines   History of blood transfusion    History of blood transfusion    History of gestational diabetes mellitus (GDM) 03/26/2020   History of hiatal hernia    Hyperglycemia 05/25/2017   Hyperlipidemia, mixed 10/06/2015   Hypertension    Insomnia    Insulin resistance 06/29/2018   Iron deficiency anemia    Laryngopharyngeal reflux (LPR) 02/2012   current runny nose of clear drainage   Left lower quadrant pain 08/06/2020   Left shoulder pain 05/26/2017   Leg edema    Nausea 10/15/2013   Obese    Obesity    Other fatigue 10/12/2017   PCOS (polycystic ovarian syndrome)    Pedal edema 61/95/0932   Periumbilical pain 67/06/4579   Pharyngitis 05/07/2019   Postoperative state 03/17/2017   Right shoulder pain 05/25/2017   Right upper quadrant pain 08/06/2020   Shortness of breath on exertion 10/12/2017   Vitamin D deficiency 03/26/2020     Social History   Socioeconomic History   Marital status: Married    Spouse name: Dellis Filbert   Number of children: 4   Years of education: Not on file   Highest education level: Not on file  Occupational History   Occupation: RN  Tobacco Use   Smoking status: Former    Packs/day: 1.00  Years: 19.00    Pack years: 19.00    Types: Cigarettes    Quit date: 07/28/2003    Years since quitting: 18.4   Smokeless tobacco: Never  Substance and Sexual Activity   Alcohol use: Not Currently    Comment: occasionally   Drug use: Yes    Comment: CBD oil - last week 04/29/21   Sexual activity: Yes    Partners: Male    Birth control/protection: None    Comment: lives with husband, stepson 23, son and daughter. no dietary restrictions. eating heart healthy, walking more  Other Topics Concern   Not on file  Social History Narrative   Not on file   Social Determinants of Health   Financial Resource  Strain: Not on file  Food Insecurity: Not on file  Transportation Needs: Not on file  Physical Activity: Not on file  Stress: Not on file  Social Connections: Not on file  Intimate Partner Violence: Not on file    Past Surgical History:  Procedure Laterality Date   ABDOMINAL HYSTERECTOMY  02/2017   anemia, fibroids   CHOLECYSTECTOMY N/A 05/01/2021   Procedure: LAPAROSCOPIC CHOLECYSTECTOMY;  Surgeon: Coralie Keens, MD;  Location: [place];  Service: General;  Laterality: N/A;   COLONOSCOPY     DILATION AND CURETTAGE OF UTERUS  1987   NASAL SEPTOPLASTY W/ TURBINOPLASTY  03/23/2012   Procedure: NASAL SEPTOPLASTY WITH TURBINATE REDUCTION;  Surgeon: Jerrell Belfast, MD;  Location: [place];  Service: ENT;  Laterality: Bilateral;   ROBOTIC ASSISTED TOTAL HYSTERECTOMY WITH SALPINGECTOMY N/A 03/17/2017   Procedure: ROBOTIC ASSISTED TOTAL HYSTERECTOMY WITH SALPINGECTOMY;  Surgeon: Bobbye Charleston, MD;  Location: [facility];  Service: Gynecology;  Laterality: N/A;   SINUS ENDO W/FUSION  03/23/2012   Procedure: ENDOSCOPIC SINUS SURGERY WITH FUSION NAVIGATION;  Surgeon: Jerrell Belfast, MD;  Location: [place];  Service: ENT;  Laterality: Bilateral;  with fusion scan   UPPER GI ENDOSCOPY     WISDOM TOOTH EXTRACTION      Family History  Problem Relation Age of Onset   Diabetes Mother        type 2   Hypertension Mother    Depression Mother    Mental illness Mother        bi-polar   Pulmonary embolism Mother    Obesity Mother    Heart disease Maternal Grandfather    Stroke Maternal Grandfather    Other Father        drug addiction   Depression Maternal Aunt 72       suicide attempt with pneumonia    Allergies  Allergen Reactions   Sulfa Antibiotics Rash   Penicillins Rash    Has patient had a PCN reaction causing immediate rash, facial/tongue/throat swelling, SOB or lightheadedness with hypotension: Unknown Has patient had a PCN reaction causing severe  rash involving mucus membranes or skin necrosis: Unknown Has patient had a PCN reaction that required hospitalization: No Has patient had a PCN reaction occurring within the last 10 years: No Childhood allergy  If all of the above answers are "NO", then may proceed with Cephalosporin use.     Current Outpatient Medications on File Prior to Visit  Medication Sig Dispense Refill   dexlansoprazole (DEXILANT) 60 MG capsule Take 1 capsule by mouth every morning.     estradiol (ESTRACE) 0.5 MG tablet Take 0.5 mg by mouth daily.     KLOR-CON M20 20 MEQ tablet TAKE 1 TABLET BY MOUTH TWICE A DAY  180 tablet 1   ondansetron (ZOFRAN-ODT) 8 MG disintegrating tablet Take 8 mg by mouth 2 (two) times daily as needed.     propranolol (INDERAL) 20 MG tablet Take 1 tablet (20 mg total) by mouth 3 (three) times daily. 270 tablet 3   Semaglutide-Weight Management (WEGOVY) 0.5 MG/0.5ML SOAJ INJECT 0.5 MG UNDER SKIN EVERY WEEK     SUMAtriptan (IMITREX) 50 MG tablet Take 1 tablet (50 mg total) by mouth every 2 (two) hours as needed for migraine or headache. May repeat in 2 hours if headache persists or recurs. 10 tablet 2   triamterene-hydrochlorothiazide (MAXZIDE-25) 37.5-25 MG tablet TAKE 1 TABLET BY MOUTH EVERY DAY 90 tablet 0   No current facility-administered medications on file prior to visit.    BP 133/77   Pulse 66   Temp 98.5 F (36.9 C)   Resp 18   Ht '5\' 7"'$  (1.702 m)   Wt 230 lb (104.3 kg)   LMP 03/03/2017 (Exact Date)   SpO2 96%   BMI 36.02 kg/m        Objective:   Physical Exam  General Mental Status- Alert. General Appearance- Not in acute distress.   Skin General: Color- Normal Color. Moisture- Normal Moisture.  Neck Carotid Arteries- Normal color. Moisture- Normal Moisture. No carotid bruits. No JVD.  Chest and Lung Exam Auscultation: Breath Sounds:-Normal.  Cardiovascular Auscultation:Rythm- Regular. Murmurs & Other Heart Sounds:Auscultation of the heart reveals- No  Murmurs.  Abdomen Inspection:-Inspeection Normal. Palpation/Percussion:Note:No mass. Palpation and Percussion of the abdomen reveal- Non Tender, Non Distended + BS, no rebound or guarding.   Neurologic Cranial Nerve exam:- CN III-XII intact(No nystagmus), symmetric smile. Strength:- 5/5 equal and symmetric strength both upper and lower extremities.       Assessment & Plan:   Patient Instructions  Htn- bp well controlled. Continue maxzide and propanolol.  Gerd- controlled on dexilant.  For obesity continue wegovy.  For migraines well controlled when using imitrex.  For high triglycerides concentrate on low processed carbohydrate diet and low cholesterol diet. Can use fish oil and krill oil. Future lipid panel placed to do in 3 months.  Follow up date to be determined after lipid panel review.   Mackie Pai, PA-C

## 2021-12-29 NOTE — Patient Instructions (Addendum)
Htn- bp well controlled. Continue maxzide and propanolol.  Gerd- controlled on dexilant.  For obesity continue wegovy.  For migraines well controlled when using imitrex.  For high triglycerides concentrate on low processed carbohydrate diet and low cholesterol diet. Can use fish oil and krill oil. Future lipid panel placed to do in 3 months.  Follow up date to be determined after lipid panel review.

## 2022-02-26 DIAGNOSIS — Z0289 Encounter for other administrative examinations: Secondary | ICD-10-CM

## 2022-03-04 ENCOUNTER — Encounter (INDEPENDENT_AMBULATORY_CARE_PROVIDER_SITE_OTHER): Payer: Self-pay

## 2022-03-11 ENCOUNTER — Ambulatory Visit (INDEPENDENT_AMBULATORY_CARE_PROVIDER_SITE_OTHER): Payer: 59 | Admitting: Bariatrics

## 2022-03-11 ENCOUNTER — Encounter (INDEPENDENT_AMBULATORY_CARE_PROVIDER_SITE_OTHER): Payer: Self-pay | Admitting: Bariatrics

## 2022-03-11 VITALS — BP 101/69 | HR 65 | Temp 98.0°F | Ht 67.0 in | Wt 224.0 lb

## 2022-03-11 DIAGNOSIS — E559 Vitamin D deficiency, unspecified: Secondary | ICD-10-CM

## 2022-03-11 DIAGNOSIS — E282 Polycystic ovarian syndrome: Secondary | ICD-10-CM

## 2022-03-11 DIAGNOSIS — R5383 Other fatigue: Secondary | ICD-10-CM

## 2022-03-11 DIAGNOSIS — E782 Mixed hyperlipidemia: Secondary | ICD-10-CM

## 2022-03-11 DIAGNOSIS — Z6835 Body mass index (BMI) 35.0-35.9, adult: Secondary | ICD-10-CM

## 2022-03-11 DIAGNOSIS — E8881 Metabolic syndrome: Secondary | ICD-10-CM

## 2022-03-11 DIAGNOSIS — Z1331 Encounter for screening for depression: Secondary | ICD-10-CM | POA: Insufficient documentation

## 2022-03-11 DIAGNOSIS — I10 Essential (primary) hypertension: Secondary | ICD-10-CM | POA: Diagnosis not present

## 2022-03-11 DIAGNOSIS — E781 Pure hyperglyceridemia: Secondary | ICD-10-CM | POA: Insufficient documentation

## 2022-03-11 DIAGNOSIS — R0602 Shortness of breath: Secondary | ICD-10-CM | POA: Diagnosis not present

## 2022-03-11 DIAGNOSIS — K219 Gastro-esophageal reflux disease without esophagitis: Secondary | ICD-10-CM | POA: Diagnosis not present

## 2022-03-11 DIAGNOSIS — Z0000 Encounter for general adult medical examination without abnormal findings: Secondary | ICD-10-CM | POA: Insufficient documentation

## 2022-03-11 DIAGNOSIS — E669 Obesity, unspecified: Secondary | ICD-10-CM

## 2022-03-12 LAB — INSULIN, RANDOM: INSULIN: 18 u[IU]/mL (ref 2.6–24.9)

## 2022-03-12 LAB — COMPREHENSIVE METABOLIC PANEL
ALT: 11 IU/L (ref 0–32)
AST: 14 IU/L (ref 0–40)
Albumin/Globulin Ratio: 1.3 (ref 1.2–2.2)
Albumin: 4.2 g/dL (ref 3.8–4.9)
Alkaline Phosphatase: 63 IU/L (ref 44–121)
BUN/Creatinine Ratio: 14 (ref 9–23)
BUN: 12 mg/dL (ref 6–24)
Bilirubin Total: 0.3 mg/dL (ref 0.0–1.2)
CO2: 24 mmol/L (ref 20–29)
Calcium: 9.2 mg/dL (ref 8.7–10.2)
Chloride: 98 mmol/L (ref 96–106)
Creatinine, Ser: 0.83 mg/dL (ref 0.57–1.00)
Globulin, Total: 3.3 g/dL (ref 1.5–4.5)
Glucose: 83 mg/dL (ref 70–99)
Potassium: 4 mmol/L (ref 3.5–5.2)
Sodium: 138 mmol/L (ref 134–144)
Total Protein: 7.5 g/dL (ref 6.0–8.5)
eGFR: 84 mL/min/{1.73_m2} (ref >59)

## 2022-03-12 LAB — HEMOGLOBIN A1C
Est. average glucose Bld gHb Est-mCnc: 105 mg/dL
Hgb A1c MFr Bld: 5.3 % (ref 4.8–5.6)

## 2022-03-12 LAB — LIPID PANEL WITH LDL/HDL RATIO
Cholesterol, Total: 183 mg/dL (ref 100–199)
HDL: 56 mg/dL (ref >39)
LDL Chol Calc (NIH): 98 mg/dL (ref 0–99)
LDL/HDL Ratio: 1.8 ratio (ref 0.0–3.2)
Triglycerides: 167 mg/dL — ABNORMAL HIGH (ref 0–149)
VLDL Cholesterol Cal: 29 mg/dL (ref 5–40)

## 2022-03-12 LAB — VITAMIN D 25 HYDROXY (VIT D DEFICIENCY, FRACTURES): Vit D, 25-Hydroxy: 42.8 ng/mL (ref 30.0–100.0)

## 2022-03-19 ENCOUNTER — Encounter (INDEPENDENT_AMBULATORY_CARE_PROVIDER_SITE_OTHER): Payer: Self-pay | Admitting: Bariatrics

## 2022-03-19 NOTE — Progress Notes (Signed)
Chief Complaint:   OBESITY Connie Blanchard (MR# [id number]) is a 51 y.o. female who presents for evaluation and treatment of obesity and related comorbidities. Current BMI is Body mass index is 35.08 kg/m. Connie Blanchard has been struggling with her weight for many years and has been unsuccessful in either losing weight, maintaining weight loss, or reaching her healthy weight goal.  Connie Blanchard states that she likes to cook.  She craves carbohydrates and salty foods.  She was with Dr. Leafy Blanchard and Dr. Jearld Blanchard previously.  Connie Blanchard is currently in the action stage of change and ready to dedicate time achieving and maintaining a healthier weight. Connie Blanchard is interested in becoming our patient and working on intensive lifestyle modifications including (but not limited to) diet and exercise for weight loss.  Connie Blanchard's habits were reviewed today and are as follows: Her family eats meals together, she thinks her family will eat healthier with her, her desired weight loss is 44 lbs, she has been heavy most of her life, she started gaining weight after her first son in 2006, her heaviest weight ever was 247 pounds, she has significant food cravings issues, she snacks frequently in the evenings, she skips meals frequently, she is frequently drinking liquids with calories, she frequently makes poor food choices, she has problems with excessive hunger, she frequently eats larger portions than normal, and she struggles with emotional eating.  Depression Screen Connie Blanchard's Food and Mood (modified PHQ-9) score was 19.     03/11/2022    7:51 AM  Depression screen PHQ 2/9  Decreased Interest 3  Down, Depressed, Hopeless 3  PHQ - 2 Score 6  Altered sleeping 3  Tired, decreased energy 3  Change in appetite 2  Feeling bad or failure about yourself  3  Trouble concentrating 2  Moving slowly or fidgety/restless 0  Suicidal thoughts 0  PHQ-9 Score 19  Difficult doing work/chores Somewhat difficult   Subjective:    1. Other fatigue Connie Blanchard admits to daytime somnolence and admits to waking up still tired. Patient has a history of symptoms of daytime fatigue, morning fatigue, and morning headache. Connie Blanchard generally gets 6 or 7 hours of sleep per night, and states that she has nightime awakenings. Snoring is present. Apneic episodes are not present. Epworth Sleepiness Score is 2.   2. SOB (shortness of breath) on exertion Connie Blanchard notes increasing shortness of breath with exercising and seems to be worsening over time with weight gain. She notes getting out of breath sooner with activity than she used to. This has not gotten worse recently. Connie Blanchard denies shortness of breath at rest or orthopnea.  3. Essential hypertension Connie Blanchard is taking propranolol and Maxide.  Well-controlled lipids and CMP.  4. Gastroesophageal reflux disease, unspecified whether esophagitis present Connie Blanchard is currently taking Dexilant.  5. Health care maintenance Given obesity with comorbidity.  6. PCOS (polycystic ovarian syndrome) Connie Blanchard is currently taking berberine.  7. Insulin resistance Connie Blanchard is currently taking berberine.  She has a history of gestational diabetes mellitus and a history of polycystic ovarian syndrome.  8. Hyperlipidemia, mixed Connie Blanchard is currently taking Krill oil.  9. Vitamin D deficiency Connie Blanchard is not on vitamin D supplementation.  10. Hypertriglyceridemia Connie Blanchard is currently taking berberine.  Assessment/Plan:   1. Other fatigue Connie Blanchard does feel that her weight is causing her energy to be lower than it should be. Fatigue may be related to obesity, depression or many other causes. Labs will be ordered, and in the meanwhile, Connie Blanchard will focus  on self care including making healthy food choices, increasing physical activity and focusing on stress reduction.  - Comprehensive metabolic panel - Hemoglobin A1c  2. SOB (shortness of breath) on exertion Connie Blanchard does feel that  she gets out of breath more easily that she used to when she exercises. Connie Blanchard shortness of breath appears to be obesity related and exercise induced. She has agreed to work on weight loss and gradually increase exercise to treat her exercise induced shortness of breath. Will continue to monitor closely.  3. Essential hypertension Connie Blanchard will continue her medications as directed.  She will work on eliminating added salt.  4. Gastroesophageal reflux disease, unspecified whether esophagitis present Connie Blanchard will continue Dexilant as directed, and she is to avoid trigger foods.  5. Health care maintenance We will check labs today.  EKG and IC were reviewed with the patient.  6. PCOS (polycystic ovarian syndrome) We will check labs today.  Connie Blanchard will continue berberine as directed.  - Hemoglobin A1c - Insulin, random  7. Insulin resistance We will check labs today.  Connie Blanchard will continue berberine as directed.  - Hemoglobin A1c - Insulin, random  8. Hyperlipidemia, mixed We will check labs today.  Connie Blanchard will continue taking Krill oil.  She will work on eliminating trans fats, and increase MUFA's and PUFA's.  - Lipid Panel With LDL/HDL Ratio  9. Vitamin D deficiency We will check labs today, and we will follow-up at [place] next visit.  - VITAMIN D 25 Hydroxy (Vit-D Deficiency, Fractures)  10. Hypertriglyceridemia We will check labs today.  We discussed the diagnosis in depth.  - Comprehensive metabolic panel - Lipid Panel With LDL/HDL Ratio  11. Depression screening Connie Blanchard had a positive depression screening. Depression is commonly associated with obesity and often results in emotional eating behaviors. We will monitor this closely and work on CBT to help improve the non-hunger eating patterns. Referral to Psychology may be required if no improvement is seen as she continues in our clinic.  12. Obesity, Current BMI 35.1 Connie Blanchard is currently in the action  stage of change and her goal is to continue with weight loss efforts. I recommend Connie Blanchard begin the structured treatment plan as follows:  She has agreed to the Category 2 Plan.  Meal planning and intentional eating were discussed.  Reviewed labs with the patient from 12/25/2021, CMP, lipids, CBC, glucose, and TSH.  Eliminate sugary drinks.  Exercise goals: No exercise has been prescribed at this time.   Behavioral modification strategies: increasing lean protein intake, decreasing simple carbohydrates, increasing vegetables, increasing water intake, decreasing eating out, no skipping meals, meal planning and cooking strategies, keeping healthy foods in the home, and planning for success.  She was informed of the importance of frequent follow-up visits to maximize her success with intensive lifestyle modifications for her multiple health conditions. She was informed we would discuss her lab results at her next visit unless there is a critical issue that needs to be addressed sooner. Connie Blanchard agreed to keep her next visit at the agreed upon time to discuss these results.  Objective:   Blood pressure 101/69, pulse 65, temperature 98 F (36.7 C), height '5\' 7"'$  (1.702 m), weight 224 lb (101.6 kg), last menstrual period 03/03/2017, SpO2 97 %. Body mass index is 35.08 kg/m.  EKG: Normal sinus rhythm, rate 84 BPM.  Indirect Calorimeter completed today shows a VO2 of 267 and a REE of 1843.  Her calculated basal metabolic rate is 6073 thus her basal metabolic rate is  worse than expected.  General: Cooperative, alert, well developed, in no acute distress. HEENT: Conjunctivae and lids unremarkable. Cardiovascular: Regular rhythm.  Lungs: Normal work of breathing. Neurologic: No focal deficits.   Lab Results  Component Value Date   CREATININE 0.83 03/11/2022   BUN 12 03/11/2022   NA 138 03/11/2022   K 4.0 03/11/2022   CL 98 03/11/2022   CO2 24 03/11/2022   Lab Results  Component Value Date    ALT 11 03/11/2022   AST 14 03/11/2022   ALKPHOS 63 03/11/2022   BILITOT 0.3 03/11/2022   Lab Results  Component Value Date   HGBA1C 5.3 03/11/2022   HGBA1C 5.4 07/01/2020   HGBA1C 5.5 03/26/2020   HGBA1C 5.3 03/16/2019   HGBA1C 5.3 09/27/2018   Lab Results  Component Value Date   INSULIN 18.0 03/11/2022   INSULIN 10.8 03/26/2020   INSULIN 23.4 03/16/2019   INSULIN 13.9 09/27/2018   INSULIN 16.0 06/07/2018   Lab Results  Component Value Date   TSH 4.47 12/25/2021   Lab Results  Component Value Date   CHOL 183 03/11/2022   HDL 56 03/11/2022   LDLCALC 98 03/11/2022   LDLDIRECT 83.0 12/25/2021   TRIG 167 (H) 03/11/2022   CHOLHDL 4 12/25/2021   Lab Results  Component Value Date   WBC 5.5 12/25/2021   HGB 13.3 12/25/2021   HCT 38.7 12/25/2021   MCV 93.1 12/25/2021   PLT 216.0 12/25/2021   Lab Results  Component Value Date   IRON 116 05/25/2017   TIBC 397 10/29/2011   Attestation Statements:   Reviewed by clinician on day of visit: allergies, medications, problem list, medical history, surgical history, family history, social history, and previous encounter notes.   Wilhemena Durie, am acting as Location manager for [employer], DO.  I have reviewed the above documentation for accuracy and completeness, and I agree with the above. Connie Lesch, DO

## 2022-03-25 ENCOUNTER — Ambulatory Visit (INDEPENDENT_AMBULATORY_CARE_PROVIDER_SITE_OTHER): Payer: 59 | Admitting: Bariatrics

## 2022-03-25 ENCOUNTER — Encounter (INDEPENDENT_AMBULATORY_CARE_PROVIDER_SITE_OTHER): Payer: Self-pay | Admitting: Bariatrics

## 2022-03-25 VITALS — BP 107/70 | HR 67 | Temp 97.9°F | Ht 67.0 in | Wt 226.0 lb

## 2022-03-25 DIAGNOSIS — E669 Obesity, unspecified: Secondary | ICD-10-CM

## 2022-03-25 DIAGNOSIS — Z6835 Body mass index (BMI) 35.0-35.9, adult: Secondary | ICD-10-CM | POA: Diagnosis not present

## 2022-03-25 DIAGNOSIS — E8881 Metabolic syndrome: Secondary | ICD-10-CM | POA: Diagnosis not present

## 2022-03-25 DIAGNOSIS — E781 Pure hyperglyceridemia: Secondary | ICD-10-CM

## 2022-03-26 ENCOUNTER — Encounter: Payer: Self-pay | Admitting: Family Medicine

## 2022-03-27 ENCOUNTER — Other Ambulatory Visit: Payer: 59

## 2022-03-29 ENCOUNTER — Other Ambulatory Visit: Payer: Self-pay | Admitting: Family Medicine

## 2022-04-06 NOTE — Progress Notes (Unsigned)
Chief Complaint:   OBESITY Connie Blanchard is here to discuss her progress with her obesity treatment plan along with follow-up of her obesity related diagnoses. Connie Blanchard is on the Category 2 Plan and states she is following her eating plan approximately 25% of the time. Connie Blanchard states she is walking for 30 minutes 1-2 times per week.  Today's visit was #: 2 Starting weight: 224 lbs Starting date: 03/11/2022 Today's weight: 226 lbs Today's date: 03/25/2022 Total lbs lost to date: 0 Total lbs lost since last in-office visit: 0  Interim History: Connie Blanchard is up 2 pounds since her first visit.  She had some GI problems and ate different food.  Subjective:   1. Hypertriglyceridemia Connie Blanchard's triglycerides were elevated at 403.0 previously, and are now at 167.  2. Insulin resistance Connie Blanchard's insulin level is 18.0.  She is taking Barberries.    Assessment/Plan:   1. Hypertriglyceridemia Connie Blanchard will continue to work on her meal plan and exercise.  2. Insulin resistance Connie Blanchard will continue taking Barberries.   3. Obesity, Current BMI 35.5 Connie Blanchard is currently in the action stage of change. As such, her goal is to continue with weight loss efforts. She has agreed to the Category 2 Plan.   She will hear strictly to the plan 80-90%.  Meal planning was discussed.  Review labs with the patient from 03/11/2022, CMP, lipids, vitamin D, A1c, and 02/02/2022 insulin.  We will continue to journal and increase water.  Exercise goals: As is.   Behavioral modification strategies: increasing lean protein intake, decreasing simple carbohydrates, increasing vegetables, increasing water intake, decreasing eating out, no skipping meals, meal planning and cooking strategies, keeping healthy foods in the home, and planning for success.  Connie Blanchard has agreed to follow-up with our clinic in 2 to 3 weeks. She was informed of the importance of frequent follow-up visits to maximize her success with  intensive lifestyle modifications for her multiple health conditions.   Objective:   Blood pressure 107/70, pulse 67, temperature 97.9 F (36.6 C), height '5\' 7"'$  (1.702 m), weight 226 lb (102.5 kg), last menstrual period 03/03/2017, SpO2 98 %. Body mass index is 35.4 kg/m.  General: Cooperative, alert, well developed, in no acute distress. HEENT: Conjunctivae and lids unremarkable. Cardiovascular: Regular rhythm.  Lungs: Normal work of breathing. Neurologic: No focal deficits.   Lab Results  Component Value Date   CREATININE 0.83 03/11/2022   BUN 12 03/11/2022   NA 138 03/11/2022   K 4.0 03/11/2022   CL 98 03/11/2022   CO2 24 03/11/2022   Lab Results  Component Value Date   ALT 11 03/11/2022   AST 14 03/11/2022   ALKPHOS 63 03/11/2022   BILITOT 0.3 03/11/2022   Lab Results  Component Value Date   HGBA1C 5.3 03/11/2022   HGBA1C 5.4 07/01/2020   HGBA1C 5.5 03/26/2020   HGBA1C 5.3 03/16/2019   HGBA1C 5.3 09/27/2018   Lab Results  Component Value Date   INSULIN 18.0 03/11/2022   INSULIN 10.8 03/26/2020   INSULIN 23.4 03/16/2019   INSULIN 13.9 09/27/2018   INSULIN 16.0 06/07/2018   Lab Results  Component Value Date   TSH 4.47 12/25/2021   Lab Results  Component Value Date   CHOL 183 03/11/2022   HDL 56 03/11/2022   LDLCALC 98 03/11/2022   LDLDIRECT 83.0 12/25/2021   TRIG 167 (H) 03/11/2022   CHOLHDL 4 12/25/2021   Lab Results  Component Value Date   VD25OH 42.8 03/11/2022   VD25OH 35.2 03/26/2020  VD25OH 43.9 03/16/2019   Lab Results  Component Value Date   WBC 5.5 12/25/2021   HGB 13.3 12/25/2021   HCT 38.7 12/25/2021   MCV 93.1 12/25/2021   PLT 216.0 12/25/2021   Lab Results  Component Value Date   IRON 116 05/25/2017   TIBC 397 10/29/2011   Attestation Statements:   Reviewed by clinician on day of visit: allergies, medications, problem list, medical history, surgical history, family history, social history, and previous encounter  notes.   Wilhemena Durie, am acting as Location manager for [employer], DO.  I have reviewed the above documentation for accuracy and completeness, and I agree with the above. Jearld Lesch, DO

## 2022-04-08 ENCOUNTER — Encounter (INDEPENDENT_AMBULATORY_CARE_PROVIDER_SITE_OTHER): Payer: Self-pay | Admitting: Bariatrics

## 2022-04-14 ENCOUNTER — Ambulatory Visit: Payer: Self-pay | Admitting: Bariatrics

## 2022-04-15 ENCOUNTER — Encounter: Payer: Self-pay | Admitting: Bariatrics

## 2022-04-15 ENCOUNTER — Ambulatory Visit: Payer: 59 | Admitting: Bariatrics

## 2022-04-15 VITALS — BP 113/72 | HR 61 | Temp 97.9°F | Ht 67.0 in | Wt 224.0 lb

## 2022-04-15 DIAGNOSIS — E8881 Metabolic syndrome: Secondary | ICD-10-CM

## 2022-04-15 DIAGNOSIS — E785 Hyperlipidemia, unspecified: Secondary | ICD-10-CM | POA: Diagnosis not present

## 2022-04-15 DIAGNOSIS — E669 Obesity, unspecified: Secondary | ICD-10-CM | POA: Diagnosis not present

## 2022-04-15 DIAGNOSIS — E88819 Insulin resistance, unspecified: Secondary | ICD-10-CM

## 2022-04-15 DIAGNOSIS — E66812 Obesity, class 2: Secondary | ICD-10-CM

## 2022-04-15 DIAGNOSIS — Z6835 Body mass index (BMI) 35.0-35.9, adult: Secondary | ICD-10-CM

## 2022-04-16 ENCOUNTER — Encounter: Payer: Self-pay | Admitting: Bariatrics

## 2022-04-16 NOTE — Progress Notes (Signed)
Chief Complaint:   OBESITY Connie Blanchard is here to discuss her progress with her obesity treatment plan along with follow-up of her obesity related diagnoses. Connie Blanchard is on the Category 2 Plan and states she is following her eating plan approximately 75-80% of the time. Connie Blanchard states she is riding her bike for 45 minutes 2-3 times per week.  Today's visit was #: 3 Starting weight: 224 lbs Starting date: 03/11/2022 Today's weight: 224 lbs Today's date: 04/15/22 Total lbs lost to date: 0 Total lbs lost since last in-office visit: -2  Interim History: She is down another 2 pounds since her last visit.  She is trying to get the protein down.  Subjective:   1. Hyperlipidemia, unspecified hyperlipidemia type No medication.  2. Insulin resistance No medication.   Assessment/Plan:   1. Hyperlipidemia, unspecified hyperlipidemia type 1.  No trans fats 2.Increase MUFAs and PUFAs.  2. Insulin resistance 1.  Increase fiber 2.  Increase water.  3. Obesity, Current BMI 35.2 1.  Meal planning 2.  Will adhere closely to the plan 80 to 90%. 3.  Increase fiber-25 to 30 g/day.  Connie Blanchard is currently in the action stage of change. As such, her goal is to continue with weight loss efforts. She has agreed to the Category 2 Plan.   Exercise goals: Walk/bike for 45 minutes 3 days/week.  Behavioral modification strategies: increasing lean protein intake, decreasing simple carbohydrates, increasing vegetables, increasing water intake, decreasing eating out, no skipping meals, meal planning and cooking strategies, keeping healthy foods in the home, and planning for success.  Connie Blanchard has agreed to follow-up with our clinic in 2-3 weeks. She was informed of the importance of frequent follow-up visits to maximize her success with intensive lifestyle modifications for her multiple health conditions.   Objective:   Blood pressure 113/72, pulse 61, temperature 97.9 F (36.6 C), height '5\' 7"'$   (1.702 m), weight 224 lb (101.6 kg), last menstrual period 03/03/2017, SpO2 99 %. Body mass index is 35.08 kg/m.  General: Cooperative, alert, well developed, in no acute distress. HEENT: Conjunctivae and lids unremarkable. Cardiovascular: Regular rhythm.  Lungs: Normal work of breathing. Neurologic: No focal deficits.   Lab Results  Component Value Date   CREATININE 0.83 03/11/2022   BUN 12 03/11/2022   NA 138 03/11/2022   K 4.0 03/11/2022   CL 98 03/11/2022   CO2 24 03/11/2022   Lab Results  Component Value Date   ALT 11 03/11/2022   AST 14 03/11/2022   ALKPHOS 63 03/11/2022   BILITOT 0.3 03/11/2022   Lab Results  Component Value Date   HGBA1C 5.3 03/11/2022   HGBA1C 5.4 07/01/2020   HGBA1C 5.5 03/26/2020   HGBA1C 5.3 03/16/2019   HGBA1C 5.3 09/27/2018   Lab Results  Component Value Date   INSULIN 18.0 03/11/2022   INSULIN 10.8 03/26/2020   INSULIN 23.4 03/16/2019   INSULIN 13.9 09/27/2018   INSULIN 16.0 06/07/2018   Lab Results  Component Value Date   TSH 4.47 12/25/2021   Lab Results  Component Value Date   CHOL 183 03/11/2022   HDL 56 03/11/2022   LDLCALC 98 03/11/2022   LDLDIRECT 83.0 12/25/2021   TRIG 167 (H) 03/11/2022   CHOLHDL 4 12/25/2021   Lab Results  Component Value Date   VD25OH 42.8 03/11/2022   VD25OH 35.2 03/26/2020   VD25OH 43.9 03/16/2019   Lab Results  Component Value Date   WBC 5.5 12/25/2021   HGB 13.3 12/25/2021   HCT  38.7 12/25/2021   MCV 93.1 12/25/2021   PLT 216.0 12/25/2021   Lab Results  Component Value Date   IRON 116 05/25/2017   TIBC 397 10/29/2011     Attestation Statements:   Reviewed by clinician on day of visit: allergies, medications, problem list, medical history, surgical history, family history, social history, and previous encounter notes.  I, Dawn Whitmire, FNP-C, am acting as transcriptionist for Dr. Jearld Lesch.  I have reviewed the above documentation for accuracy and completeness, and I  agree with the above. Jearld Lesch, DO

## 2022-05-05 ENCOUNTER — Ambulatory Visit: Payer: 59 | Admitting: Bariatrics

## 2022-05-05 ENCOUNTER — Encounter: Payer: Self-pay | Admitting: Bariatrics

## 2022-05-05 VITALS — BP 108/70 | HR 72 | Temp 98.0°F | Ht 67.0 in | Wt 225.0 lb

## 2022-05-05 DIAGNOSIS — R632 Polyphagia: Secondary | ICD-10-CM

## 2022-05-05 DIAGNOSIS — Z6835 Body mass index (BMI) 35.0-35.9, adult: Secondary | ICD-10-CM

## 2022-05-05 DIAGNOSIS — E669 Obesity, unspecified: Secondary | ICD-10-CM

## 2022-05-05 DIAGNOSIS — E282 Polycystic ovarian syndrome: Secondary | ICD-10-CM

## 2022-05-05 DIAGNOSIS — E88819 Insulin resistance, unspecified: Secondary | ICD-10-CM | POA: Diagnosis not present

## 2022-05-05 MED ORDER — METFORMIN HCL 500 MG PO TABS: 500.0000 mg | ORAL_TABLET | Freq: Every day | ORAL | 0 refills | Status: DC

## 2022-05-11 ENCOUNTER — Encounter: Payer: Self-pay | Admitting: Bariatrics

## 2022-05-11 NOTE — Progress Notes (Signed)
Chief Complaint:   OBESITY Connie Blanchard is here to discuss her progress with her obesity treatment plan along with follow-up of her obesity related diagnoses. Connie Blanchard is on the Category 2 Plan and states she is following her eating plan approximately 50% of the time. Connie Blanchard states she is bike riding and doing yoga for 30-60 minutes 2-3 times per week.  Today's visit was #: 4 Starting weight: 224 lbs Starting date: 03/11/2022 Today's weight: 225 lbs Today's date: 05/05/2022 Total lbs lost to date: 0 Total lbs lost since last in-office visit: 0  Interim History: Connie Blanchard is up 1 lb since her last visit. She has been sick (aches and fatigue), and she was able to eat as well. She is doing [employer], [employer].   Subjective:   1. Insulin resistance Connie Blanchard is not on medications, and she notes increase in appetite.   2. PCOS (polycystic ovarian syndrome) Connie Blanchard is not on medications except for birthcontrol pills.   3. Polyphagia Connie Blanchard notes increase in her appetite.   Assessment/Plan:   1. Insulin resistance Connie Blanchard will adhere closely to the plan 80-90%, and she will continue exercise.   2. PCOS (polycystic ovarian syndrome) Connie Blanchard will minimize all carbohydrates (sugar and starches).    3. Polyphagia Handout on metformin was given to the patient (patient read it). Connie Blanchard agreed to start metformin 500 mg q AM with breakfast, with no refills.   - metFORMIN (GLUCOPHAGE) 500 MG tablet; Take 1 tablet (500 mg total) by mouth daily with breakfast.  Dispense: 30 tablet; Refill: 0  4. Obesity, Current BMI 35.4 Connie Blanchard is currently in the action stage of change. As such, her goal is to continue with weight loss efforts. She has agreed to the Category 2 Plan and keeping a food journal and adhering to recommended goals of 1200 calories and 80+ grams of protein.   She will adhere to the plan 80-90%.   Exercise goals: As is.   Behavioral modification strategies: increasing lean  protein intake, decreasing simple carbohydrates, increasing vegetables, increasing water intake, decreasing eating out, no skipping meals, meal planning and cooking strategies, keeping healthy foods in the home, and planning for success.  Connie Blanchard has agreed to follow-up with our clinic in 2 to 3 weeks. She was informed of the importance of frequent follow-up visits to maximize her success with intensive lifestyle modifications for her multiple health conditions.   Objective:   Blood pressure 108/70, pulse 72, temperature 98 F (36.7 C), height '5\' 7"'$  (1.702 m), weight 225 lb (102.1 kg), last menstrual period 03/03/2017, SpO2 99 %. Body mass index is 35.24 kg/m.  General: Cooperative, alert, well developed, in no acute distress. HEENT: Conjunctivae and lids unremarkable. Cardiovascular: Regular rhythm.  Lungs: Normal work of breathing. Neurologic: No focal deficits.   Lab Results  Component Value Date   CREATININE 0.83 03/11/2022   BUN 12 03/11/2022   NA 138 03/11/2022   K 4.0 03/11/2022   CL 98 03/11/2022   CO2 24 03/11/2022   Lab Results  Component Value Date   ALT 11 03/11/2022   AST 14 03/11/2022   ALKPHOS 63 03/11/2022   BILITOT 0.3 03/11/2022   Lab Results  Component Value Date   HGBA1C 5.3 03/11/2022   HGBA1C 5.4 07/01/2020   HGBA1C 5.5 03/26/2020   HGBA1C 5.3 03/16/2019   HGBA1C 5.3 09/27/2018   Lab Results  Component Value Date   INSULIN 18.0 03/11/2022   INSULIN 10.8 03/26/2020   INSULIN 23.4 03/16/2019   INSULIN 13.9  09/27/2018   INSULIN 16.0 06/07/2018   Lab Results  Component Value Date   TSH 4.47 12/25/2021   Lab Results  Component Value Date   CHOL 183 03/11/2022   HDL 56 03/11/2022   LDLCALC 98 03/11/2022   LDLDIRECT 83.0 12/25/2021   TRIG 167 (H) 03/11/2022   CHOLHDL 4 12/25/2021   Lab Results  Component Value Date   VD25OH 42.8 03/11/2022   VD25OH 35.2 03/26/2020   VD25OH 43.9 03/16/2019   Lab Results  Component Value Date   WBC  5.5 12/25/2021   HGB 13.3 12/25/2021   HCT 38.7 12/25/2021   MCV 93.1 12/25/2021   PLT 216.0 12/25/2021   Lab Results  Component Value Date   IRON 116 05/25/2017   TIBC 397 10/29/2011   Attestation Statements:   Reviewed by clinician on day of visit: allergies, medications, problem list, medical history, surgical history, family history, social history, and previous encounter notes.   Wilhemena Durie, am acting as Location manager for [employer], DO.  I have reviewed the above documentation for accuracy and completeness, and I agree with the above. Jearld Lesch, DO

## 2022-05-18 ENCOUNTER — Ambulatory Visit: Payer: 59 | Admitting: Nurse Practitioner

## 2022-05-27 ENCOUNTER — Other Ambulatory Visit: Payer: Self-pay | Admitting: Bariatrics

## 2022-05-27 DIAGNOSIS — R632 Polyphagia: Secondary | ICD-10-CM

## 2022-06-19 ENCOUNTER — Other Ambulatory Visit: Payer: Self-pay | Admitting: Family Medicine

## 2022-06-23 ENCOUNTER — Ambulatory Visit: Payer: 59 | Admitting: Bariatrics

## 2022-06-23 ENCOUNTER — Encounter: Payer: Self-pay | Admitting: Bariatrics

## 2022-06-23 VITALS — BP 121/81 | HR 57 | Temp 97.8°F | Ht 67.0 in | Wt 228.0 lb

## 2022-06-23 DIAGNOSIS — E88819 Insulin resistance, unspecified: Secondary | ICD-10-CM

## 2022-06-23 DIAGNOSIS — R632 Polyphagia: Secondary | ICD-10-CM | POA: Diagnosis not present

## 2022-06-23 DIAGNOSIS — E669 Obesity, unspecified: Secondary | ICD-10-CM

## 2022-06-23 DIAGNOSIS — Z6835 Body mass index (BMI) 35.0-35.9, adult: Secondary | ICD-10-CM

## 2022-06-23 MED ORDER — METFORMIN HCL 500 MG PO TABS: 500.0000 mg | ORAL_TABLET | Freq: Two times a day (BID) | ORAL | 0 refills | Status: DC

## 2022-06-28 NOTE — Assessment & Plan Note (Signed)
Patient encouraged to maintain heart healthy diet, regular exercise, adequate sleep. Consider daily probiotics. Take medications as prescribed Labs ordered and reviewed  Consider COVID and flu boosters Tetanus is due. Consider Prevar 20 Colonoscopy in 2018, repeat in 2028 [facility] 2023 repeat every one to two years. Pap in 2019 with GYN will request from Dr Philis Pique

## 2022-06-28 NOTE — Assessment & Plan Note (Signed)
Encourage heart healthy diet such as MIND or DASH diet, increase exercise, avoid trans fats, simple carbohydrates and processed foods, consider a krill or fish or flaxseed oil cap daily.  °

## 2022-06-28 NOTE — Assessment & Plan Note (Signed)
Encouraged DASH or MIND diet, decrease po intake and increase exercise as tolerated. Needs 7-8 hours of sleep nightly. Avoid trans fats, eat small, frequent meals every 4-5 hours with lean proteins, complex carbs and healthy fats. Minimize simple carbs, high fat foods and processed foods 

## 2022-06-28 NOTE — Assessment & Plan Note (Signed)
Using Dexilant

## 2022-06-28 NOTE — Assessment & Plan Note (Signed)
Well controlled, no changes to meds. Encouraged heart healthy diet such as the DASH diet and exercise as tolerated.  °

## 2022-06-28 NOTE — Assessment & Plan Note (Signed)
hgba1c acceptable, minimize simple carbs. Increase exercise as tolerated.  

## 2022-06-29 ENCOUNTER — Ambulatory Visit (INDEPENDENT_AMBULATORY_CARE_PROVIDER_SITE_OTHER): Payer: 59 | Admitting: Family Medicine

## 2022-06-29 VITALS — BP 118/76 | HR 66 | Temp 97.7°F | Resp 16 | Ht 67.0 in | Wt 227.0 lb

## 2022-06-29 DIAGNOSIS — R739 Hyperglycemia, unspecified: Secondary | ICD-10-CM

## 2022-06-29 DIAGNOSIS — K219 Gastro-esophageal reflux disease without esophagitis: Secondary | ICD-10-CM

## 2022-06-29 DIAGNOSIS — Z23 Encounter for immunization: Secondary | ICD-10-CM

## 2022-06-29 DIAGNOSIS — Z6835 Body mass index (BMI) 35.0-35.9, adult: Secondary | ICD-10-CM

## 2022-06-29 DIAGNOSIS — E782 Mixed hyperlipidemia: Secondary | ICD-10-CM | POA: Diagnosis not present

## 2022-06-29 DIAGNOSIS — I10 Essential (primary) hypertension: Secondary | ICD-10-CM | POA: Diagnosis not present

## 2022-06-29 DIAGNOSIS — I493 Ventricular premature depolarization: Secondary | ICD-10-CM

## 2022-06-29 DIAGNOSIS — Z0000 Encounter for general adult medical examination without abnormal findings: Secondary | ICD-10-CM

## 2022-06-29 DIAGNOSIS — E66812 Obesity, class 2: Secondary | ICD-10-CM

## 2022-06-29 LAB — COMPREHENSIVE METABOLIC PANEL
ALT: 14 U/L (ref 0–35)
AST: 16 U/L (ref 0–37)
Albumin: 4.2 g/dL (ref 3.5–5.2)
Alkaline Phosphatase: 56 U/L (ref 39–117)
BUN: 14 mg/dL (ref 6–23)
CO2: 30 mEq/L (ref 19–32)
Calcium: 9 mg/dL (ref 8.4–10.5)
Chloride: 100 mEq/L (ref 96–112)
Creatinine, Ser: 0.9 mg/dL (ref 0.40–1.20)
GFR: 72.91 mL/min (ref >60.00)
Glucose, Bld: 100 mg/dL — ABNORMAL HIGH (ref 70–99)
Potassium: 3.9 mEq/L (ref 3.5–5.1)
Sodium: 138 mEq/L (ref 135–145)
Total Bilirubin: 0.6 mg/dL (ref 0.2–1.2)
Total Protein: 7.4 g/dL (ref 6.0–8.3)

## 2022-06-29 LAB — CBC
HCT: 38.7 % (ref 36.0–46.0)
Hemoglobin: 13.6 g/dL (ref 12.0–15.0)
MCHC: 35.2 g/dL (ref 30.0–36.0)
MCV: 89.5 fl (ref 78.0–100.0)
Platelets: 219 10*3/uL (ref 150.0–400.0)
RBC: 4.33 Mil/uL (ref 3.87–5.11)
RDW: 13.5 % (ref 11.5–15.5)
WBC: 4.2 10*3/uL (ref 4.0–10.5)

## 2022-06-29 LAB — LIPID PANEL
Cholesterol: 203 mg/dL — ABNORMAL HIGH (ref 0–200)
HDL: 61.4 mg/dL (ref >39.00)
LDL Cholesterol: 116 mg/dL — ABNORMAL HIGH (ref 0–99)
NonHDL: 141.23
Total CHOL/HDL Ratio: 3
Triglycerides: 125 mg/dL (ref 0.0–149.0)
VLDL: 25 mg/dL (ref 0.0–40.0)

## 2022-06-29 LAB — TSH: TSH: 3.13 u[IU]/mL (ref 0.35–5.50)

## 2022-06-29 LAB — HEMOGLOBIN A1C: Hgb A1c MFr Bld: 5.5 % (ref 4.6–6.5)

## 2022-06-29 NOTE — Assessment & Plan Note (Signed)
Has been following with Dr Harriet Masson, cardiology, she has had Coronary Artery CT and Echo and over all is feeling well now. Will follow up with cards if symptoms worsen.

## 2022-06-29 NOTE — Progress Notes (Signed)
Subjective:    Patient ID: Connie Blanchard, female    DOB: [birth year], 51 y.o.   MRN: [id number]  Chief Complaint  Patient presents with   Annual Exam    Annual Exam     HPI Patient is in today for annual preventative exam and follow up on chronic medical concerns. She is doing well today. She has had some trouble with PVCs.and has been evaluated by cardiology and she feels good at this time. She has continued to work long hours, caring for her family so she is not exercising regularly. She is trying to eat better and get rest. Denies CP/palp/SOB/HA/congestion/fevers/GI or GU c/o. Taking meds as prescribed   Past Medical History:  Diagnosis Date   Acute bronchitis 06/24/2012   Acute bronchitis with asthma 06/24/2012   Allergic state 10/15/2013   Anemia 08/25/2011   Anxiety    Anxiety    not current   At risk for heart disease 04/01/2020   Back pain 10/15/2013   Back pain    Cervical disc disease 04/22/2015   Chronic sinusitis 02/2012   current runny nose of clear drainage   Colon cancer screening 07/29/2011   Sees Dr Donne Hazel of dermatology in [facility] Dr Philis Pique of GYN for paps and [facility] and denies any concerns   Constipation    Dental crowns present    Depression    Depression with anxiety    in the past    Essential hypertension 10/06/2015   Gallbladder polyp 08/06/2020   GERD (gastroesophageal reflux disease)    H/O gestational diabetes mellitus, not currently pregnant 07/29/2011   h/o    Headache    history of migraines   History of blood transfusion    History of blood transfusion    History of gestational diabetes mellitus (GDM) 03/26/2020   History of hiatal hernia    Hyperglycemia 05/25/2017   Hyperlipidemia, mixed 10/06/2015   Hypertension    Insomnia    Insulin resistance 06/29/2018   Iron deficiency anemia    Joint pain    Laryngopharyngeal reflux (LPR) 02/2012   current runny nose of clear drainage   Left lower quadrant pain 08/06/2020    Left shoulder pain 05/26/2017   Leg edema    Nausea 10/15/2013   Obese    Obesity    Other fatigue 10/12/2017   Palpitation    PCOS (polycystic ovarian syndrome)    Pedal edema 81/15/7262   Periumbilical pain 03/55/9741   Pharyngitis 05/07/2019   Postoperative state 03/17/2017   Right shoulder pain 05/25/2017   Right upper quadrant pain 08/06/2020   Shortness of breath on exertion 10/12/2017   Vitamin D deficiency 03/26/2020    Past Surgical History:  Procedure Laterality Date   ABDOMINAL HYSTERECTOMY  02/2017   anemia, fibroids   CHOLECYSTECTOMY N/A 05/01/2021   Procedure: LAPAROSCOPIC CHOLECYSTECTOMY;  Surgeon: Coralie Keens, MD;  Location: [place];  Service: General;  Laterality: N/A;   COLONOSCOPY     DILATION AND CURETTAGE OF UTERUS  1987   NASAL SEPTOPLASTY W/ TURBINOPLASTY  03/23/2012   Procedure: NASAL SEPTOPLASTY WITH TURBINATE REDUCTION;  Surgeon: Jerrell Belfast, MD;  Location: [place];  Service: ENT;  Laterality: Bilateral;   ROBOTIC ASSISTED TOTAL HYSTERECTOMY WITH SALPINGECTOMY N/A 03/17/2017   Procedure: ROBOTIC ASSISTED TOTAL HYSTERECTOMY WITH SALPINGECTOMY;  Surgeon: Bobbye Charleston, MD;  Location: [facility];  Service: Gynecology;  Laterality: N/A;   SINUS ENDO W/FUSION  03/23/2012   Procedure: ENDOSCOPIC SINUS SURGERY WITH FUSION  NAVIGATION;  Surgeon: Jerrell Belfast, MD;  Location: [place];  Service: ENT;  Laterality: Bilateral;  with fusion scan   UPPER GI ENDOSCOPY     WISDOM TOOTH EXTRACTION      Family History  Problem Relation Age of Onset   Diabetes Mother        type 2   Hypertension Mother    Depression Mother    Mental illness Mother        bi-polar   Pulmonary embolism Mother    Obesity Mother    Sudden death Mother    Other Father        drug addiction   Heart disease Maternal Grandfather    Stroke Maternal Grandfather    Depression Maternal Aunt 83       suicide attempt with pneumonia    Social  History   Socioeconomic History   Marital status: Married    Spouse name: Dellis Filbert   Number of children: 4   Years of education: Not on file   Highest education level: Not on file  Occupational History   Occupation: RN  Tobacco Use   Smoking status: Former    Packs/day: 1.00    Years: 19.00    Total pack years: 19.00    Types: Cigarettes    Quit date: 07/28/2003    Years since quitting: 18.9   Smokeless tobacco: Never  Substance and Sexual Activity   Alcohol use: Not Currently    Comment: occasionally   Drug use: Yes    Comment: CBD oil - last week 04/29/21   Sexual activity: Yes    Partners: Male    Birth control/protection: None    Comment: lives with husband, stepson 3, son and daughter. no dietary restrictions. eating heart healthy, walking more  Other Topics Concern   Not on file  Social History Narrative   Not on file   Social Determinants of Health   Financial Resource Strain: Not on file  Food Insecurity: Not on file  Transportation Needs: Not on file  Physical Activity: Not on file  Stress: Not on file  Social Connections: Not on file  Intimate Partner Violence: Not on file    Outpatient Medications Prior to Visit  Medication Sig Dispense Refill   dexlansoprazole (DEXILANT) 60 MG capsule TAKE 1 CAPSULE 20 MINUTES BEFORE BREAKFAST 90 DAYS 90 capsule 4   estradiol (ESTRACE) 0.5 MG tablet Take 0.5 mg by mouth daily.     KLOR-CON M20 20 MEQ tablet TAKE 1 TABLET BY MOUTH TWICE A DAY 180 tablet 1   metFORMIN (GLUCOPHAGE) 500 MG tablet Take 1 tablet (500 mg total) by mouth 2 (two) times daily with a meal. 60 tablet 0   ondansetron (ZOFRAN-ODT) 8 MG disintegrating tablet Take 8 mg by mouth 2 (two) times daily as needed.     propranolol (INDERAL) 20 MG tablet Take 1 tablet (20 mg total) by mouth 3 (three) times daily. 270 tablet 3   SUMAtriptan (IMITREX) 50 MG tablet Take 1 tablet (50 mg total) by mouth every 2 (two) hours as needed for migraine or headache. May  repeat in 2 hours if headache persists or recurs. 10 tablet 2   triamterene-hydrochlorothiazide (MAXZIDE-25) 37.5-25 MG tablet Take 1 tablet by mouth daily. 90 tablet 1   No facility-administered medications prior to visit.    Allergies  Allergen Reactions   Sulfa Antibiotics Rash   Penicillins Rash    Has patient had a PCN reaction causing immediate rash, facial/tongue/throat  swelling, SOB or lightheadedness with hypotension: Unknown Has patient had a PCN reaction causing severe rash involving mucus membranes or skin necrosis: Unknown Has patient had a PCN reaction that required hospitalization: No Has patient had a PCN reaction occurring within the last 10 years: No Childhood allergy  If all of the above answers are "NO", then may proceed with Cephalosporin use.     Review of Systems  Constitutional:  Positive for malaise/fatigue. Negative for chills and fever.  HENT:  Negative for congestion and hearing loss.   Eyes:  Negative for discharge.  Respiratory:  Negative for cough, sputum production and shortness of breath.   Cardiovascular:  Positive for palpitations. Negative for chest pain and leg swelling.  Gastrointestinal:  Negative for abdominal pain, blood in stool, constipation, diarrhea, heartburn, nausea and vomiting.  Genitourinary:  Negative for dysuria, frequency, hematuria and urgency.  Musculoskeletal:  Negative for back pain, falls and myalgias.  Skin:  Negative for rash.  Neurological:  Negative for dizziness, sensory change, loss of consciousness, weakness and headaches.  Endo/Heme/Allergies:  Negative for environmental allergies. Does not bruise/bleed easily.  Psychiatric/Behavioral:  Negative for depression and suicidal ideas. The patient is not nervous/anxious and does not have insomnia.        Objective:    Physical Exam Constitutional:      General: She is not in acute distress.    Appearance: She is well-developed.  HENT:     Head: Normocephalic and  atraumatic.     Right Ear: Tympanic membrane and external ear normal. There is no impacted cerumen.     Left Ear: Tympanic membrane and external ear normal. There is no impacted cerumen.     Nose: Nose normal. No congestion.     Mouth/Throat:     Mouth: Mucous membranes are moist.  Eyes:     General:        Right eye: No discharge.        Left eye: No discharge.     Extraocular Movements: Extraocular movements intact.     Conjunctiva/sclera: Conjunctivae normal.     Pupils: Pupils are equal, round, and reactive to light.  Neck:     Thyroid: No thyromegaly.  Cardiovascular:     Rate and Rhythm: Normal rate and regular rhythm.     Heart sounds: Normal heart sounds. No murmur heard. Pulmonary:     Effort: Pulmonary effort is normal. No respiratory distress.     Breath sounds: Normal breath sounds.  Abdominal:     General: Bowel sounds are normal. There is no distension.     Palpations: Abdomen is soft. There is no mass.     Tenderness: There is no abdominal tenderness.  Musculoskeletal:     Cervical back: Neck supple.     Right lower leg: No edema.     Left lower leg: No edema.  Lymphadenopathy:     Cervical: No cervical adenopathy.  Skin:    General: Skin is warm and dry.     Capillary Refill: Capillary refill takes less than 2 seconds.     Findings: No rash.  Neurological:     General: No focal deficit present.     Mental Status: She is alert and oriented to person, place, and time.  Psychiatric:        Behavior: Behavior normal.    BP 118/76 (BP Location: Right Arm, Patient Position: Sitting, Cuff Size: Normal)   Pulse 66   Temp 97.7 F (36.5 C) (Oral)   Resp  16   Ht _0  (1.702 m)   Wt 227 lb (103 kg)   LMP 03/03/2017 (Exact Date)   SpO2 96%   BMI 35.55 kg/m  Wt Readings from Last 3 Encounters:  06/29/22 227 lb (103 kg)  06/23/22 228 lb (103.4 kg)  05/05/22 225 lb (102.1 kg)    Diabetic Foot Exam - Simple   No data filed    Lab Results  Component  Value Date   WBC 5.5 12/25/2021   HGB 13.3 12/25/2021   HCT 38.7 12/25/2021   PLT 216.0 12/25/2021   GLUCOSE 83 03/11/2022   CHOL 183 03/11/2022   TRIG 167 (H) 03/11/2022   HDL 56 03/11/2022   LDLDIRECT 83.0 12/25/2021   LDLCALC 98 03/11/2022   ALT 11 03/11/2022   AST 14 03/11/2022   NA 138 03/11/2022   K 4.0 03/11/2022   CL 98 03/11/2022   CREATININE 0.83 03/11/2022   BUN 12 03/11/2022   CO2 24 03/11/2022   TSH 4.47 12/25/2021   HGBA1C 5.3 03/11/2022    Lab Results  Component Value Date   TSH 4.47 12/25/2021   Lab Results  Component Value Date   WBC 5.5 12/25/2021   HGB 13.3 12/25/2021   HCT 38.7 12/25/2021   MCV 93.1 12/25/2021   PLT 216.0 12/25/2021   Lab Results  Component Value Date   NA 138 03/11/2022   K 4.0 03/11/2022   CO2 24 03/11/2022   GLUCOSE 83 03/11/2022   BUN 12 03/11/2022   CREATININE 0.83 03/11/2022   BILITOT 0.3 03/11/2022   ALKPHOS 63 03/11/2022   AST 14 03/11/2022   ALT 11 03/11/2022   PROT 7.5 03/11/2022   ALBUMIN 4.2 03/11/2022   CALCIUM 9.2 03/11/2022   ANIONGAP 10 05/01/2021   EGFR 84 03/11/2022   GFR 58.80 (L) 12/25/2021   Lab Results  Component Value Date   CHOL 183 03/11/2022   Lab Results  Component Value Date   HDL 56 03/11/2022   Lab Results  Component Value Date   LDLCALC 98 03/11/2022   Lab Results  Component Value Date   TRIG 167 (H) 03/11/2022   Lab Results  Component Value Date   CHOLHDL 4 12/25/2021   Lab Results  Component Value Date   HGBA1C 5.3 03/11/2022       Assessment & Plan:   Problem List Items Addressed This Visit     Essential hypertension    Well controlled, no changes to meds. Encouraged heart healthy diet such as the DASH diet and exercise as tolerated.        Relevant Orders   CBC   Comprehensive metabolic panel   TSH   Hyperlipidemia, mixed    Encourage heart healthy diet such as MIND or DASH diet, increase exercise, avoid trans fats, simple carbohydrates and processed  foods, consider a krill or fish or flaxseed oil cap daily.        Relevant Orders   Lipid panel   Hyperglycemia    hgba1c acceptable, minimize simple carbs. Increase exercise as tolerated.      Relevant Orders   Hemoglobin A1c   Insulin, random   Laryngopharyngeal reflux (LPR)    Using Dexilant      Preventative health care    Patient encouraged to maintain heart healthy diet, regular exercise, adequate sleep. Consider daily probiotics. Take medications as prescribed Labs ordered and reviewed  Consider COVID and flu boosters Tetanus is due. Consider Prevar 20 Colonoscopy in 2018, repeat in  2028 [facility] 2023 repeat every one to two years. Pap in 2019 with GYN will request from Dr Philis Pique      Class 2 severe obesity with serious comorbidity and body mass index (BMI) of 35.0 to 35.9 in adult [facility]) - Primary    Encouraged DASH or MIND diet, decrease po intake and increase exercise as tolerated. Needs 7-8 hours of sleep nightly. Avoid trans fats, eat small, frequent meals every 4-5 hours with lean proteins, complex carbs and healthy fats. Minimize simple carbs, high fat foods and processed foods       PVC (premature ventricular contraction)    Has been following with Dr Harriet Masson, cardiology, she has had Coronary Artery CT and Echo and over all is feeling well now. Will follow up with cards if symptoms worsen.        I am having Lucas Mallow. Berline Lopes "Kim" maintain her estradiol, SUMAtriptan, Klor-Con M20, propranolol, ondansetron, triamterene-hydrochlorothiazide, dexlansoprazole, and metFORMIN.  No orders of the defined types were placed in this encounter.    Penni Homans, MD

## 2022-06-29 NOTE — Patient Instructions (Signed)
Preventive Care 40-53 Years Old, Female Preventive care refers to lifestyle choices and visits with your health care provider that can promote health and wellness. Preventive care visits are also called wellness exams. What can I expect for my preventive care visit? Counseling Your health care provider may ask you questions about your: Medical history, including: Past medical problems. Family medical history. Pregnancy history. Current health, including: Menstrual cycle. Method of birth control. Emotional well-being. Home life and relationship well-being. Sexual activity and sexual health. Lifestyle, including: Alcohol, nicotine or tobacco, and drug use. Access to firearms. Diet, exercise, and sleep habits. Work and work environment. Sunscreen use. Safety issues such as seatbelt and bike helmet use. Physical exam Your health care provider will check your: Height and weight. These may be used to calculate your BMI (body mass index). BMI is a measurement that tells if you are at a healthy weight. Waist circumference. This measures the distance around your waistline. This measurement also tells if you are at a healthy weight and may help predict your risk of certain diseases, such as type 2 diabetes and high blood pressure. Heart rate and blood pressure. Body temperature. Skin for abnormal spots. What immunizations do I need?  Vaccines are usually given at various ages, according to a schedule. Your health care provider will recommend vaccines for you based on your age, medical history, and lifestyle or other factors, such as travel or where you work. What tests do I need? Screening Your health care provider may recommend screening tests for certain conditions. This may include: Lipid and cholesterol levels. Diabetes screening. This is done by checking your blood sugar (glucose) after you have not eaten for a while (fasting). Pelvic exam and Pap test. Hepatitis B test. Hepatitis C  test. HIV (human immunodeficiency virus) test. STI (sexually transmitted infection) testing, if you are at risk. Lung cancer screening. Colorectal cancer screening. Mammogram. Talk with your health care provider about when you should start having regular mammograms. This may depend on whether you have a family history of breast cancer. BRCA-related cancer screening. This may be done if you have a family history of breast, ovarian, tubal, or peritoneal cancers. Bone density scan. This is done to screen for osteoporosis. Talk with your health care provider about your test results, treatment options, and if necessary, the need for more tests. Follow these instructions at home: Eating and drinking  Eat a diet that includes fresh fruits and vegetables, whole grains, lean protein, and low-fat dairy products. Take vitamin and mineral supplements as recommended by your health care provider. Do not drink alcohol if: Your health care provider tells you not to drink. You are pregnant, may be pregnant, or are planning to become pregnant. If you drink alcohol: Limit how much you have to 0-1 drink a day. Know how much alcohol is in your drink. In the [place] one drink equals one 12 oz bottle of beer (355 mL), one 5 oz glass of wine (148 mL), or one 1 oz glass of hard liquor (44 mL). Lifestyle Brush your teeth every morning and night with fluoride toothpaste. Floss one time each day. Exercise for at least 30 minutes 5 or more days each week. Do not use any products that contain nicotine or tobacco. These products include cigarettes, chewing tobacco, and vaping devices, such as e-cigarettes. If you need help quitting, ask your health care provider. Do not use drugs. If you are sexually active, practice safe sex. Use a condom or other form of protection to   prevent STIs. If you do not wish to become pregnant, use a form of birth control. If you plan to become pregnant, see your health care provider for a  prepregnancy visit. Take aspirin only as told by your health care provider. Make sure that you understand how much to take and what form to take. Work with your health care provider to find out whether it is safe and beneficial for you to take aspirin daily. Find healthy ways to manage stress, such as: Meditation, yoga, or listening to music. Journaling. Talking to a trusted person. Spending time with friends and family. Minimize exposure to UV radiation to reduce your risk of skin cancer. Safety Always wear your seat belt while driving or riding in a vehicle. Do not drive: If you have been drinking alcohol. Do not ride with someone who has been drinking. When you are tired or distracted. While texting. If you have been using any mind-altering substances or drugs. Wear a helmet and other protective equipment during sports activities. If you have firearms in your house, make sure you follow all gun safety procedures. Seek help if you have been physically or sexually abused. What's next? Visit your health care provider once a year for an annual wellness visit. Ask your health care provider how often you should have your eyes and teeth checked. Stay up to date on all vaccines. This information is not intended to replace advice given to you by your health care provider. Make sure you discuss any questions you have with your health care provider. Document Revised: 01/08/2021 Document Reviewed: 01/08/2021 Elsevier Patient Education  [place].

## 2022-06-30 LAB — INSULIN, RANDOM: Insulin: 15.2 u[IU]/mL

## 2022-06-30 NOTE — Progress Notes (Unsigned)
Chief Complaint:   OBESITY Connie Blanchard is here to discuss her progress with her obesity treatment plan along with follow-up of her obesity related diagnoses. Connie Blanchard is on the Category 2 Plan and keeping a food journal and adhering to recommended goals of 1200 calories and 80+ grams of protein and states she is following her eating plan approximately 10-20% of the time. Connie Blanchard states she is doing 0 minutes 0 times per week.  Today's visit was #: 5 Starting weight: 224 lbs Starting date: 03/11/2022 Today's weight: 228 lbs Today's date: 06/23/2022 Total lbs lost to date: 0 Total lbs lost since last in-office visit: 0  Interim History: Connie Blanchard could not stay on the plan for Thanksgiving.   Subjective:   1. Polyphagia Manilla notes increased appetite in the evening.   2. Insulin resistance Connie Blanchard is taking metformin.   Assessment/Plan:   1. Polyphagia Connie Blanchard will continue metformin 500 mg BID, and we will refill for 1 month.   - metFORMIN (GLUCOPHAGE) 500 MG tablet; Take 1 tablet (500 mg total) by mouth 2 (two) times daily with a meal.  Dispense: 60 tablet; Refill: 0  2. Insulin resistance Connie Blanchard will continue metformin, and she will keep carbohydrates low (sweets and starches).   3. Obesity, Current BMI 35.7 Connie Blanchard is currently in the action stage of change. As such, her goal is to continue with weight loss efforts. She has agreed to the Category 2 Plan and keeping a food journal and adhering to recommended goals of 1200 calories and 80+ grams of protein.   Keep water, protein, and fiber intake high.   Exercise goals: Walking and stationary bike.   Behavioral modification strategies: increasing lean protein intake, decreasing simple carbohydrates, increasing vegetables, increasing water intake, decreasing eating out, no skipping meals, meal planning and cooking strategies, keeping healthy foods in the home, and planning for success.  Connie Blanchard has agreed to  follow-up with our clinic in 2 to 3 weeks. She was informed of the importance of frequent follow-up visits to maximize her success with intensive lifestyle modifications for her multiple health conditions.   Objective:   Blood pressure 121/81, pulse (!) 57, temperature 97.8 F (36.6 C), height '5\' 7"'$  (1.702 m), weight 228 lb (103.4 kg), last menstrual period 03/03/2017, SpO2 99 %. Body mass index is 35.71 kg/m.  General: Cooperative, alert, well developed, in no acute distress. HEENT: Conjunctivae and lids unremarkable. Cardiovascular: Regular rhythm.  Lungs: Normal work of breathing. Neurologic: No focal deficits.   Lab Results  Component Value Date   CREATININE 0.90 06/29/2022   BUN 14 06/29/2022   NA 138 06/29/2022   K 3.9 06/29/2022   CL 100 06/29/2022   CO2 30 06/29/2022   Lab Results  Component Value Date   ALT 14 06/29/2022   AST 16 06/29/2022   ALKPHOS 56 06/29/2022   BILITOT 0.6 06/29/2022   Lab Results  Component Value Date   HGBA1C 5.5 06/29/2022   HGBA1C 5.3 03/11/2022   HGBA1C 5.4 07/01/2020   HGBA1C 5.5 03/26/2020   HGBA1C 5.3 03/16/2019   Lab Results  Component Value Date   INSULIN 18.0 03/11/2022   INSULIN 10.8 03/26/2020   INSULIN 23.4 03/16/2019   INSULIN 13.9 09/27/2018   INSULIN 16.0 06/07/2018   Lab Results  Component Value Date   TSH 3.13 06/29/2022   Lab Results  Component Value Date   CHOL 203 (H) 06/29/2022   HDL 61.40 06/29/2022   LDLCALC 116 (H) 06/29/2022   LDLDIRECT 83.0  12/25/2021   TRIG 125.0 06/29/2022   CHOLHDL 3 06/29/2022   Lab Results  Component Value Date   VD25OH 42.8 03/11/2022   VD25OH 35.2 03/26/2020   VD25OH 43.9 03/16/2019   Lab Results  Component Value Date   WBC 4.2 06/29/2022   HGB 13.6 06/29/2022   HCT 38.7 06/29/2022   MCV 89.5 06/29/2022   PLT 219.0 06/29/2022   Lab Results  Component Value Date   IRON 116 05/25/2017   TIBC 397 10/29/2011   Attestation Statements:   Reviewed by clinician  on day of visit: allergies, medications, problem list, medical history, surgical history, family history, social history, and previous encounter notes.   Wilhemena Durie, am acting as Location manager for [employer], DO.  I have reviewed the above documentation for accuracy and completeness, and I agree with the above. Jearld Lesch, DO

## 2022-07-01 ENCOUNTER — Encounter: Payer: Self-pay | Admitting: Bariatrics

## 2022-07-08 ENCOUNTER — Encounter: Payer: Self-pay | Admitting: Bariatrics

## 2022-07-08 ENCOUNTER — Ambulatory Visit: Payer: 59 | Admitting: Bariatrics

## 2022-07-08 VITALS — BP 110/74 | HR 61 | Temp 98.1°F | Ht 67.0 in | Wt 225.0 lb

## 2022-07-08 DIAGNOSIS — R632 Polyphagia: Secondary | ICD-10-CM

## 2022-07-08 DIAGNOSIS — E669 Obesity, unspecified: Secondary | ICD-10-CM

## 2022-07-08 DIAGNOSIS — I10 Essential (primary) hypertension: Secondary | ICD-10-CM | POA: Diagnosis not present

## 2022-07-08 DIAGNOSIS — Z6835 Body mass index (BMI) 35.0-35.9, adult: Secondary | ICD-10-CM

## 2022-07-08 MED ORDER — METFORMIN HCL 500 MG PO TABS: 500.0000 mg | ORAL_TABLET | Freq: Two times a day (BID) | ORAL | 0 refills | Status: DC

## 2022-07-21 ENCOUNTER — Encounter: Payer: Self-pay | Admitting: Bariatrics

## 2022-07-21 NOTE — Progress Notes (Signed)
Chief Complaint:   OBESITY Connie Blanchard is here to discuss her progress with her obesity treatment plan along with follow-up of her obesity related diagnoses. Connie Blanchard is on the Category 2 Plan and keeping a food journal and adhering to recommended goals of 1200 calories and 80+ grams of protein and states she is following her eating plan approximately 60% of the time. Connie Blanchard states she is biking and walking for 30 minutes 1-2 times per week.  Today's visit was #: 6 Starting weight: 224 lbs Starting date: 03/11/2022 Today's weight: 225 lbs Today's date: 07/08/2022 Total lbs lost to date: 0 Total lbs lost since last in-office visit: 3  Interim History: Connie Blanchard is down 3 pounds since her last visit.  She is taking the metformin.  Subjective:   1. Polyphagia Connie Blanchard notes decreased hunger and control snacking on metformin.  2. Essential hypertension Connie Blanchard's blood pressure is controlled at 110/74.  Assessment/Plan:   1. Polyphagia Connie Blanchard will continue metformin 500 mg twice daily, and we will refill for 1 month.  - metFORMIN (GLUCOPHAGE) 500 MG tablet; Take 1 tablet (500 mg total) by mouth 2 (two) times daily with a meal.  Dispense: 60 tablet; Refill: 0  2. Essential hypertension Connie Blanchard will continue working on eliminating added salt, and she will keep her exercise on a regimen.  3. Obesity, Current BMI 35.3 Connie Blanchard is currently in the action stage of change. As such, her goal is to continue with weight loss efforts. She has agreed to the Category 2 Plan.   Meal planning and intentional eating were discussed.   Exercise goals: As is.   Behavioral modification strategies: increasing lean protein intake, decreasing simple carbohydrates, increasing vegetables, increasing water intake, decreasing eating out, no skipping meals, meal planning and cooking strategies, keeping healthy foods in the home, and planning for success.  Connie Blanchard has agreed to follow-up with our  clinic in 4 weeks. She was informed of the importance of frequent follow-up visits to maximize her success with intensive lifestyle modifications for her multiple health conditions.   Objective:   Blood pressure 110/74, pulse 61, temperature 98.1 F (36.7 C), height '5\' 7"'$  (1.702 m), weight 225 lb (102.1 kg), last menstrual period 03/03/2017, SpO2 99 %. Body mass index is 35.24 kg/m.  General: Cooperative, alert, well developed, in no acute distress. HEENT: Conjunctivae and lids unremarkable. Cardiovascular: Regular rhythm.  Lungs: Normal work of breathing. Neurologic: No focal deficits.   Lab Results  Component Value Date   CREATININE 0.90 06/29/2022   BUN 14 06/29/2022   NA 138 06/29/2022   K 3.9 06/29/2022   CL 100 06/29/2022   CO2 30 06/29/2022   Lab Results  Component Value Date   ALT 14 06/29/2022   AST 16 06/29/2022   ALKPHOS 56 06/29/2022   BILITOT 0.6 06/29/2022   Lab Results  Component Value Date   HGBA1C 5.5 06/29/2022   HGBA1C 5.3 03/11/2022   HGBA1C 5.4 07/01/2020   HGBA1C 5.5 03/26/2020   HGBA1C 5.3 03/16/2019   Lab Results  Component Value Date   INSULIN 18.0 03/11/2022   INSULIN 10.8 03/26/2020   INSULIN 23.4 03/16/2019   INSULIN 13.9 09/27/2018   INSULIN 16.0 06/07/2018   Lab Results  Component Value Date   TSH 3.13 06/29/2022   Lab Results  Component Value Date   CHOL 203 (H) 06/29/2022   HDL 61.40 06/29/2022   LDLCALC 116 (H) 06/29/2022   LDLDIRECT 83.0 12/25/2021   TRIG 125.0 06/29/2022   CHOLHDL  3 06/29/2022   Lab Results  Component Value Date   VD25OH 42.8 03/11/2022   VD25OH 35.2 03/26/2020   VD25OH 43.9 03/16/2019   Lab Results  Component Value Date   WBC 4.2 06/29/2022   HGB 13.6 06/29/2022   HCT 38.7 06/29/2022   MCV 89.5 06/29/2022   PLT 219.0 06/29/2022   Lab Results  Component Value Date   IRON 116 05/25/2017   TIBC 397 10/29/2011   Attestation Statements:   Reviewed by clinician on day of visit: allergies,  medications, problem list, medical history, surgical history, family history, social history, and previous encounter notes.   Wilhemena Durie, am acting as Location manager for [employer], DO.  I have reviewed the above documentation for accuracy and completeness, and I agree with the above. Jearld Lesch, DO

## 2022-07-29 ENCOUNTER — Ambulatory Visit: Payer: 59 | Admitting: Bariatrics

## 2022-07-29 ENCOUNTER — Encounter: Payer: Self-pay | Admitting: Bariatrics

## 2022-07-29 VITALS — BP 107/72 | HR 65 | Temp 98.2°F | Ht 67.0 in | Wt 230.0 lb

## 2022-07-29 DIAGNOSIS — R632 Polyphagia: Secondary | ICD-10-CM | POA: Diagnosis not present

## 2022-07-29 DIAGNOSIS — Z6836 Body mass index (BMI) 36.0-36.9, adult: Secondary | ICD-10-CM | POA: Diagnosis not present

## 2022-07-29 DIAGNOSIS — E669 Obesity, unspecified: Secondary | ICD-10-CM

## 2022-07-29 DIAGNOSIS — I10 Essential (primary) hypertension: Secondary | ICD-10-CM

## 2022-07-29 MED ORDER — METFORMIN HCL 500 MG PO TABS: 500.0000 mg | ORAL_TABLET | Freq: Two times a day (BID) | ORAL | 0 refills | Status: DC

## 2022-08-05 NOTE — Telephone Encounter (Signed)
Pt called 1/10 need refill on Metformin sent to her pharmacy [facility]

## 2022-08-06 ENCOUNTER — Other Ambulatory Visit: Payer: Self-pay | Admitting: Bariatrics

## 2022-08-06 ENCOUNTER — Other Ambulatory Visit (INDEPENDENT_AMBULATORY_CARE_PROVIDER_SITE_OTHER): Payer: Self-pay | Admitting: Bariatrics

## 2022-08-06 ENCOUNTER — Encounter: Payer: Self-pay | Admitting: Bariatrics

## 2022-08-06 DIAGNOSIS — R632 Polyphagia: Secondary | ICD-10-CM

## 2022-08-06 MED ORDER — METFORMIN HCL 500 MG PO TABS: 500.0000 mg | ORAL_TABLET | Freq: Two times a day (BID) | ORAL | 0 refills | Status: DC

## 2022-08-12 NOTE — Progress Notes (Unsigned)
Chief Complaint:   OBESITY Connie Blanchard is here to discuss her progress with her obesity treatment plan along with follow-up of her obesity related diagnoses. Connie Blanchard is on the Category 2 Plan and states she is following her eating plan approximately 25% of the time. Connie Blanchard states she is walking for 30 minutes 1-2 times per week.  Today's visit was #: 7 Starting weight: 224 lbs Starting date: 03/11/2022 Today's weight: 230 lbs Today's date: 07/29/2022 Total lbs lost to date: 0 Total lbs lost since last in-office visit: 0  Interim History: Connie Blanchard is up 5 lbs since her last visit, which was over the holidays. She went to the beach, but she got off her routine. She is retaining 3 lbs of water weight.   Subjective:   1. Essential hypertension Jacklyne is taking propranolol and Maxzide.  2. Polyphagia [name] is taking metformin.   Assessment/Plan:   1. Essential hypertension Connie Blanchard will continue her medications as directed.   2. Connie Blanchard will continue metformin, increase protein, healthy fats, and water. We will refill metformin 500 mg BID #60 for 1 month.   3. Obesity, Current BMI 36.1 Shatiqua is currently in the action stage of change. As such, her goal is to continue with weight loss efforts. She has agreed to the Category 2 Plan.   She will adhere to the plan 80-90%. Mindful eating was discussed. Increase protein and water.   Exercise goals: As is.   Behavioral modification strategies: increasing lean protein intake, decreasing simple carbohydrates, increasing vegetables, increasing water intake, decreasing eating out, no skipping meals, meal planning and cooking strategies, keeping healthy foods in the home, and planning for success.  Connie Blanchard has agreed to follow-up with our clinic in 2 to 3 weeks with Everardo Pacific, FNP-C. She was informed of the importance of frequent follow-up visits to maximize her success with intensive lifestyle  modifications for her multiple health conditions.   Objective:   Blood pressure 107/72, pulse 65, temperature 98.2 F (36.8 C), height '5\' 7"'$  (1.702 m), weight 230 lb (104.3 kg), last menstrual period 03/03/2017, SpO2 98 %. Body mass index is 36.02 kg/m.  General: Cooperative, alert, well developed, in no acute distress. HEENT: Conjunctivae and lids unremarkable. Cardiovascular: Regular rhythm.  Lungs: Normal work of breathing. Neurologic: No focal deficits.   Lab Results  Component Value Date   CREATININE 0.90 06/29/2022   BUN 14 06/29/2022   NA 138 06/29/2022   K 3.9 06/29/2022   CL 100 06/29/2022   CO2 30 06/29/2022   Lab Results  Component Value Date   ALT 14 06/29/2022   AST 16 06/29/2022   ALKPHOS 56 06/29/2022   BILITOT 0.6 06/29/2022   Lab Results  Component Value Date   HGBA1C 5.5 06/29/2022   HGBA1C 5.3 03/11/2022   HGBA1C 5.4 07/01/2020   HGBA1C 5.5 03/26/2020   HGBA1C 5.3 03/16/2019   Lab Results  Component Value Date   INSULIN 18.0 03/11/2022   INSULIN 10.8 03/26/2020   INSULIN 23.4 03/16/2019   INSULIN 13.9 09/27/2018   INSULIN 16.0 06/07/2018   Lab Results  Component Value Date   TSH 3.13 06/29/2022   Lab Results  Component Value Date   CHOL 203 (H) 06/29/2022   HDL 61.40 06/29/2022   LDLCALC 116 (H) 06/29/2022   LDLDIRECT 83.0 12/25/2021   TRIG 125.0 06/29/2022   CHOLHDL 3 06/29/2022   Lab Results  Component Value Date   VD25OH 42.8 03/11/2022   VD25OH 35.2 03/26/2020  VD25OH 43.9 03/16/2019   Lab Results  Component Value Date   WBC 4.2 06/29/2022   HGB 13.6 06/29/2022   HCT 38.7 06/29/2022   MCV 89.5 06/29/2022   PLT 219.0 06/29/2022   Lab Results  Component Value Date   IRON 116 05/25/2017   TIBC 397 10/29/2011   Attestation Statements:   Reviewed by clinician on day of visit: allergies, medications, problem list, medical history, surgical history, family history, social history, and previous encounter notes.   Wilhemena Durie, am acting as Location manager for [employer], DO.  I have reviewed the above documentation for accuracy and completeness, and I agree with the above. Jearld Lesch, DO

## 2022-08-19 ENCOUNTER — Encounter: Payer: Self-pay | Admitting: Bariatrics

## 2022-08-19 ENCOUNTER — Ambulatory Visit: Payer: 59 | Admitting: Nurse Practitioner

## 2022-08-26 ENCOUNTER — Ambulatory Visit: Payer: 59 | Admitting: Nurse Practitioner

## 2022-08-26 VITALS — BP 124/74 | HR 59 | Temp 97.9°F | Ht 67.0 in | Wt 233.0 lb

## 2022-08-26 DIAGNOSIS — Z6836 Body mass index (BMI) 36.0-36.9, adult: Secondary | ICD-10-CM

## 2022-08-26 DIAGNOSIS — R5383 Other fatigue: Secondary | ICD-10-CM | POA: Diagnosis not present

## 2022-08-26 DIAGNOSIS — K219 Gastro-esophageal reflux disease without esophagitis: Secondary | ICD-10-CM

## 2022-08-26 DIAGNOSIS — E669 Obesity, unspecified: Secondary | ICD-10-CM | POA: Insufficient documentation

## 2022-08-26 NOTE — Patient Instructions (Signed)

## 2022-09-03 NOTE — Progress Notes (Signed)
Chief Complaint:   OBESITY Connie Blanchard is here to discuss her progress with her obesity treatment plan along with follow-up of her obesity related diagnoses. Connie Blanchard is on the Category 2 Plan and states she is following her eating plan approximately 10% of the time. Connie Blanchard states she is stationary bike/yoga 30 minutes 1-2 times per week.  Today's visit was #: 8 Starting weight: 224 lbs Starting date: 03/11/2022 Today's weight: 233 lbs Today's date: 08/26/2022 Total lbs lost to date: 0 lbs Total lbs lost since last in-office visit: 0  Interim History: Connie Blanchard has gotten off track since her last visit.  Notes some stress and has been stress eating.  Had tried Phentermine, Mancel Parsons (stopped due to shortage) Saxenda, Metformin and Wellbutrin.  Phentermine was not beneficial.  Has gained 3 lbs of water since her last visit.   Last echo 10/11/20.  Subjective:   1. Gastroesophageal reflux disease, unspecified whether esophagitis present Taking Dexilant 60 mg.  Continues to struggle with reflux.  Has taken Nexium and Protonix in the past.   Wakes her up at night.  Struggling with "my stomach bothering me".    2. Other fatigue Notes fatigue for a long time.   Wakes up feeling tired/fatigued and with headache.  Reports snoring.  Unsure of apneic pauses.   Assessment/Plan:   1. Gastroesophageal reflux disease, unspecified whether esophagitis present  Follow up with GI and continue medications as directed.   Concerned about using GLP-1 due to worsening reflux.    2. Other fatigue Refer for evaluation of  OSAS.  - Ambulatory referral to Sleep Studies  3. Generalized obesity  4. BMI 36.0-36.9,adult Connie Blanchard is currently in the action stage of change. As such, her goal is to continue with weight loss efforts. She has agreed to keeping a food journal and adhering to recommended goals of 1200-1300 calories and ? protein.   Exercise goals: As is.  Consider referring back to cardiology if  continues to gain water weight.  Increase water intake.  Behavioral modification strategies: increasing lean protein intake, increasing water intake, and planning for success.  Connie Blanchard has agreed to follow-up with our clinic in 2 weeks. She was informed of the importance of frequent follow-up visits to maximize her success with intensive lifestyle modifications for her multiple health conditions.   Objective:   Blood pressure 124/74, pulse (!) 59, temperature 97.9 F (36.6 C), height 5' 7"$  (1.702 m), weight 233 lb (105.7 kg), last menstrual period 03/03/2017, SpO2 97 %. Body mass index is 36.49 kg/m.  General: Cooperative, alert, well developed, in no acute distress. HEENT: Conjunctivae and lids unremarkable. Cardiovascular: Regular rhythm.  Lungs: Normal work of breathing. Neurologic: No focal deficits.   Lab Results  Component Value Date   CREATININE 0.90 06/29/2022   BUN 14 06/29/2022   NA 138 06/29/2022   K 3.9 06/29/2022   CL 100 06/29/2022   CO2 30 06/29/2022   Lab Results  Component Value Date   ALT 14 06/29/2022   AST 16 06/29/2022   ALKPHOS 56 06/29/2022   BILITOT 0.6 06/29/2022   Lab Results  Component Value Date   HGBA1C 5.5 06/29/2022   HGBA1C 5.3 03/11/2022   HGBA1C 5.4 07/01/2020   HGBA1C 5.5 03/26/2020   HGBA1C 5.3 03/16/2019   Lab Results  Component Value Date   INSULIN 18.0 03/11/2022   INSULIN 10.8 03/26/2020   INSULIN 23.4 03/16/2019   INSULIN 13.9 09/27/2018   INSULIN 16.0 06/07/2018   Lab Results  Component Value Date   TSH 3.13 06/29/2022   Lab Results  Component Value Date   CHOL 203 (H) 06/29/2022   HDL 61.40 06/29/2022   LDLCALC 116 (H) 06/29/2022   LDLDIRECT 83.0 12/25/2021   TRIG 125.0 06/29/2022   CHOLHDL 3 06/29/2022   Lab Results  Component Value Date   VD25OH 42.8 03/11/2022   VD25OH 35.2 03/26/2020   VD25OH 43.9 03/16/2019   Lab Results  Component Value Date   WBC 4.2 06/29/2022   HGB 13.6 06/29/2022   HCT 38.7  06/29/2022   MCV 89.5 06/29/2022   PLT 219.0 06/29/2022   Lab Results  Component Value Date   IRON 116 05/25/2017   TIBC 397 10/29/2011   Attestation Statements:   Reviewed by clinician on day of visit: allergies, medications, problem list, medical history, surgical history, family history, social history, and previous encounter notes.  I, Brendell Tyus, RMA, am acting as transcriptionist for Everardo Pacific, FNP.  I have reviewed the above documentation for accuracy and completeness, and I agree with the above. Everardo Pacific, FNP

## 2022-09-09 ENCOUNTER — Encounter: Payer: Self-pay | Admitting: Bariatrics

## 2022-09-09 ENCOUNTER — Ambulatory Visit: Payer: 59 | Admitting: Bariatrics

## 2022-09-09 VITALS — BP 106/67 | HR 67 | Temp 98.1°F | Ht 67.0 in | Wt 228.0 lb

## 2022-09-09 DIAGNOSIS — Z6835 Body mass index (BMI) 35.0-35.9, adult: Secondary | ICD-10-CM | POA: Diagnosis not present

## 2022-09-09 DIAGNOSIS — E669 Obesity, unspecified: Secondary | ICD-10-CM

## 2022-09-09 DIAGNOSIS — I10 Essential (primary) hypertension: Secondary | ICD-10-CM

## 2022-09-09 DIAGNOSIS — R632 Polyphagia: Secondary | ICD-10-CM

## 2022-09-15 ENCOUNTER — Other Ambulatory Visit: Payer: Self-pay | Admitting: Family Medicine

## 2022-09-23 NOTE — Progress Notes (Signed)
Chief Complaint:   OBESITY Connie Blanchard is here to discuss her progress with her obesity treatment plan along with follow-up of her obesity related diagnoses. Connie Blanchard is on keeping a food journal and adhering to recommended goals of 1200-1300 calories and ? protein and states she is following her eating plan approximately 75% of the time. Connie Blanchard states she is doing yoga 45-60 minutes 2-3 times per week.  Today's visit was #: 9 Starting weight: 224 lbs Starting date: 03/11/2022 Today's weight: 228 lbs Today's date: 09/09/2022 Total lbs lost to date: 0 Total lbs lost since last in-office visit: 5  Interim History: Connie Blanchard is down 5 lbs since her last visit. She still struggles some with protein.  Subjective:   1. Polyphagia [name] is taking Metformin, and her appetite is better.  2. Essential hypertension [name] is taking Maxzide as directed.  Assessment/Plan:   1. Polyphagia Continue Metformin. Use the Lose-It app  2. Essential hypertension Continue medication No added salt in meals or eating out.  3. Generalized obesity 4. BMI 35.0-35.9,adult Meal planning Intentional eating Increase protein intake. Recipes II  Connie Blanchard is currently in the action stage of change. As such, her goal is to continue with weight loss efforts. She has agreed to keeping a food journal and adhering to recommended goals of 1200-1300 calories and 90-100 grams protein.   Exercise goals:  Continue Yoga  Behavioral modification strategies: increasing lean protein intake, decreasing simple carbohydrates, increasing vegetables, increasing water intake, decreasing eating out, no skipping meals, meal planning and cooking strategies, keeping healthy foods in the home, and planning for success.  Connie Blanchard has agreed to follow-up with our clinic in 3-4 weeks with [name], NP. She was informed of the importance of frequent follow-up visits to maximize her success with intensive lifestyle  modifications for her multiple health conditions.   Objective:   Blood pressure 106/67, pulse 67, temperature 98.1 F (36.7 C), height '5\' 7"'$  (1.702 m), weight 228 lb (103.4 kg), last menstrual period 03/03/2017, SpO2 96 %. Body mass index is 35.71 kg/m.  General: Cooperative, alert, well developed, in no acute distress. HEENT: Conjunctivae and lids unremarkable. Cardiovascular: Regular rhythm.  Lungs: Normal work of breathing. Neurologic: No focal deficits.   Lab Results  Component Value Date   CREATININE 0.90 06/29/2022   BUN 14 06/29/2022   NA 138 06/29/2022   K 3.9 06/29/2022   CL 100 06/29/2022   CO2 30 06/29/2022   Lab Results  Component Value Date   ALT 14 06/29/2022   AST 16 06/29/2022   ALKPHOS 56 06/29/2022   BILITOT 0.6 06/29/2022   Lab Results  Component Value Date   HGBA1C 5.5 06/29/2022   HGBA1C 5.3 03/11/2022   HGBA1C 5.4 07/01/2020   HGBA1C 5.5 03/26/2020   HGBA1C 5.3 03/16/2019   Lab Results  Component Value Date   INSULIN 18.0 03/11/2022   INSULIN 10.8 03/26/2020   INSULIN 23.4 03/16/2019   INSULIN 13.9 09/27/2018   INSULIN 16.0 06/07/2018   Lab Results  Component Value Date   TSH 3.13 06/29/2022   Lab Results  Component Value Date   CHOL 203 (H) 06/29/2022   HDL 61.40 06/29/2022   LDLCALC 116 (H) 06/29/2022   LDLDIRECT 83.0 12/25/2021   TRIG 125.0 06/29/2022   CHOLHDL 3 06/29/2022   Lab Results  Component Value Date   VD25OH 42.8 03/11/2022   VD25OH 35.2 03/26/2020   VD25OH 43.9 03/16/2019   Lab Results  Component Value Date   WBC 4.2  06/29/2022   HGB 13.6 06/29/2022   HCT 38.7 06/29/2022   MCV 89.5 06/29/2022   PLT 219.0 06/29/2022   Lab Results  Component Value Date   IRON 116 05/25/2017   TIBC 397 10/29/2011    Attestation Statements:   Reviewed by clinician on day of visit: allergies, medications, problem list, medical history, surgical history, family history, social history, and previous encounter notes.  I,  Kathlene November, BS, CMA, am acting as transcriptionist for [employer], DO.  I have reviewed the above documentation for accuracy and completeness, and I agree with the above. Jearld Lesch, DO

## 2022-09-28 ENCOUNTER — Encounter: Payer: Self-pay | Admitting: Bariatrics

## 2022-10-01 ENCOUNTER — Ambulatory Visit: Payer: 59 | Admitting: Nurse Practitioner

## 2022-10-05 ENCOUNTER — Other Ambulatory Visit: Payer: Self-pay

## 2022-10-05 DIAGNOSIS — R5383 Other fatigue: Secondary | ICD-10-CM

## 2022-10-19 ENCOUNTER — Other Ambulatory Visit: Payer: Self-pay | Admitting: *Deleted

## 2022-10-19 ENCOUNTER — Ambulatory Visit (INDEPENDENT_AMBULATORY_CARE_PROVIDER_SITE_OTHER): Payer: 59 | Admitting: Primary Care

## 2022-10-19 ENCOUNTER — Encounter: Payer: Self-pay | Admitting: Primary Care

## 2022-10-19 VITALS — BP 106/60 | HR 62 | Ht 67.0 in | Wt 231.4 lb

## 2022-10-19 DIAGNOSIS — G2581 Restless legs syndrome: Secondary | ICD-10-CM

## 2022-10-19 DIAGNOSIS — R0683 Snoring: Secondary | ICD-10-CM | POA: Diagnosis not present

## 2022-10-19 NOTE — Assessment & Plan Note (Signed)
-   Patient reports symptoms of restless leg at night, symptoms do not last particularly long and are not significantly disruptive to patient.  Recommend checking iron/ferritin panel.  If labs are normal we could consider trial of Requip if symptoms are negatively impacting patient's sleep.

## 2022-10-19 NOTE — Assessment & Plan Note (Signed)
-   Patient has symptoms of snoring, insomnia and restless leg symptoms. She also has generalized fatigue without excessive daytime sleepiness.  Epworth score 1. BMI 36. Patient needs home sleep study evaluate for underlying OSA.  Reviewed risk of untreated sleep apnea including cardiac arrhythmias, pulmonary hypertension, diabetes and stroke.  We also discussed treatment options including weight loss, oral appliance, CPAP therapy referral to ENT for possible surgical options.  Encourage side sleeping position.  Advised against driving experiencing excessive daytime sleepiness fatigue.  Follow-up 1 to 2 weeks after sleep study to review results and treatment options if needed.

## 2022-10-19 NOTE — Progress Notes (Signed)
@Patient  ID: Connie Blanchard, female    DOB: [birth year], 52 y.o.   MRN: [id number]  Chief Complaint  Patient presents with   Consult    No sleep study  Epworth 1    Referring provider: Everardo Pacific, *  HPI: 52 year old female, former smoker. PMH significant for HTN, PVCs, acute bronchitis, asthma, LPR, GERD.   10/19/2022 Patient presents today for sleep consult.  Patient has symptoms of insomnia and restless leg.  She has been told by her husband that she snores.  No witnessed apnea.  She has generalized fatigue symptoms which she attributes to menopause.  She does not have excessive daytime sleepiness.  Typical bedtime is between 10 and 11 PM.  It can take anywhere from 35 to 40 minutes to fall asleep.  She will occasionally take melatonin 5 mg at bedtime for insomnia which does help initiate sleep but does not help maintain.  She does take estradiol for menopausal symptoms, dose was recently increased from 0.5 mg to 1 mg.  She sleeps in a cool environment with a fan.  She does wake up with night sweats. She has restless leg symptoms, feels she needs to move her legs. Symptoms do not last long and are felt to be that not bad. Symptoms do not wake her up. No burning sensation.  Denies symptoms of narcolepsy, cataplexy or sleepwalking.  Sleep questionnaire Symptoms- Insomnia, restless sleep, snoring  Prior sleep study- None  Bedtime- 10-11pm  Time to fall asleep- 40min-45min Nocturnal awakenings- 1-2 times  Out of bed/start of day- 6am-7:30am Weight changes- 5-10lbs Do you operate heavy machinery- no Do you currently wear CPAP- no Do you current wear oxygen- no Epworth- 1  Social hx: Patient is married.  She lives with her spouse and children.  She works as an Therapist, sports.  She has not traveled in the last 3 months.  She quit smoking in 2005.  She drinks alcohol occasionally.    Allergies  Allergen Reactions   Sulfa Antibiotics Rash   Penicillins Rash    Has patient had a PCN  reaction causing immediate rash, facial/tongue/throat swelling, SOB or lightheadedness with hypotension: Unknown Has patient had a PCN reaction causing severe rash involving mucus membranes or skin necrosis: Unknown Has patient had a PCN reaction that required hospitalization: No Has patient had a PCN reaction occurring within the last 10 years: No Childhood allergy  If all of the above answers are "NO", then may proceed with Cephalosporin use.     Immunization History  Administered Date(s) Administered   Influenza Split 06/21/2012   Influenza Whole 05/28/2011   Influenza,inj,Quad PF,6+ Mos 04/17/2014, 05/31/2018, 04/07/2019, 06/29/2022   Influenza-Unspecified 05/31/2018, 04/07/2019   Moderna Sars-Covid-2 Vaccination 10/30/2019, 12/04/2019   Novel Infuenza-h1n1-09 07/02/2008   PFIZER(Purple Top)SARS-COV-2 Vaccination 10/27/2019, 11/19/2019   Tdap 07/29/2011, 06/29/2022    Past Medical History:  Diagnosis Date   Acute bronchitis 06/24/2012   Acute bronchitis with asthma 06/24/2012   Allergic state 10/15/2013   Anemia 08/25/2011   Anxiety    Anxiety    not current   At risk for heart disease 04/01/2020   Back pain 10/15/2013   Back pain    Cervical disc disease 04/22/2015   Chronic sinusitis 02/2012   current runny nose of clear drainage   Colon cancer screening 07/29/2011   Sees Dr Donne Hazel of dermatology in [place] of GYN for paps and [facility] and denies any concerns   Constipation    Dental crowns  present    Depression    Depression with anxiety    in the past    Essential hypertension 10/06/2015   Gallbladder polyp 08/06/2020   GERD (gastroesophageal reflux disease)    H/O gestational diabetes mellitus, not currently pregnant 07/29/2011   h/o    Headache    history of migraines   History of blood transfusion    History of blood transfusion    History of gestational diabetes mellitus (GDM) 03/26/2020   History of hiatal hernia    Hyperglycemia  05/25/2017   Hyperlipidemia, mixed 10/06/2015   Hypertension    Insomnia    Insulin resistance 06/29/2018   Iron deficiency anemia    Joint pain    Laryngopharyngeal reflux (LPR) 02/2012   current runny nose of clear drainage   Left lower quadrant pain 08/06/2020   Left shoulder pain 05/26/2017   Leg edema    Nausea 10/15/2013   Obese    Obesity    Other fatigue 10/12/2017   Palpitation    PCOS (polycystic ovarian syndrome)    Pedal edema 0000000   Periumbilical pain [id number]   Pharyngitis 05/07/2019   Postoperative state 03/17/2017   Right shoulder pain 05/25/2017   Right upper quadrant pain 08/06/2020   Shortness of breath on exertion 10/12/2017   Vitamin D deficiency 03/26/2020    Tobacco History: Social History   Tobacco Use  Smoking Status Former   Packs/day: 1.00   Years: 19.00   Additional pack years: 0.00   Total pack years: 19.00   Types: Cigarettes   Quit date: 07/28/2003   Years since quitting: 19.2  Smokeless Tobacco Never   Counseling given: Not Answered   Outpatient Medications Prior to Visit  Medication Sig Dispense Refill   dexlansoprazole (DEXILANT) 60 MG capsule TAKE 1 CAPSULE 20 MINUTES BEFORE BREAKFAST 90 DAYS 90 capsule 4   estradiol (ESTRACE) 1 MG tablet Take 1 mg by mouth daily.     KLOR-CON M20 20 MEQ tablet TAKE 1 TABLET BY MOUTH TWICE A DAY 180 tablet 1   metFORMIN (GLUCOPHAGE) 500 MG tablet Take 1 tablet (500 mg total) by mouth 2 (two) times daily with a meal. 180 tablet 0   ondansetron (ZOFRAN-ODT) 8 MG disintegrating tablet Take 8 mg by mouth 2 (two) times daily as needed.     propranolol (INDERAL) 20 MG tablet Take 1 tablet (20 mg total) by mouth 3 (three) times daily. 270 tablet 3   SUMAtriptan (IMITREX) 50 MG tablet Take 1 tablet (50 mg total) by mouth every 2 (two) hours as needed for migraine or headache. May repeat in 2 hours if headache persists or recurs. 10 tablet 2   triamterene-hydrochlorothiazide (MAXZIDE-25) 37.5-25 MG  tablet TAKE 1 TABLET BY MOUTH EVERY DAY 90 tablet 1   estradiol (ESTRACE) 0.5 MG tablet Take 0.5 mg by mouth daily.     No facility-administered medications prior to visit.    Review of Systems  Review of Systems  Constitutional:  Positive for fatigue.  HENT: Negative.    Respiratory: Negative.    Cardiovascular: Negative.   Psychiatric/Behavioral:  Positive for sleep disturbance.     Physical Exam  BP 106/60 (BP Location: Left Arm, Patient Position: Sitting, Cuff Size: Large)   Pulse 62   Ht 5\' 7"  (1.702 m)   Wt 231 lb 6.4 oz (105 kg)   LMP 03/03/2017 (Exact Date)   SpO2 97%   BMI 36.24 kg/m  Physical Exam Constitutional:      Appearance:  Normal appearance.  HENT:     Head: Normocephalic and atraumatic.     Mouth/Throat:     Mouth: Mucous membranes are moist.     Pharynx: Oropharynx is clear.  Cardiovascular:     Rate and Rhythm: Normal rate and regular rhythm.  Pulmonary:     Breath sounds: Normal breath sounds.  Skin:    General: Skin is warm and dry.  Neurological:     General: No focal deficit present.     Mental Status: She is alert and oriented to person, place, and time. Mental status is at baseline.  Psychiatric:        Mood and Affect: Mood normal.        Behavior: Behavior normal.        Thought Content: Thought content normal.        Judgment: Judgment normal.      Lab Results:  CBC    Component Value Date/Time   WBC 4.2 06/29/2022 0925   RBC 4.33 06/29/2022 0925   HGB 13.6 06/29/2022 0925   HGB 14.4 03/26/2020 1123   HCT 38.7 06/29/2022 0925   HCT 44.0 03/26/2020 1123   PLT 219.0 06/29/2022 0925   PLT 231 03/26/2020 1123   MCV 89.5 06/29/2022 0925   MCV 93 03/26/2020 1123   MCH 32.9 05/01/2021 0721   MCHC 35.2 06/29/2022 0925   RDW 13.5 06/29/2022 0925   RDW 12.3 03/26/2020 1123   LYMPHSABS 1.5 03/26/2020 1123   EOSABS 0.4 03/26/2020 1123   BASOSABS 0.0 03/26/2020 1123    BMET    Component Value Date/Time   NA 138 06/29/2022  0925   NA 138 03/11/2022 0920   K 3.9 06/29/2022 0925   CL 100 06/29/2022 0925   CO2 30 06/29/2022 0925   GLUCOSE 100 (H) 06/29/2022 0925   BUN 14 06/29/2022 0925   BUN 12 03/11/2022 0920   CREATININE 0.90 06/29/2022 0925   CREATININE 0.99 03/31/2016 0952   CALCIUM 9.0 06/29/2022 0925   GFRNONAA >60 05/01/2021 0721   GFRNONAA 68 03/31/2016 0952   GFRAA 78 09/13/2020 1145   GFRAA 78 03/31/2016 0952    BNP No results found for: "BNP"  ProBNP No results found for: "PROBNP"  Imaging: No results found.   Assessment & Plan:   Loud snoring - Patient has symptoms of snoring, insomnia and restless leg symptoms. She also has generalized fatigue without excessive daytime sleepiness.  Epworth score 1. BMI 36. Patient needs home sleep study evaluate for underlying OSA.  Reviewed risk of untreated sleep apnea including cardiac arrhythmias, pulmonary hypertension, diabetes and stroke.  We also discussed treatment options including weight loss, oral appliance, CPAP therapy referral to ENT for possible surgical options.  Encourage side sleeping position.  Advised against driving experiencing excessive daytime sleepiness fatigue.  Follow-up 1 to 2 weeks after sleep study to review results and treatment options if needed.  Restless leg syndrome - Patient reports symptoms of restless leg at night, symptoms do not last particularly long and are not significantly disruptive to patient.  Recommend checking iron/ferritin panel.  If labs are normal we could consider trial of Requip if symptoms are negatively impacting patient's sleep.  Martyn Ehrich, NP 10/19/2022

## 2022-10-19 NOTE — Addendum Note (Signed)
Addended by: Suzzanne Cloud E on: 10/19/2022 11:26 AM   Modules accepted: Orders

## 2022-10-19 NOTE — Patient Instructions (Addendum)
  Sleep apnea is defined as period of 10 seconds or longer when you stop breathing at night. This can happen multiple times a night. Dx sleep apnea is when this occurs more than 5 times an hour.    Mild OSA 5-15 apneic events an hour Moderate OSA 15-30 apneic events an hour Severe OSA > 30 apneic events an hour   Untreated sleep apnea puts you at higher risk for cardiac arrhythmias, pulmonary HTN, stroke and diabetes  Treatment options include weight loss, side sleeping position, oral appliance, CPAP therapy or referral to ENT for possible surgical options    Recommendations: Focus on side sleeping position or elevate head with wedge pillow 30 degrees Work on weight loss efforts if able/ needed  Do not drive if experiencing excessive daytime sleepiness of fatigue    Orders: Home sleep study re: loud snoring (ordered) Ferritin level (ordered)    Follow-up: Please call to schedule follow-up 1-2 weeks after completing home sleep study to review results and treatment if needed (can be virtual)

## 2022-10-19 NOTE — Progress Notes (Signed)
Reviewed and agree with assessment/plan.   Chesley Mires, MD [facility] Pulmonary/Critical Care 10/19/2022, 12:22 PM Pager:  [phone]

## 2022-10-20 LAB — IBC + FERRITIN
Ferritin: 24.2 ng/mL (ref 10.0–291.0)
Iron: 53 ug/dL (ref 42–145)
Saturation Ratios: 11.5 % — ABNORMAL LOW (ref 20.0–50.0)
TIBC: 460.6 ug/dL — ABNORMAL HIGH (ref 250.0–450.0)
Transferrin: 329 mg/dL (ref 212.0–360.0)

## 2022-10-21 NOTE — Progress Notes (Signed)
Please let patient know her iron and ferritin levels were normal. No indication for hematology referral, follow-up with PCP. Not likely causing any restless leg

## 2022-10-22 NOTE — Progress Notes (Signed)
Spoke with pt and notified of results per Beth.  Pt verbalized understanding and denied any questions. 

## 2022-11-24 ENCOUNTER — Ambulatory Visit: Payer: 59 | Admitting: Bariatrics

## 2022-11-30 ENCOUNTER — Encounter: Payer: Self-pay | Admitting: Bariatrics

## 2022-11-30 ENCOUNTER — Other Ambulatory Visit (INDEPENDENT_AMBULATORY_CARE_PROVIDER_SITE_OTHER): Payer: Self-pay | Admitting: Bariatrics

## 2022-11-30 ENCOUNTER — Ambulatory Visit: Payer: 59 | Admitting: Bariatrics

## 2022-11-30 VITALS — BP 124/78 | HR 62 | Temp 97.4°F | Ht 67.0 in | Wt 226.0 lb

## 2022-11-30 DIAGNOSIS — R632 Polyphagia: Secondary | ICD-10-CM

## 2022-11-30 DIAGNOSIS — Z6835 Body mass index (BMI) 35.0-35.9, adult: Secondary | ICD-10-CM

## 2022-11-30 DIAGNOSIS — E669 Obesity, unspecified: Secondary | ICD-10-CM

## 2022-11-30 DIAGNOSIS — I10 Essential (primary) hypertension: Secondary | ICD-10-CM | POA: Diagnosis not present

## 2022-11-30 DIAGNOSIS — I471 Supraventricular tachycardia, unspecified: Secondary | ICD-10-CM

## 2022-11-30 MED ORDER — WEGOVY 0.5 MG/0.5ML ~~LOC~~ SOAJ
0.5000 mg | SUBCUTANEOUS | 0 refills | Status: DC
Start: 2022-11-30 — End: 2022-12-29

## 2022-11-30 MED ORDER — METFORMIN HCL 500 MG PO TABS: 500.0000 mg | ORAL_TABLET | Freq: Two times a day (BID) | ORAL | 0 refills | Status: DC

## 2022-11-30 NOTE — Progress Notes (Signed)
WEIGHT SUMMARY AND BIOMETRICS  Weight Lost Since Last Visit: 2lb  No data recorded  Vitals Temp: (!) 97.4 F (36.3 C) BP: 124/78 Pulse Rate: 62 SpO2: 98 %   Anthropometric Measurements Height: 5\' 7"  (1.702 m) Weight: 226 lb (102.5 kg) BMI (Calculated): 35.39 Weight at Last Visit: 228lb Weight Lost Since Last Visit: 2lb Starting Weight: 224lb Total Weight Loss (lbs): 0 lb (0 kg)   Body Composition  Body Fat %: 37.4 % Fat Mass (lbs): 84.6 lbs Muscle Mass (lbs): 134.6 lbs Total Body Water (lbs): 92.2 lbs Visceral Fat Rating : 10   Other Clinical Data Fasting: no Labs: no Today's Visit #: 10 Starting Date: 03/11/22    OBESITY Connie Blanchard is here to discuss her progress with her obesity treatment plan along with follow-up of her obesity related diagnoses.     Nutrition Plan: keeping a food journal with goal of 1,200 to 1,300 calories and 80 to 90 grams of protein daily - 10% adherence.  Current exercise:  yoga  Interim History:  She is down 2 lbs since her last visit.  Protein intake is less than prescribed., Is skipping meals, and Water intake is inadequate.  Pharmacotherapy: Connie Blanchard is on Metformin 500 mg twice daily with meals Adverse side effects: None Hunger is moderately controlled.  Cravings are moderately controlled.  Assessment/Plan:   1. Polyphagia She denies any contraindications to GLP-1's. She has taken [facility] in the past but stopped. She was having gallbladder issues, but not related to [facility]. She was prescribed Saxenda but unable to get the medication due to the lack of availability.   Plan: Will begin [facility]. She needs pre-authorization.   2. Essential hypertension She is currently well-controlled.  Plan: Continue medication.  3. Generalized obesity Will continue plan and exercise.  4. BMI 35.0-35.9,adult As above.      Generalized Obesity: Current BMI BMI (Calculated): 35.39   Pharmacotherapy Plan Start  Wegovy  0.50 mg SQ weekly  Connie Blanchard is currently in the action stage of change. As such, her goal is to continue with weight loss efforts.  She has agreed to keeping a food journal with goal of 1,200 to 1,500 calories and 90 to 100 grams of protein daily.  Exercise goals: For substantial health benefits, adults should do at least 150 minutes (2 hours and 30 minutes) a week of moderate-intensity, or 75 minutes (1 hour and 15 minutes) a week of vigorous-intensity aerobic physical activity, or an equivalent combination of moderate- and vigorous-intensity aerobic activity. Aerobic activity should be performed in episodes of at least 10 minutes, and preferably, it should be spread throughout the week.  Behavioral modification strategies: increasing lean protein intake, meal planning , increase water intake, and decrease snacking .  Connie Blanchard has agreed to follow-up with our clinic in 4 weeks.   No orders of the defined types were placed in this encounter.   Medications Discontinued During This Encounter  Medication Reason   metFORMIN (GLUCOPHAGE) 500 MG tablet Reorder     Meds ordered this encounter  Medications   metFORMIN (GLUCOPHAGE) 500 MG tablet    Sig: Take 1 tablet (500 mg total) by mouth 2 (two) times daily with a meal.    Dispense:  60 tablet    Refill:  0      Objective:   VITALS: Per patient if applicable, see vitals. GENERAL: Alert and in no acute distress. CARDIOPULMONARY: No increased WOB. Speaking in clear sentences.  PSYCH: Pleasant and cooperative. Speech normal rate and rhythm.  Affect is appropriate. Insight and judgement are appropriate. Attention is focused, linear, and appropriate.  NEURO: Oriented as arrived to appointment on time with no prompting.   Attestation Statements:   This was prepared with the assistance of Engineer, civil (consulting).  Occasional wrong-word or sound-a-like substitutions may have occurred due to the inherent limitations of voice recognition  software.

## 2022-12-07 ENCOUNTER — Telehealth (INDEPENDENT_AMBULATORY_CARE_PROVIDER_SITE_OTHER): Payer: Self-pay | Admitting: Bariatrics

## 2022-12-07 NOTE — Telephone Encounter (Signed)
Pt called 12/07/22 need prior authorization for [facility]

## 2022-12-08 NOTE — Telephone Encounter (Signed)
Left message for patient to return call.

## 2022-12-08 NOTE — Telephone Encounter (Signed)
Started PA for [employer] via covermymeds 

## 2022-12-09 NOTE — Telephone Encounter (Signed)
[employer] approved 12/08/2022 to 07/10/2023.

## 2022-12-29 ENCOUNTER — Encounter: Payer: Self-pay | Admitting: Bariatrics

## 2022-12-29 ENCOUNTER — Ambulatory Visit: Payer: 59 | Admitting: Bariatrics

## 2022-12-29 VITALS — BP 120/75 | HR 61 | Temp 97.4°F | Ht 67.0 in | Wt 226.0 lb

## 2022-12-29 DIAGNOSIS — E669 Obesity, unspecified: Secondary | ICD-10-CM

## 2022-12-29 DIAGNOSIS — I471 Supraventricular tachycardia, unspecified: Secondary | ICD-10-CM

## 2022-12-29 DIAGNOSIS — R632 Polyphagia: Secondary | ICD-10-CM

## 2022-12-29 DIAGNOSIS — I10 Essential (primary) hypertension: Secondary | ICD-10-CM

## 2022-12-29 DIAGNOSIS — Z6835 Body mass index (BMI) 35.0-35.9, adult: Secondary | ICD-10-CM

## 2022-12-29 MED ORDER — WEGOVY 0.5 MG/0.5ML ~~LOC~~ SOAJ
0.5000 mg | SUBCUTANEOUS | 0 refills | Status: DC
Start: 2022-12-29 — End: 2023-02-11

## 2022-12-29 MED ORDER — METFORMIN HCL 500 MG PO TABS
500.0000 mg | ORAL_TABLET | Freq: Every day | ORAL | 0 refills | Status: DC
Start: 2022-12-29 — End: 2023-02-11

## 2022-12-29 MED ORDER — METFORMIN HCL 500 MG PO TABS: 500.0000 mg | ORAL_TABLET | Freq: Two times a day (BID) | ORAL | 0 refills | Status: DC

## 2022-12-29 NOTE — Progress Notes (Signed)
WEIGHT SUMMARY AND BIOMETRICS  Weight Lost Since Last Visit: 0  Weight Gained Since Last Visit: 0   Vitals Temp: (!) 97.4 F (36.3 C) BP: 120/75 Pulse Rate: 61 SpO2: 94 %   Anthropometric Measurements Height: 5\' 7"  (1.702 m) Weight: 226 lb (102.5 kg) BMI (Calculated): 35.39 Weight at Last Visit: 226lb Weight Lost Since Last Visit: 0 Weight Gained Since Last Visit: 0 Starting Weight: 224lb Total Weight Loss (lbs): 0 lb (0 kg)   Body Composition  Body Fat %: 35.8 % Fat Mass (lbs): 81.2 lbs Muscle Mass (lbs): 138.2 lbs Total Body Water (lbs): 91.6 lbs Visceral Fat Rating : 10   Other Clinical Data Fasting: no Labs: no Today's Visit #: 11 Starting Date: 03/11/22    OBESITY Connie Blanchard is here to discuss her progress with her obesity treatment plan along with follow-up of her obesity related diagnoses.     Nutrition Plan: the Category 2 plan - 80-85 % adherence.  Current exercise: walking  Interim History:  Her weight remains the same.  Is not skipping meals and Meeting protein goals.  Pharmacotherapy: Connie Blanchard is on Wegovy 0.50 mg SQ weekly Adverse side effects: Constipation Hunger is moderately controlled.  Cravings are moderately controlled.  Assessment/Plan:   1. SVT (supraventricular tachycardia) She is doing well. Taking Propranolol as directed. It is not affecting her exercise.   Plan: Continue medications.   Exercise as tolerated.    2. Essential hypertension Hypertension Hypertension well controlled.  Medication(s): Maxzide 37.5-25 mg tablets daily  BP Readings from Last 3 Encounters:  12/29/22 120/75  11/30/22 124/78  10/19/22 106/60   Lab Results  Component Value Date   CREATININE 0.90 06/29/2022   CREATININE 0.83 03/11/2022   CREATININE 1.08 12/25/2021   Lab Results  Component Value Date   GFR 72.91 06/29/2022   GFR 58.80 (L) 12/25/2021   GFR 72.02 07/01/2020    Plan: Continue all antihypertensives at  current dosages. No added salt. Will keep sodium content to 1,500 mg or less per day.    Polyphagia Connie Blanchard endorses excessive hunger.  Medication(s): Metformin 500 mg daily and Wegovy 0.5 mg into the skin weekly Effects of medication:  moderately controlled. Cravings are moderately controlled.   Plan: Medication(s): Wegovy 0.50 mg SQ weekly Will increase water, protein and fiber to help assuage hunger.  Will minimize foods that have a high glucose index/load to minimize reactive hypoglycemia.     Generalized Obesity: Current BMI BMI (Calculated): 35.39   Pharmacotherapy Plan Continue and refill  Wegovy 0.50 mg SQ weekly  Connie Blanchard is currently in the action stage of change. As such, her goal is to continue with weight loss efforts.  She has agreed to keeping a food journal with goal of 1,200 to 1,300  calories and 80 grams of protein daily.  Exercise goals: For substantial health benefits, adults should do at least 150 minutes (2 hours and 30 minutes) a week of moderate-intensity, or 75 minutes (1 hour and 15 minutes) a week of vigorous-intensity aerobic physical activity, or an equivalent combination of moderate- and vigorous-intensity aerobic activity. Aerobic activity should be performed in episodes of at least 10 minutes, and preferably, it should be spread throughout the week.  Behavioral modification strategies: increasing lean protein intake, no meal skipping, and planning for success.  Connie Blanchard has agreed to follow-up with our clinic in 4 weeks.      Objective:   VITALS: Per patient if applicable, see vitals. GENERAL: Alert and in no acute distress. CARDIOPULMONARY:  No increased WOB. Speaking in clear sentences.  PSYCH: Pleasant and cooperative. Speech normal rate and rhythm. Affect is appropriate. Insight and judgement are appropriate. Attention is focused, linear, and appropriate.  NEURO: Oriented as arrived to appointment on time with no prompting.   Attestation  Statements:    This was prepared with the assistance of Engineer, civil (consulting).  Occasional wrong-word or sound-a-like substitutions may have occurred due to the inherent limitations of voice recognition software.   Corinna Capra, DO

## 2022-12-30 ENCOUNTER — Other Ambulatory Visit: Payer: Self-pay | Admitting: Family Medicine

## 2023-01-10 ENCOUNTER — Other Ambulatory Visit: Payer: Self-pay | Admitting: Family Medicine

## 2023-02-02 ENCOUNTER — Ambulatory Visit: Payer: 59 | Admitting: Bariatrics

## 2023-02-08 ENCOUNTER — Encounter: Payer: Self-pay | Admitting: Family Medicine

## 2023-02-08 DIAGNOSIS — I10 Essential (primary) hypertension: Secondary | ICD-10-CM

## 2023-02-08 DIAGNOSIS — E782 Mixed hyperlipidemia: Secondary | ICD-10-CM

## 2023-02-08 DIAGNOSIS — R600 Localized edema: Secondary | ICD-10-CM

## 2023-02-08 MED ORDER — SUMATRIPTAN SUCCINATE 50 MG PO TABS
50.0000 mg | ORAL_TABLET | ORAL | 2 refills | Status: AC | PRN
Start: 2023-02-08 — End: ?

## 2023-02-11 ENCOUNTER — Encounter: Payer: Self-pay | Admitting: Bariatrics

## 2023-02-11 ENCOUNTER — Ambulatory Visit: Payer: 59 | Admitting: Bariatrics

## 2023-02-11 DIAGNOSIS — I10 Essential (primary) hypertension: Secondary | ICD-10-CM

## 2023-02-11 DIAGNOSIS — E669 Obesity, unspecified: Secondary | ICD-10-CM

## 2023-02-11 DIAGNOSIS — R632 Polyphagia: Secondary | ICD-10-CM

## 2023-02-11 DIAGNOSIS — Z6834 Body mass index (BMI) 34.0-34.9, adult: Secondary | ICD-10-CM

## 2023-02-11 DIAGNOSIS — Z6835 Body mass index (BMI) 35.0-35.9, adult: Secondary | ICD-10-CM

## 2023-02-11 MED ORDER — WEGOVY 0.5 MG/0.5ML ~~LOC~~ SOAJ
0.5000 mg | SUBCUTANEOUS | 0 refills | Status: DC
Start: 2023-02-11 — End: 2023-04-13

## 2023-02-11 MED ORDER — METFORMIN HCL 500 MG PO TABS
500.0000 mg | ORAL_TABLET | Freq: Every day | ORAL | 0 refills | Status: DC
Start: 2023-02-11 — End: 2023-03-02

## 2023-02-11 NOTE — Progress Notes (Signed)
WEIGHT SUMMARY AND BIOMETRICS  Weight Lost Since Last Visit: 3lb   Vitals Temp: 97.7 F (36.5 C) BP: 116/72 Pulse Rate: 61 SpO2: 97 %   Anthropometric Measurements Height: 5\' 7"  (1.702 m) Weight: 223 lb (101.2 kg) BMI (Calculated): 34.92 Weight at Last Visit: 226lb Weight Lost Since Last Visit: 3lb Starting Weight: 224lb Total Weight Loss (lbs): 1 lb (0.454 kg)   Body Composition  Body Fat %: 35.8 % Fat Mass (lbs): 79.8 lbs Muscle Mass (lbs): 79.8 lbs Total Body Water (lbs): 92.6 lbs Visceral Fat Rating : 10   Other Clinical Data Fasting: no Labs: no Today's Visit #: 12 Starting Date: 03/11/22    OBESITY Connie Blanchard is here to discuss her progress with her obesity treatment plan along with follow-up of her obesity related diagnoses.     Nutrition Plan: the Category 2 plan - 75% adherence.  Current exercise:  yoga and stationary bike.  Interim History:  She is down 3 lbs since her last visit. She has been on vacation.  Eating all of the food on the plan., Protein intake is as prescribed, and Water intake is adequate.  Pharmacotherapy: Connie Blanchard is on Wegovy 0.50 mg SQ weekly Adverse side effects: Constipation Hunger is moderately controlled.  Cravings are moderately controlled.  Will Papaya enzymes.   Assessment/Plan:   Connie Blanchard endorses excessive hunger.  Connie Blanchard  Effects of medication:  moderately controlled. Cravings are moderately controlled.     Plan: Medication(s): Wegovy 0.50 mg SQ weekly Will increase water, protein and fiber to help assuage hunger.  Will minimize foods that have a high glucose index/load to minimize reactive hypoglycemia.   2. Essential hypertension Hypertension Hypertension well controlled.  Medication(s): Maxzide 37.5/25 mg daily , Propranolol 20 mg  BP Readings from Last 3 Encounters:  02/11/23 116/72  12/29/22 120/75  11/30/22 124/78   Lab Results  Component Value Date    CREATININE 0.90 06/29/2022   CREATININE 0.83 03/11/2022   CREATININE 1.08 12/25/2021   Lab Results  Component Value Date   GFR 72.91 06/29/2022   GFR 58.80 (L) 12/25/2021   GFR 72.02 07/01/2020    Plan: Continue all antihypertensives at current dosages. No added salt. Will keep sodium content to 1,500 mg or less per day.      Generalized Obesity: Current BMI BMI (Calculated): 34.92   Pharmacotherapy Plan Continue and refill  Wegovy 0.50 mg SQ weekly Metformin 500 mg 1 in the am # 30 with 0 refills.   Connie Blanchard is currently in the action stage of change. As such, her goal is to continue with weight loss efforts.  She has agreed to the Category 2 plan.  Exercise goals: All adults should avoid inactivity. Some physical activity is better than none, and adults who participate in any amount of physical activity gain some health benefits.  Behavioral modification strategies: increasing lean protein intake, decrease eating out, increase water intake, increasing vegetables, and mindful eating.  Connie Blanchard has agreed to follow-up with our clinic in 4 weeks.       Objective:   VITALS: Per patient if applicable, see vitals. GENERAL: Alert and in no acute distress. CARDIOPULMONARY: No increased WOB. Speaking in clear sentences.  PSYCH: Pleasant and cooperative. Speech normal rate and rhythm. Affect is appropriate. Insight and judgement are appropriate. Attention is focused, linear, and appropriate.  NEURO: Oriented as arrived to appointment on time with no prompting.   Attestation Statements:    This was prepared with the assistance of [employer]  Dictation.  Occasional wrong-word or sound-a-like substitutions may have occurred due to the inherent limitations of voice recognition software.   Corinna Capra, DO

## 2023-03-02 ENCOUNTER — Other Ambulatory Visit: Payer: Self-pay | Admitting: Bariatrics

## 2023-03-02 DIAGNOSIS — R632 Polyphagia: Secondary | ICD-10-CM

## 2023-03-10 ENCOUNTER — Ambulatory Visit: Payer: 59 | Admitting: Bariatrics

## 2023-03-24 ENCOUNTER — Other Ambulatory Visit: Payer: Self-pay | Admitting: Family Medicine

## 2023-03-28 ENCOUNTER — Other Ambulatory Visit: Payer: Self-pay | Admitting: Family Medicine

## 2023-04-13 ENCOUNTER — Encounter (HOSPITAL_BASED_OUTPATIENT_CLINIC_OR_DEPARTMENT_OTHER): Payer: Self-pay

## 2023-04-13 ENCOUNTER — Encounter: Payer: Self-pay | Admitting: Bariatrics

## 2023-04-13 ENCOUNTER — Ambulatory Visit: Payer: 59 | Admitting: Bariatrics

## 2023-04-13 ENCOUNTER — Other Ambulatory Visit (HOSPITAL_BASED_OUTPATIENT_CLINIC_OR_DEPARTMENT_OTHER): Payer: Self-pay

## 2023-04-13 DIAGNOSIS — Z6836 Body mass index (BMI) 36.0-36.9, adult: Secondary | ICD-10-CM

## 2023-04-13 DIAGNOSIS — E669 Obesity, unspecified: Secondary | ICD-10-CM | POA: Diagnosis not present

## 2023-04-13 DIAGNOSIS — R632 Polyphagia: Secondary | ICD-10-CM

## 2023-04-13 DIAGNOSIS — I10 Essential (primary) hypertension: Secondary | ICD-10-CM | POA: Diagnosis not present

## 2023-04-13 DIAGNOSIS — Z6835 Body mass index (BMI) 35.0-35.9, adult: Secondary | ICD-10-CM

## 2023-04-13 MED ORDER — WEGOVY 0.5 MG/0.5ML ~~LOC~~ SOAJ
0.5000 mg | SUBCUTANEOUS | 0 refills | Status: DC
  Filled 2023-04-13: qty 2, 28d supply, fill #0

## 2023-04-13 MED ORDER — METFORMIN HCL 500 MG PO TABS
500.0000 mg | ORAL_TABLET | Freq: Every day | ORAL | 0 refills | Status: DC
Start: 2023-04-13 — End: 2023-06-17
  Filled 2023-04-13: qty 90, 90d supply, fill #0

## 2023-04-13 NOTE — Progress Notes (Signed)
WEIGHT SUMMARY AND BIOMETRICS  Weight Gained Since Last Visit: 7lb   Vitals Temp: 97.6 F (36.4 C) BP: 134/82 Pulse Rate: (!) 56 SpO2: 98 %   Anthropometric Measurements Height: 5\' 7"  (1.702 m) Weight: 230 lb (104.3 kg) BMI (Calculated): 36.01 Weight at Last Visit: 223lb Weight Gained Since Last Visit: 7lb Starting Weight: 224lb Total Weight Loss (lbs): 0 lb (0 kg)   Body Composition  Body Fat %: 36.6 % Fat Mass (lbs): 84.2 lbs Muscle Mass (lbs): 138.6 lbs Total Body Water (lbs): 94.4 lbs Visceral Fat Rating : 10   Other Clinical Data Fasting: no Labs: no Today's Visit #: 13 Starting Date: 03/11/22    OBESITY Connie Blanchard is here to discuss her progress with her obesity treatment plan along with follow-up of her obesity related diagnoses.     Nutrition Plan: the Category 2 plan - 25% adherence.  Current exercise:  Yoga  Interim History:  She is up 7 lbs since her last visit. She has been out of Wegovy due to her pharmacy being out of the medicaiton and has not called other pharmacies Is not skipping meals, Meeting protein goals., and Water intake is adequate.  Pharmacotherapy: Connie Blanchard is on Wegovy 0.50 mg SQ weekly and Metformin 500 mg once daily breakfast Adverse side effects: None Hunger is poorly controlled.  Cravings are moderately controlled.  Assessment/Plan:   1. Polyphagia Polyphagia Connie Blanchard endorses excessive hunger. She has not been on the Connie Blanchard due to availability of the medicaiton.  Medication(s): QMVHQI and Metformin Effects of medication:  poorly controlled. Cravings are moderately controlled.   Plan: Medication(s): Wegovy 0.50 mg SQ weekly and Metformin 500 mg once daily breakfast Will increase water, protein and fiber to help assuage hunger. She will minimize snacking at night.  Will minimize foods that have a high glucose index/load to minimize reactive hypoglycemia.  She will use distraction techniques to avoid  snacking.  Healthy recipes given.   Hypertension Hypertension reasonably well controlled.  Medication(s): Maxzide 37.5/25 mg daily   BP Readings from Last 3 Encounters:  04/13/23 134/82  02/11/23 116/72  12/29/22 120/75   Lab Results  Component Value Date   CREATININE 0.90 06/29/2022   CREATININE 0.83 03/11/2022   CREATININE 1.08 12/25/2021   Lab Results  Component Value Date   GFR 72.91 06/29/2022   GFR 58.80 (L) 12/25/2021   GFR 72.02 07/01/2020    Plan: Continue all antihypertensives at current dosages. No added salt. Will keep sodium content to 1,500 mg or less per day.      Generalized Obesity: Current BMI BMI (Calculated): 36.01   Pharmacotherapy Plan Continue and refill  Wegovy 0.50 mg SQ weekly and Metformin 500 mg once daily breakfast  Connie Blanchard is currently in the action stage of change. As such, her goal is to continue with weight loss efforts.  She has agreed to the Category 2 plan.  Exercise goals: All adults should avoid inactivity. Some physical activity is better than none, and adults who participate in any amount of physical activity gain some health benefits.  Behavioral modification strategies: increasing lean protein intake, better snacking choices, planning for success, and mindful eating.  Connie Blanchard has agreed to follow-up with our clinic in 4 weeks.       Objective:   VITALS: Per patient if applicable, see vitals. GENERAL: Alert and in no acute distress. CARDIOPULMONARY: No increased WOB. Speaking in clear sentences.  PSYCH: Pleasant and cooperative. Speech normal rate and rhythm. Affect is appropriate. Insight and judgement  are appropriate. Attention is focused, linear, and appropriate.  NEURO: Oriented as arrived to appointment on time with no prompting.   Attestation Statements:    This was prepared with the assistance of Engineer, civil (consulting).  Occasional wrong-word or sound-a-like substitutions may have occurred due to the inherent  limitations of voice recognition software. Corinna Capra, DO

## 2023-04-30 ENCOUNTER — Encounter: Payer: Self-pay | Admitting: Cardiology

## 2023-05-02 ENCOUNTER — Encounter: Payer: Self-pay | Admitting: Family Medicine

## 2023-05-10 ENCOUNTER — Other Ambulatory Visit: Payer: Self-pay | Admitting: Bariatrics

## 2023-05-10 DIAGNOSIS — E669 Obesity, unspecified: Secondary | ICD-10-CM

## 2023-05-10 DIAGNOSIS — Z6835 Body mass index (BMI) 35.0-35.9, adult: Secondary | ICD-10-CM

## 2023-05-10 DIAGNOSIS — R632 Polyphagia: Secondary | ICD-10-CM

## 2023-05-10 DIAGNOSIS — I10 Essential (primary) hypertension: Secondary | ICD-10-CM

## 2023-05-11 ENCOUNTER — Other Ambulatory Visit (HOSPITAL_BASED_OUTPATIENT_CLINIC_OR_DEPARTMENT_OTHER): Payer: Self-pay

## 2023-05-12 ENCOUNTER — Other Ambulatory Visit (HOSPITAL_BASED_OUTPATIENT_CLINIC_OR_DEPARTMENT_OTHER): Payer: Self-pay

## 2023-05-12 ENCOUNTER — Other Ambulatory Visit: Payer: Self-pay | Admitting: Bariatrics

## 2023-05-12 ENCOUNTER — Telehealth: Payer: Self-pay | Admitting: Bariatrics

## 2023-05-12 DIAGNOSIS — Z6835 Body mass index (BMI) 35.0-35.9, adult: Secondary | ICD-10-CM

## 2023-05-12 DIAGNOSIS — E669 Obesity, unspecified: Secondary | ICD-10-CM

## 2023-05-12 DIAGNOSIS — R632 Polyphagia: Secondary | ICD-10-CM

## 2023-05-12 DIAGNOSIS — I10 Essential (primary) hypertension: Secondary | ICD-10-CM

## 2023-05-12 MED ORDER — WEGOVY 0.5 MG/0.5ML ~~LOC~~ SOAJ
0.5000 mg | SUBCUTANEOUS | 0 refills | Status: DC
  Filled 2023-05-12: qty 2, 28d supply, fill #0

## 2023-05-12 NOTE — Telephone Encounter (Signed)
Pt is calling and need a refill on wegovy .5mg . She took her last dosage. Can you please send a refill in? Pt has an appointment schedule on 05/17/23    Thank you,

## 2023-05-13 ENCOUNTER — Other Ambulatory Visit (HOSPITAL_BASED_OUTPATIENT_CLINIC_OR_DEPARTMENT_OTHER): Payer: Self-pay

## 2023-05-13 ENCOUNTER — Other Ambulatory Visit: Payer: Self-pay

## 2023-05-17 ENCOUNTER — Ambulatory Visit: Payer: 59 | Admitting: Bariatrics

## 2023-05-17 ENCOUNTER — Other Ambulatory Visit (HOSPITAL_BASED_OUTPATIENT_CLINIC_OR_DEPARTMENT_OTHER): Payer: Self-pay

## 2023-05-17 ENCOUNTER — Encounter: Payer: Self-pay | Admitting: Bariatrics

## 2023-05-17 VITALS — BP 127/82 | HR 63 | Temp 97.5°F | Ht 67.0 in | Wt 226.0 lb

## 2023-05-17 DIAGNOSIS — R632 Polyphagia: Secondary | ICD-10-CM

## 2023-05-17 DIAGNOSIS — Z6835 Body mass index (BMI) 35.0-35.9, adult: Secondary | ICD-10-CM | POA: Diagnosis not present

## 2023-05-17 DIAGNOSIS — I10 Essential (primary) hypertension: Secondary | ICD-10-CM

## 2023-05-17 DIAGNOSIS — E669 Obesity, unspecified: Secondary | ICD-10-CM | POA: Diagnosis not present

## 2023-05-17 MED ORDER — WEGOVY 0.5 MG/0.5ML ~~LOC~~ SOAJ
0.5000 mg | SUBCUTANEOUS | 0 refills | Status: DC
  Filled 2023-06-17: qty 2, 28d supply, fill #0

## 2023-05-17 NOTE — Progress Notes (Signed)
WEIGHT SUMMARY AND BIOMETRICS  Weight Lost Since Last Visit: 4lb  Weight Gained Since Last Visit: 0   Vitals Temp: (!) 97.5 F (36.4 C) BP: 127/82 Pulse Rate: 63 SpO2: 98 %   Anthropometric Measurements Height: 5\' 7"  (1.702 m) Weight: 226 lb (102.5 kg) BMI (Calculated): 35.39 Weight at Last Visit: 230lb Weight Lost Since Last Visit: 4lb Weight Gained Since Last Visit: 0 Starting Weight: 224lb Total Weight Loss (lbs): 0 lb (0 kg)   Body Composition  Body Fat %: 36.9 % Fat Mass (lbs): 83.4 lbs Muscle Mass (lbs): 135.4 lbs Total Body Water (lbs): 90.6 lbs Visceral Fat Rating : 10   Other Clinical Data Fasting: yes Labs: no Today's Visit #: 14 Starting Date: 03/11/22    OBESITY Connie Blanchard is here to discuss her progress with her obesity treatment plan along with follow-up of her obesity related diagnoses.     Nutrition Plan: the Category 2 plan - 50% adherence.  Current exercise:  yoga  Interim History:  She is down 4 lbs.  Eating all of the food on the plan., Protein intake is as prescribed, Water intake is adequate., and Denies polyphagia  Pharmacotherapy: Connie Blanchard is on Wegovy 0.50 mg SQ weekly Adverse side effects: None Hunger is moderately controlled.  Cravings are moderately controlled.   Assessment/Plan:   1. Polyphagia Polyphagia Connie Blanchard endorses excessive hunger.  Medication(s): MVHQIO Effects of medication:  moderately controlled. Cravings are moderately controlled.   Plan: Medication(s): Wegovy 0.50 mg SQ weekly Will increase water, protein and fiber to help assuage hunger.  Will minimize foods that have a high glucose index/load to minimize reactive hypoglycemia.    2. Essential hypertension Hypertension Hypertension stable.  Medication(s): Propranolol 20 mg  BP Readings from Last 3 Encounters:  05/17/23 127/82  04/13/23 134/82  02/11/23 116/72   Lab Results  Component Value Date   CREATININE 0.90 06/29/2022    CREATININE 0.83 03/11/2022   CREATININE 1.08 12/25/2021   Lab Results  Component Value Date   GFR 72.91 06/29/2022   GFR 58.80 (L) 12/25/2021   GFR 72.02 07/01/2020    Plan: Continue all antihypertensives at current dosages. No added salt. Will keep sodium content to 1,500 mg or less per day.      Generalized Obesity: Current BMI BMI (Calculated): 35.39   Pharmacotherapy Plan Continue and refill  Wegovy 0.50 mg SQ weekly  Connie Blanchard is currently in the action stage of change. As such, her goal is to continue with weight loss efforts.  She has agreed to the Category 2 plan.  Exercise goals: Adults should also include muscle-strengthening activities that involve all major muscle groups on 2 or more days a week.  Behavioral modification strategies: increasing lean protein intake, decreasing simple carbohydrates , no meal skipping, increase water intake, better snacking choices, increasing fiber rich foods, and mindful eating.  Connie Blanchard has agreed to follow-up with our clinic in 4 weeks.       Objective:   VITALS: Per patient if applicable, see vitals. GENERAL: Alert and in no acute distress. CARDIOPULMONARY: No increased WOB. Speaking in clear sentences.  PSYCH: Pleasant and cooperative. Speech normal rate and rhythm. Affect is appropriate. Insight and judgement are appropriate. Attention is focused, linear, and appropriate.  NEURO: Oriented as arrived to appointment on time with no prompting.   Attestation Statements:    This was prepared with the assistance of Engineer, civil (consulting).  Occasional wrong-word or sound-a-like substitutions may have occurred due to the inherent limitations of voice  recognition software.

## 2023-05-24 ENCOUNTER — Telehealth: Payer: Self-pay | Admitting: Cardiology

## 2023-05-24 ENCOUNTER — Other Ambulatory Visit (HOSPITAL_BASED_OUTPATIENT_CLINIC_OR_DEPARTMENT_OTHER): Payer: Self-pay

## 2023-05-24 MED ORDER — PROPRANOLOL HCL 20 MG PO TABS
20.0000 mg | ORAL_TABLET | Freq: Three times a day (TID) | ORAL | 0 refills | Status: DC
  Filled 2023-05-24: qty 90, 30d supply, fill #0

## 2023-05-24 NOTE — Telephone Encounter (Signed)
*  STAT* If patient is at the pharmacy, call can be transferred to refill team.   1. Which medications need to be refilled? (please list name of each medication and dose if known) propranolol (INDERAL) 20 MG tablet   2. Which pharmacy/location (including street and city if local pharmacy) is medication to be sent to?  [facility] Phone: [phone]  Fax: [phone]      3. Do they need a 30 day or 90 day supply? 90 Pt has appt on 05/28/23

## 2023-05-24 NOTE — Telephone Encounter (Signed)
Pt's medication was sent to pt's pharmacy as requested. Confirmation received.  °

## 2023-05-27 NOTE — Progress Notes (Signed)
Cardiology Office Note    Date:  05/28/2023  ID:  [name], DOB 10-Sep-1970, MRN [id number] PCP:  Bradd Canary, MD  Cardiologist:  Thomasene Ripple, DO  Electrophysiologist:  None   Chief Complaint: Follow up for PSVT and nonsustained VT  History of Present Illness: Connie Blanchard is a 52 y.o. female with visit-pertinent history of anxiety, gestational diabetes, obesity, migraine headaches, hypertension, paroxysmal supraventricular tachycardia and nonsustained ventricular tachycardia.  First evaluated by Dr. Servando Salina in 08/2020 for intermittent palpitations as well as shortness of breath.  It was recommended that she obtain a Kardia mobile for monitoring, she was started on propranolol as needed for heart rate greater than 130 bpm. In early 2023 she notified the office of worsened palpitations, she wore a cardiac monitor that showed her predominant underlying rhythm was sinus rhythm with an average heart rate of 72 bpm, ranging from 55 to 184 bpm.  She had 1 run of ventricular tachycardia lasting 4 beats with a max rate of 146 bpm she had 4 runs of supraventricular tachycardia, the run with the fastest interval lasting 8 beats with a max rate of 184 bpm, the lasted lasting 10 beats with an average rate of 105 bpm.  SVT was detected with in 45 seconds of patient triggered events.  She was last seen in clinic on 11/17/2021 for follow-up regarding her cardiac monitor.  Her propranolol was transitioned to daily.  Patient also reported intermittent chest pain.  She underwent coronary CTA on 12/09/2021 which showed a coronary calcium score of 1.53, this was 79th percentile for age, sex and race matched controls.  She had minimal calcified plaque in the distal RCA. It was recommended she started on Crestor 5 mg daily, deferred.   Today she presents for follow up. She reports that she is doing well. Notes she has hot flashes which causes an increased heart rate, usually occurring in the morning upon  awakening.  She has an ice pack that she wraps around her shoulder, notes significant improvement with use.  She has checked her heart rhythm with Kardia mobile and it shows as sinus.  She otherwise denies palpitations.  She denies chest pain or shortness of breath.  She reports that she has been doing hot yoga weekly and tolerating very well.  Labwork independently reviewed: 06/29/22: Sodium 138, potassium 3.9, creatinine 0.90, AST 16, ALT 14, total cholesterol 203, HDL 61, triglycerides 125 and LDL 116, TSH 3.13   ROS: .   Today she denies chest pain, shortness of breath, lower extremity edema, fatigue, melena, hematuria, hemoptysis, diaphoresis, weakness, presyncope, syncope, orthopnea, and PND.  All other systems are reviewed and otherwise negative. Studies Reviewed: Marland Kitchen    EKG:  EKG is ordered today, personally reviewed, demonstrating  EKG Interpretation Date/Time:  Friday May 28 2023 09:39:41 EDT Ventricular Rate:  70 PR Interval:  166 QRS Duration:  94 QT Interval:  426 QTC Calculation: 460 R Axis:   8  Text Interpretation: Normal sinus rhythm Normal ECG No significant change since last tracing Confirmed by Reather Littler [phone]) on 05/28/2023 10:09:02 AM   CV Studies:  Cardiac Studies & Procedures       ECHOCARDIOGRAM  ECHOCARDIOGRAM COMPLETE 10/11/2020  Narrative ECHOCARDIOGRAM REPORT    Patient Name:   Connie Blanchard Date of Exam: 10/11/2020 Medical Rec #:  [id number]         Height:       67.0 in Accession #:  [id number]        Weight:       228.0 lb Date of Birth:  08/07/1970          BSA:          2.138 m Patient Age:    49 years          BP:           110/80 mmHg Patient Gender: F                 HR:           80 bpm. Exam Location:  [place] [facility]  Procedure: 2D Echo, Cardiac Doppler and Color Doppler  Indications:    R00.2 Palpitations; R06.02 SOB  History:        Patient has no prior history of Echocardiogram examinations. Signs/Symptoms:Shortness of Breath;  Risk Factors:Hypertension, Dyslipidemia and Non-Smoker.  Sonographer:    Joaquim Nam Referring Phys: [phone] ROBERT J KRASOWSKI  IMPRESSIONS   1. Left ventricular ejection fraction, by estimation, is 60 to 65%. The left ventricle has normal function. The left ventricle has no regional wall motion abnormalities. Left ventricular diastolic parameters are consistent with Grade I diastolic dysfunction (impaired relaxation). 2. Right ventricular systolic function is normal. The right ventricular size is normal. 3. The mitral valve is normal in structure. No evidence of mitral valve regurgitation. No evidence of mitral stenosis. 4. The aortic valve is normal in structure. Aortic valve regurgitation is not visualized. No aortic stenosis is present. 5. The inferior vena cava is normal in size with greater than 50% respiratory variability, suggesting right atrial pressure of 3 mmHg.  FINDINGS Left Ventricle: Left ventricular ejection fraction, by estimation, is 60 to 65%. The left ventricle has normal function. The left ventricle has no regional wall motion abnormalities. The left ventricular internal cavity size was normal in size. There is no left ventricular hypertrophy. Left ventricular diastolic parameters are consistent with Grade I diastolic dysfunction (impaired relaxation).  Right Ventricle: The right ventricular size is normal. No increase in right ventricular wall thickness. Right ventricular systolic function is normal.  Left Atrium: Left atrial size was normal in size.  Right Atrium: Right atrial size was normal in size.  Pericardium: There is no evidence of pericardial effusion.  Mitral Valve: The mitral valve is normal in structure. No evidence of mitral valve regurgitation. No evidence of mitral valve stenosis.  Tricuspid Valve: The tricuspid valve is normal in structure. Tricuspid valve regurgitation is not demonstrated. No evidence of tricuspid stenosis.  Aortic Valve: The  aortic valve is normal in structure. Aortic valve regurgitation is not visualized. No aortic stenosis is present.  Pulmonic Valve: The pulmonic valve was normal in structure. Pulmonic valve regurgitation is not visualized. No evidence of pulmonic stenosis.  Aorta: The aortic root is normal in size and structure.  Venous: The inferior vena cava is normal in size with greater than 50% respiratory variability, suggesting right atrial pressure of 3 mmHg.  IAS/Shunts: No atrial level shunt detected by color flow Doppler.   LEFT VENTRICLE PLAX 2D LVIDd:         4.85 cm  Diastology LVIDs:         2.69 cm  LV e' medial:    5.98 cm/s LV PW:         0.94 cm  LV E/e' medial:  11.8 LV IVS:        1.31 cm  LV e' lateral:   8.59 cm/s LVOT diam:  2.20 cm  LV E/e' lateral: 8.2 LV SV:         73 LV SV Index:   34 LVOT Area:     3.80 cm   RIGHT VENTRICLE RV S prime:     11.60 cm/s TAPSE (M-mode): 2.9 cm  LEFT ATRIUM             Index       RIGHT ATRIUM           Index LA diam:        3.20 cm 1.50 cm/m  RA Area:     10.30 cm LA Vol (A2C):   35.7 ml 16.70 ml/m RA Volume:   20.30 ml  9.49 ml/m LA Vol (A4C):   54.8 ml 25.63 ml/m LA Biplane Vol: 45.3 ml 21.19 ml/m AORTIC VALVE LVOT Vmax:   77.70 cm/s LVOT Vmean:  55.800 cm/s LVOT VTI:    0.191 m  AORTA Ao Root diam: 3.00 cm Ao Asc diam:  3.00 cm  MITRAL VALVE MV Area (PHT): 3.50 cm    SHUNTS MV Decel Time: 217 msec    Systemic VTI:  0.19 m MV E velocity: 70.70 cm/s  Systemic Diam: 2.20 cm MV A velocity: 69.80 cm/s MV E/A ratio:  1.01  Belva Crome MD Electronically signed by Belva Crome MD Signature Date/Time: 10/11/2020/12:01:08 PM    Final    MONITORS  LONG TERM MONITOR (3-14 DAYS) 11/13/2021  Narrative Patch Wear Time:  13 days and 9 hours (2023-03-25T07:59:10-398 to 2023-04-07T17:06:52-0400)  Patient had a min HR of 55 bpm, max HR of 184 bpm, and avg HR of 72 bpm. Predominant underlying rhythm was Sinus  Rhythm. 1 run of Ventricular Tachycardia occurred lasting 4 beats with a max rate of 146 bpm (avg 130 bpm). 4 Supraventricular Tachycardia runs occurred, the run with the fastest interval lasting 8 beats with a max rate of 184 bpm, the longest lasting 10 beats with an avg rate of 105 bpm. Supraventricular Tachycardia was detected within +/- 45 seconds of symptomatic patient event(s). Isolated SVEs were rare (<1.0%), SVE Couplets were rare (<1.0%), and SVE Triplets were rare (<1.0%). Isolated VEs were rare (<1.0%, 54), VE Couplets were rare (<1.0%, 4), and VE Triplets were rare (<1.0%, 2).   Conclusion: There is evidence of paroxysmal supraventricular tachycardia and nonsustained ventricular tachycardia.   CT SCANS  CT CORONARY MORPH W/CTA COR W/SCORE 12/09/2021  Addendum 12/09/2021 10:50 AM ADDENDUM REPORT: 12/09/2021 10:47  CLINICAL DATA:  52 yo female with chest pain  EXAM: Cardiac/Coronary CTA  TECHNIQUE: A non-contrast, gated CT scan was obtained with axial slices of 3 mm through the heart for calcium scoring. Calcium scoring was performed using the Agatston method. A 120 kV prospective, gated, contrast cardiac scan was obtained. Gantry rotation speed was 250 msecs and collimation was 0.6 mm. Two sublingual nitroglycerin tablets (0.8 mg) were given. The 3D data set was reconstructed in 5% intervals of the 35-75% of the R-R cycle. Diastolic phases were analyzed on a dedicated workstation using MPR, MIP, and VRT modes. The patient received 95 cc of contrast.  FINDINGS: Image quality: Average.  Noise artifact is: Moderate. (signal-to-noise).  Coronary Arteries:  Normal coronary origin.  Right dominance.  Left main: The left main is a large caliber vessel with a normal take off from the left coronary cusp that bifurcates to form a left anterior descending artery and a left circumflex artery. There is no plaque or stenosis.  Left anterior descending artery: The LAD  is patent  without evidence of plaque or stenosis. The LAD gives off 3 patent diagonal branches (D1 and D3 small and not well visualized).  Left circumflex artery: The LCX is non-dominant and patent with no evidence of plaque or stenosis (small distally). The LCX gives off 2 small patent obtuse marginal branches (not well visualized).  Right coronary artery: The RCA is dominant with normal take off from the right coronary cusp. There is minimal (0-24) calcified plaque in the distal vessel. The RCA terminates as a PDA and 3 right posterolateral branches without evidence of plaque or stenosis.  Right Atrium: Right atrial size is within normal limits.  Right Ventricle: The right ventricular cavity is within normal limits.  Left Atrium: Left atrial size is normal in size with no left atrial appendage filling defect.  Left Ventricle: The ventricular cavity size is within normal limits. There are no stigmata of prior infarction. There is no abnormal filling defect.  Pulmonary arteries: Normal in size without proximal filling defect.  Pulmonary veins: Normal pulmonary venous drainage.  Pericardium: Normal thickness with no significant effusion or calcium present.  Cardiac valves: The aortic valve is trileaflet without significant calcification. The mitral valve is normal structure without significant calcification.  Aorta: Normal caliber with no significant disease.  Extra-cardiac findings: See attached radiology report for non-cardiac structures.  IMPRESSION: 1. Coronary calcium score of 1.53. This was 24 percentile for age-, sex, and race-matched controls.  2. Normal coronary origin with right dominance.  3. Minimal calcified plaque in the distal RCA.  RECOMMENDATIONS: CAD-RADS 1: Minimal non-obstructive CAD (0-24%). Consider non-atherosclerotic causes of chest pain. Consider preventive therapy and risk factor modification.  Olga Millers, MD   Electronically Signed By:  Olga Millers M.D. On: 12/09/2021 10:47  Narrative EXAM: OVER-READ INTERPRETATION  CT CHEST  The following report is a limited chest CT over-read performed by radiologist Dr. Trudie Reed of [facility] on 12/09/2021. This over-read does not include interpretation of cardiac or coronary anatomy or pathology. The coronary calcium score and cardiac CTA interpretation by the cardiologist is attached.  COMPARISON:  None Available.  FINDINGS: Within the visualized portions of the thorax there are no suspicious appearing pulmonary nodules or masses, there is no acute consolidative airspace disease, no pleural effusions, no pneumothorax and no lymphadenopathy. Visualized portions of the upper abdomen are unremarkable. There are no aggressive appearing lytic or blastic lesions noted in the visualized portions of the skeleton.  IMPRESSION: 1. No significant incidental noncardiac findings are noted.  Electronically Signed: By: Trudie Reed M.D. On: 12/09/2021 09:14          Current Reported Medications:.    Current Meds  Medication Sig   estradiol (ESTRACE) 1 MG tablet Take 1 mg by mouth daily.   metFORMIN (GLUCOPHAGE) 500 MG tablet Take 1 tablet (500 mg total) by mouth daily with breakfast.   ondansetron (ZOFRAN-ODT) 8 MG disintegrating tablet Take 8 mg by mouth 2 (two) times daily as needed.   pantoprazole (PROTONIX) 40 MG tablet Take 1 tablet (40 mg total) by mouth daily.   potassium chloride SA (KLOR-CON M20) 20 MEQ tablet Take 1 tablet (20 mEq total) by mouth 2 (two) times daily. (Patient taking differently: Take 20 mEq by mouth once. Pt stated that she only takes 1 tab daily)   Semaglutide-Weight Management (WEGOVY) 0.5 MG/0.5ML SOAJ Inject 0.5 mg into the skin once a week.   SUMAtriptan (IMITREX) 50 MG tablet Take 1 tablet (50 mg total) by mouth  every 2 (two) hours as needed for migraine or headache. May repeat in 2 hours if headache persists or recurs.    triamterene-hydrochlorothiazide (MAXZIDE-25) 37.5-25 MG tablet TAKE 1 TABLET BY MOUTH EVERY DAY   [DISCONTINUED] propranolol (INDERAL) 20 MG tablet Take 1 tablet (20 mg total) by mouth 3 (three) times daily. *Must keep upcoming appt. in November 2024 with Cardiologist before anymore refills. Thank you, final attempt.*    Physical Exam:    VS:  BP 136/87 (BP Location: Right Arm, Patient Position: Sitting, Cuff Size: Large)   Pulse 70   Ht 5\' 7"  (1.702 m)   Wt 229 lb (103.9 kg)   LMP 03/03/2017 (Exact Date)   SpO2 96%   BMI 35.87 kg/m    Wt Readings from Last 3 Encounters:  05/28/23 229 lb (103.9 kg)  05/17/23 226 lb (102.5 kg)  04/13/23 230 lb (104.3 kg)    GEN: Well nourished, well developed in no acute distress NECK: No JVD; No carotid bruits CARDIAC: RRR, no murmurs, rubs, gallops RESPIRATORY:  Clear to auscultation without rales, wheezing or rhonchi  ABDOMEN: Soft, non-tender, non-distended EXTREMITIES:  No edema; No acute deformity   Asessement and Plan:.    Palpitations: Ms. Suares wore a cardiac monitor in 10/2021 which showed predominant sinus rhythm. She had 1 run of ventricular tachycardia lasting 4 beats with a max rate of 146 bpm she had 4 runs of supraventricular tachycardia, the run with the fastest interval lasting 8 beats with a max rate of 184 bpm, the lasted lasting 10 beats with an average rate of 105 bpm.  SVT was detected with in 45 seconds of patient triggered events. Today she reports increased heart rate with hot flashes but otherwise no palpitations.  Will continue propranolol 20 mg 3 times daily.  Refill sent to her pharmacy.  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 Stable with no anginal symptoms. No indication for ischemic evaluation.  Heart healthy diet and regular cardiovascular exercise encouraged.    Hypertension: Blood pressure today 136/87.  Discussed that her blood pressure goal is less than 130/80.  She will continue to monitor at home.   On triamterene-hydrochlorothiazide 37.5-25 mg daily.  Hyperlipidemia: Last lipid profile on 06/29/2022 indicated total cholesterol 203, triglycerides 125, HDL 61.4 and LDL 116.  Reviewed that given mild nonobstructive CAD that her LDL goal is less than 70.  She will have repeat labs in December with her PCP for annual physical she will reevaluate at that time.    Disposition: F/u with Dr. Servando Salina in one year.   Signed, Rip Harbour, NP

## 2023-05-28 ENCOUNTER — Ambulatory Visit: Payer: 59 | Attending: Nurse Practitioner | Admitting: Cardiology

## 2023-05-28 ENCOUNTER — Other Ambulatory Visit (HOSPITAL_BASED_OUTPATIENT_CLINIC_OR_DEPARTMENT_OTHER): Payer: Self-pay

## 2023-05-28 ENCOUNTER — Encounter: Payer: Self-pay | Admitting: Nurse Practitioner

## 2023-05-28 VITALS — BP 136/87 | HR 70 | Ht 67.0 in | Wt 229.0 lb

## 2023-05-28 DIAGNOSIS — E785 Hyperlipidemia, unspecified: Secondary | ICD-10-CM

## 2023-05-28 DIAGNOSIS — I2510 Atherosclerotic heart disease of native coronary artery without angina pectoris: Secondary | ICD-10-CM

## 2023-05-28 DIAGNOSIS — R002 Palpitations: Secondary | ICD-10-CM

## 2023-05-28 DIAGNOSIS — I4729 Other ventricular tachycardia: Secondary | ICD-10-CM | POA: Diagnosis not present

## 2023-05-28 DIAGNOSIS — I471 Supraventricular tachycardia, unspecified: Secondary | ICD-10-CM

## 2023-05-28 DIAGNOSIS — I10 Essential (primary) hypertension: Secondary | ICD-10-CM

## 2023-05-28 MED ORDER — PROPRANOLOL HCL 20 MG PO TABS: 20.0000 mg | ORAL_TABLET | Freq: Three times a day (TID) | ORAL | 3 refills | Status: DC

## 2023-05-28 NOTE — Patient Instructions (Signed)
Medication Instructions:  Your physician recommends that you continue on your current medications as directed. Please refer to the Current Medication list given to you today.  *If you need a refill on your cardiac medications before your next appointment, please call your pharmacy*   Lab Work: NONE ordered at this time of appointment     Testing/Procedures: NONE ordered at this time of appointment     Follow-Up: At [facility], you and your health needs are our priority.  As part of our continuing mission to provide you with exceptional heart care, we have created designated Provider Care Teams.  These Care Teams include your primary Cardiologist (physician) and Advanced Practice Providers (APPs -  Physician Assistants and Nurse Practitioners) who all work together to provide you with the care you need, when you need it.  We recommend signing up for the patient portal called "MyChart".  Sign up information is provided on this After Visit Summary.  MyChart is used to connect with patients for Virtual Visits (Telemedicine).  Patients are able to view lab/test results, encounter notes, upcoming appointments, etc.  Non-urgent messages can be sent to your provider as well.   To learn more about what you can do with MyChart, go to [email or web address].    Your next appointment:   1 year(s)  Provider:   Thomasene Ripple, DO     Other Instructions

## 2023-05-31 ENCOUNTER — Other Ambulatory Visit: Payer: Self-pay | Admitting: Bariatrics

## 2023-05-31 DIAGNOSIS — R632 Polyphagia: Secondary | ICD-10-CM

## 2023-06-17 ENCOUNTER — Encounter: Payer: Self-pay | Admitting: Bariatrics

## 2023-06-17 ENCOUNTER — Other Ambulatory Visit: Payer: Self-pay

## 2023-06-17 ENCOUNTER — Other Ambulatory Visit (HOSPITAL_BASED_OUTPATIENT_CLINIC_OR_DEPARTMENT_OTHER): Payer: Self-pay

## 2023-06-17 ENCOUNTER — Ambulatory Visit: Payer: 59 | Admitting: Bariatrics

## 2023-06-17 VITALS — BP 131/84 | HR 70 | Temp 97.7°F | Ht 67.0 in | Wt 222.0 lb

## 2023-06-17 DIAGNOSIS — E669 Obesity, unspecified: Secondary | ICD-10-CM

## 2023-06-17 DIAGNOSIS — R632 Polyphagia: Secondary | ICD-10-CM

## 2023-06-17 DIAGNOSIS — E66811 Obesity, class 1: Secondary | ICD-10-CM

## 2023-06-17 DIAGNOSIS — Z6834 Body mass index (BMI) 34.0-34.9, adult: Secondary | ICD-10-CM

## 2023-06-17 DIAGNOSIS — E782 Mixed hyperlipidemia: Secondary | ICD-10-CM

## 2023-06-17 MED ORDER — METFORMIN HCL 500 MG PO TABS
500.0000 mg | ORAL_TABLET | Freq: Two times a day (BID) | ORAL | 0 refills | Status: DC
  Filled 2023-06-17 (×2): qty 60, 30d supply, fill #0

## 2023-06-17 NOTE — Progress Notes (Signed)
WEIGHT SUMMARY AND BIOMETRICS  Weight Lost Since Last Visit: 4 lb  Weight Gained Since Last Visit: 0   Vitals Temp: 97.7 F (36.5 C) BP: 131/84 Pulse Rate: 70 SpO2: 99 %   Anthropometric Measurements Height: 5\' 7"  (1.702 m) Weight: 222 lb (100.7 kg) BMI (Calculated): 34.76 Weight at Last Visit: 226 lb Weight Lost Since Last Visit: 4 lb Weight Gained Since Last Visit: 0 Starting Weight: 224 lb Total Weight Loss (lbs): 2 lb (0.907 kg)   Body Composition  Body Fat %: 41.8 % Fat Mass (lbs): 93 lbs Muscle Mass (lbs): 122.8 lbs Total Body Water (lbs): 87.4 lbs Visceral Fat Rating : 11   Other Clinical Data Today's Visit #: 15 Starting Date: 03/11/22    OBESITY Karise is here to discuss her progress with her obesity treatment plan along with follow-up of her obesity related diagnoses.    Nutrition Plan: the Category 2 plan - 75% adherence.  Current exercise: walking for 30 minutes 1-2 times per week, yoga for 60 minutes 1 time per week  Interim History:  She is down 2 lbs since her last visit. She is doing well overall, but sometimes gets hungry right before her next injection of Wegovy. She is also taking Metformin.  Eating all of the food on the plan., Protein intake is as prescribed, Is not skipping meals, Not meeting protein goals., and Water intake is adequate.   Pharmacotherapy: Wilba is on Wegovy 0.50 mg SQ weekly and Metformin 500 mg twice daily with meals Adverse side effects: None Hunger is moderately controlled.  Cravings are moderately controlled.  Assessment/Plan:       Generalized Obesity: Current BMI BMI (Calculated): 34.76   Pharmacotherapy Plan Continue  Metformin 500 mg twice daily with meals. She is also taking Wegovy.   Jaki is currently in the action stage of change. As such, her goal is to continue with weight  loss efforts.  She has agreed to the Category 2 plan.  Exercise goals: All adults should avoid inactivity. Some physical activity is better than none, and adults who participate in any amount of physical activity gain some health benefits.  Behavioral modification strategies: increasing lean protein intake, no meal skipping, decrease eating out, meal planning , planning for success, keep healthy foods in the home, and mindful eating.  Erikka has agreed to follow-up with our clinic in 4 weeks.      Objective:   VITALS: Per patient if applicable, see vitals. GENERAL: Alert and in no acute distress. CARDIOPULMONARY: No increased WOB. Speaking in clear sentences.  PSYCH: Pleasant and cooperative. Speech normal rate and rhythm. Affect is appropriate. Insight and judgement are appropriate. Attention is focused, linear, and appropriate.  NEURO: Oriented as arrived to appointment on time with no prompting.   Attestation Statements:   This was prepared with the assistance of Engineer, civil (consulting).  Occasional wrong-word or sound-a-like substitutions may have occurred due to the inherent limitations of voice recognition   Corinna Capra, DO

## 2023-06-18 ENCOUNTER — Other Ambulatory Visit (HOSPITAL_BASED_OUTPATIENT_CLINIC_OR_DEPARTMENT_OTHER): Payer: Self-pay

## 2023-06-23 ENCOUNTER — Other Ambulatory Visit (HOSPITAL_BASED_OUTPATIENT_CLINIC_OR_DEPARTMENT_OTHER): Payer: Self-pay

## 2023-07-01 ENCOUNTER — Encounter: Payer: 59 | Admitting: Family Medicine

## 2023-07-07 NOTE — Progress Notes (Unsigned)
Complete physical exam   Patient: Connie Blanchard   DOB: 1971/01/21   52 y.o. Female  MRN: [id number] Visit Date: 07/08/2023  Today's healthcare provider: Alfredia Ferguson, PA-C   No chief complaint on file.  Subjective    Connie Blanchard is a 52 y.o. female who presents today for a complete physical exam.   ***  Past Medical History:  Diagnosis Date   Acute bronchitis 06/24/2012   Acute bronchitis with asthma 06/24/2012   Allergic state 10/15/2013   Anemia 08/25/2011   Anxiety    Anxiety    not current   At risk for heart disease 04/01/2020   Back pain 10/15/2013   Back pain    Cervical disc disease 04/22/2015   Chronic sinusitis 02/2012   current runny nose of clear drainage   Colon cancer screening 07/29/2011   Sees Dr Dwain Sarna of dermatology in [facility] Dr Henderson Cloud of GYN for paps and [facility] and denies any concerns   Constipation    Dental crowns present    Depression    Depression with anxiety    in the past    Essential hypertension 10/06/2015   Gallbladder polyp 08/06/2020   GERD (gastroesophageal reflux disease)    H/O gestational diabetes mellitus, not currently pregnant 07/29/2011   h/o    Headache    history of migraines   History of blood transfusion    History of blood transfusion    History of gestational diabetes mellitus (GDM) 03/26/2020   History of hiatal hernia    Hyperglycemia 05/25/2017   Hyperlipidemia, mixed 10/06/2015   Hypertension    Insomnia    Insulin resistance 06/29/2018   Iron deficiency anemia    Joint pain    Laryngopharyngeal reflux (LPR) 02/2012   current runny nose of clear drainage   Left lower quadrant pain 08/06/2020   Left shoulder pain 05/26/2017   Leg edema    Nausea 10/15/2013   Obese    Obesity    Other fatigue 10/12/2017   Palpitation    PCOS (polycystic ovarian syndrome)    Pedal edema 10/06/2015   Periumbilical pain 08/06/2020   Pharyngitis 05/07/2019   Postoperative state 03/17/2017    Right shoulder pain 05/25/2017   Right upper quadrant pain 08/06/2020   Shortness of breath on exertion 10/12/2017   Vitamin D deficiency 03/26/2020   Past Surgical History:  Procedure Laterality Date   ABDOMINAL HYSTERECTOMY  02/2017   anemia, fibroids   CHOLECYSTECTOMY N/A 05/01/2021   Procedure: LAPAROSCOPIC CHOLECYSTECTOMY;  Surgeon: Abigail Miyamoto, MD;  [facility]: [facility];  Service: General;  Laterality: N/A;   COLONOSCOPY     DILATION AND CURETTAGE OF UTERUS  1987   NASAL SEPTOPLASTY W/ TURBINOPLASTY  03/23/2012   Procedure: NASAL SEPTOPLASTY WITH TURBINATE REDUCTION;  Surgeon: Osborn Coho, MD;  Location: [facility];  Service: ENT;  Laterality: Bilateral;   ROBOTIC ASSISTED TOTAL HYSTERECTOMY WITH SALPINGECTOMY N/A 03/17/2017   Procedure: ROBOTIC ASSISTED TOTAL HYSTERECTOMY WITH SALPINGECTOMY;  Surgeon: Carrington Clamp, MD;  Location: [facility];  Service: Gynecology;  Laterality: N/A;   SINUS ENDO W/FUSION  03/23/2012   Procedure: ENDOSCOPIC SINUS SURGERY WITH FUSION NAVIGATION;  Surgeon: Osborn Coho, MD;  Location:  SURGERY CENTER;  Service: ENT;  Laterality: Bilateral;  with fusion scan   UPPER GI ENDOSCOPY     WISDOM TOOTH EXTRACTION     Social History   Socioeconomic History   Marital status: Married    Spouse name: Tinnie Gens  Number of children: 4   Years of education: Not on file   Highest education level: Not on file  Occupational History   Occupation: RN  Tobacco Use   Smoking status: Former    Current packs/day: 0.00    Average packs/day: 1 pack/day for 19.0 years (19.0 ttl pk-yrs)    Types: Cigarettes    Start date: 07/27/1984    Quit date: 07/28/2003    Years since quitting: 19.9   Smokeless tobacco: Never  Substance and Sexual Activity   Alcohol use: Not Currently    Comment: occasionally   Drug use: Yes    Comment: CBD oil - last week 04/29/21   Sexual activity: Yes    Partners: Male    Birth control/protection: None     Comment: lives with husband, stepson 31, son and daughter. no dietary restrictions. eating heart healthy, walking more  Other Topics Concern   Not on file  Social History Narrative   Not on file   Social Determinants of Health   Financial Resource Strain: Not on file  Food Insecurity: Not on file  Transportation Needs: Not on file  Physical Activity: Not on file  Stress: Not on file  Social Connections: Unknown (12/05/2021)   Received from [facility]   Social Network    Social Network: Not on file  Intimate Partner Violence: Unknown (10/28/2021)   Received from [employer], [facility]   HITS    Physically Hurt: Not on file    Insult or Talk Down To: Not on file    Threaten Physical Harm: Not on file    Scream or Curse: Not on file   Family Status  Relation Name Status   Mother  Deceased at age 57   Father  Other       unknown history, was absentee   MGM 72 Alive       37 healthy   MGF  Deceased at age 84   PGM  Other   PGF  Other   Daughter Grace,10 Alive       3.5/ healthy   Son Evan,13 Alive       6.5/ healthy   Mat Aunt  Deceased  No partnership data on file   Family History  Problem Relation Age of Onset   Diabetes Mother        type 2   Hypertension Mother    Depression Mother    Mental illness Mother        bi-polar   Pulmonary embolism Mother    Obesity Mother    Sudden death Mother    Other Father        drug addiction   Heart disease Maternal Grandfather    Stroke Maternal Grandfather    Depression Maternal Aunt 3       suicide attempt with pneumonia   Allergies  Allergen Reactions   Sulfa Antibiotics Rash   Penicillins Rash    Has patient had a PCN reaction causing immediate rash, facial/tongue/throat swelling, SOB or lightheadedness with hypotension: Unknown Has patient had a PCN reaction causing severe rash involving mucus membranes or skin necrosis: Unknown Has patient had a PCN reaction that required hospitalization:  No Has patient had a PCN reaction occurring within the last 10 years: No Childhood allergy  If all of the above answers are "NO", then may proceed with Cephalosporin use.     Patient Care Team: Bradd Canary, MD as PCP - General (Family Medicine) Tobb,  Lavona Mound, DO as PCP - Cardiology (Cardiology)   Medications: Outpatient Medications Prior to Visit  Medication Sig   estradiol (ESTRACE) 1 MG tablet Take 1 mg by mouth daily.   metFORMIN (GLUCOPHAGE) 500 MG tablet Take 1 tablet (500 mg total) by mouth 2 (two) times daily with a meal.   ondansetron (ZOFRAN-ODT) 8 MG disintegrating tablet Take 8 mg by mouth 2 (two) times daily as needed.   pantoprazole (PROTONIX) 40 MG tablet Take 1 tablet (40 mg total) by mouth daily.   potassium chloride SA (KLOR-CON M20) 20 MEQ tablet Take 1 tablet (20 mEq total) by mouth 2 (two) times daily. (Patient taking differently: Take 20 mEq by mouth once. Pt stated that she only takes 1 tab daily)   propranolol (INDERAL) 20 MG tablet Take 1 tablet (20 mg total) by mouth 3 (three) times daily. *Must keep upcoming appt. in November 2024 with Cardiologist before anymore refills. Thank you, final attempt.*   propranolol (INDERAL) 20 MG tablet Take 1 tablet (20 mg total) by mouth 3 (three) times daily. *Must keep upcoming appt. in November 2024 with Cardiologist before anymore refills. Thank you, final attempt.*   Semaglutide-Weight Management (WEGOVY) 0.5 MG/0.5ML SOAJ Inject 0.5 mg into the skin once a week.   SUMAtriptan (IMITREX) 50 MG tablet Take 1 tablet (50 mg total) by mouth every 2 (two) hours as needed for migraine or headache. May repeat in 2 hours if headache persists or recurs.   triamterene-hydrochlorothiazide (MAXZIDE-25) 37.5-25 MG tablet TAKE 1 TABLET BY MOUTH EVERY DAY   No facility-administered medications prior to visit.    Review of Systems {Insert previous labs (optional):23779} {See past labs  Heme  Chem  Endocrine  Serology  Results Review  (optional):1}  Objective    LMP 03/03/2017 (Exact Date)  {Insert last BP/Wt (optional):23777}{See vitals history (optional):1}  Physical Exam  ***  Last depression screening scores    06/29/2022    8:32 AM 03/11/2022    7:51 AM 03/26/2020   10:16 AM  PHQ 2/9 Scores  PHQ - 2 Score 0 6 2  PHQ- 9 Score 0 19 3   Last fall risk screening    06/29/2022    8:32 AM  Fall Risk   Falls in the past year? 0  Number falls in past yr: 0  Injury with Fall? 0  Follow up Falls evaluation completed   Last Audit-C alcohol use screening     No data to display         A score of 3 or more in women, and 4 or more in men indicates increased risk for alcohol abuse, EXCEPT if all of the points are from question 1   No results found for any visits on 07/08/23.  Assessment & Plan    Routine Health Maintenance and Physical Exam  Exercise Activities and Dietary recommendations  Goals   None    --balanced diet high in fiber and protein, low in sugars, carbs, fats. --physical activity/exercise 20-30 minutes 3-5 times a week    Immunization History  Administered Date(s) Administered   Influenza Split 06/21/2012   Influenza Whole 05/28/2011   Influenza,inj,Quad PF,6+ Mos 04/17/2014, 05/31/2018, 04/07/2019, 06/29/2022   Influenza-Unspecified 05/31/2018, 04/07/2019   Novel Infuenza-h1n1-09 07/02/2008   PFIZER(Purple Top)SARS-COV-2 Vaccination 10/27/2019, 11/19/2019   Tdap 07/29/2011, 06/29/2022    Health Maintenance  Topic Date Due   Hepatitis C Screening  Never done   Zoster Vaccines- Shingrix (1 of 2) Never done   INFLUENZA VACCINE  02/25/2023   COVID-19 Vaccine (5 - 2023-24 season) 03/28/2023   MAMMOGRAM  08/09/2023   Colonoscopy  08/07/2026   DTaP/Tdap/Td (3 - Td or Tdap) 06/29/2032   HIV Screening  Completed   HPV VACCINES  Aged Out    Discussed health benefits of physical activity, and encouraged her to engage in regular exercise appropriate for her age and  condition.  ***  No follow-ups on file.     Alfredia Ferguson, PA-C  [facility] at [facility] [phone] (phone) [phone] (fax)  [facility]

## 2023-07-08 ENCOUNTER — Encounter: Payer: 59 | Admitting: Physician Assistant

## 2023-07-15 ENCOUNTER — Other Ambulatory Visit (HOSPITAL_BASED_OUTPATIENT_CLINIC_OR_DEPARTMENT_OTHER): Payer: Self-pay

## 2023-07-15 ENCOUNTER — Encounter: Payer: Self-pay | Admitting: Bariatrics

## 2023-07-15 ENCOUNTER — Ambulatory Visit: Payer: 59 | Admitting: Bariatrics

## 2023-07-15 DIAGNOSIS — R632 Polyphagia: Secondary | ICD-10-CM

## 2023-07-15 DIAGNOSIS — I10 Essential (primary) hypertension: Secondary | ICD-10-CM

## 2023-07-15 DIAGNOSIS — Z6834 Body mass index (BMI) 34.0-34.9, adult: Secondary | ICD-10-CM | POA: Diagnosis not present

## 2023-07-15 DIAGNOSIS — E669 Obesity, unspecified: Secondary | ICD-10-CM

## 2023-07-15 MED ORDER — METFORMIN HCL 500 MG PO TABS
500.0000 mg | ORAL_TABLET | Freq: Two times a day (BID) | ORAL | 0 refills | Status: DC
  Filled 2023-07-15: qty 60, 30d supply, fill #0

## 2023-07-15 NOTE — Progress Notes (Signed)
WEIGHT SUMMARY AND BIOMETRICS  Weight Lost Since Last Visit: 4lb  Weight Gained Since Last Visit: 0   Vitals Temp: 97.7 F (36.5 C) BP: 118/76 Pulse Rate: 67 SpO2: 98 %   Anthropometric Measurements Height: 5\' 7"  (1.702 m) Weight: 218 lb (98.9 kg) BMI (Calculated): 34.14 Weight at Last Visit: 222lb Weight Lost Since Last Visit: 4lb Weight Gained Since Last Visit: 0 Starting Weight: 224lb Total Weight Loss (lbs): 6 lb (2.722 kg)   Body Composition  Body Fat %: 39.7 % Fat Mass (lbs): 86.8 lbs Muscle Mass (lbs): 125 lbs Total Body Water (lbs): 86.8 lbs Visceral Fat Rating : 11   Other Clinical Data Fasting: no Labs: no Today's Visit #: 16 Starting Date: 03/11/22    OBESITY Connie Blanchard is here to discuss her progress with her obesity treatment plan along with follow-up of her obesity related diagnoses.    Nutrition Plan: the Category 2 plan - 75% adherence.  Current exercise: walking and yoga  Interim History:  She is down another 4 lbs since her last visit. She had been sick in the interim.  Eating all of the food on the plan., Protein intake is as prescribed, Is skipping meals, Water intake is adequate., and Denies polyphagia   Pharmacotherapy: Connie Blanchard is on Wegovy 0.50 mg SQ weekly Adverse side effects: None Hunger is moderately controlled.  Cravings are moderately controlled.  Assessment/Plan:   1. Polyphagia Polyphagia Connie Blanchard endorses excessive hunger.  Medication(s): ONGEXB and Metformin  Effects of medication:  moderately controlled. Cravings are moderately controlled.   Plan: Medication(s): Wegovy 0.50 mg SQ weekly Will consider increasing her Wegovy dose at the next visit.  Will increase water, protein and fiber to help assuage hunger.  Will minimize foods that have a high glucose index/load to minimize reactive hypoglycemia.    2. Essential hypertension Hypertension Hypertension stable.  Medication(s): Maxzide 37.5/25 mg daily   BP Readings from Last 3 Encounters:  07/15/23 118/76  06/17/23 131/84  05/28/23 136/87   Lab Results  Component Value Date   CREATININE 0.90 06/29/2022   CREATININE 0.83 03/11/2022   CREATININE 1.08 12/25/2021   Lab Results  Component Value Date   GFR 72.91 06/29/2022   GFR 58.80 (L) 12/25/2021   GFR 72.02 07/01/2020    Plan: Continue all antihypertensives at current dosages. No added salt. Will keep sodium content to 1,500 mg or less per day.       Generalized Obesity: Current BMI BMI (Calculated): 34.14   Pharmacotherapy Plan Continue and refill  Metformin 500 mg twice daily with meals  Connie Blanchard is currently in the action stage of change. As such, her goal is to continue with weight loss efforts.  She has agreed to the Category 2 plan.  Exercise goals: For substantial health benefits, adults should do at least 150 minutes (2 hours and 30 minutes) a week of moderate-intensity,  or 75 minutes (1 hour and 15 minutes) a week of vigorous-intensity aerobic physical activity, or an equivalent combination of moderate- and vigorous-intensity aerobic activity. Aerobic activity should be performed in episodes of at least 10 minutes, and preferably, it should be spread throughout the week. She will be walking and Yoga.   Behavioral modification strategies: increasing lean protein intake, decreasing simple carbohydrates , increase water intake, better snacking choices, increasing vegetables, avoiding temptations, and mindful eating.  Connie Blanchard has agreed to follow-up with our clinic in 4 weeks.      Objective:   VITALS: Per patient if applicable, see vitals. GENERAL: Alert and in no acute distress. CARDIOPULMONARY: No increased WOB. Speaking in clear sentences.  PSYCH: Pleasant and cooperative. Speech normal rate and rhythm. Affect is appropriate. Insight and judgement are  appropriate. Attention is focused, linear, and appropriate.  NEURO: Oriented as arrived to appointment on time with no prompting.   Attestation Statements:   This was prepared with the assistance of Engineer, civil (consulting).  Occasional wrong-word or sound-a-like substitutions may have occurred due to the inherent limitations of voice recognition   Corinna Capra, DO

## 2023-07-15 NOTE — Progress Notes (Deleted)
Complete physical exam   Patient: Connie Blanchard   DOB: 07/02/1971   52 y.o. Female  MRN: [id number] Visit Date: 07/16/2023  Today's healthcare provider: Alfredia Ferguson, PA-C   No chief complaint on file.  Subjective    Connie Blanchard is a 52 y.o. female who presents today for a complete physical exam.   ***  Past Medical History:  Diagnosis Date   Acute bronchitis 06/24/2012   Acute bronchitis with asthma 06/24/2012   Allergic state 10/15/2013   Anemia 08/25/2011   Anxiety    Anxiety    not current   At risk for heart disease 04/01/2020   Back pain 10/15/2013   Back pain    Cervical disc disease 04/22/2015   Chronic sinusitis 02/2012   current runny nose of clear drainage   Colon cancer screening 07/29/2011   Sees Dr Dwain Sarna of dermatology in [facility] Dr Henderson Cloud of GYN for paps and [facility] and denies any concerns   Constipation    Dental crowns present    Depression    Depression with anxiety    in the past    Essential hypertension 10/06/2015   Gallbladder polyp 08/06/2020   GERD (gastroesophageal reflux disease)    H/O gestational diabetes mellitus, not currently pregnant 07/29/2011   h/o    Headache    history of migraines   History of blood transfusion    History of blood transfusion    History of gestational diabetes mellitus (GDM) 03/26/2020   History of hiatal hernia    Hyperglycemia 05/25/2017   Hyperlipidemia, mixed 10/06/2015   Hypertension    Insomnia    Insulin resistance 06/29/2018   Iron deficiency anemia    Joint pain    Laryngopharyngeal reflux (LPR) 02/2012   current runny nose of clear drainage   Left lower quadrant pain 08/06/2020   Left shoulder pain 05/26/2017   Leg edema    Nausea 10/15/2013   Obese    Obesity    Other fatigue 10/12/2017   Palpitation    PCOS (polycystic ovarian syndrome)    Pedal edema 10/06/2015   Periumbilical pain 08/06/2020   Pharyngitis 05/07/2019   Postoperative state 03/17/2017    Right shoulder pain 05/25/2017   Right upper quadrant pain 08/06/2020   Shortness of breath on exertion 10/12/2017   Vitamin D deficiency 03/26/2020   Past Surgical History:  Procedure Laterality Date   ABDOMINAL HYSTERECTOMY  02/2017   anemia, fibroids   CHOLECYSTECTOMY N/A 05/01/2021   Procedure: LAPAROSCOPIC CHOLECYSTECTOMY;  Surgeon: Abigail Miyamoto, MD;  Location: [facility];  Service: General;  Laterality: N/A;   COLONOSCOPY     DILATION AND CURETTAGE OF UTERUS  1987   NASAL SEPTOPLASTY W/ TURBINOPLASTY  03/23/2012   Procedure: NASAL SEPTOPLASTY WITH TURBINATE REDUCTION;  Surgeon: Osborn Coho, MD;  Location: [facility];  Service: ENT;  Laterality: Bilateral;   ROBOTIC ASSISTED TOTAL HYSTERECTOMY WITH SALPINGECTOMY N/A 03/17/2017   Procedure: ROBOTIC ASSISTED TOTAL HYSTERECTOMY WITH SALPINGECTOMY;  Surgeon: Carrington Clamp, MD;  Location: [facility];  Service: Gynecology;  Laterality: N/A;   SINUS ENDO W/FUSION  03/23/2012   Procedure: ENDOSCOPIC SINUS SURGERY WITH FUSION NAVIGATION;  Surgeon: Osborn Coho, MD;  Location: [facility];  Service: ENT;  Laterality: Bilateral;  with fusion scan   UPPER GI ENDOSCOPY     WISDOM TOOTH EXTRACTION     Social History   Socioeconomic History   Marital status: Married    Spouse name: Connie Blanchard  Number of children: 4   Years of education: Not on file   Highest education level: Not on file  Occupational History   Occupation: RN  Tobacco Use   Smoking status: Former    Current packs/day: 0.00    Average packs/day: 1 pack/day for 19.0 years (19.0 ttl pk-yrs)    Types: Cigarettes    Start date: 07/27/1984    Quit date: 07/28/2003    Years since quitting: 19.9   Smokeless tobacco: Never  Substance and Sexual Activity   Alcohol use: Not Currently    Comment: occasionally   Drug use: Yes    Comment: CBD oil - last week 04/29/21   Sexual activity: Yes    Partners: Male    Birth control/protection: None     Comment: lives with husband, stepson 43, son and daughter. no dietary restrictions. eating heart healthy, walking more  Other Topics Concern   Not on file  Social History Narrative   Not on file   Social Drivers of Health   Financial Resource Strain: Not on file  Food Insecurity: Not on file  Transportation Needs: Not on file  Physical Activity: Not on file  Stress: Not on file  Social Connections: Unknown (12/05/2021)   Received from [facility]   Social Network    Social Network: Not on file  Intimate Partner Violence: Unknown (10/28/2021)   Received from [employer], [facility]   HITS    Physically Hurt: Not on file    Insult or Talk Down To: Not on file    Threaten Physical Harm: Not on file    Scream or Curse: Not on file   Family Status  Relation Name Status   Mother  Deceased at age 80   Father  Other       unknown history, was absentee   MGM 3 Alive       69 healthy   MGF  Deceased at age 30   PGM  Other   PGF  Other   Daughter Connie Blanchard,10 Alive       3.5/ healthy   Son Connie Blanchard,13 Alive       6.5/ healthy   Mat Aunt  Deceased  No partnership data on file   Family History  Problem Relation Age of Onset   Diabetes Mother        type 2   Hypertension Mother    Depression Mother    Mental illness Mother        bi-polar   Pulmonary embolism Mother    Obesity Mother    Sudden death Mother    Other Father        drug addiction   Heart disease Maternal Grandfather    Stroke Maternal Grandfather    Depression Maternal Aunt 36       suicide attempt with pneumonia   Allergies  Allergen Reactions   Sulfa Antibiotics Rash   Penicillins Rash    Has patient had a PCN reaction causing immediate rash, facial/tongue/throat swelling, SOB or lightheadedness with hypotension: Unknown Has patient had a PCN reaction causing severe rash involving mucus membranes or skin necrosis: Unknown Has patient had a PCN reaction that required hospitalization:  No Has patient had a PCN reaction occurring within the last 10 years: No Childhood allergy  If all of the above answers are "NO", then may proceed with Cephalosporin use.     Patient Care Team: Bradd Canary, MD as PCP - General (Family Medicine) Tobb,  Lavona Mound, DO as PCP - Cardiology (Cardiology)   Medications: Outpatient Medications Prior to Visit  Medication Sig   estradiol (ESTRACE) 1 MG tablet Take 1 mg by mouth daily.   metFORMIN (GLUCOPHAGE) 500 MG tablet Take 1 tablet (500 mg total) by mouth 2 (two) times daily with a meal.   ondansetron (ZOFRAN-ODT) 8 MG disintegrating tablet Take 8 mg by mouth 2 (two) times daily as needed.   pantoprazole (PROTONIX) 40 MG tablet Take 1 tablet (40 mg total) by mouth daily.   potassium chloride SA (KLOR-CON M20) 20 MEQ tablet Take 1 tablet (20 mEq total) by mouth 2 (two) times daily. (Patient taking differently: Take 20 mEq by mouth once. Pt stated that she only takes 1 tab daily)   propranolol (INDERAL) 20 MG tablet Take 1 tablet (20 mg total) by mouth 3 (three) times daily. *Must keep upcoming appt. in November 2024 with Cardiologist before anymore refills. Thank you, final attempt.*   propranolol (INDERAL) 20 MG tablet Take 1 tablet (20 mg total) by mouth 3 (three) times daily. *Must keep upcoming appt. in November 2024 with Cardiologist before anymore refills. Thank you, final attempt.*   Semaglutide-Weight Management (WEGOVY) 0.5 MG/0.5ML SOAJ Inject 0.5 mg into the skin once a week.   SUMAtriptan (IMITREX) 50 MG tablet Take 1 tablet (50 mg total) by mouth every 2 (two) hours as needed for migraine or headache. May repeat in 2 hours if headache persists or recurs.   triamterene-hydrochlorothiazide (MAXZIDE-25) 37.5-25 MG tablet TAKE 1 TABLET BY MOUTH EVERY DAY   No facility-administered medications prior to visit.    Review of Systems {Insert previous labs (optional):23779} {See past labs  Heme  Chem  Endocrine  Serology  Results Review  (optional):1}  Objective    LMP 03/03/2017 (Exact Date)  {Insert last BP/Wt (optional):23777}{See vitals history (optional):1}  Physical Exam  ***  Last depression screening scores    06/29/2022    8:32 AM 03/11/2022    7:51 AM 03/26/2020   10:16 AM  PHQ 2/9 Scores  PHQ - 2 Score 0 6 2  PHQ- 9 Score 0 19 3   Last fall risk screening    06/29/2022    8:32 AM  Fall Risk   Falls in the past year? 0  Number falls in past yr: 0  Injury with Fall? 0  Follow up Falls evaluation completed   Last Audit-C alcohol use screening     No data to display         A score of 3 or more in women, and 4 or more in men indicates increased risk for alcohol abuse, EXCEPT if all of the points are from question 1   No results found for any visits on 07/16/23.  Assessment & Plan    Routine Health Maintenance and Physical Exam  Exercise Activities and Dietary recommendations  Goals   None    --balanced diet high in fiber and protein, low in sugars, carbs, fats. --physical activity/exercise 20-30 minutes 3-5 times a week    Immunization History  Administered Date(s) Administered   Influenza Split 06/21/2012   Influenza Whole 05/28/2011   Influenza,inj,Quad PF,6+ Mos 04/17/2014, 05/31/2018, 04/07/2019, 06/29/2022   Influenza-Unspecified 05/31/2018, 04/07/2019   Novel Infuenza-h1n1-09 07/02/2008   PFIZER(Purple Top)SARS-COV-2 Vaccination 10/27/2019, 11/19/2019   Tdap 07/29/2011, 06/29/2022    Health Maintenance  Topic Date Due   Hepatitis C Screening  Never done   Zoster Vaccines- Shingrix (1 of 2) Never done   INFLUENZA VACCINE  02/25/2023   COVID-19 Vaccine (5 - 2024-25 season) 03/28/2023   MAMMOGRAM  08/09/2023   Colonoscopy  08/07/2026   DTaP/Tdap/Td (3 - Td or Tdap) 06/29/2032   HIV Screening  Completed   HPV VACCINES  Aged Out    Discussed health benefits of physical activity, and encouraged her to engage in regular exercise appropriate for her age and  condition.  ***  No follow-ups on file.     Alfredia Ferguson, PA-C  [facility] at [facility] [phone] (phone) [phone] (fax)  [facility]

## 2023-07-16 ENCOUNTER — Encounter: Payer: 59 | Admitting: Physician Assistant

## 2023-07-16 DIAGNOSIS — I10 Essential (primary) hypertension: Secondary | ICD-10-CM

## 2023-07-16 DIAGNOSIS — E782 Mixed hyperlipidemia: Secondary | ICD-10-CM

## 2023-07-26 ENCOUNTER — Other Ambulatory Visit (HOSPITAL_BASED_OUTPATIENT_CLINIC_OR_DEPARTMENT_OTHER): Payer: Self-pay

## 2023-07-30 ENCOUNTER — Encounter: Payer: Self-pay | Admitting: Physician Assistant

## 2023-07-30 ENCOUNTER — Ambulatory Visit: Payer: 59 | Admitting: Physician Assistant

## 2023-07-30 ENCOUNTER — Other Ambulatory Visit (HOSPITAL_BASED_OUTPATIENT_CLINIC_OR_DEPARTMENT_OTHER): Payer: Self-pay

## 2023-07-30 VITALS — BP 144/90 | HR 82 | Temp 97.9°F | Ht 67.0 in | Wt 220.4 lb

## 2023-07-30 DIAGNOSIS — H6503 Acute serous otitis media, bilateral: Secondary | ICD-10-CM

## 2023-07-30 DIAGNOSIS — J0100 Acute maxillary sinusitis, unspecified: Secondary | ICD-10-CM

## 2023-07-30 MED ORDER — PREDNISONE 20 MG PO TABS
20.0000 mg | ORAL_TABLET | Freq: Every day | ORAL | 0 refills | Status: AC
  Filled 2023-07-30: qty 5, 5d supply, fill #0

## 2023-07-30 MED ORDER — AZELASTINE HCL 0.1 % NA SOLN
1.0000 | Freq: Two times a day (BID) | NASAL | 1 refills | Status: DC
  Filled 2023-07-30: qty 30, 100d supply, fill #0

## 2023-07-30 NOTE — Progress Notes (Signed)
 Established patient visit   Patient: Connie Blanchard   DOB: [birth year]   53 y.o. Female  MRN: [id number] Visit Date: 07/30/2023  Today's healthcare provider: Manuelita Flatness, PA-C   Cc. Tinnitus, ear fullness, dizziness  Subjective    Pt reports having a cold before thanksgiving, and since then nasal congestion, ear fullness, a 'swimmy headed' sensation, and ringing in her ears has persisted.  She was put on flonase  from an urgent care, which has not made a difference.   Reports mild headaches.  Medications: Outpatient Medications Prior to Visit  Medication Sig   Dexlansoprazole (DEXILANT) 30 MG capsule DR Take 30 mg by mouth daily.   estradiol  (ESTRACE ) 1 MG tablet Take 1 mg by mouth daily.   metFORMIN  (GLUCOPHAGE ) 500 MG tablet Take 1 tablet (500 mg total) by mouth 2 (two) times daily with a meal.   ondansetron  (ZOFRAN -ODT) 8 MG disintegrating tablet Take 8 mg by mouth 2 (two) times daily as needed.   potassium chloride  SA (KLOR-CON  M20) 20 MEQ tablet Take 1 tablet (20 mEq total) by mouth 2 (two) times daily. (Patient taking differently: Take 20 mEq by mouth once. Pt stated that she only takes 1 tab daily)   propranolol  (INDERAL ) 20 MG tablet Take 1 tablet (20 mg total) by mouth 3 (three) times daily. *Must keep upcoming appt. in November 2024 with Cardiologist before anymore refills. Thank you, final attempt.*   propranolol  (INDERAL ) 20 MG tablet Take 1 tablet (20 mg total) by mouth 3 (three) times daily. *Must keep upcoming appt. in November 2024 with Cardiologist before anymore refills. Thank you, final attempt.*   Semaglutide -Weight Management (WEGOVY ) 0.5 MG/0.5ML SOAJ Inject 0.5 mg into the skin once a week.   SUMAtriptan  (IMITREX ) 50 MG tablet Take 1 tablet (50 mg total) by mouth every 2 (two) hours as needed for migraine or headache. May repeat in 2 hours if headache persists or recurs.   triamterene -hydrochlorothiazide  (MAXZIDE -25) 37.5-25 MG tablet TAKE 1 TABLET BY  MOUTH EVERY DAY   [DISCONTINUED] pantoprazole  (PROTONIX ) 40 MG tablet Take 1 tablet (40 mg total) by mouth daily.   No facility-administered medications prior to visit.    Review of Systems  Constitutional:  Negative for fatigue and fever.  HENT:  Positive for congestion, ear pain and tinnitus.   Respiratory:  Negative for cough and shortness of breath.   Cardiovascular:  Negative for chest pain and leg swelling.  Gastrointestinal:  Negative for abdominal pain.  Neurological:  Positive for dizziness and light-headedness. Negative for headaches.       Objective    BP (!) 144/90   Pulse 82   Temp 97.9 F (36.6 C) (Oral)   Ht 5' 7 (1.702 m)   Wt 220 lb 6 oz (100 kg)   LMP 03/03/2017 (Exact Date)   SpO2 97%   BMI 34.52 kg/m    Physical Exam Vitals reviewed.  Constitutional:      Appearance: She is not ill-appearing.  HENT:     Head: Normocephalic.     Ears:     Comments: Significant bulging w/ serous fluid bilateral TM No erythema or injection    Mouth/Throat:     Pharynx: Posterior oropharyngeal erythema present. No oropharyngeal exudate.  Eyes:     Conjunctiva/sclera: Conjunctivae normal.  Cardiovascular:     Rate and Rhythm: Normal rate.  Pulmonary:     Effort: Pulmonary effort is normal. No respiratory distress.  Neurological:     General: No focal  deficit present.     Mental Status: She is alert and oriented to person, place, and time.  Psychiatric:        Mood and Affect: Mood normal.        Behavior: Behavior normal.      No results found for any visits on 07/30/23.  Assessment & Plan    Acute non-recurrent maxillary sinusitis -     predniSONE ; Take 1 tablet (20 mg total) by mouth daily with breakfast for 5 days.  Dispense: 5 tablet; Refill: 0 -     Azelastine  HCl; Place 1 spray into both nostrils 2 (two) times daily. Use in each nostril as directed  Dispense: 30 mL; Refill: 1  Non-recurrent acute serous otitis media of both ears -     predniSONE ;  Take 1 tablet (20 mg total) by mouth daily with breakfast for 5 days.  Dispense: 5 tablet; Refill: 0 -     Azelastine  HCl; Place 1 spray into both nostrils 2 (two) times daily. Use in each nostril as directed  Dispense: 30 mL; Refill: 1   Advising prednisone  20 mg x 5 days, adding azelastine  to flonase  regimen. Recommending antihistamines otc. Recommending regular, bid saline rinses before using nasal sprays.  If symptoms persist after steroid treatment, would try antibiotics.  Return if symptoms worsen or fail to improve.       Manuelita Flatness, PA-C  [facility] [phone] (phone) [phone] (fax)  [facility]

## 2023-08-04 ENCOUNTER — Other Ambulatory Visit: Payer: Self-pay | Admitting: Cardiology

## 2023-08-05 ENCOUNTER — Telehealth: Payer: Self-pay | Admitting: Cardiology

## 2023-08-05 MED ORDER — PROPRANOLOL HCL 20 MG PO TABS: 20.0000 mg | ORAL_TABLET | Freq: Three times a day (TID) | ORAL | 3 refills | Status: DC

## 2023-08-05 NOTE — Telephone Encounter (Signed)
 Pt's medication was sent to pt's pharmacy as requested. Confirmation received.

## 2023-08-05 NOTE — Telephone Encounter (Signed)
*  STAT* If patient is at the pharmacy, call can be transferred to refill team.   1. Which medications need to be refilled? (please list name of each medication and dose if known)   propranolol  (INDERAL ) 20 MG tablet    2. Which pharmacy/location (including street and city if local pharmacy) is medication to be sent to? [id number] - [place], [place] - [address]   3. Do they need a 30 day or 90 day supply? 90

## 2023-08-06 ENCOUNTER — Telehealth: Payer: Self-pay

## 2023-08-06 ENCOUNTER — Other Ambulatory Visit: Payer: Self-pay | Admitting: Physician Assistant

## 2023-08-06 DIAGNOSIS — J0100 Acute maxillary sinusitis, unspecified: Secondary | ICD-10-CM

## 2023-08-06 MED ORDER — DOXYCYCLINE HYCLATE 100 MG PO TABS: 100.0000 mg | ORAL_TABLET | Freq: Two times a day (BID) | ORAL | 0 refills | Status: AC

## 2023-08-06 NOTE — Telephone Encounter (Signed)
 Copied from CRM [id number]. Topic: Clinical - Pink Word Triage >> Aug 06, 2023  9:31 AM Burnard DEL wrote: Reason for Triage: patient called in a stating that she was seen  on 07/30/2023 with [name] for ear pain and prescribed prednisone  and a nasal spray. She did let her know that if shes not better,she may need an antibiotic. She's calling in today stating that she is still having a cough,phlegm,and wheezing. She would like to know if an antibiotic could be sent in for her?  [id number] - [place], [place] - [address]  Phone: [phone] Fax: [phone]

## 2023-08-06 NOTE — Telephone Encounter (Signed)
 Called and LVM letting patent know medication was sent in

## 2023-08-10 ENCOUNTER — Ambulatory Visit: Payer: 59 | Admitting: Bariatrics

## 2023-08-12 ENCOUNTER — Telehealth: Payer: Self-pay

## 2023-08-12 ENCOUNTER — Encounter: Payer: Self-pay | Admitting: Bariatrics

## 2023-08-12 ENCOUNTER — Ambulatory Visit: Payer: 59 | Admitting: Bariatrics

## 2023-08-12 ENCOUNTER — Other Ambulatory Visit (HOSPITAL_BASED_OUTPATIENT_CLINIC_OR_DEPARTMENT_OTHER): Payer: Self-pay

## 2023-08-12 VITALS — BP 135/85 | HR 70 | Temp 97.7°F | Ht 67.0 in | Wt 217.0 lb

## 2023-08-12 DIAGNOSIS — Z6833 Body mass index (BMI) 33.0-33.9, adult: Secondary | ICD-10-CM | POA: Diagnosis not present

## 2023-08-12 DIAGNOSIS — R632 Polyphagia: Secondary | ICD-10-CM | POA: Diagnosis not present

## 2023-08-12 DIAGNOSIS — I10 Essential (primary) hypertension: Secondary | ICD-10-CM | POA: Diagnosis not present

## 2023-08-12 DIAGNOSIS — E669 Obesity, unspecified: Secondary | ICD-10-CM

## 2023-08-12 DIAGNOSIS — E66811 Obesity, class 1: Secondary | ICD-10-CM

## 2023-08-12 MED ORDER — WEGOVY 1 MG/0.5ML ~~LOC~~ SOAJ
1.0000 mg | SUBCUTANEOUS | 0 refills | Status: DC
  Filled 2023-08-12: qty 2, 28d supply, fill #0

## 2023-08-12 MED ORDER — METFORMIN HCL 500 MG PO TABS
500.0000 mg | ORAL_TABLET | Freq: Two times a day (BID) | ORAL | 0 refills | Status: DC
  Filled 2023-08-12: qty 60, 30d supply, fill #0

## 2023-08-12 NOTE — Progress Notes (Signed)
WEIGHT SUMMARY AND BIOMETRICS  Weight Lost Since Last Visit: 1lb  Weight Gained Since Last Visit: 0   Vitals Temp: 97.7 F (36.5 C) BP: 135/85 Pulse Rate: 70 SpO2: 98 %   Anthropometric Measurements Height: 5\' 7"  (1.702 m) Weight: 217 lb (98.4 kg) BMI (Calculated): 33.98 Weight at Last Visit: 218lb Weight Lost Since Last Visit: 1lb Weight Gained Since Last Visit: 0 Starting Weight: 224lb Total Weight Loss (lbs): 7 lb (3.175 kg)   Body Composition  Body Fat %: 41.7 % Fat Mass (lbs): 90.6 lbs Muscle Mass (lbs): 120.2 lbs Total Body Water (lbs): 83.8 lbs Visceral Fat Rating : 11   Other Clinical Data Fasting: yes Labs: no Today's Visit #: 17 Starting Date: 03/11/22    OBESITY Connie Blanchard is here to discuss her progress with her obesity treatment plan along with follow-up of her obesity related diagnoses.    Nutrition Plan: the Category 2 plan - 75% adherence.  Current exercise: none  Interim History:  She is down 1 lb since her last visit.  Eating all of the food on the plan., Protein intake is as prescribed, and Is not skipping meals   Pharmacotherapy: Connie Blanchard is on Wegovy 0.50 mg SQ weekly Adverse side effects: None Hunger is moderately controlled.  Cravings are moderately controlled.  Assessment/Plan:   Connie Blanchard endorses excessive hunger.  Medication(s): Connie Blanchard and Metformin Effects of medication:  moderately controlled. Cravings are moderately controlled.   Plan: Medication(s): Wegovy 1.0 mg SQ weekly and metformin 500 mg twice daily.  Will increase water, protein and fiber to help assuage hunger.  Will minimize foods that have a high glucose index/load to minimize reactive hypoglycemia.  Will continue to watch her portion size, read labels and make healthy choices.  Hypertension Hypertension well controlled.   Medication(s): Inderal 20 mg daily  BP Readings from Last 3 Encounters:  08/12/23 135/85  07/30/23 (!) 144/90  07/15/23 118/76   Lab Results  Component Value Date   CREATININE 0.90 06/29/2022   CREATININE 0.83 03/11/2022   CREATININE 1.08 12/25/2021   Lab Results  Component Value Date   GFR 72.91 06/29/2022   GFR 58.80 (L) 12/25/2021   GFR 72.02 07/01/2020    Plan: Continue all antihypertensives at current dosages. No added salt. Will keep sodium content to 1,500 mg or less per day.      Generalized Obesity: Current BMI BMI (Calculated): 33.98   Pharmacotherapy Plan Continue and increase dose  Wegovy 1.0 mg SQ weekly and continue her metformin at 500 mg twice daily daily.  Connie Blanchard is currently in the action stage of change. As such, her goal is to continue with weight loss efforts.  She has agreed to the Category 2 plan.  Exercise goals: For substantial health benefits, adults should do at least 150 minutes (2 hours and 30 minutes) a week of moderate-intensity, or 75 minutes (  1 hour and 15 minutes) a week of vigorous-intensity aerobic physical activity, or an equivalent combination of moderate- and vigorous-intensity aerobic activity. Aerobic activity should be performed in episodes of at least 10 minutes, and preferably, it should be spread throughout the week. She will continue with walking and is considering hot yoga.  Behavioral modification strategies: increasing lean protein intake, no meal skipping, meal planning , increase water intake, better snacking choices, planning for success, increasing vegetables, increasing fiber rich foods, keep healthy foods in the home, and mindful eating.  Connie Blanchard has agreed to follow-up with our clinic in 4 weeks.       Objective:   VITALS: Per patient if applicable, see vitals. GENERAL: Alert and in no acute distress. CARDIOPULMONARY: No increased WOB. Speaking in clear sentences.  PSYCH: Pleasant and cooperative. Speech  normal rate and rhythm. Affect is appropriate. Insight and judgement are appropriate. Attention is focused, linear, and appropriate.  NEURO: Oriented as arrived to appointment on time with no prompting.   Attestation Statements:   This was prepared with the assistance of Engineer, civil (consulting).  Occasional wrong-word or sound-a-like substitutions may have occurred due to the inherent limitations of voice recognition

## 2023-08-12 NOTE — Telephone Encounter (Signed)
Started PA for [employer] via covermymeds 

## 2023-08-13 ENCOUNTER — Other Ambulatory Visit (HOSPITAL_BASED_OUTPATIENT_CLINIC_OR_DEPARTMENT_OTHER): Payer: Self-pay

## 2023-08-16 NOTE — Telephone Encounter (Signed)
[facility] approved 08/12/2023-08/11/2024

## 2023-08-19 ENCOUNTER — Other Ambulatory Visit (HOSPITAL_BASED_OUTPATIENT_CLINIC_OR_DEPARTMENT_OTHER): Payer: Self-pay

## 2023-08-19 MED ORDER — ESTRADIOL 1 MG PO TABS
1.0000 mg | ORAL_TABLET | Freq: Every day | ORAL | 4 refills | Status: DC
  Filled 2023-08-19 – 2023-08-26 (×2): qty 30, 30d supply, fill #0

## 2023-08-26 ENCOUNTER — Other Ambulatory Visit: Payer: Self-pay

## 2023-08-26 ENCOUNTER — Other Ambulatory Visit (HOSPITAL_BASED_OUTPATIENT_CLINIC_OR_DEPARTMENT_OTHER): Payer: Self-pay

## 2023-09-06 ENCOUNTER — Other Ambulatory Visit (HOSPITAL_BASED_OUTPATIENT_CLINIC_OR_DEPARTMENT_OTHER): Payer: Self-pay

## 2023-09-13 ENCOUNTER — Other Ambulatory Visit: Payer: Self-pay | Admitting: Bariatrics

## 2023-09-13 ENCOUNTER — Other Ambulatory Visit (HOSPITAL_BASED_OUTPATIENT_CLINIC_OR_DEPARTMENT_OTHER): Payer: Self-pay

## 2023-09-13 ENCOUNTER — Ambulatory Visit: Payer: 59 | Admitting: Bariatrics

## 2023-09-15 ENCOUNTER — Other Ambulatory Visit (HOSPITAL_BASED_OUTPATIENT_CLINIC_OR_DEPARTMENT_OTHER): Payer: Self-pay

## 2023-09-21 ENCOUNTER — Other Ambulatory Visit (HOSPITAL_BASED_OUTPATIENT_CLINIC_OR_DEPARTMENT_OTHER): Payer: Self-pay

## 2023-09-21 ENCOUNTER — Encounter: Payer: Self-pay | Admitting: Bariatrics

## 2023-09-21 ENCOUNTER — Ambulatory Visit: Payer: 59 | Admitting: Bariatrics

## 2023-09-21 VITALS — BP 132/88 | HR 68 | Temp 98.2°F | Ht 67.0 in | Wt 214.0 lb

## 2023-09-21 DIAGNOSIS — E6609 Other obesity due to excess calories: Secondary | ICD-10-CM

## 2023-09-21 DIAGNOSIS — Z6833 Body mass index (BMI) 33.0-33.9, adult: Secondary | ICD-10-CM | POA: Diagnosis not present

## 2023-09-21 DIAGNOSIS — E785 Hyperlipidemia, unspecified: Secondary | ICD-10-CM | POA: Diagnosis not present

## 2023-09-21 DIAGNOSIS — R632 Polyphagia: Secondary | ICD-10-CM | POA: Diagnosis not present

## 2023-09-21 DIAGNOSIS — E782 Mixed hyperlipidemia: Secondary | ICD-10-CM

## 2023-09-21 DIAGNOSIS — E669 Obesity, unspecified: Secondary | ICD-10-CM | POA: Diagnosis not present

## 2023-09-21 MED ORDER — WEGOVY 1 MG/0.5ML ~~LOC~~ SOAJ
1.0000 mg | SUBCUTANEOUS | 0 refills | Status: DC
  Filled 2023-09-21: qty 2, 28d supply, fill #0

## 2023-09-21 MED ORDER — METFORMIN HCL 500 MG PO TABS
500.0000 mg | ORAL_TABLET | Freq: Two times a day (BID) | ORAL | 0 refills | Status: DC
  Filled 2023-09-21: qty 60, 30d supply, fill #0

## 2023-09-21 NOTE — Progress Notes (Signed)
 WEIGHT SUMMARY AND BIOMETRICS  Weight Lost Since Last Visit: 3lb  Weight Gained Since Last Visit: 0lb   Vitals Temp: 98.2 F (36.8 C) BP: 132/88 Pulse Rate: 68 SpO2: 98 %   Anthropometric Measurements Height: 5\' 7"  (1.702 m) Weight: 214 lb (97.1 kg) BMI (Calculated): 33.51 Weight at Last Visit: 217lb Weight Lost Since Last Visit: 3lb Weight Gained Since Last Visit: 0lb Starting Weight: 224lb Total Weight Loss (lbs): 10 lb (4.536 kg)   Body Composition  Body Fat %: 40 % Fat Mass (lbs): 85.6 lbs Muscle Mass (lbs): 122.2 lbs Total Body Water (lbs): 84.4 lbs Visceral Fat Rating : 10   Other Clinical Data Fasting: No Labs: No Today's Visit #: 18 Starting Date: 03/11/22    OBESITY Connie Blanchard is here to discuss her progress with her obesity treatment plan along with follow-up of her obesity related diagnoses.    Nutrition Plan: the Category 2 plan - 75% adherence.  Current exercise: walking  Interim History:  She is down another 3 lbs since her last visit. She is snacking less in the evening.  Eating all of the food on the plan., Protein intake is as prescribed, Is skipping meals, Meeting protein goals., and Water intake is adequate.   Pharmacotherapy: Connie Blanchard is on Wegovy 1.0 mg SQ weekly and Metformin 500 mg twice daily with meals Adverse side effects: None Hunger is moderately controlled.  Cravings are well controlled.  Assessment/Plan:   Connie Blanchard endorses excessive hunger.  Medication(s): Connie Blanchard and Metformin  Effects of medication:  moderately controlled. Cravings are moderately controlled.  States that the [employer] has been helpful controlling her appetite.  Plan: Medication(s): Wegovy 1.0 mg SQ weekly Will increase water, protein and fiber to help assuage hunger.  Will minimize foods that have a high glucose index/load to  minimize reactive hypoglycemia.   Hyperlipidemia LDL is not at goal. Medication(s): none Cardiovascular risk factors: hypertension, obesity (BMI >= 30 kg/m2), and sedentary lifestyle  Lab Results  Component Value Date   CHOL 203 (H) 06/29/2022   HDL 61.40 06/29/2022   LDLCALC 116 (H) 06/29/2022   LDLDIRECT 83.0 12/25/2021   TRIG 125.0 06/29/2022   CHOLHDL 3 06/29/2022   Lab Results  Component Value Date   ALT 14 06/29/2022   AST 16 06/29/2022   ALKPHOS 56 06/29/2022   BILITOT 0.6 06/29/2022   The 10-year ASCVD risk score (Arnett DK, et al., 2019) is: 2.4%   Values used to calculate the score:     Age: 53 years     Sex: Female     Is Non-Hispanic African American: No     Diabetic: No     Tobacco smoker: No     Systolic Blood Pressure: 132 mmHg     Is BP treated: Yes     HDL Cholesterol: 61.4 mg/dL     Total Cholesterol: 203 mg/dL  Plan:  Continue statin.  Will avoid all trans fats.  Will read labels Will minimize saturated fats except the following: low fat meats in moderation, diary, and limited dark chocolate.     Generalized Obesity: Current BMI BMI (Calculated): 33.51   Pharmacotherapy Plan Continue and refill  Wegovy 1.0 mg SQ weekly  Connie Blanchard is currently in the action stage of change. As such, her goal is to continue with weight loss efforts.  She has agreed to the Category 2 plan.  Exercise goals: All adults should avoid inactivity. Some physical activity is better than none, and adults who participate in any amount of physical activity gain some health benefits.  Behavioral modification strategies: increasing lean protein intake, no meal skipping, decrease eating out, meal planning , better snacking choices, planning for success, decrease snacking , and avoiding temptations.  Connie Blanchard has agreed to follow-up with our clinic in 4 weeks.       Objective:   VITALS: Per patient if applicable, see vitals. GENERAL: Alert and in no acute  distress. CARDIOPULMONARY: No increased WOB. Speaking in clear sentences.  PSYCH: Pleasant and cooperative. Speech normal rate and rhythm. Affect is appropriate. Insight and judgement are appropriate. Attention is focused, linear, and appropriate.  NEURO: Oriented as arrived to appointment on time with no prompting.   Attestation Statements:   This was prepared with the assistance of Engineer, civil (consulting).  Occasional wrong-word or sound-a-like substitutions may have occurred due to the inherent limitations of voice recognition   Connie Capra, DO

## 2023-09-26 ENCOUNTER — Other Ambulatory Visit: Payer: Self-pay | Admitting: Family Medicine

## 2023-09-26 ENCOUNTER — Encounter: Payer: Self-pay | Admitting: Family Medicine

## 2023-09-27 ENCOUNTER — Other Ambulatory Visit: Payer: Self-pay | Admitting: Family Medicine

## 2023-09-27 NOTE — Telephone Encounter (Signed)
 Called patient and scheduled a virtual visit in June

## 2023-09-28 ENCOUNTER — Encounter: Payer: Self-pay | Admitting: Family Medicine

## 2023-09-28 ENCOUNTER — Other Ambulatory Visit: Payer: Self-pay | Admitting: Emergency Medicine

## 2023-09-28 DIAGNOSIS — R238 Other skin changes: Secondary | ICD-10-CM

## 2023-10-11 ENCOUNTER — Ambulatory Visit: Payer: 59 | Admitting: Bariatrics

## 2023-10-19 ENCOUNTER — Encounter: Payer: Self-pay | Admitting: Bariatrics

## 2023-10-19 ENCOUNTER — Other Ambulatory Visit (HOSPITAL_BASED_OUTPATIENT_CLINIC_OR_DEPARTMENT_OTHER): Payer: Self-pay

## 2023-10-19 ENCOUNTER — Ambulatory Visit: Payer: 59 | Admitting: Bariatrics

## 2023-10-19 VITALS — BP 124/84 | HR 70 | Temp 97.9°F | Ht 67.0 in | Wt 212.0 lb

## 2023-10-19 DIAGNOSIS — R632 Polyphagia: Secondary | ICD-10-CM

## 2023-10-19 DIAGNOSIS — Z6833 Body mass index (BMI) 33.0-33.9, adult: Secondary | ICD-10-CM

## 2023-10-19 DIAGNOSIS — E669 Obesity, unspecified: Secondary | ICD-10-CM

## 2023-10-19 DIAGNOSIS — E782 Mixed hyperlipidemia: Secondary | ICD-10-CM

## 2023-10-19 DIAGNOSIS — E559 Vitamin D deficiency, unspecified: Secondary | ICD-10-CM | POA: Diagnosis not present

## 2023-10-19 DIAGNOSIS — E66811 Obesity, class 1: Secondary | ICD-10-CM

## 2023-10-19 DIAGNOSIS — R739 Hyperglycemia, unspecified: Secondary | ICD-10-CM | POA: Diagnosis not present

## 2023-10-19 DIAGNOSIS — E785 Hyperlipidemia, unspecified: Secondary | ICD-10-CM

## 2023-10-19 MED ORDER — METFORMIN HCL 500 MG PO TABS
500.0000 mg | ORAL_TABLET | Freq: Two times a day (BID) | ORAL | 0 refills | Status: DC
  Filled 2023-10-19: qty 60, 30d supply, fill #0

## 2023-10-19 MED ORDER — WEGOVY 1 MG/0.5ML ~~LOC~~ SOAJ
1.0000 mg | SUBCUTANEOUS | 0 refills | Status: DC
Start: 2023-10-19 — End: 2023-11-16
  Filled 2023-10-19: qty 2, 28d supply, fill #0

## 2023-10-19 NOTE — Progress Notes (Signed)
 WEIGHT SUMMARY AND BIOMETRICS  Weight Lost Since Last Visit: 2lb  Weight Gained Since Last Visit: 0   Vitals Temp: 97.9 F (36.6 C) BP: 124/84 Pulse Rate: 70 SpO2: 99 %   Anthropometric Measurements Height: 5\' 7"  (1.702 m) Weight: 212 lb (96.2 kg) BMI (Calculated): 33.2 Weight at Last Visit: 214lb Weight Lost Since Last Visit: 2lb Weight Gained Since Last Visit: 0 Starting Weight: 224lb Total Weight Loss (lbs): 12 lb (5.443 kg)   Body Composition  Body Fat %: 39.8 % Fat Mass (lbs): 84.4 lbs Muscle Mass (lbs): 121.4 lbs Total Body Water (lbs): 84 lbs Visceral Fat Rating : 10   Other Clinical Data Fasting: yes Labs: yes Today's Visit #: 55 Starting Date: 03/11/22    OBESITY Connie Blanchard is here to discuss her progress with her obesity treatment plan along with follow-up of her obesity related diagnoses.    Nutrition Plan: the Category 2 plan - 75% adherence.  Current exercise: walking and yoga.  Interim History:  She is down an additional 2 lbs since her last visit.  Eating all of the food on the plan., Protein intake is as prescribed, Is not skipping meals, Water intake is adequate., and Denies polyphagia   Pharmacotherapy: Connie Blanchard is on Wegovy 1.0 mg SQ weekly Adverse side effects: None Hunger is moderately controlled.  Cravings are moderately controlled.  Assessment/Plan:   Connie Blanchard endorses excessive hunger.  Medication(s): Connie Blanchard  Effects of medication:  moderately controlled. Cravings are moderately controlled.   Plan: Medication(s): Wegovy 1.0 mg SQ weekly Will increase water, protein and fiber to help assuage hunger.  Will minimize foods that have a high glucose index/load to minimize reactive hypoglycemia.    Hyperlipidemia:   Taking medications as directed.   Plan: Will check lipids. Continue medications.  Continue diet and exercise.    Vitamin D deficiency:   Taking vitamin D prescription/OTC  Plan: Continue vitamin D. Will check vitamin D.    Hyperglycemia:   She needs labs today.  She is taking medications as directed.   Plan: Will check HgbA1c and insulin. Will continue to work on the plan and exercise as able.    Labs done today (CMP, Lipids, HgbA1c, insulin, vitamin D).    Generalized Obesity: Current BMI BMI (Calculated): 33.2   Pharmacotherapy Plan Continue and refill  Wegovy 1.0 mg SQ weekly and Metformin 500 mg twice daily with meals  Connie Blanchard is currently in the action stage of change. As such, her goal is to continue with weight loss efforts.  She has agreed to the Category 2 plan.  Exercise goals: All adults should avoid inactivity. Some physical activity is better than none, and adults who participate in any amount of physical activity gain some health benefits.  Behavioral modification strategies: increasing lean protein intake, decreasing simple carbohydrates , meal planning , better snacking choices, planning for success, and  increasing vegetables.  Connie Blanchard has agreed to follow-up with our clinic in 4 weeks.     Objective:   VITALS: Per patient if applicable, see vitals. GENERAL: Alert and in no acute distress. CARDIOPULMONARY: No increased WOB. Speaking in clear sentences.  PSYCH: Pleasant and cooperative. Speech normal rate and rhythm. Affect is appropriate. Insight and judgement are appropriate. Attention is focused, linear, and appropriate.  NEURO: Oriented as arrived to appointment on time with no prompting.   Attestation Statements:     This was prepared with the assistance of Engineer, civil (consulting).  Occasional wrong-word or sound-a-like substitutions may have occurred due to the inherent limitations of voice recognition   Corinna Capra, DO

## 2023-10-21 LAB — COMPREHENSIVE METABOLIC PANEL WITH GFR
ALT: 11 IU/L (ref 0–32)
AST: 13 IU/L (ref 0–40)
Albumin: 4 g/dL (ref 3.8–4.9)
Alkaline Phosphatase: 69 IU/L (ref 44–121)
BUN/Creatinine Ratio: 13 (ref 9–23)
BUN: 11 mg/dL (ref 6–24)
Bilirubin Total: 0.3 mg/dL (ref 0.0–1.2)
CO2: 23 mmol/L (ref 20–29)
Calcium: 9.2 mg/dL (ref 8.7–10.2)
Chloride: 100 mmol/L (ref 96–106)
Creatinine, Ser: 0.83 mg/dL (ref 0.57–1.00)
Globulin, Total: 3.2 g/dL (ref 1.5–4.5)
Glucose: 90 mg/dL (ref 70–99)
Potassium: 4.1 mmol/L (ref 3.5–5.2)
Sodium: 140 mmol/L (ref 134–144)
Total Protein: 7.2 g/dL (ref 6.0–8.5)
eGFR: 84 mL/min/{1.73_m2} (ref >59)

## 2023-10-21 LAB — LIPID PANEL WITH LDL/HDL RATIO
Cholesterol, Total: 193 mg/dL (ref 100–199)
HDL: 62 mg/dL (ref >39)
LDL Chol Calc (NIH): 109 mg/dL — ABNORMAL HIGH (ref 0–99)
LDL/HDL Ratio: 1.8 ratio (ref 0.0–3.2)
Triglycerides: 122 mg/dL (ref 0–149)
VLDL Cholesterol Cal: 22 mg/dL (ref 5–40)

## 2023-10-21 LAB — HEMOGLOBIN A1C
Est. average glucose Bld gHb Est-mCnc: 97 mg/dL
Hgb A1c MFr Bld: 5 % (ref 4.8–5.6)

## 2023-10-21 LAB — VITAMIN D 25 HYDROXY (VIT D DEFICIENCY, FRACTURES): Vit D, 25-Hydroxy: 37.9 ng/mL (ref 30.0–100.0)

## 2023-10-21 LAB — INSULIN, RANDOM: INSULIN: 15.3 u[IU]/mL (ref 2.6–24.9)

## 2023-10-22 ENCOUNTER — Other Ambulatory Visit (HOSPITAL_BASED_OUTPATIENT_CLINIC_OR_DEPARTMENT_OTHER): Payer: Self-pay

## 2023-10-30 ENCOUNTER — Other Ambulatory Visit: Payer: Self-pay | Admitting: Cardiology

## 2023-11-16 ENCOUNTER — Encounter: Payer: Self-pay | Admitting: Bariatrics

## 2023-11-16 ENCOUNTER — Ambulatory Visit: Admitting: Bariatrics

## 2023-11-16 ENCOUNTER — Other Ambulatory Visit (HOSPITAL_BASED_OUTPATIENT_CLINIC_OR_DEPARTMENT_OTHER): Payer: Self-pay

## 2023-11-16 VITALS — BP 128/84 | HR 72 | Temp 97.7°F | Ht 67.0 in | Wt 211.0 lb

## 2023-11-16 DIAGNOSIS — R632 Polyphagia: Secondary | ICD-10-CM

## 2023-11-16 DIAGNOSIS — E66811 Obesity, class 1: Secondary | ICD-10-CM

## 2023-11-16 DIAGNOSIS — Z6833 Body mass index (BMI) 33.0-33.9, adult: Secondary | ICD-10-CM

## 2023-11-16 DIAGNOSIS — E669 Obesity, unspecified: Secondary | ICD-10-CM

## 2023-11-16 MED ORDER — WEGOVY 1 MG/0.5ML ~~LOC~~ SOAJ
1.0000 mg | SUBCUTANEOUS | 0 refills | Status: DC
  Filled 2023-11-16: qty 2, 28d supply, fill #0

## 2023-11-16 NOTE — Progress Notes (Signed)
 WEIGHT SUMMARY AND BIOMETRICS  Weight Lost Since Last Visit: 1lb  Weight Gained Since Last Visit: 0   Vitals Temp: 97.7 F (36.5 C) BP: 128/84 Pulse Rate: 72 SpO2: 99 %   Anthropometric Measurements Height: 5\' 7"  (1.702 m) Weight: 211 lb (95.7 kg) BMI (Calculated): 33.04 Weight at Last Visit: 212lb Weight Lost Since Last Visit: 1lb Weight Gained Since Last Visit: 0 Starting Weight: 224lb Total Weight Loss (lbs): 13 lb (5.897 kg)   Body Composition  Body Fat %: 35.5 % Fat Mass (lbs): 75.2 lbs Muscle Mass (lbs): 129.8 lbs Total Body Water  (lbs): 87.6 lbs Visceral Fat Rating : 9   Other Clinical Data Fasting: no Labs: no Today's Visit #: 20 Starting Date: 03/11/22    OBESITY Alantra is here to discuss her progress with her obesity treatment plan along with follow-up of her obesity related diagnoses.    Nutrition Plan: the Category 2 plan - 75% adherence.  Current exercise: walking and yoga  Interim History:  She is down 1 llb since the last visit.  Eating all of the food on the plan., Protein intake is as prescribed, Is skipping meals, and Water  intake is adequate.   Pharmacotherapy: Luciel is on Wegovy  1.0 mg SQ weekly Adverse side effects: None Hunger is moderately controlled.  Cravings are moderately controlled.  Assessment/Plan:   Jalayiah Bibian endorses excessive hunger.  Medication(s): Wegovy  Effects of medication:  moderately controlled. Cravings are moderately controlled.   Plan: Medication(s): Wegovy  1.0 mg SQ weekly Will increase water , protein and fiber to help assuage hunger.  Will minimize foods that have a high glucose index/load to minimize reactive hypoglycemia.   Labs reviewed today (CMP, Lipids, HgbA1c, insulin , vitamin D ).    Generalized Obesity: Current BMI BMI (Calculated): 33.04   Pharmacotherapy  Plan Continue and refill  Wegovy  1.0 mg SQ weekly  Waleska is currently in the action stage of change. As such, her goal is to continue with weight loss efforts.  She has agreed to the Category 2 plan.  Exercise goals: All adults should avoid inactivity. Some physical activity is better than none, and adults who participate in any amount of physical activity gain some health benefits.  She is doing her Yoga and walking.   Behavioral modification strategies: increasing lean protein intake, meal planning , increase water  intake, better snacking choices, planning for success, avoiding temptations, keep healthy foods in the home, and mindful eating.  Torsha has agreed to follow-up with our clinic in 4 weeks.    Objective:   VITALS: Per patient if applicable, see vitals. GENERAL: Alert and in no acute distress. CARDIOPULMONARY: No increased WOB. Speaking in clear sentences.  PSYCH: Pleasant and cooperative. Speech normal rate and rhythm. Affect is appropriate. Insight and judgement are appropriate. Attention is focused, linear, and appropriate.  NEURO: Oriented as arrived to appointment on time with no prompting.  Attestation Statements:    This was prepared with the assistance of Engineer, civil (consulting).  Occasional wrong-word or sound-a-like substitutions may have occurred due to the inherent limitations of voice recognition   Kirk Peper, DO

## 2023-12-14 ENCOUNTER — Ambulatory Visit: Admitting: Bariatrics

## 2023-12-22 ENCOUNTER — Ambulatory Visit: Admitting: Bariatrics

## 2023-12-22 ENCOUNTER — Encounter: Payer: Self-pay | Admitting: Bariatrics

## 2023-12-22 ENCOUNTER — Other Ambulatory Visit (HOSPITAL_BASED_OUTPATIENT_CLINIC_OR_DEPARTMENT_OTHER): Payer: Self-pay

## 2023-12-22 VITALS — BP 126/83 | HR 67 | Temp 97.5°F | Ht 67.0 in | Wt 212.0 lb

## 2023-12-22 DIAGNOSIS — E6609 Other obesity due to excess calories: Secondary | ICD-10-CM

## 2023-12-22 DIAGNOSIS — Z6833 Body mass index (BMI) 33.0-33.9, adult: Secondary | ICD-10-CM

## 2023-12-22 DIAGNOSIS — R739 Hyperglycemia, unspecified: Secondary | ICD-10-CM

## 2023-12-22 DIAGNOSIS — E669 Obesity, unspecified: Secondary | ICD-10-CM | POA: Diagnosis not present

## 2023-12-22 DIAGNOSIS — R632 Polyphagia: Secondary | ICD-10-CM

## 2023-12-22 MED ORDER — METFORMIN HCL 500 MG PO TABS
500.0000 mg | ORAL_TABLET | Freq: Two times a day (BID) | ORAL | 0 refills | Status: DC
  Filled 2023-12-22: qty 60, 30d supply, fill #0

## 2023-12-22 MED ORDER — WEGOVY 1.7 MG/0.75ML ~~LOC~~ SOAJ
1.7000 mg | SUBCUTANEOUS | 0 refills | Status: DC
  Filled 2023-12-22: qty 3, 28d supply, fill #0

## 2023-12-22 NOTE — Progress Notes (Signed)
 WEIGHT SUMMARY AND BIOMETRICS  Weight Lost Since Last Visit: 0  Weight Gained Since Last Visit: 1lb   Vitals Temp: (!) 97.5 F (36.4 C) BP: 126/83 Pulse Rate: 67 SpO2: 99 %   Anthropometric Measurements Height: 5\' 7"  (1.702 m) Weight: 212 lb (96.2 kg) BMI (Calculated): 33.2 Weight at Last Visit: 211lb Weight Lost Since Last Visit: 0 Weight Gained Since Last Visit: 1lb Starting Weight: 224lb Total Weight Loss (lbs): 12 lb (5.443 kg)   Body Composition  Body Fat %: 36.2 % Fat Mass (lbs): 76.8 lbs Muscle Mass (lbs): 128.4 lbs Total Body Water  (lbs): 88 lbs Visceral Fat Rating : 10   Other Clinical Data Fasting: no Labs: no Today's Visit #: 21 Starting Date: 03/11/22    OBESITY Connie Blanchard is here to discuss her progress with her obesity treatment plan along with follow-up of her obesity related diagnoses.    Nutrition Plan: the Category 2 plan - 50% adherence.  Current exercise: none  Interim History:  She is up 1 lb since her last visit.  She states that she has had more stress in her life secondary to a death in the family and has been eating irregularly.  Eating all of the food on the plan., Protein intake is less than prescribed., Not journaling consistently., Water  intake is inadequate., and Reports polyphagia   Pharmacotherapy: Connie Blanchard is on Wegovy  1.0 mg SQ weekly Adverse side effects: None Hunger is moderately controlled.  Cravings are moderately controlled.  Assessment/Plan:   Connie Blanchard endorses excessive hunger.  Medication(s): Wegovy  1.0 mg Effects of medication:  moderately controlled. Cravings are moderately controlled.   Plan: Medication(s): Wegovy  1.7 SQ weekly Will increase water , protein and fiber to help assuage hunger.  Will minimize foods that have a high glucose index/load to minimize reactive hypoglycemia.   She will prepare more meals at home. She will do more meal planning.  Hyperglycemia:  Last A1c was 5.0  Medication(s): Wegovy  1.0 mg SQ weekly Lab Results  Component Value Date   HGBA1C 5.0 10/19/2023   HGBA1C 5.5 06/29/2022   HGBA1C 5.3 03/11/2022   HGBA1C 5.4 07/01/2020   HGBA1C 5.5 03/26/2020   Lab Results  Component Value Date   INSULIN  15.3 10/19/2023   INSULIN  18.0 03/11/2022   INSULIN  10.8 03/26/2020   INSULIN  23.4 03/16/2019   INSULIN  13.9 09/27/2018    Plan: Will minimize all refined carbohydrates both sweets and starches.  Will work on the plan and exercise.  Consider both aerobic and resistance training.  Will keep protein, water , and fiber intake high.  She will resume exercise.  She will eat on a regular basis and try to avoid skipping any meals. Continue and increase dose Wegovy  1.7 SQ weekly     Generalized Obesity: Current BMI BMI (Calculated): 33.2   Pharmacotherapy Plan Continue and increase dose  Wegovy  1.7 SQ weekly  Connie Blanchard is currently in the action stage of change. As such, her goal is to continue with weight loss efforts.  She has agreed to the Category 2 plan.  Exercise goals: All adults should avoid inactivity. Some physical activity is better than none, and adults who participate in any amount of physical activity gain some health benefits.  Behavioral modification strategies: increasing lean protein intake, decreasing simple carbohydrates , no meal skipping, meal planning , increase water  intake, better snacking choices, planning for success, increasing vegetables, increasing fiber rich foods, keep healthy foods in the home, weigh protein portions, and mindful eating.  Connie Blanchard has agreed to follow-up with our clinic in 4 weeks.     Objective:   VITALS: Per patient if applicable, see vitals. GENERAL: Alert and in no acute distress. CARDIOPULMONARY: No increased WOB. Speaking in clear sentences.  PSYCH: Pleasant and cooperative.  Speech normal rate and rhythm. Affect is appropriate. Insight and judgement are appropriate. Attention is focused, linear, and appropriate.  NEURO: Oriented as arrived to appointment on time with no prompting.   Attestation Statements:   This was prepared with the assistance of Engineer, civil (consulting).  Occasional wrong-word or sound-a-like substitutions may have occurred due to the inherent limitations of voice recognition   Kirk Peper, DO

## 2023-12-24 ENCOUNTER — Other Ambulatory Visit: Payer: Self-pay | Admitting: Family Medicine

## 2023-12-26 NOTE — Assessment & Plan Note (Signed)
 hgba1c acceptable, minimize simple carbs. Increase exercise as tolerated.

## 2023-12-26 NOTE — Assessment & Plan Note (Signed)
 Encourage heart healthy diet such as MIND or DASH diet, increase exercise, avoid trans fats, simple carbohydrates and processed foods, consider a krill or fish or flaxseed oil cap daily.

## 2023-12-26 NOTE — Assessment & Plan Note (Signed)
 Well controlled, no changes to meds. Encouraged heart healthy diet such as the DASH diet and exercise as tolerated.

## 2023-12-26 NOTE — Assessment & Plan Note (Signed)
 Encouraged DASH or MIND diet, decrease po intake and increase exercise as tolerated. Needs 7-8 hours of sleep nightly. Avoid trans fats, eat small, frequent meals every 4-5 hours with lean proteins, complex carbs and healthy fats. Minimize simple carbs, high fat foods and processed foods

## 2023-12-26 NOTE — Assessment & Plan Note (Signed)
 Supplement and monitor

## 2023-12-28 ENCOUNTER — Telehealth: Admitting: Family Medicine

## 2023-12-28 ENCOUNTER — Other Ambulatory Visit (HOSPITAL_BASED_OUTPATIENT_CLINIC_OR_DEPARTMENT_OTHER): Payer: Self-pay

## 2023-12-28 DIAGNOSIS — E782 Mixed hyperlipidemia: Secondary | ICD-10-CM | POA: Diagnosis not present

## 2023-12-28 DIAGNOSIS — E559 Vitamin D deficiency, unspecified: Secondary | ICD-10-CM

## 2023-12-28 DIAGNOSIS — R739 Hyperglycemia, unspecified: Secondary | ICD-10-CM | POA: Diagnosis not present

## 2023-12-28 DIAGNOSIS — I10 Essential (primary) hypertension: Secondary | ICD-10-CM

## 2023-12-28 DIAGNOSIS — Z6835 Body mass index (BMI) 35.0-35.9, adult: Secondary | ICD-10-CM

## 2023-12-28 DIAGNOSIS — E66812 Obesity, class 2: Secondary | ICD-10-CM

## 2023-12-28 MED ORDER — FLUTICASONE PROPIONATE 50 MCG/ACT NA SUSP
2.0000 | Freq: Every day | NASAL | 6 refills | Status: DC
  Filled 2023-12-28: qty 16, 30d supply, fill #0

## 2023-12-29 ENCOUNTER — Encounter: Payer: Self-pay | Admitting: Family Medicine

## 2023-12-29 NOTE — Progress Notes (Signed)
 MyChart Video Visit    Virtual Visit via Video Note   This patient is at least at moderate risk for complications without adequate follow up. This format is felt to be most appropriate for this patient at this time. Physical exam was limited by quality of the video and audio technology used for the visit. Connie Blanchard was able to get the patient set up on a video visit.  Patient location: home Patient and provider in visit Provider location: Office  I discussed the limitations of evaluation and management by telemedicine and the availability of in person appointments. The patient expressed understanding and agreed to proceed.  Visit Date: 12/28/2023  Today's healthcare provider: Randie Bustle, MD     Subjective:    Patient ID: Connie Blanchard, female    DOB: 10/15/1970, 53 y.o.   MRN: [id number]  No chief complaint on file.   HPI Discussed the use of AI scribe software for clinical note transcription with the patient, who gave verbal consent to proceed.  History of Present Illness Connie Blanchard "Connie Blanchard" is a 53 year old female with hypertension who presents with concerns about potassium supplementation and ear pain.  She is currently taking Maxzide  for hypertension and initially experienced issues with potassium levels, leading to the prescription of potassium supplements. She finds the potassium pills difficult to swallow due to their large size and is interested in discontinuing them. No symptoms suggestive of potassium abnormality such as palpitations, muscle spasms, or cramps. Her blood pressure readings have been stable, and her weight has decreased as monitored [facility]. She does not check her blood pressure at home but does so during her visits to the wellness center.  She has been experiencing intermittent ear pain and tinnitus since December. The pain is localized in the ear and sometimes radiates down her throat. The tinnitus varies in  intensity, sometimes being very loud. She also experiences occasional headaches and congestion, primarily on the right side, and sometimes feels dizzy. The ear pain and tinnitus are not constant but come and go. No visual changes have been observed. The symptoms have persisted since her last visit in December for ear pain.    Past Medical History:  Diagnosis Date   Acute bronchitis 06/24/2012   Acute bronchitis with asthma 06/24/2012   Allergic state 10/15/2013   Anemia 08/25/2011   Anxiety    Anxiety    not current   At risk for heart disease 04/01/2020   Back pain 10/15/2013   Back pain    Cervical disc disease 04/22/2015   Chronic sinusitis 02/2012   current runny nose of clear drainage   Colon cancer screening 07/29/2011   Sees Dr Delane Fear of dermatology in [facility] Dr Leslee Rase of GYN for paps and [facility] and denies any concerns   Constipation    Dental crowns present    Depression    Depression with anxiety    in the past    Essential hypertension 10/06/2015   Gallbladder polyp 08/06/2020   GERD (gastroesophageal reflux disease)    H/O gestational diabetes mellitus, not currently pregnant 07/29/2011   h/o    Headache    history of migraines   History of blood transfusion    History of blood transfusion    History of gestational diabetes mellitus (GDM) 03/26/2020   History of hiatal hernia    Hyperglycemia 05/25/2017   Hyperlipidemia, mixed 10/06/2015   Hypertension    Insomnia  Insulin  resistance 06/29/2018   Iron deficiency anemia    Joint pain    Laryngopharyngeal reflux (LPR) 02/2012   current runny nose of clear drainage   Left lower quadrant pain 08/06/2020   Left shoulder pain 05/26/2017   Leg edema    Nausea 10/15/2013   Obese    Obesity    Other fatigue 10/12/2017   Palpitation    PCOS (polycystic ovarian syndrome)    Pedal edema 10/06/2015   Periumbilical pain 08/06/2020   Pharyngitis 05/07/2019   Postoperative state 03/17/2017   Right  shoulder pain 05/25/2017   Right upper quadrant pain 08/06/2020   Shortness of breath on exertion 10/12/2017   Vitamin D  deficiency 03/26/2020    Past Surgical History:  Procedure Laterality Date   ABDOMINAL HYSTERECTOMY  02/2017   anemia, fibroids   CHOLECYSTECTOMY N/A 05/01/2021   Procedure: LAPAROSCOPIC CHOLECYSTECTOMY;  Surgeon: Oza Blumenthal, MD;  Location: [facility] OR;  Service: General;  Laterality: N/A;   COLONOSCOPY     DILATION AND CURETTAGE OF UTERUS  1987   NASAL SEPTOPLASTY W/ TURBINOPLASTY  03/23/2012   Procedure: NASAL SEPTOPLASTY WITH TURBINATE REDUCTION;  Surgeon: Ammon Bales, MD;  Location: [facility];  Service: ENT;  Laterality: Bilateral;   ROBOTIC ASSISTED TOTAL HYSTERECTOMY WITH SALPINGECTOMY N/A 03/17/2017   Procedure: ROBOTIC ASSISTED TOTAL HYSTERECTOMY WITH SALPINGECTOMY;  Surgeon: Matt Song, MD;  Location: [facility];  Service: Gynecology;  Laterality: N/A;   SINUS ENDO W/FUSION  03/23/2012   Procedure: ENDOSCOPIC SINUS SURGERY WITH FUSION NAVIGATION;  Surgeon: Ammon Bales, MD;  Location: [facility];  Service: ENT;  Laterality: Bilateral;  with fusion scan   UPPER GI ENDOSCOPY     WISDOM TOOTH EXTRACTION      Family History  Problem Relation Age of Onset   Diabetes Mother        type 2   Hypertension Mother    Depression Mother    Mental illness Mother        bi-polar   Pulmonary embolism Mother    Obesity Mother    Sudden death Mother    Other Father        drug addiction   Heart disease Maternal Grandfather    Stroke Maternal Grandfather    Depression Maternal Aunt 46       suicide attempt with pneumonia    Social History   Socioeconomic History   Marital status: Married    Spouse name: Susana Enter   Number of children: 4   Years of education: Not on file   Highest education level: Not on file  Occupational History   Occupation: RN  Tobacco Use   Smoking status: Former    Current packs/day: 0.00     Average packs/day: 1 pack/day for 19.0 years (19.0 ttl pk-yrs)    Types: Cigarettes    Start date: 07/27/1984    Quit date: 07/28/2003    Years since quitting: 20.4   Smokeless tobacco: Never  Substance and Sexual Activity   Alcohol use: Not Currently    Comment: occasionally   Drug use: Yes    Comment: CBD oil - last week 04/29/21   Sexual activity: Yes    Partners: Male    Birth control/protection: None    Comment: lives with husband, stepson 49, son and daughter. no dietary restrictions. eating heart healthy, walking more  Other Topics Concern   Not on file  Social History Narrative   Not on file   Social Drivers of  Health   Financial Resource Strain: Not on file  Food Insecurity: Not on file  Transportation Needs: Not on file  Physical Activity: Not on file  Stress: Not on file  Social Connections: Unknown (12/05/2021)   Received from [facility]   Social Network    Social Network: Not on file  Intimate Partner Violence: Unknown (10/28/2021)   Received from [facility]   HITS    Physically Hurt: Not on file    Insult or Talk Down To: Not on file    Threaten Physical Harm: Not on file    Scream or Curse: Not on file    Outpatient Medications Prior to Visit  Medication Sig Dispense Refill   azelastine  (ASTELIN ) 0.1 % nasal spray Place 1 spray into both nostrils 2 (two) times daily. Use in each nostril as directed 30 mL 1   Dexlansoprazole (DEXILANT) 30 MG capsule DR Take 30 mg by mouth daily.     estradiol  (ESTRACE ) 1 MG tablet Take 1 mg by mouth daily.     estradiol  (ESTRACE ) 1 MG tablet Take 1 tablet (1 mg total) by mouth daily. 95 tablet 4   metFORMIN  (GLUCOPHAGE ) 500 MG tablet Take 1 tablet (500 mg total) by mouth 2 (two) times daily with a meal. 60 tablet 0   ondansetron  (ZOFRAN -ODT) 8 MG disintegrating tablet Take 8 mg by mouth 2 (two) times daily as needed.     potassium chloride  SA (KLOR-CON  M) 20 MEQ tablet TAKE 1 TABLET BY MOUTH  TWICE A DAY 180 tablet 0   propranolol  (INDERAL ) 20 MG tablet TAKE 1 TABLET BY MOUTH THREE TIMES A DAY 270 tablet 2   Semaglutide -Weight Management (WEGOVY ) 1.7 MG/0.75ML SOAJ Inject 1.7 mg into the skin once a week. 3 mL 0   SUMAtriptan  (IMITREX ) 50 MG tablet Take 1 tablet (50 mg total) by mouth every 2 (two) hours as needed for migraine or headache. May repeat in 2 hours if headache persists or recurs. 10 tablet 2   triamterene -hydrochlorothiazide  (MAXZIDE -25) 37.5-25 MG tablet TAKE 1 TABLET BY MOUTH EVERY DAY 90 tablet 0   No facility-administered medications prior to visit.    Allergies  Allergen Reactions   Sulfa Antibiotics Rash   Penicillins Rash    Has patient had a PCN reaction causing immediate rash, facial/tongue/throat swelling, SOB or lightheadedness with hypotension: Unknown Has patient had a PCN reaction causing severe rash involving mucus membranes or skin necrosis: Unknown Has patient had a PCN reaction that required hospitalization: No Has patient had a PCN reaction occurring within the last 10 years: No Childhood allergy  If all of the above answers are "NO", then may proceed with Cephalosporin use.     Review of Systems  Constitutional:  Negative for fever and malaise/fatigue.  HENT:  Positive for congestion, ear pain, sinus pain and tinnitus.   Eyes:  Negative for blurred vision.  Respiratory:  Negative for shortness of breath.   Cardiovascular:  Negative for chest pain, palpitations and leg swelling.  Gastrointestinal:  Negative for abdominal pain, blood in stool and nausea.  Genitourinary:  Negative for dysuria and frequency.  Musculoskeletal:  Negative for falls.  Skin:  Negative for rash.  Neurological:  Negative for dizziness, loss of consciousness and headaches.  Endo/Heme/Allergies:  Negative for environmental allergies.  Psychiatric/Behavioral:  Negative for depression. The patient is not nervous/anxious.        Objective:     Physical  Exam Constitutional:  General: She is not in acute distress.    Appearance: Normal appearance. She is not ill-appearing or toxic-appearing.  HENT:     Head: Normocephalic and atraumatic.     Right Ear: External ear normal.     Left Ear: External ear normal.     Nose: Nose normal.  Eyes:     General:        Right eye: No discharge.        Left eye: No discharge.  Pulmonary:     Effort: Pulmonary effort is normal.  Skin:    Findings: No rash.  Neurological:     Mental Status: She is alert and oriented to person, place, and time.  Psychiatric:        Behavior: Behavior normal.     LMP 03/03/2017 (Exact Date)  Wt Readings from Last 3 Encounters:  12/22/23 212 lb (96.2 kg)  11/16/23 211 lb (95.7 kg)  10/19/23 212 lb (96.2 kg)       Assessment & Plan:  Class 2 severe obesity with serious comorbidity and body mass index (BMI) of 35.0 to 35.9 in adult, unspecified obesity type [facility]) Assessment & Plan: Encouraged DASH or MIND diet, decrease po intake and increase exercise as tolerated. Needs 7-8 hours of sleep nightly. Avoid trans fats, eat small, frequent meals every 4-5 hours with lean proteins, complex carbs and healthy fats. Minimize simple carbs, high fat foods and processed foods    Essential hypertension Assessment & Plan: Well controlled, no changes to meds. Encouraged heart healthy diet such as the DASH diet and exercise as tolerated.     Hyperglycemia Assessment & Plan: hgba1c acceptable, minimize simple carbs. Increase exercise as tolerated.   Hyperlipidemia, mixed Assessment & Plan: Encourage heart healthy diet such as MIND or DASH diet, increase exercise, avoid trans fats, simple carbohydrates and processed foods, consider a krill or fish or flaxseed oil cap daily.     Vitamin D  deficiency Assessment & Plan: Supplement and monitor    Other orders -     Fluticasone  Propionate; Place 2 sprays into both nostrils daily.  Dispense: 16 g; Refill: 6      Assessment and Plan Assessment & Plan Eustachian Tube Dysfunction Intermittent right-sided ear pain, tinnitus, dizziness, congestion, and occasional headaches suggest Eustachian tube dysfunction due to nasal congestion, potentially causing fluid backup in the middle ear. Discussed ear anatomy, physiology, and the impact of dehydration and aging on symptoms. Treatment focuses on alleviating nasal congestion and ensuring adequate hydration. - Recommend Flonase  (fluticasone ) nasal spray daily. - Advise nasal saline irrigation twice daily. - Instruct on Eustachian tube exercises (nose plugging and gentle blowing). - Encourage adequate hydration (60-80 ounces of fluids daily). - Recommend taking a fatty acid supplement and a multivitamin. - Send Flonase  prescription to pharmacy. - Re-evaluate symptoms in 1-2 months.  Hypokalemia Currently on Maxzide  for hypertension, previously prescribed potassium supplements for low potassium levels. She reports irregular intake of potassium supplements and wishes to discontinue them. Denies symptoms of hypokalemia. Plan to check potassium levels to determine if discontinuation is appropriate. - Order serum potassium level. - Advise researching and consuming potassium-rich foods. - Consider discontinuing potassium supplement if levels are adequate.  General Health Maintenance Emphasized the importance of hydration, especially with aging, to prevent dizziness and reduce dementia risk. Explained decreased muscle mass with age affects water  storage, necessitating consistent fluid intake. - Advise drinking 5-10 ounces of water  every 1-2 hours. - Educate on the importance of maintaining hydration to prevent dizziness  and potential long-term cognitive effects.     I discussed the assessment and treatment plan with the patient. The patient was provided an opportunity to ask questions and all were answered. The patient agreed with the plan and demonstrated an  understanding of the instructions.   The patient was advised to call back or seek an in-person evaluation if the symptoms worsen or if the condition fails to improve as anticipated.  Randie Bustle, MD [facility] at [facility] [phone] (phone) [phone] (fax)  [facility]

## 2024-01-06 ENCOUNTER — Other Ambulatory Visit: Payer: Self-pay | Admitting: Family

## 2024-01-06 ENCOUNTER — Encounter: Payer: Self-pay | Admitting: Family Medicine

## 2024-01-06 DIAGNOSIS — E875 Hyperkalemia: Secondary | ICD-10-CM

## 2024-01-06 DIAGNOSIS — E876 Hypokalemia: Secondary | ICD-10-CM

## 2024-01-07 ENCOUNTER — Other Ambulatory Visit (HOSPITAL_BASED_OUTPATIENT_CLINIC_OR_DEPARTMENT_OTHER): Payer: Self-pay

## 2024-01-19 ENCOUNTER — Ambulatory Visit: Admitting: Bariatrics

## 2024-01-19 ENCOUNTER — Encounter: Payer: Self-pay | Admitting: Bariatrics

## 2024-01-19 ENCOUNTER — Other Ambulatory Visit (HOSPITAL_BASED_OUTPATIENT_CLINIC_OR_DEPARTMENT_OTHER): Payer: Self-pay

## 2024-01-19 VITALS — BP 119/79 | HR 66 | Temp 97.8°F | Ht 67.0 in | Wt 210.0 lb

## 2024-01-19 DIAGNOSIS — Z6832 Body mass index (BMI) 32.0-32.9, adult: Secondary | ICD-10-CM | POA: Diagnosis not present

## 2024-01-19 DIAGNOSIS — I10 Essential (primary) hypertension: Secondary | ICD-10-CM

## 2024-01-19 DIAGNOSIS — E6609 Other obesity due to excess calories: Secondary | ICD-10-CM

## 2024-01-19 DIAGNOSIS — E669 Obesity, unspecified: Secondary | ICD-10-CM

## 2024-01-19 DIAGNOSIS — R632 Polyphagia: Secondary | ICD-10-CM | POA: Diagnosis not present

## 2024-01-19 MED ORDER — WEGOVY 1.7 MG/0.75ML ~~LOC~~ SOAJ
1.7000 mg | SUBCUTANEOUS | 0 refills | Status: DC
  Filled 2024-01-19: qty 3, 28d supply, fill #0

## 2024-01-19 NOTE — Progress Notes (Signed)
 WEIGHT SUMMARY AND BIOMETRICS  Weight Lost Since Last Visit: 1lb  Weight Gained Since Last Visit: 0   Vitals Temp: 97.8 F (36.6 C) BP: 119/79 Pulse Rate: 66 SpO2: 99 %   Anthropometric Measurements Height: 5' 7 (1.702 m) Weight: 210 lb (95.3 kg) BMI (Calculated): 32.88 Weight at Last Visit: 212lb Weight Lost Since Last Visit: 1lb Weight Gained Since Last Visit: 0 Starting Weight: 224lb Total Weight Loss (lbs): 13 lb (5.897 kg)   Body Composition  Body Fat %: 36.6 % Fat Mass (lbs): 77 lbs Muscle Mass (lbs): 126.4 lbs Total Body Water  (lbs): 85.6 lbs Visceral Fat Rating : 10   Other Clinical Data Fasting: no Labs: no Today's Visit #: 22 Starting Date: 03/11/22    OBESITY Connie Blanchard is here to discuss her progress with her obesity treatment plan along with follow-up of her obesity related diagnoses.    Nutrition Plan: the Category 2 plan - 75% adherence.  Current exercise: walking and exercise classes.  Interim History:  She is down 1 lb since her last visit. She is listening to her body.  Eating all of the food on the plan., Protein intake is as prescribed, Meeting calorie goals., and Water  intake is adequate.   Pharmacotherapy: Connie Blanchard is on Wegovy  1.7 SQ weekly Adverse side effects: slight dizziness Hunger is moderately controlled.  Cravings are moderately controlled.  Assessment/Plan:   Connie Blanchard endorses excessive hunger.  Medication(s): Wegovy  Effects of medication:  moderately controlled. Cravings are moderately controlled.   Plan: Medication(s): Wegovy  1.7 SQ weekly Will increase water , protein and fiber to help assuage hunger.  Will minimize foods that have a high glucose index/load to minimize reactive hypoglycemia.  She will continue to do both cardio and resistance and will consider a protein shake after workouts  if needed for hunger.   Hypertension Hypertension well controlled.  Medication(s): Maxzide  37.5/25 mg daily   BP Readings from Last 3 Encounters:  01/19/24 119/79  12/22/23 126/83  11/16/23 128/84   Lab Results  Component Value Date   CREATININE 0.83 10/19/2023   CREATININE 0.90 06/29/2022   CREATININE 0.83 03/11/2022   Lab Results  Component Value Date   GFR 72.91 06/29/2022   GFR 58.80 (L) 12/25/2021   GFR 72.02 07/01/2020    Plan: Continue all antihypertensives at current dosages. No added salt. Will keep sodium content to 1,500 mg or less per day.      Generalized Obesity: Current BMI BMI (Calculated): 32.88   Pharmacotherapy Plan Continue and refill  Wegovy  1.7 SQ weekly  Connie Blanchard is currently in the action stage of change. As such, her goal is to continue with weight loss efforts.  She has agreed to the Category 2 plan.  Exercise goals: For substantial health benefits, adults should do at least 150 minutes (2 hours and 30 minutes) a week of moderate-intensity, or 75 minutes (1  hour and 15 minutes) a week of vigorous-intensity aerobic physical activity, or an equivalent combination of moderate- and vigorous-intensity aerobic activity. Aerobic activity should be performed in episodes of at least 10 minutes, and preferably, it should be spread throughout the week.  Behavioral modification strategies: increasing lean protein intake, no meal skipping, meal planning , increase water  intake, better snacking choices, planning for success, increasing vegetables, avoiding temptations, keep healthy foods in the home, weigh protein portions, pack lunch for work, mindful eating, and eat on smaller plate.  Connie Blanchard has agreed to follow-up with our clinic in 4 weeks.  She will do labs at her next visit.   Objective:   VITALS: Per patient if applicable, see vitals. GENERAL: Alert and in no acute distress. CARDIOPULMONARY: No increased WOB. Speaking in clear sentences.  PSYCH:  Pleasant and cooperative. Speech normal rate and rhythm. Affect is appropriate. Insight and judgement are appropriate. Attention is focused, linear, and appropriate.  NEURO: Oriented as arrived to appointment on time with no prompting.   Attestation Statements:   This was prepared with the assistance of Engineer, civil (consulting).  Occasional wrong-word or sound-a-like substitutions may have occurred due to the inherent limitations of voice recognition   Clayborne Daring, DO

## 2024-01-31 ENCOUNTER — Other Ambulatory Visit (INDEPENDENT_AMBULATORY_CARE_PROVIDER_SITE_OTHER)

## 2024-01-31 DIAGNOSIS — E876 Hypokalemia: Secondary | ICD-10-CM | POA: Diagnosis not present

## 2024-01-31 LAB — BASIC METABOLIC PANEL WITH GFR
BUN: 8 mg/dL (ref 6–23)
CO2: 27 meq/L (ref 19–32)
Calcium: 8.9 mg/dL (ref 8.4–10.5)
Chloride: 101 meq/L (ref 96–112)
Creatinine, Ser: 0.81 mg/dL (ref 0.40–1.20)
GFR: 81.82 mL/min (ref >60.00)
Glucose, Bld: 96 mg/dL (ref 70–99)
Potassium: 3.9 meq/L (ref 3.5–5.1)
Sodium: 138 meq/L (ref 135–145)

## 2024-02-01 ENCOUNTER — Other Ambulatory Visit (HOSPITAL_BASED_OUTPATIENT_CLINIC_OR_DEPARTMENT_OTHER): Payer: Self-pay

## 2024-02-01 ENCOUNTER — Ambulatory Visit: Payer: Self-pay | Admitting: Family

## 2024-02-16 ENCOUNTER — Ambulatory Visit: Admitting: Bariatrics

## 2024-02-16 ENCOUNTER — Other Ambulatory Visit (HOSPITAL_BASED_OUTPATIENT_CLINIC_OR_DEPARTMENT_OTHER): Payer: Self-pay

## 2024-02-16 ENCOUNTER — Encounter: Payer: Self-pay | Admitting: Bariatrics

## 2024-02-16 VITALS — BP 107/71 | HR 56 | Temp 98.0°F | Ht 67.0 in | Wt 214.0 lb

## 2024-02-16 DIAGNOSIS — E559 Vitamin D deficiency, unspecified: Secondary | ICD-10-CM | POA: Diagnosis not present

## 2024-02-16 DIAGNOSIS — E785 Hyperlipidemia, unspecified: Secondary | ICD-10-CM

## 2024-02-16 DIAGNOSIS — Z6833 Body mass index (BMI) 33.0-33.9, adult: Secondary | ICD-10-CM

## 2024-02-16 DIAGNOSIS — R739 Hyperglycemia, unspecified: Secondary | ICD-10-CM | POA: Diagnosis not present

## 2024-02-16 DIAGNOSIS — R632 Polyphagia: Secondary | ICD-10-CM | POA: Diagnosis not present

## 2024-02-16 DIAGNOSIS — E669 Obesity, unspecified: Secondary | ICD-10-CM

## 2024-02-16 DIAGNOSIS — E66811 Obesity, class 1: Secondary | ICD-10-CM

## 2024-02-16 DIAGNOSIS — E782 Mixed hyperlipidemia: Secondary | ICD-10-CM

## 2024-02-16 MED ORDER — WEGOVY 1 MG/0.5ML ~~LOC~~ SOAJ
1.0000 mg | SUBCUTANEOUS | 0 refills | Status: DC
  Filled 2024-02-16: qty 2, 28d supply, fill #0

## 2024-02-16 MED ORDER — METFORMIN HCL 500 MG PO TABS
500.0000 mg | ORAL_TABLET | Freq: Two times a day (BID) | ORAL | 0 refills | Status: DC
  Filled 2024-02-16: qty 60, 30d supply, fill #0

## 2024-02-16 NOTE — Progress Notes (Signed)
 WEIGHT SUMMARY AND BIOMETRICS  Weight Lost Since Last Visit: 0  Weight Gained Since Last Visit: 4lb   Vitals Temp: 98 F (36.7 C) BP: 107/71 Pulse Rate: (!) 56 SpO2: 99 %   Anthropometric Measurements Height: 5' 7 (1.702 m) Weight: 214 lb (97.1 kg) BMI (Calculated): 33.51 Weight at Last Visit: 210lb Weight Lost Since Last Visit: 0 Weight Gained Since Last Visit: 4lb Starting Weight: 224lb Total Weight Loss (lbs): 9 lb (4.082 kg)   Body Composition  Body Fat %: 34.4 % Fat Mass (lbs): 73.6 lbs Muscle Mass (lbs): 133.4 lbs Total Body Water  (lbs): 89.6 lbs Visceral Fat Rating : 9   Other Clinical Data Fasting: yes Labs: yes Today's Visit #: 23 Starting Date: 03/11/22    OBESITY Connie Blanchard is here to discuss her progress with her obesity treatment plan along with follow-up of her obesity related diagnoses.    Nutrition Plan: the Category 2 plan - 25% adherence.  Current exercise: none  Interim History:  She is up 4 pounds since her last visit.  She had some issues with her insurance coverage and was not able to get her Wegovy  for about a month but this has been resolved and now she wants to continue the Wegovy .  She has been walking Eating all of the food on the plan., Protein intake is as prescribed, Is not skipping meals, Water  intake is adequate., and Reports polyphagia   Pharmacotherapy: Connie Blanchard is on Wegovy  1.0 mg SQ weekly Adverse side effects: None Hunger is moderately controlled.  Cravings are moderately controlled.  Assessment/Plan:   Connie Blanchard endorses excessive hunger.  Medication(s): Wegovy  1 mg  Effects of medication:  moderately controlled. Cravings are moderately controlled.   Plan: Medication(s): Wegovy  1.0 mg SQ weekly Will increase water , protein and fiber to help assuage hunger.  Will minimize foods that have a  high glucose index/load to minimize reactive hypoglycemia.  She will continue her exercise regimen.   Hyperglycemia:  She has a history of hyperglycemia in the past.   Plan: Will check a hemoglobin A1c and insulin  level.   Connie Blanchard  Deficiency Connie Blanchard  is not at goal of 50.  Most recent Connie Blanchard  level was 37.9. She is on  prescription ergocalciferol  50,000 IU weekly. Lab Results  Component Value Date   VD25OH 37.9 10/19/2023   VD25OH 42.8 03/11/2022   VD25OH 35.2 03/26/2020    Plan: Continue prescription Connie Blanchard  50,000 IU weekly.  Check Connie Blanchard  level today.  Hyperlipidemia LDL is not at goal. Medication(s): none, working on diet and exercise Cardiovascular risk factors: dyslipidemia, obesity (BMI >= 30 kg/m2), and sedentary lifestyle  Lab Results  Component Value Date   CHOL 193 10/19/2023   HDL 62 10/19/2023   LDLCALC 109 (H) 10/19/2023   LDLDIRECT 83.0 12/25/2021   TRIG 122 10/19/2023   CHOLHDL 3 06/29/2022   Lab  Results  Component Value Date   ALT 11 10/19/2023   AST 13 10/19/2023   ALKPHOS 69 10/19/2023   BILITOT 0.3 10/19/2023   The 10-year ASCVD risk score (Arnett DK, et al., 2019) is: 1.6%   Values used to calculate the score:     Age: 53 years     Clincally relevant sex: Female     Is Non-Hispanic African American: No     Diabetic: No     Tobacco smoker: No     Systolic Blood Pressure: 107 mmHg     Is BP treated: Yes     HDL Cholesterol: 62 mg/dL     Total Cholesterol: 193 mg/dL  Plan:  Will avoid all trans fats.  Will read labels Will minimize saturated fats except the following: low fat meats in moderation, diary, and limited dark chocolate.  Increase Omega 3 in foods, and consider an Omega 3 supplement.  Continue her walking and will increase over time in addition to adding in some resistance training.  Labs done today (CMP, Lipids, HgbA1c, insulin , Connie Blanchard , B 12).    Generalized Obesity: Current BMI BMI (Calculated): 33.51    Pharmacotherapy Plan Continue and refill  Wegovy  1.0 mg SQ weekly  Connie Blanchard is currently in the action stage of change. As such, her goal is to continue with weight loss efforts.  She has agreed to the Category 2 plan.  Exercise goals: For substantial health benefits, adults should do at least 150 minutes (2 hours and 30 minutes) a week of moderate-intensity, or 75 minutes (1 hour and 15 minutes) a week of vigorous-intensity aerobic physical activity, or an equivalent combination of moderate- and vigorous-intensity aerobic activity. Aerobic activity should be performed in episodes of at least 10 minutes, and preferably, it should be spread throughout the week.  Behavioral modification strategies: increasing lean protein intake, decreasing simple carbohydrates , no meal skipping, meal planning , planning for success, avoiding temptations, keep healthy foods in the home, weigh protein portions, measure portion sizes, pack lunch for work, and mindful eating.  Connie Blanchard has agreed to follow-up with our clinic in 4 weeks.      Objective:   VITALS: Per patient if applicable, see vitals. GENERAL: Alert and in no acute distress. CARDIOPULMONARY: No increased WOB. Speaking in clear sentences.  PSYCH: Pleasant and cooperative. Speech normal rate and rhythm. Affect is appropriate. Insight and judgement are appropriate. Attention is focused, linear, and appropriate.  NEURO: Oriented as arrived to appointment on time with no prompting.   Attestation Statements:   This was prepared with the assistance of Engineer, civil (consulting).  Occasional wrong-word or sound-a-like substitutions may have occurred due to the inherent limitations of voice recognition   Clayborne Daring, DO

## 2024-02-17 LAB — LIPID PANEL WITH LDL/HDL RATIO
Cholesterol, Total: 216 mg/dL — ABNORMAL HIGH (ref 100–199)
HDL: 69 mg/dL (ref >39)
LDL Chol Calc (NIH): 119 mg/dL — ABNORMAL HIGH (ref 0–99)
LDL/HDL Ratio: 1.7 ratio (ref 0.0–3.2)
Triglycerides: 160 mg/dL — ABNORMAL HIGH (ref 0–149)
VLDL Cholesterol Cal: 28 mg/dL (ref 5–40)

## 2024-02-17 LAB — VITAMIN D 25 HYDROXY (VIT D DEFICIENCY, FRACTURES): Vit D, 25-Hydroxy: 39.2 ng/mL (ref 30.0–100.0)

## 2024-02-17 LAB — COMPREHENSIVE METABOLIC PANEL WITH GFR
ALT: 10 IU/L (ref 0–32)
AST: 12 IU/L (ref 0–40)
Albumin: 3.9 g/dL (ref 3.8–4.9)
Alkaline Phosphatase: 63 IU/L (ref 44–121)
BUN/Creatinine Ratio: 11 (ref 9–23)
BUN: 10 mg/dL (ref 6–24)
Bilirubin Total: 0.3 mg/dL (ref 0.0–1.2)
CO2: 22 mmol/L (ref 20–29)
Calcium: 9 mg/dL (ref 8.7–10.2)
Chloride: 100 mmol/L (ref 96–106)
Creatinine, Ser: 0.88 mg/dL (ref 0.57–1.00)
Globulin, Total: 3.3 g/dL (ref 1.5–4.5)
Glucose: 103 mg/dL — ABNORMAL HIGH (ref 70–99)
Potassium: 4.1 mmol/L (ref 3.5–5.2)
Sodium: 139 mmol/L (ref 134–144)
Total Protein: 7.2 g/dL (ref 6.0–8.5)
eGFR: 78 mL/min/1.73 (ref >59)

## 2024-02-17 LAB — INSULIN, RANDOM: INSULIN: 16.9 u[IU]/mL (ref 2.6–24.9)

## 2024-02-17 LAB — HEMOGLOBIN A1C
Est. average glucose Bld gHb Est-mCnc: 105 mg/dL
Hgb A1c MFr Bld: 5.3 % (ref 4.8–5.6)

## 2024-03-15 ENCOUNTER — Encounter: Payer: Self-pay | Admitting: Bariatrics

## 2024-03-15 ENCOUNTER — Other Ambulatory Visit (HOSPITAL_BASED_OUTPATIENT_CLINIC_OR_DEPARTMENT_OTHER): Payer: Self-pay

## 2024-03-15 ENCOUNTER — Ambulatory Visit: Admitting: Bariatrics

## 2024-03-15 ENCOUNTER — Other Ambulatory Visit (HOSPITAL_COMMUNITY): Payer: Self-pay

## 2024-03-15 ENCOUNTER — Other Ambulatory Visit: Payer: Self-pay

## 2024-03-15 VITALS — BP 128/84 | HR 68 | Temp 97.8°F | Ht 67.0 in | Wt 217.0 lb

## 2024-03-15 DIAGNOSIS — R739 Hyperglycemia, unspecified: Secondary | ICD-10-CM

## 2024-03-15 DIAGNOSIS — E669 Obesity, unspecified: Secondary | ICD-10-CM | POA: Diagnosis not present

## 2024-03-15 DIAGNOSIS — R632 Polyphagia: Secondary | ICD-10-CM | POA: Diagnosis not present

## 2024-03-15 DIAGNOSIS — Z6833 Body mass index (BMI) 33.0-33.9, adult: Secondary | ICD-10-CM | POA: Diagnosis not present

## 2024-03-15 DIAGNOSIS — E6609 Other obesity due to excess calories: Secondary | ICD-10-CM

## 2024-03-15 MED ORDER — WEGOVY 1.7 MG/0.75ML ~~LOC~~ SOAJ
SUBCUTANEOUS | 0 refills | Status: DC
  Filled 2024-03-15: qty 3, 28d supply, fill #0

## 2024-03-15 MED ORDER — METFORMIN HCL 500 MG PO TABS
500.0000 mg | ORAL_TABLET | Freq: Two times a day (BID) | ORAL | 0 refills | Status: DC
  Filled 2024-03-15 (×2): qty 60, 30d supply, fill #0

## 2024-03-15 NOTE — Progress Notes (Signed)
 WEIGHT SUMMARY AND BIOMETRICS  Weight Lost Since Last Visit: 0  Weight Gained Since Last Visit: 3lb   Vitals Temp: 97.8 F (36.6 C) BP: 128/84 Pulse Rate: 68 SpO2: 99 %   Anthropometric Measurements Height: 5' 7 (1.702 m) Weight: 217 lb (98.4 kg) BMI (Calculated): 33.98 Weight at Last Visit: 214lb Weight Lost Since Last Visit: 0 Weight Gained Since Last Visit: 3lb Starting Weight: 224lb Total Weight Loss (lbs): 6 lb (2.722 kg)   Body Composition  Body Fat %: 37.3 % Fat Mass (lbs): 81 lbs Muscle Mass (lbs): 129.6 lbs Total Body Water  (lbs): 91.2 lbs Visceral Fat Rating : 10   Other Clinical Data Fasting: no Labs: no Today's Visit #: 24 Starting Date: 03/11/22    OBESITY Jakalyn is here to discuss her progress with her obesity treatment plan along with follow-up of her obesity related diagnoses.    Nutrition Plan: the Category 2 plan - 75-80% adherence.  Current exercise: none  Interim History:  She is up 3 lbs since her last visit.  Eating all of the food on the plan., Protein intake is as prescribed, Is not skipping meals, and Water  intake is adequate.   Pharmacotherapy: Ronique is on Wegovy  1.0 mg SQ weekly and Metformin  500 mg twice daily with meals Adverse side effects: None Hunger is moderately controlled.  Cravings are moderately controlled.  Assessment/Plan:   Loxley Cibrian endorses excessive hunger. Her food noise is down.  Medication(s): Wegovy  1.0. She was up to 1.7 mg, but came down after she had to stop for surgery.  Effects of medication (appetite):  moderately controlled. Cravings are moderately controlled.   Plan: Medication(s): Wegovy  1.7 SQ weekly Will increase water , protein and fiber to help assuage hunger.  Will minimize foods that have a high glucose index/load to minimize reactive hypoglycemia.   Discussed the importance of habit over motivation.  Will cook at home.  Will continue meal planning.   Hyperglycemia:   Skyanne has had elevated fasting insulin  readings. Goal is HgbA1c < 5.7, fasting insulin  at l0 or less, and preferably at 5.  She reports polyphagia. Medication(s): Wegovy  Lab Results  Component Value Date   HGBA1C 5.3 02/16/2024   Lab Results  Component Value Date   INSULIN  16.9 02/16/2024   INSULIN  15.3 10/19/2023   INSULIN  18.0 03/11/2022   INSULIN  10.8 03/26/2020   INSULIN  23.4 03/16/2019    Plan Medication(s): Wegovy  1.7 SQ weekly Will work on the agreed upon plan. Will minimize refined carbohydrates ( sweets and starches), and focus more on complex carbohydrates.  Increase the micronutrients found in leafy greens, which include magnesium , polyphenols, and vitamin C which have been postulated to help with insulin  sensitivity. Minimize fast food and cook more meals at home.     Generalized Obesity: Current BMI BMI (Calculated): 33.98   Pharmacotherapy Plan Continue and increase dose  Wegovy   1.7 SQ weekly  Jassmine is currently in the action stage of change. As such, her goal is to continue with weight loss efforts.  She has agreed to the Category 2 plan.  Exercise goals: All adults should avoid inactivity. Some physical activity is better than none, and adults who participate in any amount of physical activity gain some health benefits.  Behavioral modification strategies: increasing lean protein intake, decreasing simple carbohydrates , no meal skipping, decrease eating out, meal planning , increase water  intake, better snacking choices, planning for success, and increasing vegetables.  Linh has agreed to follow-up with our clinic in 4 weeks.     Objective:   VITALS: Per patient if applicable, see vitals. GENERAL: Alert and in no acute distress. CARDIOPULMONARY: No increased WOB. Speaking in clear sentences.  PSYCH: Pleasant and  cooperative. Speech normal rate and rhythm. Affect is appropriate. Insight and judgement are appropriate. Attention is focused, linear, and appropriate.  NEURO: Oriented as arrived to appointment on time with no prompting.   Attestation Statements:   This was prepared with the assistance of Engineer, civil (consulting).  Occasional wrong-word or sound-a-like substitutions may have occurred due to the inherent limitations of voice recognition   Clayborne Daring, DO

## 2024-03-24 ENCOUNTER — Other Ambulatory Visit: Payer: Self-pay | Admitting: Family Medicine

## 2024-04-18 ENCOUNTER — Ambulatory Visit: Admitting: Bariatrics

## 2024-04-18 ENCOUNTER — Encounter: Payer: Self-pay | Admitting: Bariatrics

## 2024-04-18 ENCOUNTER — Other Ambulatory Visit (HOSPITAL_BASED_OUTPATIENT_CLINIC_OR_DEPARTMENT_OTHER): Payer: Self-pay

## 2024-04-18 VITALS — BP 109/75 | HR 69 | Temp 97.7°F | Ht 67.0 in | Wt 216.0 lb

## 2024-04-18 DIAGNOSIS — R632 Polyphagia: Secondary | ICD-10-CM

## 2024-04-18 DIAGNOSIS — E6609 Other obesity due to excess calories: Secondary | ICD-10-CM

## 2024-04-18 DIAGNOSIS — Z6833 Body mass index (BMI) 33.0-33.9, adult: Secondary | ICD-10-CM

## 2024-04-18 DIAGNOSIS — E669 Obesity, unspecified: Secondary | ICD-10-CM | POA: Diagnosis not present

## 2024-04-18 DIAGNOSIS — E88819 Insulin resistance, unspecified: Secondary | ICD-10-CM

## 2024-04-18 MED ORDER — WEGOVY 1.7 MG/0.75ML ~~LOC~~ SOAJ
1.7000 mg | SUBCUTANEOUS | 0 refills | Status: DC
  Filled 2024-04-18: qty 3, 28d supply, fill #0

## 2024-04-18 MED ORDER — METFORMIN HCL 500 MG PO TABS
500.0000 mg | ORAL_TABLET | Freq: Two times a day (BID) | ORAL | 0 refills | Status: DC
  Filled 2024-04-18: qty 60, 30d supply, fill #0

## 2024-04-18 NOTE — Progress Notes (Signed)
 WEIGHT SUMMARY AND BIOMETRICS  Weight Lost Since Last Visit: 1lb  Weight Gained Since Last Visit: 0   Vitals Temp: 97.7 F (36.5 C) BP: 109/75 Pulse Rate: 69 SpO2: 98 %   Anthropometric Measurements Height: 5' 7 (1.702 m) Weight: 216 lb (98 kg) BMI (Calculated): 33.82 Weight at Last Visit: 217lb Weight Lost Since Last Visit: 1lb Weight Gained Since Last Visit: 0 Starting Weight: 224lb Total Weight Loss (lbs): 7 lb (3.175 kg)   Body Composition  Body Fat %: 34.9 % Fat Mass (lbs): 75.4 lbs Muscle Mass (lbs): 133.6 lbs Total Body Water  (lbs): 89.4 lbs Visceral Fat Rating : 9   Other Clinical Data Fasting: no Labs: no Today's Visit #: 25 Starting Date: 03/11/22    OBESITY Connie Blanchard is here to discuss her progress with her obesity treatment plan along with follow-up of her obesity related diagnoses.    Nutrition Plan: the Category 2 plan - 60% adherence.  Current exercise: walking  Interim History:  She is down 1 lb since her last visit.  Eating all of the food on the plan., Protein intake is as prescribed, and Water  intake is adequate.   Pharmacotherapy: Connie Blanchard is on Wegovy  1.7 SQ weekly and Metformin  500 mg twice daily with meals Adverse side effects: None Hunger is moderately controlled.  Cravings are moderately controlled.  Assessment/Plan:   Connie Blanchard endorses excessive hunger.  Medication(s): Wegovy  Effects of medication:  moderately controlled. Cravings are moderately controlled.   Plan: Medication(s): Wegovy  1.7 SQ weekly and Metformin  500 mg twice daily with meals Will increase water , protein and fiber to help assuage hunger.  Will minimize foods that have a high glucose index/load to minimize reactive hypoglycemia.   Insulin  Resistance Connie Blanchard has had elevated fasting insulin  readings. Goal is HgbA1c < 5.7,  fasting insulin  at l0 or less, and preferably at 5.  She denies polyphagia when taking her medications. Medication(s): Wegovy  and Metformin   Lab Results  Component Value Date   HGBA1C 5.3 02/16/2024   Lab Results  Component Value Date   INSULIN  16.9 02/16/2024   INSULIN  15.3 10/19/2023   INSULIN  18.0 03/11/2022   INSULIN  10.8 03/26/2020   INSULIN  23.4 03/16/2019    Plan Medication(s): Wegovy  1.7 SQ weekly and Metformin  500 mg twice daily with meals Will work on the agreed upon plan. Will minimize refined carbohydrates ( sweets and starches), and focus more on complex carbohydrates.  Increase the micronutrients found in leafy greens, which include magnesium , polyphenols, and vitamin C which have been postulated to help with insulin  sensitivity. Minimize fast food and cook more meals at home.  Increase fiber to 25 to 30 grams daily.  Will keep her protein and water  intake high.     Generalized Obesity: Current BMI BMI (Calculated): 33.82   Pharmacotherapy Plan Continue and refill  Wegovy  1.7 SQ weekly and Metformin  500 mg twice  daily with meals  Connie Blanchard is currently in the action stage of change. As such, her goal is to continue with weight loss efforts.  She has agreed to the Category 2 plan.  Exercise goals: All adults should avoid inactivity. Some physical activity is better than none, and adults who participate in any amount of physical activity gain some health benefits.  Behavioral modification strategies: no meal skipping, meal planning , increase water  intake, better snacking choices, planning for success, increasing fiber rich foods, avoiding temptations, keep healthy foods in the home, weigh protein portions, measure portion sizes, and mindful eating.  Connie Blanchard has agreed to follow-up with our clinic in 4 weeks.    Objective:   VITALS: Per patient if applicable, see vitals. GENERAL: Alert and in no acute distress. CARDIOPULMONARY: No increased WOB. Speaking in  clear sentences.  PSYCH: Pleasant and cooperative. Speech normal rate and rhythm. Affect is appropriate. Insight and judgement are appropriate. Attention is focused, linear, and appropriate.  NEURO: Oriented as arrived to appointment on time with no prompting.   Attestation Statements:   This was prepared with the assistance of Engineer, civil (consulting).  Occasional wrong-word or sound-a-like substitutions may have occurred due to the inherent limitations of voice recognition   Clayborne Daring, DO

## 2024-04-30 NOTE — Assessment & Plan Note (Signed)
 Supplement and monitor

## 2024-04-30 NOTE — Assessment & Plan Note (Signed)
 Encourage heart healthy diet such as MIND or DASH diet, increase exercise, avoid trans fats, simple carbohydrates and processed foods, consider a krill or fish or flaxseed oil cap daily.

## 2024-04-30 NOTE — Assessment & Plan Note (Signed)
 Well controlled, no changes to meds. Encouraged heart healthy diet such as the DASH diet and exercise as tolerated.

## 2024-04-30 NOTE — Assessment & Plan Note (Signed)
 hgba1c acceptable, minimize simple carbs. Increase exercise as tolerated.

## 2024-04-30 NOTE — Progress Notes (Signed)
 Subjective:    Patient ID: Connie Blanchard, female    DOB: 06-25-1971, 53 y.o.   MRN: [id number]  No chief complaint on file.   HPI Discussed the use of AI scribe software for clinical note transcription with the patient, who gave verbal consent to proceed.  History of Present Illness     Past Medical History:  Diagnosis Date   Acute bronchitis 06/24/2012   Acute bronchitis with asthma 06/24/2012   Allergic state 10/15/2013   Anemia 08/25/2011   Anxiety    Anxiety    not current   At risk for heart disease 04/01/2020   Back pain 10/15/2013   Back pain    Cervical disc disease 04/22/2015   Chronic sinusitis 02/2012   current runny nose of clear drainage   Colon cancer screening 07/29/2011   Sees Dr Ebbie of dermatology in [facility] Dr Sarrah of GYN for paps and [facility] and denies any concerns   Constipation    Dental crowns present    Depression    Depression with anxiety    in the past    Essential hypertension 10/06/2015   Gallbladder polyp 08/06/2020   GERD (gastroesophageal reflux disease)    H/O gestational diabetes mellitus, not currently pregnant 07/29/2011   h/o    Headache    history of migraines   History of blood transfusion    History of blood transfusion    History of gestational diabetes mellitus (GDM) 03/26/2020   History of hiatal hernia    Hyperglycemia 05/25/2017   Hyperlipidemia, mixed 10/06/2015   Hypertension    Insomnia    Insulin  resistance 06/29/2018   Iron deficiency anemia    Joint pain    Laryngopharyngeal reflux (LPR) 02/2012   current runny nose of clear drainage   Left lower quadrant pain 08/06/2020   Left shoulder pain 05/26/2017   Leg edema    Nausea 10/15/2013   Obese    Obesity    Other fatigue 10/12/2017   Palpitation    PCOS (polycystic ovarian syndrome)    Pedal edema 10/06/2015   Periumbilical pain 08/06/2020   Pharyngitis 05/07/2019   Postoperative state 03/17/2017   Right shoulder pain  05/25/2017   Right upper quadrant pain 08/06/2020   Shortness of breath on exertion 10/12/2017   Vitamin D  deficiency 03/26/2020    Past Surgical History:  Procedure Laterality Date   ABDOMINAL HYSTERECTOMY  02/2017   anemia, fibroids   CHOLECYSTECTOMY N/A 05/01/2021   Procedure: LAPAROSCOPIC CHOLECYSTECTOMY;  Surgeon: Vernetta Berg, MD;  Location: [facility];  Service: General;  Laterality: N/A;   COLONOSCOPY     DILATION AND CURETTAGE OF UTERUS  1987   NASAL SEPTOPLASTY W/ TURBINOPLASTY  03/23/2012   Procedure: NASAL SEPTOPLASTY WITH TURBINATE REDUCTION;  Surgeon: Alm Bouche, MD;  Location: [facility];  Service: ENT;  Laterality: Bilateral;   ROBOTIC ASSISTED TOTAL HYSTERECTOMY WITH SALPINGECTOMY N/A 03/17/2017   Procedure: ROBOTIC ASSISTED TOTAL HYSTERECTOMY WITH SALPINGECTOMY;  Surgeon: Sarrah Browning, MD;  Location: [facility];  Service: Gynecology;  Laterality: N/A;   SINUS ENDO W/FUSION  03/23/2012   Procedure: ENDOSCOPIC SINUS SURGERY WITH FUSION NAVIGATION;  Surgeon: Alm Bouche, MD;  Location: [facility];  Service: ENT;  Laterality: Bilateral;  with fusion scan   UPPER GI ENDOSCOPY     WISDOM TOOTH EXTRACTION      Family History  Problem Relation Age of Onset   Diabetes Mother        type  2   Hypertension Mother    Depression Mother    Mental illness Mother        bi-polar   Pulmonary embolism Mother    Obesity Mother    Sudden death Mother    Other Father        drug addiction   Heart disease Maternal Grandfather    Stroke Maternal Grandfather    Depression Maternal Aunt 55       suicide attempt with pneumonia    Social History   Socioeconomic History   Marital status: Married    Spouse name: Reyes   Number of children: 4   Years of education: Not on file   Highest education level: Not on file  Occupational History   Occupation: RN  Tobacco Use   Smoking status: Former    Current packs/day: 0.00    Average  packs/day: 1 pack/day for 19.0 years (19.0 ttl pk-yrs)    Types: Cigarettes    Start date: 07/27/1984    Quit date: 07/28/2003    Years since quitting: 20.7   Smokeless tobacco: Never  Substance and Sexual Activity   Alcohol use: Not Currently    Comment: occasionally   Drug use: Yes    Comment: CBD oil - last week 04/29/21   Sexual activity: Yes    Partners: Male    Birth control/protection: None    Comment: lives with husband, stepson 28, son and daughter. no dietary restrictions. eating heart healthy, walking more  Other Topics Concern   Not on file  Social History Narrative   Not on file   Social Drivers of Health   Financial Resource Strain: Not on file  Food Insecurity: Not on file  Transportation Needs: Not on file  Physical Activity: Not on file  Stress: Not on file  Social Connections: Unknown (12/05/2021)   [facility] from [facility]   Social Network    Social Network: Not on file  Intimate Partner Violence: Unknown (10/28/2021)   Received from [facility]   HITS    Physically Hurt: Not on file    Insult or Talk Down To: Not on file    Threaten Physical Harm: Not on file    Scream or Curse: Not on file    Outpatient Medications Prior to Visit  Medication Sig Dispense Refill   azelastine  (ASTELIN ) 0.1 % nasal spray Place 1 spray into both nostrils 2 (two) times daily. Use in each nostril as directed 30 mL 1   Dexlansoprazole (DEXILANT) 30 MG capsule DR Take 30 mg by mouth daily.     estradiol  (ESTRACE ) 1 MG tablet Take 1 mg by mouth daily.     estradiol  (ESTRACE ) 1 MG tablet Take 1 tablet (1 mg total) by mouth daily. 95 tablet 4   fluticasone  (FLONASE ) 50 MCG/ACT nasal spray Place 2 sprays into both nostrils daily. 16 g 6   metFORMIN  (GLUCOPHAGE ) 500 MG tablet Take 1 tablet (500 mg total) by mouth 2 (two) times daily with a meal. 60 tablet 0   ondansetron  (ZOFRAN -ODT) 8 MG disintegrating tablet Take 8 mg by mouth 2 (two) times daily as needed.     potassium  chloride SA (KLOR-CON  M) 20 MEQ tablet TAKE 1 TABLET BY MOUTH TWICE A DAY 180 tablet 0   propranolol  (INDERAL ) 20 MG tablet TAKE 1 TABLET BY MOUTH THREE TIMES A DAY 270 tablet 2   semaglutide -weight management (WEGOVY ) 1.7 MG/0.75ML SOAJ SQ injection Inject 1.7 mg into the skin once a week.  9 mL 0   SUMAtriptan  (IMITREX ) 50 MG tablet Take 1 tablet (50 mg total) by mouth every 2 (two) hours as needed for migraine or headache. May repeat in 2 hours if headache persists or recurs. 10 tablet 2   triamterene -hydrochlorothiazide  (MAXZIDE -25) 37.5-25 MG tablet TAKE 1 TABLET BY MOUTH EVERY DAY 90 tablet 1   No facility-administered medications prior to visit.    Allergies  Allergen Reactions   Sulfa Antibiotics Rash   Penicillins Rash    Has patient had a PCN reaction causing immediate rash, facial/tongue/throat swelling, SOB or lightheadedness with hypotension: Unknown Has patient had a PCN reaction causing severe rash involving mucus membranes or skin necrosis: Unknown Has patient had a PCN reaction that required hospitalization: No Has patient had a PCN reaction occurring within the last 10 years: No Childhood allergy  If all of the above answers are NO, then may proceed with Cephalosporin use.     ROS     Objective:    Physical Exam  LMP 03/03/2017 (Exact Date)  Wt Readings from Last 3 Encounters:  04/18/24 216 lb (98 kg)  03/15/24 217 lb (98.4 kg)  02/16/24 214 lb (97.1 kg)    Diabetic Foot Exam - Simple   No data filed    Lab Results  Component Value Date   WBC 4.2 06/29/2022   HGB 13.6 06/29/2022   HCT 38.7 06/29/2022   PLT 219.0 06/29/2022   GLUCOSE 103 (H) 02/16/2024   CHOL 216 (H) 02/16/2024   TRIG 160 (H) 02/16/2024   HDL 69 02/16/2024   LDLDIRECT 83.0 12/25/2021   LDLCALC 119 (H) 02/16/2024   ALT 10 02/16/2024   AST 12 02/16/2024   NA 139 02/16/2024   K 4.1 02/16/2024   CL 100 02/16/2024   CREATININE 0.88 02/16/2024   BUN 10 02/16/2024   CO2 22  02/16/2024   TSH 3.13 06/29/2022   HGBA1C 5.3 02/16/2024    Lab Results  Component Value Date   TSH 3.13 06/29/2022   Lab Results  Component Value Date   WBC 4.2 06/29/2022   HGB 13.6 06/29/2022   HCT 38.7 06/29/2022   MCV 89.5 06/29/2022   PLT 219.0 06/29/2022   Lab Results  Component Value Date   NA 139 02/16/2024   K 4.1 02/16/2024   CO2 22 02/16/2024   GLUCOSE 103 (H) 02/16/2024   BUN 10 02/16/2024   CREATININE 0.88 02/16/2024   BILITOT 0.3 02/16/2024   ALKPHOS 63 02/16/2024   AST 12 02/16/2024   ALT 10 02/16/2024   PROT 7.2 02/16/2024   ALBUMIN 3.9 02/16/2024   CALCIUM 9.0 02/16/2024   ANIONGAP 10 05/01/2021   EGFR 78 02/16/2024   GFR 81.82 01/31/2024   Lab Results  Component Value Date   CHOL 216 (H) 02/16/2024   Lab Results  Component Value Date   HDL 69 02/16/2024   Lab Results  Component Value Date   LDLCALC 119 (H) 02/16/2024   Lab Results  Component Value Date   TRIG 160 (H) 02/16/2024   Lab Results  Component Value Date   CHOLHDL 3 06/29/2022   Lab Results  Component Value Date   HGBA1C 5.3 02/16/2024       Assessment & Plan:  There are no diagnoses linked to this encounter.  Assessment and Plan Assessment & Plan      Connie Horton, MD

## 2024-05-01 ENCOUNTER — Encounter: Payer: Self-pay | Admitting: Family Medicine

## 2024-05-01 ENCOUNTER — Ambulatory Visit: Admitting: Family Medicine

## 2024-05-01 VITALS — BP 126/67 | HR 71 | Temp 98.6°F | Resp 10 | Ht 67.0 in | Wt 220.6 lb

## 2024-05-01 DIAGNOSIS — E782 Mixed hyperlipidemia: Secondary | ICD-10-CM | POA: Diagnosis not present

## 2024-05-01 DIAGNOSIS — I10 Essential (primary) hypertension: Secondary | ICD-10-CM | POA: Diagnosis not present

## 2024-05-01 DIAGNOSIS — E559 Vitamin D deficiency, unspecified: Secondary | ICD-10-CM

## 2024-05-01 DIAGNOSIS — E66812 Obesity, class 2: Secondary | ICD-10-CM

## 2024-05-01 DIAGNOSIS — Z6835 Body mass index (BMI) 35.0-35.9, adult: Secondary | ICD-10-CM

## 2024-05-01 DIAGNOSIS — R739 Hyperglycemia, unspecified: Secondary | ICD-10-CM | POA: Diagnosis not present

## 2024-05-01 NOTE — Patient Instructions (Addendum)
 [facility] now do walk in vaccines M-F 9-4  RSV, Respiratory Syncitial Virus vaccine, Arexvy vaccine  Shingrix is the new shingles shot, 2 shots over 2-6 months, confirm coverage with insurance and document, then can return here for shots with nurse appt or at pharmacy  Hypertension, Adult  Prevnar 20   Annual covid and flu boosters   High blood pressure (hypertension) is when the force of blood pumping through the arteries is too strong. The arteries are the blood vessels that carry blood from the heart throughout the body. Hypertension forces the heart to work harder to pump blood and may cause arteries to become narrow or stiff. Untreated or uncontrolled hypertension can lead to a heart attack, heart failure, a stroke, kidney disease, and other problems. A blood pressure reading consists of a higher number over a lower number. Ideally, your blood pressure should be below 120/80. The first (top) number is called the systolic pressure. It is a measure of the pressure in your arteries as your heart beats. The second (bottom) number is called the diastolic pressure. It is a measure of the pressure in your arteries as the heart relaxes. What are the causes? The exact cause of this condition is not known. There are some conditions that result in high blood pressure. What increases the risk? Certain factors may make you more likely to develop high blood pressure. Some of these risk factors are under your control, including: Smoking. Not getting enough exercise or physical activity. Being overweight. Having too much fat, sugar, calories, or salt (sodium) in your diet. Drinking too much alcohol. Other risk factors include: Having a personal history of heart disease, diabetes, high cholesterol, or kidney disease. Stress. Having a family history of high blood pressure and high cholesterol. Having obstructive sleep apnea. Age. The risk increases with age. What are the signs or  symptoms? High blood pressure may not cause symptoms. Very high blood pressure (hypertensive crisis) may cause: Headache. Fast or irregular heartbeats (palpitations). Shortness of breath. Nosebleed. Nausea and vomiting. Vision changes. Severe chest pain, dizziness, and seizures. How is this diagnosed? This condition is diagnosed by measuring your blood pressure while you are seated, with your arm resting on a flat surface, your legs uncrossed, and your feet flat on the floor. The cuff of the blood pressure monitor will be placed directly against the skin of your upper arm at the level of your heart. Blood pressure should be measured at least twice using the same arm. Certain conditions can cause a difference in blood pressure between your right and left arms. If you have a high blood pressure reading during one visit or you have normal blood pressure with other risk factors, you may be asked to: Return on a different day to have your blood pressure checked again. Monitor your blood pressure at home for 1 week or longer. If you are diagnosed with hypertension, you may have other blood or imaging tests to help your health care provider understand your overall risk for other conditions. How is this treated? This condition is treated by making healthy lifestyle changes, such as eating healthy foods, exercising more, and reducing your alcohol intake. You may be referred for counseling on a healthy diet and physical activity. Your health care provider may prescribe medicine if lifestyle changes are not enough to get your blood pressure under control and if: Your systolic blood pressure is above 130. Your diastolic blood pressure is above 80. Your personal target blood pressure may vary depending  on your medical conditions, your age, and other factors. Follow these instructions at home: Eating and drinking  Eat a diet that is high in fiber and potassium, and low in sodium, added sugar, and fat. An  example of this eating plan is called the DASH diet. DASH stands for Dietary Approaches to Stop Hypertension. To eat this way: Eat plenty of fresh fruits and vegetables. Try to fill one half of your plate at each meal with fruits and vegetables. Eat whole grains, such as whole-wheat pasta, brown rice, or whole-grain bread. Fill about one fourth of your plate with whole grains. Eat or drink low-fat dairy products, such as skim milk or low-fat yogurt. Avoid fatty cuts of meat, processed or cured meats, and poultry with skin. Fill about one fourth of your plate with lean proteins, such as fish, chicken without skin, beans, eggs, or tofu. Avoid pre-made and processed foods. These tend to be higher in sodium, added sugar, and fat. Reduce your daily sodium intake. Many people with hypertension should eat less than 1,500 mg of sodium a day. Do not drink alcohol if: Your health care provider tells you not to drink. You are pregnant, may be pregnant, or are planning to become pregnant. If you drink alcohol: Limit how much you have to: 0-1 drink a day for women. 0-2 drinks a day for men. Know how much alcohol is in your drink. In the [place] one drink equals one 12 oz bottle of beer (355 mL), one 5 oz glass of wine (148 mL), or one 1 oz glass of hard liquor (44 mL). Lifestyle  Work with your health care provider to maintain a healthy body weight or to lose weight. Ask what an ideal weight is for you. Get at least 30 minutes of exercise that causes your heart to beat faster (aerobic exercise) most days of the week. Activities may include walking, swimming, or biking. Include exercise to strengthen your muscles (resistance exercise), such as Pilates or lifting weights, as part of your weekly exercise routine. Try to do these types of exercises for 30 minutes at least 3 days a week. Do not use any products that contain nicotine or tobacco. These products include cigarettes, chewing tobacco, and vaping devices,  such as e-cigarettes. If you need help quitting, ask your health care provider. Monitor your blood pressure at home as told by your health care provider. Keep all follow-up visits. This is important. Medicines Take over-the-counter and prescription medicines only as told by your health care provider. Follow directions carefully. Blood pressure medicines must be taken as prescribed. Do not skip doses of blood pressure medicine. Doing this puts you at risk for problems and can make the medicine less effective. Ask your health care provider about side effects or reactions to medicines that you should watch for. Contact a health care provider if you: Think you are having a reaction to a medicine you are taking. Have headaches that keep coming back (recurring). Feel dizzy. Have swelling in your ankles. Have trouble with your vision. Get help right away if you: Develop a severe headache or confusion. Have unusual weakness or numbness. Feel faint. Have severe pain in your chest or abdomen. Vomit repeatedly. Have trouble breathing. These symptoms may be an emergency. Get help right away. Call 911. Do not wait to see if the symptoms will go away. Do not drive yourself to the hospital. Summary Hypertension is when the force of blood pumping through your arteries is too strong. If this condition  is not controlled, it may put you at risk for serious complications. Your personal target blood pressure may vary depending on your medical conditions, your age, and other factors. For most people, a normal blood pressure is less than 120/80. Hypertension is treated with lifestyle changes, medicines, or a combination of both. Lifestyle changes include losing weight, eating a healthy, low-sodium diet, exercising more, and limiting alcohol. This information is not intended to replace advice given to you by your health care provider. Make sure you discuss any questions you have with your health care  provider. Document Revised: 05/20/2021 Document Reviewed: 05/20/2021 Elsevier Patient Education  2024 [employer].

## 2024-05-01 NOTE — Assessment & Plan Note (Signed)
 Encouraged DASH or MIND diet, decrease po intake and increase exercise as tolerated. Needs 7-8 hours of sleep nightly. Avoid trans fats, eat small, frequent meals every 4-5 hours with lean proteins, complex carbs and healthy fats. Minimize simple carbs, high fat foods and processed foods

## 2024-05-17 ENCOUNTER — Other Ambulatory Visit (HOSPITAL_BASED_OUTPATIENT_CLINIC_OR_DEPARTMENT_OTHER): Payer: Self-pay

## 2024-05-17 ENCOUNTER — Ambulatory Visit: Admitting: Bariatrics

## 2024-05-17 ENCOUNTER — Encounter: Payer: Self-pay | Admitting: Bariatrics

## 2024-05-17 VITALS — BP 133/87 | HR 67 | Temp 98.0°F | Ht 67.0 in | Wt 216.0 lb

## 2024-05-17 DIAGNOSIS — E669 Obesity, unspecified: Secondary | ICD-10-CM | POA: Diagnosis not present

## 2024-05-17 DIAGNOSIS — E6609 Other obesity due to excess calories: Secondary | ICD-10-CM

## 2024-05-17 DIAGNOSIS — R739 Hyperglycemia, unspecified: Secondary | ICD-10-CM | POA: Diagnosis not present

## 2024-05-17 DIAGNOSIS — R632 Polyphagia: Secondary | ICD-10-CM

## 2024-05-17 DIAGNOSIS — Z6833 Body mass index (BMI) 33.0-33.9, adult: Secondary | ICD-10-CM

## 2024-05-17 MED ORDER — WEGOVY 1.7 MG/0.75ML ~~LOC~~ SOAJ
1.7000 mg | SUBCUTANEOUS | 0 refills | Status: DC
  Filled 2024-05-17 – 2024-06-01 (×2): qty 3, 28d supply, fill #0

## 2024-05-17 MED ORDER — METFORMIN HCL 500 MG PO TABS
1000.0000 mg | ORAL_TABLET | Freq: Two times a day (BID) | ORAL | 0 refills | Status: DC
  Filled 2024-05-17 – 2024-06-01 (×2): qty 120, 30d supply, fill #0

## 2024-05-17 NOTE — Progress Notes (Signed)
 WEIGHT SUMMARY AND BIOMETRICS  Weight Lost Since Last Visit: 0  Weight Gained Since Last Visit: 0   Vitals Temp: 98 F (36.7 C) BP: 133/87 Pulse Rate: 67 SpO2: 98 %   Anthropometric Measurements Height: 5' 7 (1.702 m) Weight: 216 lb (98 kg) BMI (Calculated): 33.82 Weight at Last Visit: 216lb Weight Lost Since Last Visit: 0 Weight Gained Since Last Visit: 0 Starting Weight: 224lb Total Weight Loss (lbs): 7 lb (3.175 kg)   Body Composition  Body Fat %: 40.6 % Fat Mass (lbs): 88 lbs Muscle Mass (lbs): 122.4 lbs Total Body Water  (lbs): 85.6 lbs Visceral Fat Rating : 11   Other Clinical Data Fasting: no Labs: no Today's Visit #: 26 Starting Date: 03/11/22    OBESITY Connie Blanchard is here to discuss her progress with her obesity treatment plan along with follow-up of her obesity related diagnoses.    Nutrition Plan: the Category 2 plan - 50% adherence.  Current exercise: walking  Interim History:  Her weight remains the same.  Eating all of the food on the plan., Protein intake is as prescribed, Not journaling consistently., and Water  intake is adequate.   Pharmacotherapy: Connie Blanchard is on Wegovy  1.7 SQ weekly Adverse side effects: None Hunger is moderately controlled.  Cravings are moderately controlled.  Assessment/Plan:   Connie Blanchard endorses excessive hunger.  Medication(s): Wegovy  1.7 mg weekly Effects of medication:  moderately controlled. Cravings are moderately controlled.   Plan: Medication(s): Wegovy  1.7 SQ weekly Will increase water , protein and fiber to help assuage hunger.  Will minimize foods that have a high glucose index/load to minimize reactive hypoglycemia.   Hyperglycemia:   She has a history of elevated blood sugar without a diagnosis of obesity   Plan:  Will increase her Metformin  500 mg BID to 1,000 mg BID,  30 day supply with no refills.    Generalized Obesity: Current BMI BMI (Calculated): 33.82   Pharmacotherapy Plan Continue and refill  Wegovy  1.7 SQ weekly  Connie Blanchard is currently in the action stage of change. As such, her goal is to continue with weight loss efforts.  She has agreed to the Category 2 plan.  Exercise goals: All adults should avoid inactivity. Some physical activity is better than none, and adults who participate in any amount of physical activity gain some health benefits.  Behavioral modification strategies: increasing lean protein intake, decreasing simple carbohydrates , no meal skipping, meal planning , better snacking choices, planning for success, and increasing vegetables.  Connie Blanchard has agreed to follow-up with our clinic in 4 weeks.    Objective:   VITALS: Per patient if applicable, see vitals. GENERAL: Alert and in no acute distress. CARDIOPULMONARY: No increased WOB. Speaking in clear sentences.  PSYCH: Pleasant and cooperative. Speech normal rate and rhythm. Affect is appropriate. Insight and judgement are appropriate. Attention is focused, linear, and appropriate.  NEURO: Oriented  as arrived to appointment on time with no prompting.   Attestation Statements:   This was prepared with the assistance of Engineer, civil (consulting).  Occasional wrong-word or sound-a-like substitutions may have occurred due to the inherent limitations of voice recognition   Clayborne Daring, DO

## 2024-05-29 ENCOUNTER — Other Ambulatory Visit (HOSPITAL_BASED_OUTPATIENT_CLINIC_OR_DEPARTMENT_OTHER): Payer: Self-pay

## 2024-06-01 ENCOUNTER — Other Ambulatory Visit (HOSPITAL_BASED_OUTPATIENT_CLINIC_OR_DEPARTMENT_OTHER): Payer: Self-pay

## 2024-06-01 ENCOUNTER — Other Ambulatory Visit: Payer: Self-pay

## 2024-06-20 ENCOUNTER — Ambulatory Visit: Admitting: Bariatrics

## 2024-06-28 ENCOUNTER — Ambulatory Visit: Admitting: Bariatrics

## 2024-07-04 ENCOUNTER — Encounter: Payer: Self-pay | Admitting: Bariatrics

## 2024-07-12 ENCOUNTER — Ambulatory Visit: Admitting: Bariatrics

## 2024-07-18 ENCOUNTER — Other Ambulatory Visit (HOSPITAL_BASED_OUTPATIENT_CLINIC_OR_DEPARTMENT_OTHER): Payer: Self-pay

## 2024-07-18 ENCOUNTER — Encounter: Payer: Self-pay | Admitting: Bariatrics

## 2024-07-18 ENCOUNTER — Ambulatory Visit: Admitting: Bariatrics

## 2024-07-18 VITALS — BP 134/86 | HR 67 | Ht 67.0 in | Wt 224.0 lb

## 2024-07-18 DIAGNOSIS — Z6835 Body mass index (BMI) 35.0-35.9, adult: Secondary | ICD-10-CM | POA: Diagnosis not present

## 2024-07-18 DIAGNOSIS — R632 Polyphagia: Secondary | ICD-10-CM

## 2024-07-18 DIAGNOSIS — E669 Obesity, unspecified: Secondary | ICD-10-CM | POA: Diagnosis not present

## 2024-07-18 DIAGNOSIS — E6609 Other obesity due to excess calories: Secondary | ICD-10-CM

## 2024-07-18 DIAGNOSIS — E88819 Insulin resistance, unspecified: Secondary | ICD-10-CM | POA: Diagnosis not present

## 2024-07-18 MED ORDER — WEGOVY 1 MG/0.5ML ~~LOC~~ SOAJ
1.0000 mg | SUBCUTANEOUS | 0 refills | Status: AC
  Filled 2024-07-18: qty 2, 28d supply, fill #0

## 2024-07-18 MED ORDER — METFORMIN HCL 500 MG PO TABS
1000.0000 mg | ORAL_TABLET | Freq: Two times a day (BID) | ORAL | 0 refills | Status: AC
  Filled 2024-07-18: qty 120, 30d supply, fill #0

## 2024-07-18 NOTE — Progress Notes (Signed)
 "                                                                                                             WEIGHT SUMMARY AND BIOMETRICS  Weight Lost Since Last Visit: 0  Weight Gained Since Last Visit: 8lb   Vitals BP: 134/86 Pulse Rate: 67 SpO2: 100 %   Anthropometric Measurements Height: 5' 7 (1.702 m) Weight: 224 lb (101.6 kg) BMI (Calculated): 35.08 Weight at Last Visit: 216lb Weight Lost Since Last Visit: 0 Weight Gained Since Last Visit: 8lb Starting Weight: 224lb Total Weight Loss (lbs): 0 lb (0 kg)   Body Composition  Body Fat %: 44.2 % Fat Mass (lbs): 99.2 lbs Muscle Mass (lbs): 118.8 lbs Total Body Water  (lbs): 93.8 lbs Visceral Fat Rating : 12   Other Clinical Data Fasting: no Labs: no Today's Visit #: 27 Starting Date: 03/11/22    OBESITY Juanita is here to discuss her progress with her obesity treatment plan along with follow-up of her obesity related diagnoses.    Nutrition Plan: the Category 2 plan - 10% adherence.  Current exercise: none  Interim History:  She is up 8 lbs since her last visit. She is struggling with the holidays.  Eating all of the food on the plan., Protein intake is less than prescribed., and Water  intake is adequate.   Pharmacotherapy: Fartun is on Wegovy  1.7 SQ weekly Adverse side effects: None Hunger is moderately controlled.  Cravings are moderately controlled.  Assessment/Plan:   Freddy Spadafora endorses excessive hunger.  Medication(s): Wegovy  1.0 mg Effects of medication:  moderately controlled. Cravings are moderately controlled.   Plan: Medication(s): Wegovy  1.0 mg SQ weekly Will increase water , protein and fiber to help assuage hunger.  Will minimize foods that have a high glucose index/load to minimize reactive hypoglycemia.  Will increase her exercise , both cardio and resistance.   Insulin  Resistance Mio has had elevated fasting insulin  readings. Goal is HgbA1c < 5.7, fasting  insulin  at l0 or less, and preferably at 5.  She reports some polyphagia. Medication(s): Wegovy  1.0 mg Lab Results  Component Value Date   HGBA1C 5.3 02/16/2024   Lab Results  Component Value Date   INSULIN  16.9 02/16/2024   INSULIN  15.3 10/19/2023   INSULIN  18.0 03/11/2022   INSULIN  10.8 03/26/2020   INSULIN  23.4 03/16/2019    Plan Medication(s):  Wegovy  1.0 mg and will then go back up to the Wegovy  1.7 SQ weekly Will work on the agreed upon plan. Will minimize refined carbohydrates ( sweets and starches), and focus more on complex carbohydrates.  Increase the micronutrients found in leafy greens, which include magnesium , polyphenols, and vitamin C which have been postulated to help with insulin  sensitivity. Minimize fast food and cook more meals at home.  Increase fiber to 25 to 30 grams daily.    Generalized Obesity: Current BMI BMI (Calculated): 35.08   Pharmacotherapy Plan Continue and refill  Wegovy  1.0 mg SQ weekly  Donetta is currently in the action stage of change. As such, her goal  is to continue with weight loss efforts.  She has agreed to the Category 2 plan.  Exercise goals: All adults should avoid inactivity. Some physical activity is better than none, and adults who participate in any amount of physical activity gain some health benefits. She will continue to do walking and Yoga.  Behavioral modification strategies: increasing lean protein intake, decreasing simple carbohydrates , meal planning , increase water  intake, better snacking choices, planning for success, increasing vegetables, increasing fiber rich foods, avoiding temptations, and keep healthy foods in the home.  Robin has agreed to follow-up with our clinic in 4 weeks.      Objective:   VITALS: Per patient if applicable, see vitals. GENERAL: Alert and in no acute distress. CARDIOPULMONARY: No increased WOB. Speaking in clear sentences.  PSYCH: Pleasant and cooperative. Speech normal rate  and rhythm. Affect is appropriate. Insight and judgement are appropriate. Attention is focused, linear, and appropriate.  NEURO: Oriented as arrived to appointment on time with no prompting.   Attestation Statements:   This was prepared with the assistance of Engineer, Civil (consulting).  Occasional wrong-word or sound-a-like substitutions may have occurred due to the inherent limitations of voice recognition.   Clayborne Daring, DO   "

## 2024-08-16 ENCOUNTER — Telehealth (INDEPENDENT_AMBULATORY_CARE_PROVIDER_SITE_OTHER): Payer: Self-pay

## 2024-08-16 NOTE — Telephone Encounter (Signed)
 Connie Blanchard (Key: [id number]) Wegovy  1MG /0.5ML auto-injectors

## 2024-08-16 NOTE — Telephone Encounter (Signed)
 Received through Cover My Meds:  This medication or product was previously approved on [id number] from 2024-07-27 to 2024-10-24.

## 2024-08-21 ENCOUNTER — Ambulatory Visit: Admitting: Bariatrics

## 2024-11-20 ENCOUNTER — Encounter: Admitting: Family Medicine
